# Patient Record
Sex: Female | Born: 1960
Health system: Southern US, Community
[De-identification: ages and names within clinical notes are randomized; demographics above are authoritative.]

## PROBLEM LIST (undated history)

## (undated) DIAGNOSIS — K2960 Other gastritis without bleeding: Secondary | ICD-10-CM

## (undated) DIAGNOSIS — T4145XA Adverse effect of unspecified anesthetic, initial encounter: Secondary | ICD-10-CM

## (undated) DIAGNOSIS — R600 Localized edema: Secondary | ICD-10-CM

## (undated) DIAGNOSIS — K219 Gastro-esophageal reflux disease without esophagitis: Secondary | ICD-10-CM

## (undated) DIAGNOSIS — T8859XA Other complications of anesthesia, initial encounter: Secondary | ICD-10-CM

## (undated) DIAGNOSIS — G43909 Migraine, unspecified, not intractable, without status migrainosus: Secondary | ICD-10-CM

## (undated) DIAGNOSIS — R7303 Prediabetes: Secondary | ICD-10-CM

## (undated) DIAGNOSIS — G4730 Sleep apnea, unspecified: Secondary | ICD-10-CM

## (undated) DIAGNOSIS — M1990 Unspecified osteoarthritis, unspecified site: Secondary | ICD-10-CM

## (undated) DIAGNOSIS — F419 Anxiety disorder, unspecified: Secondary | ICD-10-CM

## (undated) DIAGNOSIS — R519 Headache, unspecified: Secondary | ICD-10-CM

## (undated) DIAGNOSIS — F329 Major depressive disorder, single episode, unspecified: Secondary | ICD-10-CM

## (undated) DIAGNOSIS — F32A Depression, unspecified: Secondary | ICD-10-CM

## (undated) DIAGNOSIS — E119 Type 2 diabetes mellitus without complications: Secondary | ICD-10-CM

## (undated) HISTORY — DX: Headache, unspecified: R51.9

## (undated) HISTORY — PX: KNEE SURGERY: SHX244

## (undated) HISTORY — PX: JOINT REPLACEMENT: SHX530

## (undated) HISTORY — PX: FOOT SURGERY: SHX648

## (undated) HISTORY — DX: Other gastritis without bleeding: K29.60

## (undated) HISTORY — PX: MENISCUS REPAIR: SHX5179

## (undated) HISTORY — DX: Migraine, unspecified, not intractable, without status migrainosus: G43.909

## (undated) HISTORY — DX: Type 2 diabetes mellitus without complications: E11.9

---
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
## 1994-06-21 HISTORY — PX: ANTERIOR CRUCIATE LIGAMENT REPAIR: SHX115

## 2002-06-21 HISTORY — PX: DILATION AND CURETTAGE OF UTERUS: SHX78

## 2004-06-09 ENCOUNTER — Ambulatory Visit: Payer: Self-pay

## 2004-10-08 ENCOUNTER — Ambulatory Visit: Payer: Self-pay

## 2004-10-30 ENCOUNTER — Ambulatory Visit: Payer: Self-pay | Admitting: Internal Medicine

## 2005-07-20 ENCOUNTER — Ambulatory Visit: Payer: Self-pay | Admitting: Family Medicine

## 2006-07-21 DIAGNOSIS — I10 Essential (primary) hypertension: Secondary | ICD-10-CM | POA: Insufficient documentation

## 2006-07-21 DIAGNOSIS — F339 Major depressive disorder, recurrent, unspecified: Secondary | ICD-10-CM | POA: Insufficient documentation

## 2006-07-21 DIAGNOSIS — G43909 Migraine, unspecified, not intractable, without status migrainosus: Secondary | ICD-10-CM | POA: Insufficient documentation

## 2006-07-21 DIAGNOSIS — E7800 Pure hypercholesterolemia, unspecified: Secondary | ICD-10-CM | POA: Insufficient documentation

## 2006-07-21 DIAGNOSIS — F509 Eating disorder, unspecified: Secondary | ICD-10-CM | POA: Insufficient documentation

## 2006-07-28 DIAGNOSIS — M21619 Bunion of unspecified foot: Secondary | ICD-10-CM | POA: Insufficient documentation

## 2006-08-23 ENCOUNTER — Ambulatory Visit: Payer: Self-pay | Admitting: Family Medicine

## 2007-09-20 ENCOUNTER — Ambulatory Visit: Payer: Self-pay | Admitting: Family Medicine

## 2008-02-28 ENCOUNTER — Emergency Department: Payer: Self-pay | Admitting: Emergency Medicine

## 2008-12-18 DIAGNOSIS — M129 Arthropathy, unspecified: Secondary | ICD-10-CM | POA: Insufficient documentation

## 2010-01-14 LAB — HM PAP SMEAR: HM PAP: NEGATIVE

## 2010-01-19 LAB — LIPID PANEL
CHOLESTEROL: 141 mg/dL (ref 0–200)
HDL: 46 mg/dL (ref 35–70)
LDL Cholesterol: 76 mg/dL
TRIGLYCERIDES: 95 mg/dL (ref 40–160)

## 2010-01-20 ENCOUNTER — Ambulatory Visit: Payer: Self-pay | Admitting: Family Medicine

## 2011-09-13 ENCOUNTER — Ambulatory Visit: Payer: Self-pay | Admitting: Family Medicine

## 2011-10-11 LAB — HM COLONOSCOPY

## 2012-06-05 ENCOUNTER — Ambulatory Visit: Payer: Self-pay | Admitting: Family Medicine

## 2012-12-19 LAB — HEMOGLOBIN A1C: Hemoglobin A1C: 5.7

## 2013-05-24 ENCOUNTER — Ambulatory Visit: Payer: Self-pay | Admitting: Obstetrics and Gynecology

## 2013-05-24 LAB — CBC
HGB: 14.1 g/dL (ref 12.0–16.0)
MCH: 31.1 pg (ref 26.0–34.0)
MCHC: 34.2 g/dL (ref 32.0–36.0)
MCV: 91 fL (ref 80–100)
Platelet: 216 10*3/uL (ref 150–440)
WBC: 6.7 10*3/uL (ref 3.6–11.0)

## 2013-06-01 ENCOUNTER — Ambulatory Visit: Payer: Self-pay | Admitting: Obstetrics & Gynecology

## 2013-06-04 LAB — PATHOLOGY REPORT

## 2013-07-11 ENCOUNTER — Ambulatory Visit: Payer: Self-pay | Admitting: Family Medicine

## 2013-07-11 LAB — HM MAMMOGRAPHY

## 2014-04-15 ENCOUNTER — Ambulatory Visit: Payer: 59 | Admitting: Podiatry

## 2014-04-29 ENCOUNTER — Ambulatory Visit (INDEPENDENT_AMBULATORY_CARE_PROVIDER_SITE_OTHER): Payer: 59

## 2014-04-29 ENCOUNTER — Encounter: Payer: Self-pay | Admitting: Podiatry

## 2014-04-29 ENCOUNTER — Ambulatory Visit (INDEPENDENT_AMBULATORY_CARE_PROVIDER_SITE_OTHER): Payer: 59 | Admitting: Podiatry

## 2014-04-29 VITALS — BP 153/90 | HR 60 | Resp 16 | Ht 64.0 in | Wt 200.0 lb

## 2014-04-29 DIAGNOSIS — Q6652 Congenital pes planus, left foot: Secondary | ICD-10-CM

## 2014-04-29 DIAGNOSIS — M779 Enthesopathy, unspecified: Secondary | ICD-10-CM

## 2014-04-29 DIAGNOSIS — M722 Plantar fascial fibromatosis: Secondary | ICD-10-CM

## 2014-04-29 NOTE — Progress Notes (Signed)
   Subjective:    Patient ID: Rachael Brewer, female    DOB: 1961/04/28, 53 y.o.   MRN: [id number]  HPI Comments: Left foot pain   Foot Pain      Review of Systems     Objective:   Physical Exam: I have reviewed her past mental history medications allergy surgery social history and review of systems. Pulses are strongly palpable. Neurologic sensorium is intact persons once the monofilament. Deep tendon reflexes are intact bilateral muscle strength +5 over 5 dorsiflexion plantar flexors and inverters everters all into the musculature is intact. Orthopedic evaluation demonstrates all joints system to the angle full range of motion without crepitation. She has pain on palpation medial calcaneal tubercle of her left foot. She has pain on palpation to the peroneal tendons as well as the posterior tibial tendon with pes planus. Radiographic evaluation confirms plantar fasciitis.        Assessment & Plan:  Assessment:plantar fasciitis with pes planus and lateral compensatory syndrome left foot.  Plan: Kenalog injection today to the point of maximal tenderness will follow up with her in 1 month to 6 weeks.

## 2014-05-14 ENCOUNTER — Emergency Department: Payer: Self-pay | Admitting: Emergency Medicine

## 2014-06-12 ENCOUNTER — Ambulatory Visit: Payer: Self-pay | Admitting: Podiatry

## 2014-07-01 ENCOUNTER — Ambulatory Visit (INDEPENDENT_AMBULATORY_CARE_PROVIDER_SITE_OTHER): Payer: BLUE CROSS/BLUE SHIELD | Admitting: Podiatry

## 2014-07-01 ENCOUNTER — Encounter: Payer: Self-pay | Admitting: Podiatry

## 2014-07-01 VITALS — BP 112/80 | HR 66 | Resp 16

## 2014-07-01 DIAGNOSIS — M779 Enthesopathy, unspecified: Secondary | ICD-10-CM

## 2014-07-01 NOTE — Progress Notes (Signed)
She states that her left foot is considerably better however there is still some tenderness about the fifth metatarsal head of the left foot and right in here and she points to the insertion point of the tibialis anterior tendon left.  Objective: Pulses are strongly palpable left foot. She has mild tenderness on palpation medially. Tubercle of the left heel pain on palpation fifth metatarsal head left. She also has pain on palpation of the insertion site of the tibialis anterior as well as the tibialis posterior tendons.  Assessment: Insertional posterior tibial tendinitis secondary to plantar fasciitis.  Plan: Injected a small amount of dexamethasone to the point of maximal tenderness at the tibialis anterior insertion site. Discussed ice therapy shoe gear modifications and purchasing a new pair of shoes. She understands this is amenable to it.

## 2014-08-12 ENCOUNTER — Encounter: Payer: Self-pay | Admitting: Podiatry

## 2014-08-12 ENCOUNTER — Ambulatory Visit (INDEPENDENT_AMBULATORY_CARE_PROVIDER_SITE_OTHER): Payer: BLUE CROSS/BLUE SHIELD

## 2014-08-12 ENCOUNTER — Ambulatory Visit (INDEPENDENT_AMBULATORY_CARE_PROVIDER_SITE_OTHER): Payer: BLUE CROSS/BLUE SHIELD | Admitting: Podiatry

## 2014-08-12 VITALS — BP 159/88 | HR 62 | Resp 12

## 2014-08-12 DIAGNOSIS — G5751 Tarsal tunnel syndrome, right lower limb: Secondary | ICD-10-CM

## 2014-08-12 DIAGNOSIS — R52 Pain, unspecified: Secondary | ICD-10-CM

## 2014-08-12 NOTE — Progress Notes (Signed)
   Subjective:    Patient ID: Rachael Brewer, female    DOB: 07-Aug-1960, 54 y.o.   MRN: [id number]  HPI  PT STATED RT TOP OF THE FOOT BEEN SORE FOR 1 MONTH. THE FOOT IS GETTING WORSE ESPECIALLY WHEN FLEXING/ WALKING. TRIED ICE BUT NO HELP.  Review of Systems  Musculoskeletal: Positive for gait problem.  All other systems reviewed and are negative.      Objective:   Physical Exam: I have reviewed her past history medications allergies surgeries and social history. Pulses are palpable bilateral lower extremity. She has pain with a positive Tinel's sign on palpation of the tibial nerve all palpating the posterior tibial tendon. This is primarily in the right foot she does have some lateral compensatory syndrome of the left foot from proximal plantar fasciitis.      Assessment & Plan:  Assessment: Proximal plantar fasciitis bilateral right greater than left. Lateral compensatory syndrome bilaterally. This was resulting in a tarsal tunnellike syndrome in the right foot.  Plan: We discussed the necessity for new orthotics upon her next visit should should a new pair of shoes not alleviate her symptoms entirely. I also injected the tarsal tunnel at the porta pedis today. This was on the right foot only

## 2014-09-11 ENCOUNTER — Ambulatory Visit (INDEPENDENT_AMBULATORY_CARE_PROVIDER_SITE_OTHER): Payer: BLUE CROSS/BLUE SHIELD | Admitting: Podiatry

## 2014-09-11 ENCOUNTER — Encounter: Payer: Self-pay | Admitting: Podiatry

## 2014-09-11 DIAGNOSIS — G5751 Tarsal tunnel syndrome, right lower limb: Secondary | ICD-10-CM | POA: Diagnosis not present

## 2014-09-11 DIAGNOSIS — Q6650 Congenital pes planus, unspecified foot: Secondary | ICD-10-CM

## 2014-09-11 DIAGNOSIS — M722 Plantar fascial fibromatosis: Secondary | ICD-10-CM

## 2014-09-12 NOTE — Progress Notes (Signed)
She presents today complaining of generalized foot pain and discomfort though the areas of greatest concern last time the tarsal tunnel of the right foot in the arch of the left foot is doing much better.  Objective: Vital signs are stable she is alert and oriented 3 no reproducible pain today with exception of 2 small, very small, fibromas to plantar medial aspect of the forefoot bilateral.  Assessment: Fasciitis with a plantar fibroma bilateral with lateral compensatory syndrome.  Plan: She was scanned for another pair orthotics and I will follow up with her once the cement.

## 2014-10-11 NOTE — Op Note (Signed)
PATIENT NAME:  Rachael Brewer, DIFFEE MR#:  [id number] DATE OF BIRTH:  1961-06-10  DATE OF PROCEDURE:  06/01/2013  PREOPERATIVE DIAGNOSIS: Postmenopausal bleeding and intrauterine mass.   POSTOPERATIVE DAIGNOSIS: Postmenopausal bleeding and intrauterine mass.   PROCEDURE PERFORMED: Hysteroscopy, dilation and curettage procedure with myomectomy/excision of mass.   SURGEON: Barnett Applebaum, M.D.   ANESTHESIA: General.   ESTIMATED BLOOD LOSS: Minimal.   COMPLICATIONS: None.   FINDINGS: There is a fundal left-sided of the submucosal swelling or mass. Specimen is obtained by MyoSure extraction of this tissue in question.   DISPOSITION: To the recovery room in stable condition.   TECHNIQUE: The patient is prepped in the usual sterile fashion after adequate anesthesia is obtained in the dorsal lithotomy position. Bladder is drained with a Robinson catheter. Speculum is placed and the anterior lip of the cervix is grasped with a tenaculum. The cervix is gently dilated and the uterus is sounded to 6 cm. A 0 degree hysteroscope is inserted with the above-mentioned findings visualized. Using the MyoSure extraction device, specimen is excised and retrieved and sent to pathology for further review. Excellent hemostasis is noted in the lining of the uterus. The hysteroscope was removed with minimal discrepancy of fluid. The tenaculum was removed with no bleeding noted. The patient goes to the recovery room in stable condition. All sponge, instrument, and needle counts are correct.   ____________________________ R. Barnett Applebaum, MD rph:aw D: 06/01/2013 13:04:05 ET T: 06/01/2013 13:31:32 ET JOB#: [id number]  cc: Glean Salen, MD, <Dictator> Gae Dry MD ELECTRONICALLY SIGNED 06/05/2013 10:06

## 2014-10-14 ENCOUNTER — Ambulatory Visit (INDEPENDENT_AMBULATORY_CARE_PROVIDER_SITE_OTHER): Payer: BLUE CROSS/BLUE SHIELD | Admitting: *Deleted

## 2014-10-14 DIAGNOSIS — G5751 Tarsal tunnel syndrome, right lower limb: Secondary | ICD-10-CM

## 2014-10-14 DIAGNOSIS — M722 Plantar fascial fibromatosis: Secondary | ICD-10-CM

## 2014-10-14 NOTE — Progress Notes (Signed)
Orthotics dispensed. Given written and oral breakin instructions.

## 2014-10-14 NOTE — Patient Instructions (Signed)

## 2014-11-11 ENCOUNTER — Encounter: Payer: Self-pay | Admitting: Podiatry

## 2014-11-11 ENCOUNTER — Ambulatory Visit (INDEPENDENT_AMBULATORY_CARE_PROVIDER_SITE_OTHER): Payer: BLUE CROSS/BLUE SHIELD | Admitting: Podiatry

## 2014-11-11 DIAGNOSIS — M722 Plantar fascial fibromatosis: Secondary | ICD-10-CM

## 2014-11-11 DIAGNOSIS — Q6652 Congenital pes planus, left foot: Secondary | ICD-10-CM | POA: Diagnosis not present

## 2014-11-11 DIAGNOSIS — M7750 Other enthesopathy of unspecified foot: Secondary | ICD-10-CM | POA: Diagnosis not present

## 2014-11-11 DIAGNOSIS — M778 Other enthesopathies, not elsewhere classified: Secondary | ICD-10-CM

## 2014-11-11 DIAGNOSIS — M779 Enthesopathy, unspecified: Secondary | ICD-10-CM

## 2014-11-11 NOTE — Progress Notes (Signed)
She presents today 1 month after picking up her new orthotics. She states that her feet are still painful particularly through the medial longitudinal arch from plantar to dorsal around the lateral aspect of the foot and ankle. She denies any shooting pain that she had in the past and she denies any pain around the ankles that was once consistent with tarsal tunnel syndrome.  Objective: Vital signs are stable to alert and oriented 3. Pulses are strongly palpable bilateral. Moderate to severe edema to the legs and dorsal aspect of the foot today this is relatively new. She states that she will be following up with her primary doctor this week. She has reproducible pain on palpation and frontal plane range of motion of the medial longitudinal arch and midfoot. Minimal tenderness on palpation of the medial calcaneal tubercles bilateral.  Assessment: Capsulitis plantar fasciitis osteoarthritis possible neuropathy bilateral.  Plan: I'm encouraging her to follow-up with her primary doctor to evaluate the edema bilateral. Also request that she follow-up with Dr. Jacqualyn Posey just to see if he can add insight to this chronic pain. I encouraged her to wear her orthotics regularly as she follows up with him.

## 2014-11-15 LAB — TSH: TSH: 2.18 u[IU]/mL (ref 0.41–5.90)

## 2014-11-15 LAB — CBC AND DIFFERENTIAL
HCT: 43 % (ref 36–46)
HEMOGLOBIN: 15.2 g/dL (ref 12.0–16.0)
Platelets: 266 10*3/uL (ref 150–399)
WBC: 6.1 10^3/mL

## 2014-11-15 LAB — BASIC METABOLIC PANEL
Creatinine: 0.9 mg/dL (ref 0.5–1.1)
GLUCOSE: 95 mg/dL

## 2014-11-28 ENCOUNTER — Ambulatory Visit (INDEPENDENT_AMBULATORY_CARE_PROVIDER_SITE_OTHER): Payer: BLUE CROSS/BLUE SHIELD | Admitting: Podiatry

## 2014-11-28 ENCOUNTER — Encounter: Payer: Self-pay | Admitting: Podiatry

## 2014-11-28 ENCOUNTER — Telehealth: Payer: Self-pay | Admitting: Family Medicine

## 2014-11-28 DIAGNOSIS — M722 Plantar fascial fibromatosis: Secondary | ICD-10-CM

## 2014-11-28 DIAGNOSIS — M76829 Posterior tibial tendinitis, unspecified leg: Secondary | ICD-10-CM | POA: Diagnosis not present

## 2014-11-28 DIAGNOSIS — R609 Edema, unspecified: Secondary | ICD-10-CM | POA: Insufficient documentation

## 2014-11-28 NOTE — Telephone Encounter (Signed)
Left message advising pt as below.   Thanks,   -Mickel Baas

## 2014-11-28 NOTE — Telephone Encounter (Signed)
Pt stated she needs to have her labs done again and would like to pick up a lab slip. Pt would like a call when it is ready to be picked up. Thanks TNP

## 2014-11-28 NOTE — Telephone Encounter (Signed)
Ordered sent to lab. Please notify patient.

## 2014-12-02 DIAGNOSIS — M722 Plantar fascial fibromatosis: Secondary | ICD-10-CM | POA: Insufficient documentation

## 2014-12-02 DIAGNOSIS — M76829 Posterior tibial tendinitis, unspecified leg: Secondary | ICD-10-CM | POA: Insufficient documentation

## 2014-12-02 NOTE — Progress Notes (Signed)
Patient ID: Rachael Brewer, female   DOB: 09-20-1960, 54 y.o.   MRN: [id number]  Subjective: 54 year old female presents to the office today for a second opinion on bilateral heel pain. She states that she has had heel pain ongoing for several months without much relief. She does that since the orthotics she does continue to have pain although the orthotist to help some. She has a states that she has pain in the inside aspect of her ankle and his been ongoing approximately the same amount of time. She denies any numbness or tingling. Denies any swelling or redness. The pain does not wake her up at night. Denies any acute injury or trauma to the area change or increase in activity the time of onset of symptoms. She has previously had stretching, icing activities daily. She has also previously had steroid anti-inflammatories medications without resolution. No other complaints at this time.  Objective: AAO x3, NAD DP/PT pulses palpable bilaterally, CRT less than 3 seconds Protective sensation intact with Simms Weinstein monofilament, vibratory sensation intact, Achilles tendon reflex intact; negative tinel sign  There is tenderness to palpation overlying the plantar medial tubercle of the calcaneus to bilateral heels at the insertion of the plantar fascia. There is no pain along the course of plantar fascial within the arch of the foot and the plantar fascia appears intact. There is no pain with lateral compression of the calcaneus or pain the vibratory sensation. No pain on the posterior aspect of the calcaneus or along the course/insertion of the Achilles tendon. There is tenderness to palpation on the course of posterior tibial tendon posterior, inferior to the medial malleolus and the insertion of the navicular tuberosity. There is a decrease in medial arch height upon weightbearing with equinus present. She is able to perform a single and double heel rise although it is uncomfortable. There is no  overlying edema, erythema, increase in warmth. No other areas of tenderness palpation or pain with vibratory sensation to the foot/ankle. MMT 5/5, ROM WNL No open lesions or pre-ulcerative lesions are identified. No pain with calf compression, swelling, warmth, erythema.  Assessment: Bilateral chronic heel pain; likely plantar fasciitis although cannot exclude other etiologies. Unlikely tarsal tunnel   Plan: -X-rays were reviewed with the patient.  -Treatment options discussed including all alternatives, risks, and complications -At this appointment Betha (orthotist) also evaluated the patient to see if the orthotics for fitting appropriately. She also believes of the patient may benefit from an AFO to help stabilize her ankle help take some pressure off the posterior tibial tendon and help the heel pain as well. The patient was open to this and would possibly consider brace in the future after she talks her husband. -Discussed MRI however patient was told off on that at this time. -Recommended physical therapy. Prescription for this was given to the patient. -Continue stretching, icing activities on a consistent basis. Negative barefoot. Wear orthotics. -Follow-up after PT or sooner if any problems are to arise. In the meantime I encouraged her to call the office with any questions, concerns, change in symptoms.

## 2014-12-04 ENCOUNTER — Telehealth: Payer: Self-pay

## 2014-12-04 DIAGNOSIS — R609 Edema, unspecified: Secondary | ICD-10-CM

## 2014-12-04 NOTE — Telephone Encounter (Signed)
Printed new lab slip.

## 2014-12-06 ENCOUNTER — Telehealth: Payer: Self-pay

## 2014-12-06 LAB — COMPREHENSIVE METABOLIC PANEL
A/G RATIO: 1.7 (ref 1.1–2.5)
ALT: 14 IU/L (ref 0–32)
AST: 18 IU/L (ref 0–40)
Albumin: 4.3 g/dL (ref 3.5–5.5)
Alkaline Phosphatase: 54 IU/L (ref 39–117)
BUN/Creatinine Ratio: 24 — ABNORMAL HIGH (ref 9–23)
BUN: 21 mg/dL (ref 6–24)
Bilirubin Total: 0.3 mg/dL (ref 0.0–1.2)
CALCIUM: 9.2 mg/dL (ref 8.7–10.2)
CO2: 24 mmol/L (ref 18–29)
CREATININE: 0.88 mg/dL (ref 0.57–1.00)
Chloride: 107 mmol/L (ref 97–108)
GFR calc Af Amer: 86 mL/min/{1.73_m2} (ref >59)
GFR, EST NON AFRICAN AMERICAN: 74 mL/min/{1.73_m2} (ref >59)
GLOBULIN, TOTAL: 2.6 g/dL (ref 1.5–4.5)
GLUCOSE: 95 mg/dL (ref 65–99)
Potassium: 4.5 mmol/L (ref 3.5–5.2)
Sodium: 143 mmol/L (ref 134–144)
TOTAL PROTEIN: 6.9 g/dL (ref 6.0–8.5)

## 2014-12-06 NOTE — Telephone Encounter (Signed)
-----   Message from Margarita Rana, MD sent at 12/06/2014  9:54 AM EDT ----- Normal labs. Please notify patient. Thanks.

## 2014-12-06 NOTE — Telephone Encounter (Signed)
Advised pt of lab results. Pt verbally acknowledges understanding. Lemont Sitzmann Drozdowski, CMA   

## 2015-01-06 ENCOUNTER — Other Ambulatory Visit: Payer: Self-pay | Admitting: Family Medicine

## 2015-01-06 DIAGNOSIS — R609 Edema, unspecified: Secondary | ICD-10-CM

## 2015-05-28 ENCOUNTER — Other Ambulatory Visit: Payer: Self-pay | Admitting: Family Medicine

## 2015-05-28 DIAGNOSIS — R609 Edema, unspecified: Secondary | ICD-10-CM

## 2015-06-10 ENCOUNTER — Ambulatory Visit (INDEPENDENT_AMBULATORY_CARE_PROVIDER_SITE_OTHER): Payer: BLUE CROSS/BLUE SHIELD | Admitting: Family Medicine

## 2015-06-10 ENCOUNTER — Telehealth: Payer: Self-pay | Admitting: Family Medicine

## 2015-06-10 ENCOUNTER — Encounter: Payer: Self-pay | Admitting: Family Medicine

## 2015-06-10 VITALS — BP 112/60 | HR 76 | Temp 99.1°F | Resp 16 | Wt 223.0 lb

## 2015-06-10 DIAGNOSIS — E669 Obesity, unspecified: Secondary | ICD-10-CM | POA: Insufficient documentation

## 2015-06-10 DIAGNOSIS — R5383 Other fatigue: Secondary | ICD-10-CM | POA: Insufficient documentation

## 2015-06-10 DIAGNOSIS — H6693 Otitis media, unspecified, bilateral: Secondary | ICD-10-CM

## 2015-06-10 DIAGNOSIS — R739 Hyperglycemia, unspecified: Secondary | ICD-10-CM | POA: Insufficient documentation

## 2015-06-10 DIAGNOSIS — S39012A Strain of muscle, fascia and tendon of lower back, initial encounter: Secondary | ICD-10-CM | POA: Insufficient documentation

## 2015-06-10 DIAGNOSIS — J0100 Acute maxillary sinusitis, unspecified: Secondary | ICD-10-CM | POA: Diagnosis not present

## 2015-06-10 DIAGNOSIS — J45909 Unspecified asthma, uncomplicated: Secondary | ICD-10-CM | POA: Insufficient documentation

## 2015-06-10 DIAGNOSIS — K219 Gastro-esophageal reflux disease without esophagitis: Secondary | ICD-10-CM | POA: Insufficient documentation

## 2015-06-10 DIAGNOSIS — N950 Postmenopausal bleeding: Secondary | ICD-10-CM | POA: Insufficient documentation

## 2015-06-10 DIAGNOSIS — H8110 Benign paroxysmal vertigo, unspecified ear: Secondary | ICD-10-CM | POA: Insufficient documentation

## 2015-06-10 DIAGNOSIS — J309 Allergic rhinitis, unspecified: Secondary | ICD-10-CM | POA: Insufficient documentation

## 2015-06-10 DIAGNOSIS — J3489 Other specified disorders of nose and nasal sinuses: Secondary | ICD-10-CM | POA: Insufficient documentation

## 2015-06-10 MED ORDER — PREDNISONE 10 MG PO TABS: ORAL_TABLET | ORAL | Status: DC

## 2015-06-10 MED ORDER — PREDNISONE 10 MG PO TABS
ORAL_TABLET | ORAL | Status: DC
Start: 2015-06-10 — End: 2015-06-10

## 2015-06-10 MED ORDER — CLARITHROMYCIN 500 MG PO TABS: 500.0000 mg | ORAL_TABLET | Freq: Two times a day (BID) | ORAL | Status: DC

## 2015-06-10 NOTE — Progress Notes (Signed)
Patient ID: Rachael Brewer, female   DOB: [birth year], 54 y.o.   MRN: [id number]          Patient: Rachael Brewer Female    DOB: 12-19-1960   54 y.o.   MRN: [id number] Visit Date: 06/10/2015  Today's Provider: Margarita Rana, MD   Chief Complaint  Patient presents with  . Sinusitis   Subjective:    Sinusitis This is a new problem. The current episode started 1 to 4 weeks ago (over a week ago.  Everyone in house is sick. ). The problem has been gradually worsening since onset. There has been no fever. Associated symptoms include congestion, coughing, ear pain, headaches, sinus pressure and a sore throat. Pertinent negatives include no chills, diaphoresis, shortness of breath or sneezing. (Has had some wheezing. Has been using her inhaler.  Also, has been having some ear pain.  )       Allergies  Allergen Reactions  . Sulfa Antibiotics Anaphylaxis  . Penicillins Hives  . Sumatriptan    Previous Medications   ASPIRIN-ACETAMINOPHEN-CAFFEINE (EXCEDRIN MIGRAINE) 250-250-65 MG TABLET    Take by mouth.   AZELASTINE HCL 0.15 % SOLN    Place into the nose.   BACLOFEN (LIORESAL) 10 MG TABLET       CHOLECALCIFEROL 1000 UNITS TABLET    Take by mouth.   ESCITALOPRAM (LEXAPRO) 5 MG TABLET    Take by mouth.   FLUTICASONE (FLONASE) 50 MCG/ACT NASAL SPRAY    Place into the nose.   GABAPENTIN (NEURONTIN) 600 MG TABLET       OMEPRAZOLE 20 MG TBEC    Take by mouth.   POTASSIUM CHLORIDE (MICRO-K) 10 MEQ CR CAPSULE    take 1 capsule by mouth once daily   RIZATRIPTAN (MAXALT) 10 MG TABLET       TOPIRAMATE (TOPAMAX) 200 MG TABLET       TORSEMIDE (DEMADEX) 20 MG TABLET    take 1 tablet by mouth once daily   VENTOLIN HFA 108 (90 BASE) MCG/ACT INHALER        Review of Systems  Constitutional: Positive for fatigue. Negative for fever, chills, diaphoresis, activity change, appetite change and unexpected weight change.  HENT: Positive for congestion, ear pain, nosebleeds, postnasal drip, sinus  pressure, sore throat and tinnitus. Negative for ear discharge, rhinorrhea, sneezing, trouble swallowing and voice change.   Eyes: Negative for photophobia, pain, discharge, redness, itching and visual disturbance.  Respiratory: Positive for cough and wheezing. Negative for apnea, choking, chest tightness, shortness of breath and stridor.   Gastrointestinal: Negative.   Neurological: Positive for headaches. Negative for dizziness and light-headedness.    Social History  Substance Use Topics  . Smoking status: Former Smoker    Quit date: 06/21/1999  . Smokeless tobacco: Never Used  . Alcohol Use: 0.0 oz/week    0 Standard drinks or equivalent per week     Comment: occasional   Objective:   BP 112/60 mmHg  Pulse 76  Temp(Src) 99.1 F (37.3 C) (Oral)  Resp 16  Wt 223 lb (101.152 kg)  Physical Exam  HENT:  Head: Normocephalic and atraumatic.  Right Ear: Tympanic membrane is erythematous and bulging.  Left Ear: Tympanic membrane is erythematous and bulging.  Nose: Mucosal edema present.  Mouth/Throat: Uvula is midline.  Cardiovascular: Normal rate, regular rhythm and normal heart sounds.   Pulmonary/Chest: Effort normal and breath sounds normal.  Psychiatric: She has a normal mood and affect. Her behavior is normal. Judgment and thought content  normal.      Assessment & Plan:      1. Acute maxillary sinusitis, recurrence not specified New problem. Does have history of asthma.  Will continue inhaler, add prednisone due to wheezing and start antibiotic.  - predniSONE (DELTASONE) 10 MG tablet; 6 Tablets for one day 5 Tablets for one day 4 Tablets for one day 3 Tablets for one day 2 Tablets for one day 1 Tablet for one day  Dispense: 21 tablet; Refill: 0  2. Acute ear infection, bilateral New problem. Worsening.  Will treat.  Patient instructed to call back if condition worsens or does not improve.    - clarithromycin (BIAXIN) 500 MG tablet; Take 1 tablet (500 mg total) by mouth 2  (two) times daily.  Dispense: 20 tablet; Refill: 0  Patient was seen and examined by Jerrell Belfast, MD, and note scribed by Ashley Royalty, CMA.  I have reviewed the document for accuracy and completeness and I agree with above. - Jerrell Belfast, MD      Margarita Rana, MD  [facility]

## 2015-06-10 NOTE — Telephone Encounter (Signed)
Called pt and scheduled grandson for this afternoon with Dr. Venia Minks. Thanks TNP

## 2015-06-10 NOTE — Telephone Encounter (Signed)
Ok to schedule. Thanks

## 2015-06-10 NOTE — Telephone Encounter (Signed)
Pt now has custody of her 55 year old grandson. Insurance : Medicaid. Name: Rachael Brewer. DOB: 07/27/2008. Pt is coming in this afternoon for an acute visit and would like to make an appt for her grandson because he has cough & congestion as well. You have a 3:30 same day open and your 3:45 is on hold. Can I schedule grandson for this afternoon to establish and treat for acute visit? Please advise. Thanks TNP

## 2015-07-28 ENCOUNTER — Other Ambulatory Visit: Payer: Self-pay

## 2015-07-28 DIAGNOSIS — R609 Edema, unspecified: Secondary | ICD-10-CM

## 2015-07-28 MED ORDER — TORSEMIDE 20 MG PO TABS: 20.0000 mg | ORAL_TABLET | Freq: Every day | ORAL | Status: DC

## 2015-07-28 NOTE — Telephone Encounter (Signed)
Pharmacy requesting refill. Patient's last OV was 06/10/2015. Med last filled on 01/06/2015 #30 with 5 refills.

## 2015-10-06 DIAGNOSIS — F331 Major depressive disorder, recurrent, moderate: Secondary | ICD-10-CM | POA: Diagnosis not present

## 2015-10-28 DIAGNOSIS — M791 Myalgia: Secondary | ICD-10-CM | POA: Diagnosis not present

## 2015-10-28 DIAGNOSIS — G43019 Migraine without aura, intractable, without status migrainosus: Secondary | ICD-10-CM | POA: Diagnosis not present

## 2015-10-28 DIAGNOSIS — G518 Other disorders of facial nerve: Secondary | ICD-10-CM | POA: Diagnosis not present

## 2015-10-28 DIAGNOSIS — G43719 Chronic migraine without aura, intractable, without status migrainosus: Secondary | ICD-10-CM | POA: Diagnosis not present

## 2015-10-28 DIAGNOSIS — M542 Cervicalgia: Secondary | ICD-10-CM | POA: Diagnosis not present

## 2015-10-28 DIAGNOSIS — G43839 Menstrual migraine, intractable, without status migrainosus: Secondary | ICD-10-CM | POA: Diagnosis not present

## 2015-11-04 DIAGNOSIS — F331 Major depressive disorder, recurrent, moderate: Secondary | ICD-10-CM | POA: Diagnosis not present

## 2015-12-11 DIAGNOSIS — F331 Major depressive disorder, recurrent, moderate: Secondary | ICD-10-CM | POA: Diagnosis not present

## 2015-12-15 ENCOUNTER — Encounter: Payer: Self-pay | Admitting: Family Medicine

## 2015-12-15 ENCOUNTER — Ambulatory Visit (INDEPENDENT_AMBULATORY_CARE_PROVIDER_SITE_OTHER): Payer: BLUE CROSS/BLUE SHIELD | Admitting: Family Medicine

## 2015-12-15 VITALS — BP 122/72 | HR 72 | Temp 98.1°F | Resp 16 | Ht 64.0 in | Wt 233.0 lb

## 2015-12-15 DIAGNOSIS — R609 Edema, unspecified: Secondary | ICD-10-CM

## 2015-12-15 DIAGNOSIS — I10 Essential (primary) hypertension: Secondary | ICD-10-CM | POA: Diagnosis not present

## 2015-12-15 DIAGNOSIS — R739 Hyperglycemia, unspecified: Secondary | ICD-10-CM | POA: Diagnosis not present

## 2015-12-15 DIAGNOSIS — R0683 Snoring: Secondary | ICD-10-CM

## 2015-12-15 DIAGNOSIS — Z0000 Encounter for general adult medical examination without abnormal findings: Secondary | ICD-10-CM

## 2015-12-15 DIAGNOSIS — Z124 Encounter for screening for malignant neoplasm of cervix: Secondary | ICD-10-CM | POA: Diagnosis not present

## 2015-12-15 DIAGNOSIS — E7800 Pure hypercholesterolemia, unspecified: Secondary | ICD-10-CM

## 2015-12-15 DIAGNOSIS — Z1159 Encounter for screening for other viral diseases: Secondary | ICD-10-CM | POA: Diagnosis not present

## 2015-12-15 DIAGNOSIS — Z1239 Encounter for other screening for malignant neoplasm of breast: Secondary | ICD-10-CM

## 2015-12-15 LAB — POCT URINALYSIS DIPSTICK
BILIRUBIN UA: NEGATIVE
Glucose, UA: NEGATIVE
KETONES UA: NEGATIVE
LEUKOCYTES UA: NEGATIVE
Nitrite, UA: NEGATIVE
PROTEIN UA: NEGATIVE
RBC UA: NEGATIVE
SPEC GRAV UA: 1.01
Urobilinogen, UA: 0.2
pH, UA: 7.5

## 2015-12-15 MED ORDER — POTASSIUM CHLORIDE ER 10 MEQ PO CPCR: 20.0000 meq | ORAL_CAPSULE | Freq: Every day | ORAL | Status: DC

## 2015-12-15 MED ORDER — TORSEMIDE 20 MG PO TABS: 40.0000 mg | ORAL_TABLET | Freq: Once | ORAL | Status: DC

## 2015-12-15 NOTE — Patient Instructions (Signed)
Please call the [facility] at [facility] to schedule this at [phone]   

## 2015-12-15 NOTE — Progress Notes (Signed)
Patient: Rachael Brewer, Female    DOB: August 12, 1960, 55 y.o.   MRN: [id number] Visit Date: 12/15/2015  Today's Provider: Margarita Rana, MD   Chief Complaint  Patient presents with  . Annual Exam   Subjective:    Annual physical exam Rachael Brewer is a 55 y.o. female who presents today for health maintenance and complete physical. She feels well. She reports exercising none. She reports she is sleeping fairly well. 01/14/10 Pap-neg; HPV-neg 07/11/13 Mammogram-BI-RADS 1 ----------------------------------------------------------------- Edema: Patient complains of edema. The location of the edema is lower leg(s) bilateral.  The edema has been moderate.  Onset of symptoms was several years ago, gradually worsening since that time. The edema is present all day and and worse in the evening. The patient states the problem is long-standing.  The swelling has been aggravated by increased salt intake, relieved by low-salt diet, and been associated with nothing. Cardiac risk factors include none. Patient reports taking Torsemide 20 mg daily.    Review of Systems  Constitutional: Positive for fatigue.  HENT: Positive for nosebleeds.   Eyes: Negative.   Respiratory: Positive for apnea and wheezing.   Cardiovascular: Positive for leg swelling.  Gastrointestinal: Negative.   Endocrine: Negative.   Genitourinary: Negative.   Musculoskeletal: Positive for arthralgias.  Skin: Negative.   Allergic/Immunologic: Negative.   Neurological: Positive for headaches.  Hematological: Negative.   Psychiatric/Behavioral: Negative.     Social History      She  reports that she quit smoking about 16 years ago. She has never used smokeless tobacco. She reports that she drinks alcohol. She reports that she does not use illicit drugs.       Social History   Social History  . Marital Status: Married    Spouse Name: N/A  . Number of Children: N/A  . Years of Education: N/A   Social History  Main Topics  . Smoking status: Former Smoker    Quit date: 06/21/1999  . Smokeless tobacco: Never Used  . Alcohol Use: 0.0 oz/week    0 Standard drinks or equivalent per week     Comment: occasional  . Drug Use: No  . Sexual Activity: Not Asked   Other Topics Concern  . None   Social History Narrative    Past Medical History  Diagnosis Date  . Migraines      Patient Active Problem List   Diagnosis Date Noted  . AB (asthmatic bronchitis) 06/10/2015  . Adult-onset obesity 06/10/2015  . Allergic rhinitis 06/10/2015  . Benign paroxysmal positional nystagmus 06/10/2015  . Fatigue 06/10/2015  . Acid reflux 06/10/2015  . Blood glucose elevated 06/10/2015  . Lesion of nose 06/10/2015  . Low back strain 06/10/2015  . Hemorrhage, postmenopausal 06/10/2015  . Plantar fasciitis 12/02/2014  . Posterior tibial tendonitis 12/02/2014  . Edema 11/28/2014  . Arthropathia 12/18/2008  . Bunion 07/28/2006  . Clinical depression 07/21/2006  . Appetite disorder 07/21/2006  . Hypercholesteremia 07/21/2006  . BP (high blood pressure) 07/21/2006  . Headache, migraine 07/21/2006    Past Surgical History  Procedure Laterality Date  . Knee surgery Right   . Foot surgery    . Dilation and curettage of uterus  2004  . Anterior cruciate ligament repair Right 1996    Family History        Family Status  Relation Status Death Age  . Mother Alive   . Father Alive   . Brother Alive  Her family history includes Diabetes in her brother and other; Glaucoma in her other; Hypertension in her brother and other.    Allergies  Allergen Reactions  . Sulfa Antibiotics Anaphylaxis  . Penicillins Hives  . Sumatriptan     Current Meds  Medication Sig  . aspirin-acetaminophen-caffeine (EXCEDRIN MIGRAINE) 250-250-65 MG tablet Take by mouth.  . Azelastine HCl 0.15 % SOLN Place into the nose.  . baclofen (LIORESAL) 10 MG tablet Take 10 mg by mouth as needed.   . Cholecalciferol 1000  UNITS tablet Take by mouth.  . fluticasone (FLONASE) 50 MCG/ACT nasal spray Place into the nose.  . potassium chloride (MICRO-K) 10 MEQ CR capsule Take 2 capsules (20 mEq total) by mouth daily.  Marland Kitchen torsemide (DEMADEX) 20 MG tablet Take 2 tablets (40 mg total) by mouth once.  . VENTOLIN HFA 108 (90 BASE) MCG/ACT inhaler   . [DISCONTINUED] potassium chloride (MICRO-K) 10 MEQ CR capsule take 1 capsule by mouth once daily  . [DISCONTINUED] torsemide (DEMADEX) 20 MG tablet Take 1 tablet (20 mg total) by mouth daily.    Patient Care Team: Margarita Rana, MD as PCP - General (Family Medicine)     Objective:   Vitals: BP 122/72 mmHg  Pulse 72  Temp(Src) 98.1 F (36.7 C) (Oral)  Resp 16  Ht 5\' 4"  (1.626 m)  Wt 233 lb (105.688 kg)  BMI 39.97 kg/m2  LMP  (LMP Unknown)   Physical Exam  Constitutional: She is oriented to person, place, and time. She appears well-developed and well-nourished.  HENT:  Head: Normocephalic and atraumatic.  Right Ear: Tympanic membrane, external ear and ear canal normal.  Left Ear: Tympanic membrane, external ear and ear canal normal.  Nose: Nose normal.  Mouth/Throat: Uvula is midline, oropharynx is clear and moist and mucous membranes are normal.  Eyes: Conjunctivae, EOM and lids are normal. Pupils are equal, round, and reactive to light.  Neck: Trachea normal and normal range of motion. Neck supple. Carotid bruit is not present. No thyroid mass and no thyromegaly present.  Cardiovascular: Normal rate, regular rhythm and normal heart sounds.   Pulmonary/Chest: Effort normal and breath sounds normal.  Abdominal: Soft. Normal appearance and bowel sounds are normal. There is no hepatosplenomegaly. There is no tenderness.  Genitourinary: No breast swelling, tenderness or discharge.  Musculoskeletal: Normal range of motion.  Lymphadenopathy:    She has no cervical adenopathy.    She has no axillary adenopathy.  Neurological: She is alert and oriented to person,  place, and time. She has normal strength. No cranial nerve deficit.  Skin: Skin is warm, dry and intact.  Psychiatric: She has a normal mood and affect. Her speech is normal and behavior is normal. Judgment and thought content normal. Cognition and memory are normal.     Depression Screen PHQ 2/9 Scores 12/15/2015  PHQ - 2 Score 3  PHQ- 9 Score 12      Assessment & Plan:     Routine Health Maintenance and Physical Exam  Exercise Activities and Dietary recommendations Goals    . Exercise 150 minutes per week (moderate activity)       Immunization History  Administered Date(s) Administered  . Td 07/22/2000   --------------------------------------------------------------------  1. Annual physical exam Stable. Patient advised to continue eating healthy and exercise daily. - POCT urinalysis dipstick  2. Edema, unspecified type Recurrent. Worsening. Toresmide increased to 40 mg daily and potassium chloride to 20 meq daily as below. Patient to schedule follow-up in 2 weeks  with Nechama Guard. Follow-up on medication and labs. - torsemide (DEMADEX) 20 MG tablet; Take 2 tablets (40 mg total) by mouth once.  Dispense: 60 tablet; Refill: 5 - potassium chloride (MICRO-K) 10 MEQ CR capsule; Take 2 capsules (20 mEq total) by mouth daily.  Dispense: 60 capsule; Refill: 5  3. Cervical cancer screening F/U pending lab report. - Pap IG and HPV (high risk) DNA detection  4. Breast cancer screening - MM DIGITAL SCREENING BILATERAL; Future  5. Essential hypertension F/U pending lab report. - CBC with Differential/Platelet - Comprehensive metabolic panel  6. Need for hepatitis C screening test Patient seen and examined by Dr. Jerrell Belfast, and note scribed by Philbert Riser. Dimas, CMA. - Hepatitis C antibody  7. Hypercholesteremia F/U pending lab report. - Lipid Panel With LDL/HDL Ratio - TSH  8. Blood glucose elevated F/U pending lab report. - Hemoglobin A1c  9.  Snoring Epworth sleepiness scale total score 14.   Gave patient ESS to show provider at follow up to order sleep study.  Patient advised to follow-up in 2 weeks with Grace Bushy.    Patient seen and examined by Dr. Jerrell Belfast, and note scribed by Philbert Riser. Dimas, CMA.  I have reviewed the document for accuracy and completeness and I agree with above. - Jerrell Belfast, MD   Margarita Rana, MD  [facility]

## 2015-12-16 DIAGNOSIS — Z1159 Encounter for screening for other viral diseases: Secondary | ICD-10-CM | POA: Diagnosis not present

## 2015-12-16 DIAGNOSIS — E7800 Pure hypercholesterolemia, unspecified: Secondary | ICD-10-CM | POA: Diagnosis not present

## 2015-12-16 DIAGNOSIS — I10 Essential (primary) hypertension: Secondary | ICD-10-CM | POA: Diagnosis not present

## 2015-12-16 DIAGNOSIS — R739 Hyperglycemia, unspecified: Secondary | ICD-10-CM | POA: Diagnosis not present

## 2015-12-17 ENCOUNTER — Telehealth: Payer: Self-pay

## 2015-12-17 LAB — CBC WITH DIFFERENTIAL/PLATELET
BASOS: 1 %
Basophils Absolute: 0 10*3/uL (ref 0.0–0.2)
EOS (ABSOLUTE): 0.2 10*3/uL (ref 0.0–0.4)
Eos: 4 %
HEMOGLOBIN: 14.4 g/dL (ref 11.1–15.9)
Hematocrit: 42.3 % (ref 34.0–46.6)
IMMATURE GRANS (ABS): 0 10*3/uL (ref 0.0–0.1)
Immature Granulocytes: 0 %
LYMPHS ABS: 1.8 10*3/uL (ref 0.7–3.1)
LYMPHS: 34 %
MCH: 31.5 pg (ref 26.6–33.0)
MCHC: 34 g/dL (ref 31.5–35.7)
MCV: 93 fL (ref 79–97)
MONOCYTES: 9 %
Monocytes Absolute: 0.5 10*3/uL (ref 0.1–0.9)
NEUTROS ABS: 2.9 10*3/uL (ref 1.4–7.0)
Neutrophils: 52 %
Platelets: 260 10*3/uL (ref 150–379)
RBC: 4.57 x10E6/uL (ref 3.77–5.28)
RDW: 13.9 % (ref 12.3–15.4)
WBC: 5.4 10*3/uL (ref 3.4–10.8)

## 2015-12-17 LAB — COMPREHENSIVE METABOLIC PANEL
A/G RATIO: 1.5 (ref 1.2–2.2)
ALBUMIN: 4 g/dL (ref 3.5–5.5)
ALK PHOS: 48 IU/L (ref 39–117)
ALT: 15 IU/L (ref 0–32)
AST: 20 IU/L (ref 0–40)
BILIRUBIN TOTAL: 0.3 mg/dL (ref 0.0–1.2)
BUN / CREAT RATIO: 16 (ref 9–23)
BUN: 15 mg/dL (ref 6–24)
CHLORIDE: 105 mmol/L (ref 96–106)
CO2: 21 mmol/L (ref 18–29)
Calcium: 9.2 mg/dL (ref 8.7–10.2)
Creatinine, Ser: 0.94 mg/dL (ref 0.57–1.00)
GFR calc non Af Amer: 68 mL/min/{1.73_m2} (ref >59)
GFR, EST AFRICAN AMERICAN: 78 mL/min/{1.73_m2} (ref >59)
GLOBULIN, TOTAL: 2.6 g/dL (ref 1.5–4.5)
Glucose: 98 mg/dL (ref 65–99)
POTASSIUM: 4.1 mmol/L (ref 3.5–5.2)
SODIUM: 144 mmol/L (ref 134–144)
TOTAL PROTEIN: 6.6 g/dL (ref 6.0–8.5)

## 2015-12-17 LAB — PAP IG AND HPV HIGH-RISK
HPV, high-risk: NEGATIVE
PAP Smear Comment: 0

## 2015-12-17 LAB — HEPATITIS C ANTIBODY: Hep C Virus Ab: <0.1 s/co ratio (ref 0.0–0.9)

## 2015-12-17 LAB — LIPID PANEL WITH LDL/HDL RATIO
Cholesterol, Total: 138 mg/dL (ref 100–199)
HDL: 39 mg/dL — ABNORMAL LOW (ref >39)
LDL CALC: 69 mg/dL (ref 0–99)
LDL/HDL RATIO: 1.8 ratio (ref 0.0–3.2)
TRIGLYCERIDES: 151 mg/dL — AB (ref 0–149)
VLDL Cholesterol Cal: 30 mg/dL (ref 5–40)

## 2015-12-17 LAB — HEMOGLOBIN A1C
Est. average glucose Bld gHb Est-mCnc: 114 mg/dL
HEMOGLOBIN A1C: 5.6 % (ref 4.8–5.6)

## 2015-12-17 LAB — TSH: TSH: 2.58 u[IU]/mL (ref 0.450–4.500)

## 2015-12-17 NOTE — Telephone Encounter (Signed)
-----   Message from Margarita Rana, MD sent at 12/17/2015  1:52 PM EDT ----- Pap is normal. Please notify patient.

## 2015-12-17 NOTE — Telephone Encounter (Signed)
lmtcb Emily Drozdowski, CMA  

## 2015-12-19 NOTE — Telephone Encounter (Signed)
LMTCB 12/19/2015  Thanks,   -Mickel Baas

## 2015-12-24 NOTE — Telephone Encounter (Signed)
Patient advised as below.  

## 2015-12-25 DIAGNOSIS — F331 Major depressive disorder, recurrent, moderate: Secondary | ICD-10-CM | POA: Diagnosis not present

## 2016-01-01 ENCOUNTER — Encounter: Payer: Self-pay | Admitting: Physician Assistant

## 2016-01-01 ENCOUNTER — Ambulatory Visit (INDEPENDENT_AMBULATORY_CARE_PROVIDER_SITE_OTHER): Payer: BLUE CROSS/BLUE SHIELD | Admitting: Physician Assistant

## 2016-01-01 VITALS — BP 120/68 | HR 66 | Temp 98.0°F | Resp 16 | Wt 231.4 lb

## 2016-01-01 DIAGNOSIS — R0683 Snoring: Secondary | ICD-10-CM

## 2016-01-01 DIAGNOSIS — I872 Venous insufficiency (chronic) (peripheral): Secondary | ICD-10-CM | POA: Insufficient documentation

## 2016-01-01 DIAGNOSIS — M2142 Flat foot [pes planus] (acquired), left foot: Secondary | ICD-10-CM | POA: Diagnosis not present

## 2016-01-01 DIAGNOSIS — R0681 Apnea, not elsewhere classified: Secondary | ICD-10-CM | POA: Insufficient documentation

## 2016-01-01 DIAGNOSIS — Z6839 Body mass index (BMI) 39.0-39.9, adult: Secondary | ICD-10-CM

## 2016-01-01 DIAGNOSIS — R600 Localized edema: Secondary | ICD-10-CM | POA: Insufficient documentation

## 2016-01-01 DIAGNOSIS — M2141 Flat foot [pes planus] (acquired), right foot: Secondary | ICD-10-CM | POA: Insufficient documentation

## 2016-01-01 DIAGNOSIS — E669 Obesity, unspecified: Secondary | ICD-10-CM | POA: Insufficient documentation

## 2016-01-01 NOTE — Progress Notes (Signed)
Patient: Rachael Brewer Female    DOB: December 29, 1960   55 y.o.   MRN: [id number] Visit Date: 01/01/2016  Today's Provider: Mar Daring, PA-C   Chief Complaint  Patient presents with  . Edema  . Apnea   Subjective:    HPI Edema: Patient here following up on edema. The location of the edema is lower leg(s) bilateral.  The edema has been moderate.  Onset of The edema is present all day and worst in the evening.. The patient states the problem is worsened by long-standing and sitting for a long time. The swelling has been aggravated by hot weather and increased salt intake, relieved by elevation of involved area, and been associated with nothing. Cardiac risk factors include none. Torsemide was increased in the last office visit. She has not noticed much improvement in the symptoms with the increase. She does have visible and palpable varicosities. She does report her legs aching and having a dull throb by the end of the day. She reports her grandmother and mother also both having varicose veins and problems with their legs swelling as well. She states her varicose veins and spider veins have been present since she was 17 and have progressively worsened. She has never used surgical grade compression stockings, only OTC compression stockings without relief.  Apnea: Patient is here for her 2 week follow-up. Patient generally gets 6 or 7 hours of sleep per night.She reports she sleeps a lot, but also wakes up a lot. (Broken sleep). She reports that when she wakes up in the mornings she doesn't feel rested .Snoring of moderate severity. Apneic episodes have not been identified. Patient has had tonsillectomy. She is obese with BMI 39.  She does also have chronic bilateral ankle pain. She does not equate to the swelling. She has had this for longer time period and has had surgery on her feet. She did have an orthopedic that told her there was nothing else he could do. She has scheduled an  appt with a sports medicine orthopedic foot and ankle specialist to see if they could offer any other options.     Allergies  Allergen Reactions  . Sulfa Antibiotics Anaphylaxis  . Penicillins Hives  . Sumatriptan    Current Meds  Medication Sig  . aspirin-acetaminophen-caffeine (EXCEDRIN MIGRAINE) 250-250-65 MG tablet Take by mouth.  . Azelastine HCl 0.15 % SOLN Place into the nose.  . baclofen (LIORESAL) 10 MG tablet Take 10 mg by mouth as needed.   . Cholecalciferol 1000 UNITS tablet Take by mouth.  . escitalopram (LEXAPRO) 5 MG tablet Take 5 mg by mouth daily.   . fluticasone (FLONASE) 50 MCG/ACT nasal spray Place into the nose.  . gabapentin (NEURONTIN) 600 MG tablet Take 600 mg by mouth 2 (two) times daily.   Marland Kitchen omeprazole (PRILOSEC) 40 MG capsule take 1 capsule by mouth once daily  30 MINUTES PRIOR TO EVENING MEAL  . potassium chloride (MICRO-K) 10 MEQ CR capsule Take 2 capsules (20 mEq total) by mouth daily.  . rizatriptan (MAXALT) 10 MG tablet Take 1 tablet by mouth as needed.  . topiramate (TOPAMAX) 200 MG tablet Take 300 mg by mouth daily.   Marland Kitchen torsemide (DEMADEX) 20 MG tablet Take 2 tablets (40 mg total) by mouth once.  . VENTOLIN HFA 108 (90 BASE) MCG/ACT inhaler     Review of Systems  Constitutional: Positive for fatigue. Negative for fever, chills and unexpected weight change.  Respiratory: Negative for  cough, chest tightness and shortness of breath.   Cardiovascular: Positive for leg swelling. Negative for chest pain and palpitations.  Gastrointestinal: Negative for nausea, vomiting and abdominal pain.  Musculoskeletal: Positive for myalgias, joint swelling and arthralgias. Negative for back pain and gait problem.  Skin: Negative for color change.  Neurological: Positive for headaches.    Social History  Substance Use Topics  . Smoking status: Former Smoker    Quit date: 06/21/1999  . Smokeless tobacco: Never Used  . Alcohol Use: 0.0 oz/week    0 Standard  drinks or equivalent per week     Comment: occasional   Objective:   BP 120/68 mmHg  Pulse 66  Temp(Src) 98 F (36.7 C) (Oral)  Resp 16  Wt 231 lb 6.4 oz (104.962 kg)  LMP  (LMP Unknown)  Physical Exam  Constitutional: She appears well-developed and well-nourished. No distress.  Cardiovascular: Normal rate, regular rhythm and normal heart sounds.  Exam reveals no gallop and no friction rub.   No murmur heard. Pulses:      Dorsalis pedis pulses are 2+ on the right side, and 2+ on the left side.       Posterior tibial pulses are 2+ on the right side, and 2+ on the left side.  Pulmonary/Chest: Effort normal and breath sounds normal. No respiratory distress. She has no wheezes. She has no rales.  Musculoskeletal: She exhibits edema (non-pitting).       Right ankle: She exhibits swelling. She exhibits normal range of motion, no deformity and normal pulse. No tenderness. Achilles tendon normal.       Left ankle: She exhibits swelling. She exhibits normal range of motion, no deformity and normal pulse. No tenderness. Achilles tendon normal.  Patient reports it hurts to move ankle but ROM is normal. She has healed surgical incisions over 1st and 2nd MTP joints bilaterally. Pes planus noted, she does have orthotics from her surgery she still uses.  Skin: She is not diaphoretic.  Palpable and visible varicosities noted bilaterally. None were bleeding. No ulcers. No venous stasis dermatitis. Reticular and spider veins noted.  Vitals reviewed.     Assessment & Plan:     1. Bilateral edema of lower extremity Will check renal function with the increase of torsemide made at last visit. She may continue this for the time being but as I discussed with her I doubt it is a fluid overload issue and suspect venous insuff. She would like a referral to vein and vascular for venous duplex US and compression stockings, and to be considered for possible vein ablation if she is a candidate. Referral placed as  below. Advised in the meantime to use compression stockings, elevate when possible and may use IBU if needed for aching.  - Renal Function Panel - Ambulatory referral to Vascular Surgery  2. Venous insufficiency See above medical treatment plan. - Ambulatory referral to Vascular Surgery  3. Apnea Referral to sleep study center made for consideration of CPAP if indicated. - Ambulatory referral to Sleep Studies  4. Snoring See above medical treatment plan. - Ambulatory referral to Sleep Studies  5. BMI 39.0-39.9,adult See above medical treatment plan.  6. Pes planus of both feet Patient is following up with a sports medicine orthopedic. Feel she may need better arch support to prevent plantar fasciitis and tendonitis of the ankle tendons.        Mar Daring, PA-C  [facility]

## 2016-01-01 NOTE — Patient Instructions (Signed)

## 2016-01-02 LAB — RENAL FUNCTION PANEL
ALBUMIN: 3.9 g/dL (ref 3.5–5.5)
BUN/Creatinine Ratio: 28 — ABNORMAL HIGH (ref 9–23)
BUN: 23 mg/dL (ref 6–24)
CHLORIDE: 105 mmol/L (ref 96–106)
CO2: 23 mmol/L (ref 18–29)
Calcium: 8.9 mg/dL (ref 8.7–10.2)
Creatinine, Ser: 0.83 mg/dL (ref 0.57–1.00)
GFR calc non Af Amer: 79 mL/min/{1.73_m2} (ref >59)
GFR, EST AFRICAN AMERICAN: 91 mL/min/{1.73_m2} (ref >59)
GLUCOSE: 102 mg/dL — AB (ref 65–99)
PHOSPHORUS: 3.2 mg/dL (ref 2.5–4.5)
POTASSIUM: 4.1 mmol/L (ref 3.5–5.2)
SODIUM: 140 mmol/L (ref 134–144)

## 2016-01-19 DIAGNOSIS — I890 Lymphedema, not elsewhere classified: Secondary | ICD-10-CM | POA: Diagnosis not present

## 2016-01-19 DIAGNOSIS — M79672 Pain in left foot: Secondary | ICD-10-CM | POA: Diagnosis not present

## 2016-01-19 DIAGNOSIS — M19079 Primary osteoarthritis, unspecified ankle and foot: Secondary | ICD-10-CM | POA: Diagnosis not present

## 2016-01-19 DIAGNOSIS — M2141 Flat foot [pes planus] (acquired), right foot: Secondary | ICD-10-CM | POA: Diagnosis not present

## 2016-01-19 DIAGNOSIS — M2142 Flat foot [pes planus] (acquired), left foot: Secondary | ICD-10-CM | POA: Diagnosis not present

## 2016-01-19 DIAGNOSIS — M79671 Pain in right foot: Secondary | ICD-10-CM | POA: Diagnosis not present

## 2016-01-30 DIAGNOSIS — M7989 Other specified soft tissue disorders: Secondary | ICD-10-CM | POA: Diagnosis not present

## 2016-01-30 DIAGNOSIS — M79609 Pain in unspecified limb: Secondary | ICD-10-CM | POA: Diagnosis not present

## 2016-01-30 DIAGNOSIS — I890 Lymphedema, not elsewhere classified: Secondary | ICD-10-CM | POA: Diagnosis not present

## 2016-02-19 ENCOUNTER — Ambulatory Visit: Payer: BLUE CROSS/BLUE SHIELD | Attending: Otolaryngology

## 2016-02-19 DIAGNOSIS — E669 Obesity, unspecified: Secondary | ICD-10-CM | POA: Insufficient documentation

## 2016-02-19 DIAGNOSIS — R0683 Snoring: Secondary | ICD-10-CM | POA: Diagnosis not present

## 2016-02-19 DIAGNOSIS — I10 Essential (primary) hypertension: Secondary | ICD-10-CM | POA: Insufficient documentation

## 2016-02-19 DIAGNOSIS — G4733 Obstructive sleep apnea (adult) (pediatric): Secondary | ICD-10-CM | POA: Diagnosis not present

## 2016-03-03 ENCOUNTER — Encounter: Payer: Self-pay | Admitting: Physician Assistant

## 2016-03-05 DIAGNOSIS — M79609 Pain in unspecified limb: Secondary | ICD-10-CM | POA: Diagnosis not present

## 2016-03-05 DIAGNOSIS — M7989 Other specified soft tissue disorders: Secondary | ICD-10-CM | POA: Diagnosis not present

## 2016-03-19 ENCOUNTER — Ambulatory Visit: Payer: BLUE CROSS/BLUE SHIELD | Attending: Otolaryngology

## 2016-03-19 DIAGNOSIS — R0683 Snoring: Secondary | ICD-10-CM | POA: Diagnosis not present

## 2016-03-19 DIAGNOSIS — G4733 Obstructive sleep apnea (adult) (pediatric): Secondary | ICD-10-CM | POA: Insufficient documentation

## 2016-04-01 ENCOUNTER — Encounter: Payer: Self-pay | Admitting: Physician Assistant

## 2016-04-05 DIAGNOSIS — G4733 Obstructive sleep apnea (adult) (pediatric): Secondary | ICD-10-CM | POA: Diagnosis not present

## 2016-05-06 DIAGNOSIS — G4733 Obstructive sleep apnea (adult) (pediatric): Secondary | ICD-10-CM | POA: Diagnosis not present

## 2016-05-07 ENCOUNTER — Encounter (INDEPENDENT_AMBULATORY_CARE_PROVIDER_SITE_OTHER): Payer: Self-pay

## 2016-05-07 ENCOUNTER — Ambulatory Visit (INDEPENDENT_AMBULATORY_CARE_PROVIDER_SITE_OTHER): Payer: BLUE CROSS/BLUE SHIELD | Admitting: Vascular Surgery

## 2016-05-07 ENCOUNTER — Encounter (INDEPENDENT_AMBULATORY_CARE_PROVIDER_SITE_OTHER): Payer: Self-pay | Admitting: Vascular Surgery

## 2016-05-07 VITALS — BP 146/78 | HR 68 | Resp 16 | Ht 64.0 in | Wt 230.2 lb

## 2016-05-07 DIAGNOSIS — R600 Localized edema: Secondary | ICD-10-CM | POA: Diagnosis not present

## 2016-05-07 DIAGNOSIS — Z6839 Body mass index (BMI) 39.0-39.9, adult: Secondary | ICD-10-CM

## 2016-05-07 DIAGNOSIS — I10 Essential (primary) hypertension: Secondary | ICD-10-CM

## 2016-05-07 DIAGNOSIS — I890 Lymphedema, not elsewhere classified: Secondary | ICD-10-CM | POA: Diagnosis not present

## 2016-05-07 DIAGNOSIS — E7800 Pure hypercholesterolemia, unspecified: Secondary | ICD-10-CM

## 2016-05-07 NOTE — Progress Notes (Signed)
MRN : [id number]  Rachael Brewer is a 55 y.o. (06-03-1961) female who presents with chief complaint of  Chief Complaint  Patient presents with  . Follow-up  .  History of Present Illness: Patient returns today in follow up of Her leg swelling. She has been wearing 20-30 mmHg compression stockings diligently for about 3 months now. This provided some mild relief during the day but by evening her legs are just swollen and she cannot see much of a difference. Exercise and elevation of also not provide significant. She does not have ulceration or infection. She denies fever or chills.  Current Outpatient Prescriptions  Medication Sig Dispense Refill  . aspirin-acetaminophen-caffeine (EXCEDRIN MIGRAINE) 250-250-65 MG tablet Take by mouth.    . Azelastine HCl 0.15 % SOLN Place into the nose.    . baclofen (LIORESAL) 10 MG tablet Take 10 mg by mouth as needed.     . Cholecalciferol 1000 UNITS tablet Take by mouth.    . escitalopram (LEXAPRO) 5 MG tablet Take 5 mg by mouth daily.     . fluticasone (FLONASE) 50 MCG/ACT nasal spray Place into the nose.    . gabapentin (NEURONTIN) 600 MG tablet Take 600 mg by mouth 2 (two) times daily.     Marland Kitchen omeprazole (PRILOSEC) 40 MG capsule take 1 capsule by mouth once daily  30 MINUTES PRIOR TO EVENING MEAL  0  . potassium chloride (MICRO-K) 10 MEQ CR capsule Take 2 capsules (20 mEq total) by mouth daily. 60 capsule 5  . rizatriptan (MAXALT) 10 MG tablet Take 1 tablet by mouth as needed.  0  . topiramate (TOPAMAX) 200 MG tablet Take 300 mg by mouth daily.     Marland Kitchen torsemide (DEMADEX) 20 MG tablet Take 2 tablets (40 mg total) by mouth once. 60 tablet 5  . VENTOLIN HFA 108 (90 BASE) MCG/ACT inhaler   0   No current facility-administered medications for this visit.     Past Medical History:  Diagnosis Date  . Migraines     Past Surgical History:  Procedure Laterality Date  . ANTERIOR CRUCIATE LIGAMENT REPAIR Right 1996  . DILATION AND CURETTAGE OF  UTERUS  2004  . FOOT SURGERY    . KNEE SURGERY Right     Social History Social History  Substance Use Topics  . Smoking status: Former Smoker    Quit date: 06/21/1999  . Smokeless tobacco: Never Used  . Alcohol use 0.0 oz/week     Comment: occasional    Family History Family History  Problem Relation Age of Onset  . Diabetes Other   . Hypertension Other   . Glaucoma Other   . Hypertension Brother   . Diabetes Brother     Allergies  Allergen Reactions  . Sulfa Antibiotics Anaphylaxis  . Penicillins Hives  . Sumatriptan      REVIEW OF SYSTEMS (Negative unless checked)  Constitutional: [] Weight loss  [] Fever  [] Chills Cardiac: [] Chest pain   [] Chest pressure   [] Palpitations   [] Shortness of breath when laying flat   [] Shortness of breath at rest   [] Shortness of breath with exertion. Vascular:  [] Pain in legs with walking   [] Pain in legs at rest   [] Pain in legs when laying flat   [] Claudication   [] Pain in feet when walking  [] Pain in feet at rest  [] Pain in feet when laying flat   [] History of DVT   [] Phlebitis   [x] Swelling in legs   [x] Varicose veins   []   Non-healing ulcers Pulmonary:   [] Uses home oxygen   [] Productive cough   [] Hemoptysis   [] Wheeze  [] COPD   [] Asthma Neurologic:  [] Dizziness  [] Blackouts   [] Seizures   [] History of stroke   [] History of TIA  [] Aphasia   [] Temporary blindness   [] Dysphagia   [] Weakness or numbness in arms   [] Weakness or numbness in legs Musculoskeletal:  [] Arthritis   [] Joint swelling   [] Joint pain   [] Low back pain Hematologic:  [] Easy bruising  [] Easy bleeding   [] Hypercoagulable state   [] Anemic   Gastrointestinal:  [] Blood in stool   [] Vomiting blood  [] Gastroesophageal reflux/heartburn   [] Abdominal pain Genitourinary:  [] Chronic kidney disease   [] Difficult urination  [] Frequent urination  [] Burning with urination   [] Hematuria Skin:  [] Rashes   [] Ulcers   [] Wounds Psychological:  [] History of anxiety   []  History of major  depression.  Physical Examination  BP (!) 146/78   Pulse 68   Resp 16   Ht 5\' 4"  (1.626 m)   Wt 230 lb 3.2 oz (104.4 kg)   LMP  (LMP Unknown)   BMI 39.51 kg/m  Gen:  WD/WN, NAD Head: [place]/AT, No temporalis wasting. Ear/Nose/Throat: Hearing grossly intact, nares w/o erythema or drainage, trachea midline Eyes: Conjunctiva clear. Sclera non-icteric Neck: Supple.  No JVD.  Pulmonary:  Good air movement, no use of accessory muscles.  Cardiac: RRR, normal S1, S2 Vascular:  Vessel Right Left  Radial Palpable Palpable  Ulnar Palpable Palpable  Brachial Palpable Palpable  Carotid Palpable, without bruit Palpable, without bruit  Aorta Not palpable N/A  Femoral Palpable Palpable  Popliteal Palpable Palpable  PT Palpable Palpable  DP Palpable Palpable   Gastrointestinal: soft, non-tender/non-distended. No guarding/reflex.  Musculoskeletal: M/S 5/5 throughout.  No deformity or atrophy. 1-2+ BLE edema. Neurologic: Sensation grossly intact in extremities.  Symmetrical.  Speech is fluent.  Psychiatric: Judgment intact, Mood & affect appropriate for pt's clinical situation. Dermatologic: No rashes or ulcers noted.  No cellulitis or open wounds. Lymph : No Cervical, Axillary, or Inguinal lymphadenopathy.      Labs No results found for this or any previous visit (from the past 2160 hour(s)).  Radiology No results found.    Assessment/Plan  BMI 39.0-39.9,adult Weight loss and exercise would help lower extremity swelling.  BP (high blood pressure) blood pressure control important in reducing the progression of atherosclerotic disease. On appropriate oral medications. On diuretics which should help lower extremity swelling.  Bilateral edema of lower extremity Has only improved slightly with compression stockings, elevation, and exercise over the past 3 months. Venous workup was essentially unrevealing. Would likely benefit from a lymphedema pump as described  below.  Lymphedema Despite appropriate conservative therapies including 20-30 mmHg compression stockings, exercise, and increased leg elevation the patient continues to have symptoms of stage two lymphedema. The symptoms are significant and bothersome on a daily basis. At this point, I think a lymphedema pump would be an excellent adjuvant therapy to try to improve their symptoms. I have discussed the reason and rationale for the lymphedema pump. This would be in addition to the conservative therapies and not to replace them. I will plan to see the patient back in several months to assess their response to the addition of the lymphedema pump as well as continued conservative therapy.    Leotis Pain, MD  05/07/2016 3:50 PM    This note was created with Dragon medical transcription system.  Any errors from dictation are purely unintentional

## 2016-05-07 NOTE — Assessment & Plan Note (Signed)
Weight loss and exercise would help lower extremity swelling.

## 2016-05-07 NOTE — Assessment & Plan Note (Signed)
Despite appropriate conservative therapies including 20-30 mmHg compression stockings, exercise, and increased leg elevation the patient continues to have symptoms of stage two lymphedema. The symptoms are significant and bothersome on a daily basis. At this point, I think a lymphedema pump would be an excellent adjuvant therapy to try to improve their symptoms. I have discussed the reason and rationale for the lymphedema pump. This would be in addition to the conservative therapies and not to replace them. I will plan to see the patient back in several months to assess their response to the addition of the lymphedema pump as well as continued conservative therapy.

## 2016-05-07 NOTE — Patient Instructions (Signed)
Lymphedema Introduction Lymphedema is swelling that is caused by the abnormal collection of lymph under the skin. Lymph is fluid from the tissues in your body that travels in the lymphatic system. This system is part of the immune system and includes lymph nodes and lymph vessels. The lymph vessels collect and carry the excess fluid, fats, proteins, and wastes from the tissues of the body to the bloodstream. This system also works to clean and remove bacteria and waste products from the body. Lymphedema occurs when the lymphatic system is blocked. When the lymph vessels or lymph nodes are blocked or damaged, lymph does not drain properly, causing an abnormal buildup of lymph. This leads to swelling in the arms or legs. Lymphedema cannot be cured by medicines, but various methods can be used to help reduce the swelling. What are the causes? There are two types of lymphedema. Primary lymphedema is caused by the absence or abnormality of the lymph vessel at birth. Secondary lymphedema is more common. It occurs when the lymph vessel is damaged or blocked. Common causes of lymph vessel blockage include:  Skin infection, such as cellulitis.  Infection by parasites (filariasis).  Injury.  Cancer.  Radiation therapy.  Formation of scar tissue.  Surgery. What are the signs or symptoms? Symptoms of this condition include:  Swelling of the arm or leg.  A heavy or tight feeling in the arm or leg.  Swelling of the feet, toes, or fingers. Shoes or rings may fit more tightly than before.  Redness of the skin over the affected area.  Limited movement of the affected limb.  Sensitivity to touch or discomfort in the affected limb. How is this diagnosed? This condition may be diagnosed with:  A physical exam.  Medical history.  Bioimpedance spectroscopy. In this test, painless electrical currents are used to measure fluid levels in your body.  Imaging tests, such as:  Lymphoscintigraphy. In  this test, a low dose of a radioactive substance is injected to trace the flow of lymph through the lymph vessels.  MRI.  CT scan.  Duplex ultrasound. This test uses sound waves to produce images of the vessels and the blood flow on a screen.  Lymphangiography. In this test, a contrast dye is injected into the lymph vessel to help show blockages. How is this treated? Treatment for this condition may depend on the cause. Treatment may include:  Exercise. Certain exercises can help fluid move out of the affected limb.  Massage. Gentle massage of the affected limb can help move the fluid out of the area.  Compression. Various methods may be used to apply pressure to the affected limb in order to reduce the swelling.  Wearing compression stockings or sleeves on the affected limb.  Bandaging the affected limb.  Using an external pump that is attached to a sleeve that alternates between applying pressure and releasing pressure.  Surgery. This is usually only done for severe cases. For example, surgery may be done if you have trouble moving the limb or if the swelling does not get better with other treatments. If an underlying condition is causing the lymphedema, treatment for that condition is needed. For example, antibiotic medicines may be used to treat an infection. Follow these instructions at home: Activities  Exercise regularly as directed by your health care provider.  Do not sit with your legs crossed.  When possible, keep the affected limb raised (elevated) above the level of your heart.  Avoid carrying things with an arm that is  affected by lymphedema.  Remember that the affected area is more likely to become injured or infected.  Take these steps to help prevent infection:  Keep the affected area clean and dry.  Protect your skin from cuts. For example, you should use gloves while cooking or gardening. Do not walk barefoot. If you shave the affected area, use an  Copy. General instructions  Take medicines only as directed by your health care provider.  Eat a healthy diet that includes a lot of fruits and vegetables.  Do not wear tight clothes, shoes, or jewelry.  Do not use heating pads over the affected area.  Avoid having blood pressure checked on the affected limb.  Keep all follow-up visits as directed by your health care provider. This is important. Contact a health care provider if:  You continue to have swelling in your limb.  You have a fever.  You have a cut that does not heal.  You have redness or pain in the affected area.  You have new swelling in your limb that comes on suddenly.  You develop purplish spots or sores (lesions) on your limb. Get help right away if:  You have a skin rash.  You have chills or sweats.  You have shortness of breath. This information is not intended to replace advice given to you by your health care provider. Make sure you discuss any questions you have with your health care provider. Document Released: 04/04/2007 Document Revised: 02/12/2016 Document Reviewed: 05/15/2014  2017 Elsevier

## 2016-05-07 NOTE — Assessment & Plan Note (Signed)
Has only improved slightly with compression stockings, elevation, and exercise over the past 3 months. Venous workup was essentially unrevealing. Would likely benefit from a lymphedema pump as described below.

## 2016-05-07 NOTE — Assessment & Plan Note (Signed)
blood pressure control important in reducing the progression of atherosclerotic disease. On appropriate oral medications. On diuretics which should help lower extremity swelling.

## 2016-05-17 DIAGNOSIS — Z23 Encounter for immunization: Secondary | ICD-10-CM | POA: Diagnosis not present

## 2016-05-24 DIAGNOSIS — G43839 Menstrual migraine, intractable, without status migrainosus: Secondary | ICD-10-CM | POA: Diagnosis not present

## 2016-05-24 DIAGNOSIS — G43719 Chronic migraine without aura, intractable, without status migrainosus: Secondary | ICD-10-CM | POA: Diagnosis not present

## 2016-05-24 DIAGNOSIS — G43019 Migraine without aura, intractable, without status migrainosus: Secondary | ICD-10-CM | POA: Diagnosis not present

## 2016-05-27 ENCOUNTER — Telehealth: Payer: Self-pay | Admitting: Physician Assistant

## 2016-05-27 DIAGNOSIS — F3342 Major depressive disorder, recurrent, in full remission: Secondary | ICD-10-CM

## 2016-05-27 MED ORDER — ESCITALOPRAM OXALATE 5 MG PO TABS: 5.0000 mg | ORAL_TABLET | Freq: Every day | ORAL | 5 refills | Status: DC

## 2016-05-27 NOTE — Telephone Encounter (Signed)
Pt stated that she no longer sees Dr. Sherlynn Stalls b/c they are no longer in that office and she has been taking the escitalopram (LEXAPRO) 5 MG tablet until the Rx ran out 3 days ago. Pt stated she has been without the medication for 3 days and she thinks that it is causing withdrawal and is why she has starting having diarrhea. Pt stated she contacted the office and b/c she hasn't seen anyone in that office in a year they can wrote the Rx and suggested she contact PCP. Pt is hoping Tawanna Sat will take over the medication. [facility]. Please advise. Thanks TNP

## 2016-05-27 NOTE — Telephone Encounter (Signed)
Please review. Thanks!  

## 2016-05-27 NOTE — Telephone Encounter (Signed)
Sent in lexapro 5mg  to [facility]

## 2016-05-27 NOTE — Telephone Encounter (Signed)
Advised patient as below.  

## 2016-06-05 DIAGNOSIS — G4733 Obstructive sleep apnea (adult) (pediatric): Secondary | ICD-10-CM | POA: Diagnosis not present

## 2016-06-09 DIAGNOSIS — I890 Lymphedema, not elsewhere classified: Secondary | ICD-10-CM | POA: Diagnosis not present

## 2016-06-18 ENCOUNTER — Encounter: Payer: Self-pay | Admitting: Physician Assistant

## 2016-06-18 ENCOUNTER — Ambulatory Visit (INDEPENDENT_AMBULATORY_CARE_PROVIDER_SITE_OTHER): Payer: BLUE CROSS/BLUE SHIELD | Admitting: Physician Assistant

## 2016-06-18 ENCOUNTER — Telehealth: Payer: Self-pay | Admitting: Family Medicine

## 2016-06-18 VITALS — BP 124/68 | HR 78 | Temp 98.5°F | Resp 16 | Wt 232.0 lb

## 2016-06-18 DIAGNOSIS — R12 Heartburn: Secondary | ICD-10-CM | POA: Diagnosis not present

## 2016-06-18 DIAGNOSIS — J45901 Unspecified asthma with (acute) exacerbation: Secondary | ICD-10-CM

## 2016-06-18 MED ORDER — RANITIDINE HCL 150 MG PO CAPS: 150.0000 mg | ORAL_CAPSULE | Freq: Every day | ORAL | 0 refills | Status: DC | PRN

## 2016-06-18 MED ORDER — HYDROCODONE-HOMATROPINE 5-1.5 MG/5ML PO SYRP: 5.0000 mL | ORAL_SOLUTION | Freq: Three times a day (TID) | ORAL | 0 refills | Status: DC | PRN

## 2016-06-18 MED ORDER — AZITHROMYCIN 250 MG PO TABS: ORAL_TABLET | ORAL | 0 refills | Status: DC

## 2016-06-18 MED ORDER — PREDNISONE 20 MG PO TABS: 60.0000 mg | ORAL_TABLET | Freq: Every day | ORAL | 0 refills | Status: AC

## 2016-06-18 MED ORDER — LEVALBUTEROL HCL 1.25 MG/0.5ML IN NEBU: 1.2500 mg | INHALATION_SOLUTION | Freq: Once | RESPIRATORY_TRACT | Status: DC

## 2016-06-18 MED ORDER — LEVALBUTEROL HCL 1.25 MG/3ML IN NEBU: 1.2500 mg | INHALATION_SOLUTION | Freq: Three times a day (TID) | RESPIRATORY_TRACT | 1 refills | Status: DC | PRN

## 2016-06-18 MED ORDER — RANITIDINE HCL 150 MG PO CAPS: 150.0000 mg | ORAL_CAPSULE | Freq: Once | ORAL | 0 refills | Status: DC

## 2016-06-18 NOTE — Progress Notes (Signed)
Patient: Rachael Brewer Female    DOB: 01-01-1961   55 y.o.   MRN: [id number] Visit Date: 06/18/2016  Today's Provider: Trinna Post, PA-C   Chief Complaint  Patient presents with  . Cough   Subjective:    HPI   Pt is a 54 y/o female with a history of asthmatic bronchitis on Ventolin who is here for a a cough and congestion. She reports that it has been about a week. Cough is worse at night, she is SOB when coughing and feels like her chest is tight,wheezing. She has a headache and some nasal congestion. Denies fever, chills, sweats or body aches. She has back pain and sore throat from coughing and sometimes she has green/white and pink colored sputum.   Patient also complains of heartburn. Retrosternal burning after triggering foods. No chest pain, radiation to arm.     Allergies  Allergen Reactions  . Sulfa Antibiotics Anaphylaxis  . Penicillins Hives  . Sumatriptan      Current Outpatient Prescriptions:  .  aspirin-acetaminophen-caffeine (EXCEDRIN MIGRAINE) 250-250-65 MG tablet, Take by mouth., Disp: , Rfl:  .  Azelastine HCl 0.15 % SOLN, Place into the nose., Disp: , Rfl:  .  baclofen (LIORESAL) 10 MG tablet, Take 10 mg by mouth as needed. , Disp: , Rfl:  .  Cholecalciferol 1000 UNITS tablet, Take by mouth., Disp: , Rfl:  .  escitalopram (LEXAPRO) 5 MG tablet, Take 1 tablet (5 mg total) by mouth daily., Disp: 30 tablet, Rfl: 5 .  fluticasone (FLONASE) 50 MCG/ACT nasal spray, Place into the nose., Disp: , Rfl:  .  gabapentin (NEURONTIN) 600 MG tablet, Take 600 mg by mouth 2 (two) times daily. , Disp: , Rfl:  .  omeprazole (PRILOSEC) 40 MG capsule, take 1 capsule by mouth once daily  30 MINUTES PRIOR TO EVENING MEAL, Disp: , Rfl: 0 .  potassium chloride (MICRO-K) 10 MEQ CR capsule, Take 2 capsules (20 mEq total) by mouth daily., Disp: 60 capsule, Rfl: 5 .  rizatriptan (MAXALT) 10 MG tablet, Take 1 tablet by mouth as needed., Disp: , Rfl: 0 .  topiramate  (TOPAMAX) 200 MG tablet, Take 300 mg by mouth daily. , Disp: , Rfl:  .  torsemide (DEMADEX) 20 MG tablet, Take 2 tablets (40 mg total) by mouth once., Disp: 60 tablet, Rfl: 5 .  VENTOLIN HFA 108 (90 BASE) MCG/ACT inhaler, , Disp: , Rfl: 0 .  azithromycin (ZITHROMAX) 250 MG tablet, Take two tablets the first day, one tablet the following four days., Disp: 6 tablet, Rfl: 0 .  HYDROcodone-homatropine (HYCODAN) 5-1.5 MG/5ML syrup, Take 5 mLs by mouth every 8 (eight) hours as needed for cough., Disp: 120 mL, Rfl: 0 .  levalbuterol (XOPENEX) 1.25 MG/3ML nebulizer solution, Take 1.25 mg by nebulization every 8 (eight) hours as needed for wheezing or shortness of breath., Disp: 72 mL, Rfl: 1 .  predniSONE (DELTASONE) 20 MG tablet, Take 3 tablets (60 mg total) by mouth daily with breakfast., Disp: 15 tablet, Rfl: 0 .  ranitidine (ZANTAC) 150 MG capsule, Take 1 capsule (150 mg total) by mouth once., Disp: 1 capsule, Rfl: 0  Current Facility-Administered Medications:  .  levalbuterol (XOPENEX) nebulizer solution 1.25 mg, 1.25 mg, Nebulization, Once, Adriana M Pollak, PA-C  Review of Systems  Constitutional: Positive for fatigue.  HENT: Positive for congestion, sinus pain, sinus pressure and sore throat.   Eyes: Negative.   Respiratory: Positive for cough, choking, chest tightness,  shortness of breath and wheezing.   Cardiovascular: Negative.   Gastrointestinal: Negative.   Endocrine: Negative.   Genitourinary: Negative.   Musculoskeletal: Positive for back pain.  Allergic/Immunologic: Negative.   Neurological: Positive for headaches.  Hematological: Negative.   Psychiatric/Behavioral: Negative.     Social History  Substance Use Topics  . Smoking status: Former Smoker    Quit date: 06/21/1999  . Smokeless tobacco: Never Used  . Alcohol use 0.0 oz/week     Comment: occasional   Objective:   BP 124/68 (BP Location: Right Arm, Patient Position: Sitting, Cuff Size: Large)   Pulse 78   Temp  98.5 F (36.9 C) (Oral)   Resp 16   Wt 232 lb (105.2 kg)   LMP  (LMP Unknown)   SpO2 98%   BMI 39.82 kg/m   Physical Exam  Constitutional: She appears well-developed and well-nourished.  HENT:  Right Ear: External ear normal.  Left Ear: External ear normal.  Nose: Right sinus exhibits frontal sinus tenderness. Left sinus exhibits frontal sinus tenderness.  Mouth/Throat: Oropharynx is clear and moist. No oropharyngeal exudate.  Eyes: Right eye exhibits no discharge. Left eye exhibits no discharge.  Neck: Neck supple.  Cardiovascular: Normal rate and regular rhythm.   Pulmonary/Chest: Effort normal. No respiratory distress. She has no decreased breath sounds. She has wheezes. She has no rhonchi. She has no rales.  Diffuse wheezes in bilateral lung fields.  Lymphadenopathy:    She has no cervical adenopathy.  Skin: Skin is warm and dry.  Psychiatric: She has a normal mood and affect. Her behavior is normal.        Assessment & Plan:     1. Asthmatic bronchitis with acute exacerbation, unspecified asthma severity, unspecified whether persistent  In office neubulizer treatment administered with improvement of symptoms.  Will send home with below medications. Patient has nebulizer at home, she can have another treatment in 8 hours.  - predniSONE (DELTASONE) 20 MG tablet; Take 3 tablets (60 mg total) by mouth daily with breakfast.  Dispense: 15 tablet; Refill: 0 - azithromycin (ZITHROMAX) 250 MG tablet; Take two tablets the first day, one tablet the following four days.  Dispense: 6 tablet; Refill: 0 - HYDROcodone-homatropine (HYCODAN) 5-1.5 MG/5ML syrup; Take 5 mLs by mouth every 8 (eight) hours as needed for cough.  Dispense: 120 mL; Refill: 0 - levalbuterol (XOPENEX) nebulizer solution 1.25 mg; Take 1.25 mg by nebulization once. - levalbuterol (XOPENEX) 1.25 MG/3ML nebulizer solution; Take 1.25 mg by nebulization every 8 (eight) hours as needed for wheezing or shortness of breath.   Dispense: 72 mL; Refill: 1  2. Heartburn  Counseled on diet and avoiding trigger foods.  - ranitidine (ZANTAC) 150 MG capsule; One capsule daily PRN  I have spent 25 minutes on this patient, >50% of which was spent on counseling and coordination of care.   The entirety of the information documented in the History of Present Illness, Review of Systems and Physical Exam were personally obtained by me. Portions of this information were initially documented by [place] and reviewed by me for thoroughness and accuracy.   Return if symptoms worsen or fail to improve.  Patient Instructions  Asthma, Acute Bronchospasm Acute bronchospasm caused by asthma is also referred to as an asthma attack. Bronchospasm means your air passages become narrowed. The narrowing is caused by inflammation and tightening of the muscles in the air tubes (bronchi) in your lungs. This can make it hard to breathe or cause you to wheeze  and cough. What are the causes? Possible triggers are:  Animal dander from the skin, hair, or feathers of animals.  Dust mites contained in house dust.  Cockroaches.  Pollen from trees or grass.  Mold.  Cigarette or tobacco smoke.  Air pollutants such as dust, household cleaners, hair sprays, aerosol sprays, paint fumes, strong chemicals, or strong odors.  Cold air or weather changes. Cold air may trigger inflammation. Winds increase molds and pollens in the air.  Strong emotions such as crying or laughing hard.  Stress.  Certain medicines such as aspirin or beta-blockers.  Sulfites in foods and drinks, such as dried fruits and wine.  Infections or inflammatory conditions, such as a flu, cold, or inflammation of the nasal membranes (rhinitis).  Gastroesophageal reflux disease (GERD). GERD is a condition where stomach acid backs up into your esophagus.  Exercise or strenuous activity. What are the signs or symptoms?  Wheezing.  Excessive coughing, particularly  at night.  Chest tightness.  Shortness of breath. How is this diagnosed? Your health care provider will ask you about your medical history and perform a physical exam. A chest X-ray or blood testing may be performed to look for other causes of your symptoms or other conditions that may have triggered your asthma attack. How is this treated? Treatment is aimed at reducing inflammation and opening up the airways in your lungs. Most asthma attacks are treated with inhaled medicines. These include quick relief or rescue medicines (such as bronchodilators) and controller medicines (such as inhaled corticosteroids). These medicines are sometimes given through an inhaler or a nebulizer. Systemic steroid medicine taken by mouth or given through an IV tube also can be used to reduce the inflammation when an attack is moderate or severe. Antibiotic medicines are only used if a bacterial infection is present. Follow these instructions at home:  Rest.  Drink plenty of liquids. This helps the mucus to remain thin and be easily coughed up. Only use caffeine in moderation and do not use alcohol until you have recovered from your illness.  Do not smoke. Avoid being exposed to secondhand smoke.  You play a critical role in keeping yourself in good health. Avoid exposure to things that cause you to wheeze or to have breathing problems.  Keep your medicines up-to-date and available. Carefully follow your health care provider's treatment plan.  Take your medicine exactly as prescribed.  When pollen or pollution is bad, keep windows closed and use an air conditioner or go to places with air conditioning.  Asthma requires careful medical care. See your health care provider for a follow-up as advised. If you are more than [redacted] weeks pregnant and you were prescribed any new medicines, let your obstetrician know about the visit and how you are doing. Follow up with your health care provider as directed.  After you  have recovered from your asthma attack, make an appointment with your outpatient doctor to talk about ways to reduce the likelihood of future attacks. If you do not have a doctor who manages your asthma, make an appointment with a primary care doctor to discuss your asthma. Get help right away if:  You are getting worse.  You have trouble breathing. If severe, call your local emergency services (911 in the [place]  You develop chest pain or discomfort.  You are vomiting.  You are not able to keep fluids down.  You are coughing up yellow, green, brown, or bloody sputum.  You have a fever and your symptoms  suddenly get worse.  You have trouble swallowing. This information is not intended to replace advice given to you by your health care provider. Make sure you discuss any questions you have with your health care provider. Document Released: 09/22/2006 Document Revised: 11/19/2015 Document Reviewed: 12/13/2012 Elsevier Interactive Patient Education  2017 [place]

## 2016-06-18 NOTE — Patient Instructions (Signed)
Asthma, Acute Bronchospasm °Acute bronchospasm caused by asthma is also referred to as an asthma attack. Bronchospasm means your air passages become narrowed. The narrowing is caused by inflammation and tightening of the muscles in the air tubes (bronchi) in your lungs. This can make it hard to breathe or cause you to wheeze and cough. °What are the causes? °Possible triggers are: °· Animal dander from the skin, hair, or feathers of animals. °· Dust mites contained in house dust. °· Cockroaches. °· Pollen from trees or grass. °· Mold. °· Cigarette or tobacco smoke. °· Air pollutants such as dust, household cleaners, hair sprays, aerosol sprays, paint fumes, strong chemicals, or strong odors. °· Cold air or weather changes. Cold air may trigger inflammation. Winds increase molds and pollens in the air. °· Strong emotions such as crying or laughing hard. °· Stress. °· Certain medicines such as aspirin or beta-blockers. °· Sulfites in foods and drinks, such as dried fruits and wine. °· Infections or inflammatory conditions, such as a flu, cold, or inflammation of the nasal membranes (rhinitis). °· Gastroesophageal reflux disease (GERD). GERD is a condition where stomach acid backs up into your esophagus. °· Exercise or strenuous activity. ° °What are the signs or symptoms? °· Wheezing. °· Excessive coughing, particularly at night. °· Chest tightness. °· Shortness of breath. °How is this diagnosed? °Your health care provider will ask you about your medical history and perform a physical exam. A chest X-ray or blood testing may be performed to look for other causes of your symptoms or other conditions that may have triggered your asthma attack. °How is this treated? °Treatment is aimed at reducing inflammation and opening up the airways in your lungs. Most asthma attacks are treated with inhaled medicines. These include quick relief or rescue medicines (such as bronchodilators) and controller medicines (such as inhaled  corticosteroids). These medicines are sometimes given through an inhaler or a nebulizer. Systemic steroid medicine taken by mouth or given through an IV tube also can be used to reduce the inflammation when an attack is moderate or severe. Antibiotic medicines are only used if a bacterial infection is present. °Follow these instructions at home: °· Rest. °· Drink plenty of liquids. This helps the mucus to remain thin and be easily coughed up. Only use caffeine in moderation and do not use alcohol until you have recovered from your illness. °· Do not smoke. Avoid being exposed to secondhand smoke. °· You play a critical role in keeping yourself in good health. Avoid exposure to things that cause you to wheeze or to have breathing problems. °· Keep your medicines up-to-date and available. Carefully follow your health care provider’s treatment plan. °· Take your medicine exactly as prescribed. °· When pollen or pollution is bad, keep windows closed and use an air conditioner or go to places with air conditioning. °· Asthma requires careful medical care. See your health care provider for a follow-up as advised. If you are more than [redacted] weeks pregnant and you were prescribed any new medicines, let your obstetrician know about the visit and how you are doing. Follow up with your health care provider as directed. °· After you have recovered from your asthma attack, make an appointment with your outpatient doctor to talk about ways to reduce the likelihood of future attacks. If you do not have a doctor who manages your asthma, make an appointment with a primary care doctor to discuss your asthma. °Get help right away if: °· You are getting worse. °·   You have trouble breathing. If severe, call your local emergency services (911 in the [place] °· You develop chest pain or discomfort. °· You are vomiting. °· You are not able to keep fluids down. °· You are coughing up yellow, green, brown, or bloody sputum. °· You have a fever  and your symptoms suddenly get worse. °· You have trouble swallowing. °This information is not intended to replace advice given to you by your health care provider. Make sure you discuss any questions you have with your health care provider. °Document Released: 09/22/2006 Document Revised: 11/19/2015 Document Reviewed: 12/13/2012 °Elsevier Interactive Patient Education © 2017 [employer] ° °

## 2016-07-06 DIAGNOSIS — G4733 Obstructive sleep apnea (adult) (pediatric): Secondary | ICD-10-CM | POA: Diagnosis not present

## 2016-07-10 DIAGNOSIS — I890 Lymphedema, not elsewhere classified: Secondary | ICD-10-CM | POA: Diagnosis not present

## 2016-07-17 ENCOUNTER — Other Ambulatory Visit: Payer: Self-pay | Admitting: Family Medicine

## 2016-07-17 DIAGNOSIS — R609 Edema, unspecified: Secondary | ICD-10-CM

## 2016-07-19 NOTE — Telephone Encounter (Signed)
Jenni's pt.   Thanks,   -Laura  

## 2016-08-10 DIAGNOSIS — I890 Lymphedema, not elsewhere classified: Secondary | ICD-10-CM | POA: Diagnosis not present

## 2016-08-16 DIAGNOSIS — M25522 Pain in left elbow: Secondary | ICD-10-CM | POA: Diagnosis not present

## 2016-08-16 DIAGNOSIS — M7582 Other shoulder lesions, left shoulder: Secondary | ICD-10-CM | POA: Diagnosis not present

## 2016-08-16 DIAGNOSIS — S63042A Subluxation of carpometacarpal joint of left thumb, initial encounter: Secondary | ICD-10-CM | POA: Diagnosis not present

## 2016-09-10 ENCOUNTER — Ambulatory Visit (INDEPENDENT_AMBULATORY_CARE_PROVIDER_SITE_OTHER): Payer: BLUE CROSS/BLUE SHIELD | Admitting: Vascular Surgery

## 2016-09-29 ENCOUNTER — Other Ambulatory Visit: Payer: Self-pay | Admitting: Physician Assistant

## 2016-09-29 DIAGNOSIS — R12 Heartburn: Secondary | ICD-10-CM

## 2016-10-01 ENCOUNTER — Other Ambulatory Visit: Payer: Self-pay | Admitting: Physician Assistant

## 2016-10-01 DIAGNOSIS — R12 Heartburn: Secondary | ICD-10-CM

## 2016-10-01 MED ORDER — RANITIDINE HCL 150 MG PO CAPS: 150.0000 mg | ORAL_CAPSULE | Freq: Every day | ORAL | 5 refills | Status: DC | PRN

## 2016-10-01 NOTE — Telephone Encounter (Signed)
[facility] faxed a request on the following medication. Thanks CC   ranitidine (ZANTAC) 150 MG capsule  Take 1 tablet by mouth once daily if needed for heartburn

## 2016-10-04 ENCOUNTER — Encounter: Payer: Self-pay | Admitting: Physician Assistant

## 2016-10-04 ENCOUNTER — Ambulatory Visit (INDEPENDENT_AMBULATORY_CARE_PROVIDER_SITE_OTHER): Payer: BLUE CROSS/BLUE SHIELD | Admitting: Physician Assistant

## 2016-10-04 VITALS — BP 138/70 | HR 82 | Temp 98.3°F | Resp 16 | Wt 235.6 lb

## 2016-10-04 DIAGNOSIS — K219 Gastro-esophageal reflux disease without esophagitis: Secondary | ICD-10-CM | POA: Diagnosis not present

## 2016-10-04 DIAGNOSIS — H9192 Unspecified hearing loss, left ear: Secondary | ICD-10-CM | POA: Diagnosis not present

## 2016-10-04 DIAGNOSIS — M79632 Pain in left forearm: Secondary | ICD-10-CM | POA: Diagnosis not present

## 2016-10-04 DIAGNOSIS — M25512 Pain in left shoulder: Secondary | ICD-10-CM | POA: Diagnosis not present

## 2016-10-04 DIAGNOSIS — M7542 Impingement syndrome of left shoulder: Secondary | ICD-10-CM | POA: Diagnosis not present

## 2016-10-04 DIAGNOSIS — M25522 Pain in left elbow: Secondary | ICD-10-CM | POA: Diagnosis not present

## 2016-10-04 MED ORDER — OMEPRAZOLE 40 MG PO CPDR: 40.0000 mg | DELAYED_RELEASE_CAPSULE | Freq: Every day | ORAL | 3 refills | Status: DC

## 2016-10-04 NOTE — Progress Notes (Signed)
Patient: Rachael Brewer Female    DOB: July 10, 1960   56 y.o.   MRN: [id number] Visit Date: 10/04/2016  Today's Provider: Mar Daring, PA-C   No chief complaint on file.  Subjective:    HPI She reports three days ago she noticed she was not able to hear from her left ear. She denies URI. SOB with exertion. Feels like she just ran a mile but it was only walking causing the SOB. Sudden onset of hearing loss. Does report tinnitus. No dizziness or visual changes. She is established patient with Dr. Richardson Landry at [facility]. No medication changes.   Hearing Screening   125Hz  250Hz  500Hz  1000Hz  2000Hz  3000Hz  4000Hz  6000Hz  8000Hz   Right ear:           Left ear:   Fail Fail Fail  Fail      Patient needs refill on the Ranitidine. She reports that her GERD symptoms are about the same even with the Ranitidine. She only takes the medication prn.    Allergies  Allergen Reactions  . Sulfa Antibiotics Anaphylaxis  . Penicillins Hives  . Sumatriptan      Current Outpatient Prescriptions:  .  aspirin-acetaminophen-caffeine (EXCEDRIN MIGRAINE) 250-250-65 MG tablet, Take by mouth., Disp: , Rfl:  .  Azelastine HCl 0.15 % SOLN, Place into the nose., Disp: , Rfl:  .  baclofen (LIORESAL) 10 MG tablet, Take 10 mg by mouth as needed. , Disp: , Rfl:  .  escitalopram (LEXAPRO) 5 MG tablet, Take 1 tablet (5 mg total) by mouth daily., Disp: 30 tablet, Rfl: 5 .  gabapentin (NEURONTIN) 600 MG tablet, Take 600 mg by mouth 2 (two) times daily. , Disp: , Rfl:  .  levalbuterol (XOPENEX) 1.25 MG/3ML nebulizer solution, Take 1.25 mg by nebulization every 8 (eight) hours as needed for wheezing or shortness of breath., Disp: 72 mL, Rfl: 1 .  potassium chloride (MICRO-K) 10 MEQ CR capsule, Take 2 capsules (20 mEq total) by mouth daily., Disp: 60 capsule, Rfl: 5 .  ranitidine (ZANTAC) 150 MG capsule, Take 1 capsule (150 mg total) by mouth daily as needed for heartburn., Disp: 30 capsule, Rfl: 5 .   rizatriptan (MAXALT) 10 MG tablet, Take 1 tablet by mouth as needed., Disp: , Rfl: 0 .  topiramate (TOPAMAX) 200 MG tablet, Take 300 mg by mouth daily. , Disp: , Rfl:  .  torsemide (DEMADEX) 20 MG tablet, take 2 tablets by mouth once daily, Disp: 60 tablet, Rfl: 5 .  VENTOLIN HFA 108 (90 BASE) MCG/ACT inhaler, , Disp: , Rfl: 0 .  azithromycin (ZITHROMAX) 250 MG tablet, Take two tablets the first day, one tablet the following four days. (Patient not taking: Reported on 10/04/2016), Disp: 6 tablet, Rfl: 0 .  Cholecalciferol 1000 UNITS tablet, Take by mouth., Disp: , Rfl:  .  fluticasone (FLONASE) 50 MCG/ACT nasal spray, Place into the nose., Disp: , Rfl:  .  HYDROcodone-homatropine (HYCODAN) 5-1.5 MG/5ML syrup, Take 5 mLs by mouth every 8 (eight) hours as needed for cough. (Patient not taking: Reported on 10/04/2016), Disp: 120 mL, Rfl: 0 .  omeprazole (PRILOSEC) 40 MG capsule, take 1 capsule by mouth once daily  30 MINUTES PRIOR TO EVENING MEAL, Disp: , Rfl: 0  Current Facility-Administered Medications:  .  levalbuterol (XOPENEX) nebulizer solution 1.25 mg, 1.25 mg, Nebulization, Once, Adriana M Pollak, PA-C  Review of Systems  Constitutional: Positive for fatigue. Negative for chills and fever.  HENT: Positive for hearing  loss (left) and tinnitus (left). Negative for congestion, ear discharge, ear pain, postnasal drip, rhinorrhea, sinus pain, sinus pressure, sneezing, sore throat and trouble swallowing.   Respiratory: Positive for shortness of breath. Negative for cough, chest tightness and wheezing.   Cardiovascular: Negative for chest pain, palpitations and leg swelling.  Gastrointestinal: Negative for abdominal pain and nausea.  Neurological: Positive for headaches. Negative for dizziness and light-headedness.    Social History  Substance Use Topics  . Smoking status: Former Smoker    Quit date: 06/21/1999  . Smokeless tobacco: Never Used  . Alcohol use 0.0 oz/week     Comment:  occasional   Objective:   BP 138/70 (BP Location: Left Arm, Patient Position: Sitting, Cuff Size: Normal)   Pulse 82   Temp 98.3 F (36.8 C) (Oral)   Resp 16   Wt 235 lb 9.6 oz (106.9 kg)   LMP  (LMP Unknown)   BMI 40.44 kg/m  Vitals:   10/04/16 1403  BP: 138/70  Pulse: 82  Resp: 16  Temp: 98.3 F (36.8 C)  TempSrc: Oral  Weight: 235 lb 9.6 oz (106.9 kg)     Physical Exam  Constitutional: She appears well-developed and well-nourished. No distress.  HENT:  Head: Normocephalic and atraumatic.  Right Ear: Hearing, tympanic membrane, external ear and ear canal normal.  Left Ear: Hearing, tympanic membrane, external ear and ear canal normal.  Nose: Nose normal. Right sinus exhibits no maxillary sinus tenderness and no frontal sinus tenderness. Left sinus exhibits no maxillary sinus tenderness and no frontal sinus tenderness.  Mouth/Throat: Uvula is midline, oropharynx is clear and moist and mucous membranes are normal. No oropharyngeal exudate, posterior oropharyngeal edema or posterior oropharyngeal erythema.  Eyes: Conjunctivae are normal. Pupils are equal, round, and reactive to light. Right eye exhibits no discharge. Left eye exhibits no discharge. No scleral icterus.  Neck: Normal range of motion. Neck supple. No tracheal deviation present. No thyromegaly present.  Cardiovascular: Normal rate, regular rhythm and normal heart sounds.  Exam reveals no gallop and no friction rub.   No murmur heard. Pulmonary/Chest: Effort normal and breath sounds normal. No stridor. No respiratory distress. She has no wheezes. She has no rales.  Lymphadenopathy:    She has no cervical adenopathy.  Skin: Skin is warm and dry. She is not diaphoretic.  Vitals reviewed.     Assessment & Plan:     1. Hearing loss of left ear, unspecified hearing loss type Unsure of cause, suspect possible neuritis from unspecified cause. Call placed to [facility] and they are going to see patient on Weds,  10/06/16.  2. Gastroesophageal reflux disease, esophagitis presence not specified Unchanged with ranitidine. Will add prilosec as below. I will see her back in 3 months to recheck her progress with treatment and hopefully be able to discontinue. She is to call if symptoms worsen in the meantime. - omeprazole (PRILOSEC) 40 MG capsule; Take 1 capsule (40 mg total) by mouth daily.  Dispense: 30 capsule; Refill: [place], PA-C  [facility]

## 2016-10-06 DIAGNOSIS — H9122 Sudden idiopathic hearing loss, left ear: Secondary | ICD-10-CM | POA: Diagnosis not present

## 2016-10-06 DIAGNOSIS — H90A22 Sensorineural hearing loss, unilateral, left ear, with restricted hearing on the contralateral side: Secondary | ICD-10-CM | POA: Diagnosis not present

## 2016-10-08 ENCOUNTER — Ambulatory Visit (INDEPENDENT_AMBULATORY_CARE_PROVIDER_SITE_OTHER): Payer: BLUE CROSS/BLUE SHIELD | Admitting: Vascular Surgery

## 2016-10-11 DIAGNOSIS — M25522 Pain in left elbow: Secondary | ICD-10-CM | POA: Diagnosis not present

## 2016-10-11 DIAGNOSIS — M79632 Pain in left forearm: Secondary | ICD-10-CM | POA: Diagnosis not present

## 2016-10-11 DIAGNOSIS — M25512 Pain in left shoulder: Secondary | ICD-10-CM | POA: Diagnosis not present

## 2016-10-11 DIAGNOSIS — M7542 Impingement syndrome of left shoulder: Secondary | ICD-10-CM | POA: Diagnosis not present

## 2016-10-13 DIAGNOSIS — M25512 Pain in left shoulder: Secondary | ICD-10-CM | POA: Diagnosis not present

## 2016-10-13 DIAGNOSIS — M79632 Pain in left forearm: Secondary | ICD-10-CM | POA: Diagnosis not present

## 2016-10-13 DIAGNOSIS — M25522 Pain in left elbow: Secondary | ICD-10-CM | POA: Diagnosis not present

## 2016-10-13 DIAGNOSIS — M7542 Impingement syndrome of left shoulder: Secondary | ICD-10-CM | POA: Diagnosis not present

## 2016-10-18 DIAGNOSIS — M25522 Pain in left elbow: Secondary | ICD-10-CM | POA: Diagnosis not present

## 2016-10-18 DIAGNOSIS — M79632 Pain in left forearm: Secondary | ICD-10-CM | POA: Diagnosis not present

## 2016-10-18 DIAGNOSIS — M25512 Pain in left shoulder: Secondary | ICD-10-CM | POA: Diagnosis not present

## 2016-10-18 DIAGNOSIS — M7542 Impingement syndrome of left shoulder: Secondary | ICD-10-CM | POA: Diagnosis not present

## 2016-10-21 DIAGNOSIS — M25522 Pain in left elbow: Secondary | ICD-10-CM | POA: Diagnosis not present

## 2016-10-21 DIAGNOSIS — M25512 Pain in left shoulder: Secondary | ICD-10-CM | POA: Diagnosis not present

## 2016-10-21 DIAGNOSIS — H9120 Sudden idiopathic hearing loss, unspecified ear: Secondary | ICD-10-CM | POA: Diagnosis not present

## 2016-10-21 DIAGNOSIS — M7542 Impingement syndrome of left shoulder: Secondary | ICD-10-CM | POA: Diagnosis not present

## 2016-10-21 DIAGNOSIS — M79632 Pain in left forearm: Secondary | ICD-10-CM | POA: Diagnosis not present

## 2016-10-21 DIAGNOSIS — H903 Sensorineural hearing loss, bilateral: Secondary | ICD-10-CM | POA: Diagnosis not present

## 2016-10-25 DIAGNOSIS — M7542 Impingement syndrome of left shoulder: Secondary | ICD-10-CM | POA: Diagnosis not present

## 2016-10-25 DIAGNOSIS — M25522 Pain in left elbow: Secondary | ICD-10-CM | POA: Diagnosis not present

## 2016-10-25 DIAGNOSIS — M25512 Pain in left shoulder: Secondary | ICD-10-CM | POA: Diagnosis not present

## 2016-10-25 DIAGNOSIS — M79632 Pain in left forearm: Secondary | ICD-10-CM | POA: Diagnosis not present

## 2016-10-27 DIAGNOSIS — M79632 Pain in left forearm: Secondary | ICD-10-CM | POA: Diagnosis not present

## 2016-10-27 DIAGNOSIS — M7542 Impingement syndrome of left shoulder: Secondary | ICD-10-CM | POA: Diagnosis not present

## 2016-10-27 DIAGNOSIS — M25522 Pain in left elbow: Secondary | ICD-10-CM | POA: Diagnosis not present

## 2016-10-27 DIAGNOSIS — M25512 Pain in left shoulder: Secondary | ICD-10-CM | POA: Diagnosis not present

## 2016-11-01 DIAGNOSIS — M25522 Pain in left elbow: Secondary | ICD-10-CM | POA: Diagnosis not present

## 2016-11-01 DIAGNOSIS — M25512 Pain in left shoulder: Secondary | ICD-10-CM | POA: Diagnosis not present

## 2016-11-01 DIAGNOSIS — M7542 Impingement syndrome of left shoulder: Secondary | ICD-10-CM | POA: Diagnosis not present

## 2016-11-01 DIAGNOSIS — M79632 Pain in left forearm: Secondary | ICD-10-CM | POA: Diagnosis not present

## 2016-11-04 DIAGNOSIS — M25522 Pain in left elbow: Secondary | ICD-10-CM | POA: Diagnosis not present

## 2016-11-04 DIAGNOSIS — M25512 Pain in left shoulder: Secondary | ICD-10-CM | POA: Diagnosis not present

## 2016-11-04 DIAGNOSIS — M79632 Pain in left forearm: Secondary | ICD-10-CM | POA: Diagnosis not present

## 2016-11-04 DIAGNOSIS — M7542 Impingement syndrome of left shoulder: Secondary | ICD-10-CM | POA: Diagnosis not present

## 2016-12-01 ENCOUNTER — Other Ambulatory Visit: Payer: Self-pay | Admitting: Otolaryngology

## 2016-12-01 DIAGNOSIS — H8109 Meniere's disease, unspecified ear: Secondary | ICD-10-CM | POA: Diagnosis not present

## 2016-12-01 DIAGNOSIS — H903 Sensorineural hearing loss, bilateral: Secondary | ICD-10-CM | POA: Diagnosis not present

## 2016-12-08 ENCOUNTER — Ambulatory Visit: Payer: BLUE CROSS/BLUE SHIELD

## 2016-12-13 DIAGNOSIS — G43839 Menstrual migraine, intractable, without status migrainosus: Secondary | ICD-10-CM | POA: Diagnosis not present

## 2016-12-13 DIAGNOSIS — G43719 Chronic migraine without aura, intractable, without status migrainosus: Secondary | ICD-10-CM | POA: Diagnosis not present

## 2016-12-13 DIAGNOSIS — G43019 Migraine without aura, intractable, without status migrainosus: Secondary | ICD-10-CM | POA: Diagnosis not present

## 2016-12-25 ENCOUNTER — Other Ambulatory Visit: Payer: Self-pay | Admitting: Physician Assistant

## 2016-12-25 DIAGNOSIS — R609 Edema, unspecified: Secondary | ICD-10-CM

## 2016-12-25 NOTE — Telephone Encounter (Signed)
[facility] faxed refill request for a 30 day supply of the following medications:  potassium chloride (MICRO-K) 10 MEQ CR capsule  Last Rx: 12/15/15 written by Dr. Venia Minks LOV: 10/04/16 Please advise. Thanks TNP

## 2016-12-27 MED ORDER — POTASSIUM CHLORIDE ER 10 MEQ PO CPCR: 20.0000 meq | ORAL_CAPSULE | Freq: Every day | ORAL | 5 refills | Status: DC

## 2016-12-27 NOTE — Telephone Encounter (Signed)
Please Review.  Thanks. 

## 2017-01-14 ENCOUNTER — Other Ambulatory Visit: Payer: Self-pay | Admitting: Otolaryngology

## 2017-01-14 DIAGNOSIS — H8109 Meniere's disease, unspecified ear: Secondary | ICD-10-CM | POA: Diagnosis not present

## 2017-01-14 DIAGNOSIS — J301 Allergic rhinitis due to pollen: Secondary | ICD-10-CM | POA: Diagnosis not present

## 2017-01-14 DIAGNOSIS — H903 Sensorineural hearing loss, bilateral: Secondary | ICD-10-CM

## 2017-01-19 ENCOUNTER — Ambulatory Visit
Admission: RE | Admit: 2017-01-19 | Discharge: 2017-01-19 | Disposition: A | Discharge status: Home / Self Care | Payer: BLUE CROSS/BLUE SHIELD | Source: Ambulatory Visit | Attending: Otolaryngology | Admitting: Otolaryngology

## 2017-01-19 DIAGNOSIS — I739 Peripheral vascular disease, unspecified: Secondary | ICD-10-CM | POA: Diagnosis not present

## 2017-01-19 DIAGNOSIS — H9042 Sensorineural hearing loss, unilateral, left ear, with unrestricted hearing on the contralateral side: Secondary | ICD-10-CM | POA: Diagnosis present

## 2017-01-19 DIAGNOSIS — H903 Sensorineural hearing loss, bilateral: Secondary | ICD-10-CM

## 2017-01-19 DIAGNOSIS — H9192 Unspecified hearing loss, left ear: Secondary | ICD-10-CM | POA: Diagnosis not present

## 2017-01-19 LAB — POCT I-STAT CREATININE: Creatinine, Ser: 0.9 mg/dL (ref 0.44–1.00)

## 2017-01-19 MED ORDER — GADOBENATE DIMEGLUMINE 529 MG/ML IV SOLN
20.0000 mL | Freq: Once | INTRAVENOUS | Status: AC | PRN
  Administered 2017-01-19: 20 mL via INTRAVENOUS

## 2017-01-27 ENCOUNTER — Other Ambulatory Visit: Payer: Self-pay | Admitting: Family Medicine

## 2017-01-27 DIAGNOSIS — R609 Edema, unspecified: Secondary | ICD-10-CM

## 2017-01-31 ENCOUNTER — Other Ambulatory Visit: Payer: Self-pay | Admitting: Physician Assistant

## 2017-01-31 DIAGNOSIS — F3342 Major depressive disorder, recurrent, in full remission: Secondary | ICD-10-CM

## 2017-02-26 ENCOUNTER — Other Ambulatory Visit: Payer: Self-pay | Admitting: Physician Assistant

## 2017-02-26 DIAGNOSIS — K219 Gastro-esophageal reflux disease without esophagitis: Secondary | ICD-10-CM

## 2017-03-02 DIAGNOSIS — H5203 Hypermetropia, bilateral: Secondary | ICD-10-CM | POA: Diagnosis not present

## 2017-03-02 DIAGNOSIS — Z83511 Family history of glaucoma: Secondary | ICD-10-CM | POA: Diagnosis not present

## 2017-03-02 DIAGNOSIS — H40013 Open angle with borderline findings, low risk, bilateral: Secondary | ICD-10-CM | POA: Diagnosis not present

## 2017-04-13 ENCOUNTER — Ambulatory Visit: Payer: BLUE CROSS/BLUE SHIELD

## 2017-04-13 ENCOUNTER — Ambulatory Visit (INDEPENDENT_AMBULATORY_CARE_PROVIDER_SITE_OTHER): Payer: BLUE CROSS/BLUE SHIELD

## 2017-04-13 ENCOUNTER — Ambulatory Visit (INDEPENDENT_AMBULATORY_CARE_PROVIDER_SITE_OTHER): Payer: BLUE CROSS/BLUE SHIELD | Admitting: Podiatry

## 2017-04-13 DIAGNOSIS — M722 Plantar fascial fibromatosis: Secondary | ICD-10-CM | POA: Diagnosis not present

## 2017-04-13 NOTE — Progress Notes (Signed)
She presents today states that she is here for plantar flareup of her plantar fasciitis. She states this been going on now for the past 2-3 months. States the right foot is worse than the left foot is very sore even when standing up or even sitting down. Walking for long period of time is painful she has 3 boys that she has to keep up with. She's done nothing to treat it. We discussed the use of her orthotics and her new shoes.  Objective: Vital signs are stable alert and oriented 3. Pulses are palpable. Neurologic sensorium is intact. Deep tendon reflexes are intact. Muscle strength is normal symmetrical bilateral. She has pain on palpation medial calcaneal tubercles bilateral.  Assessment: Plantar fasciitis.  Plan: Injected the bilateral heels today with Kenalog and local anesthetic placed her plantar fascial braces. Follow up with her as needed. She will continue to wear her orthotics.

## 2017-05-02 ENCOUNTER — Encounter: Payer: Self-pay | Admitting: Physician Assistant

## 2017-05-02 DIAGNOSIS — H8109 Meniere's disease, unspecified ear: Secondary | ICD-10-CM | POA: Diagnosis not present

## 2017-05-02 DIAGNOSIS — H903 Sensorineural hearing loss, bilateral: Secondary | ICD-10-CM | POA: Diagnosis not present

## 2017-05-11 ENCOUNTER — Encounter: Payer: Self-pay | Admitting: Family Medicine

## 2017-05-11 ENCOUNTER — Ambulatory Visit (INDEPENDENT_AMBULATORY_CARE_PROVIDER_SITE_OTHER): Payer: BLUE CROSS/BLUE SHIELD | Admitting: Family Medicine

## 2017-05-11 VITALS — BP 100/62 | HR 60 | Temp 98.5°F | Resp 12 | Wt 218.4 lb

## 2017-05-11 DIAGNOSIS — Z1239 Encounter for other screening for malignant neoplasm of breast: Secondary | ICD-10-CM

## 2017-05-11 DIAGNOSIS — Z23 Encounter for immunization: Secondary | ICD-10-CM | POA: Diagnosis not present

## 2017-05-11 DIAGNOSIS — Z1231 Encounter for screening mammogram for malignant neoplasm of breast: Secondary | ICD-10-CM

## 2017-05-11 DIAGNOSIS — F3342 Major depressive disorder, recurrent, in full remission: Secondary | ICD-10-CM

## 2017-05-11 DIAGNOSIS — E669 Obesity, unspecified: Secondary | ICD-10-CM | POA: Diagnosis not present

## 2017-05-11 DIAGNOSIS — E7800 Pure hypercholesterolemia, unspecified: Secondary | ICD-10-CM

## 2017-05-11 DIAGNOSIS — Z0000 Encounter for general adult medical examination without abnormal findings: Secondary | ICD-10-CM | POA: Diagnosis not present

## 2017-05-11 DIAGNOSIS — E66812 Obesity, class 2: Secondary | ICD-10-CM

## 2017-05-11 DIAGNOSIS — F339 Major depressive disorder, recurrent, unspecified: Secondary | ICD-10-CM | POA: Diagnosis not present

## 2017-05-11 DIAGNOSIS — Z6837 Body mass index (BMI) 37.0-37.9, adult: Secondary | ICD-10-CM

## 2017-05-11 DIAGNOSIS — R5382 Chronic fatigue, unspecified: Secondary | ICD-10-CM | POA: Diagnosis not present

## 2017-05-11 DIAGNOSIS — R739 Hyperglycemia, unspecified: Secondary | ICD-10-CM

## 2017-05-11 MED ORDER — ESCITALOPRAM OXALATE 10 MG PO TABS: 10.0000 mg | ORAL_TABLET | Freq: Every day | ORAL | 1 refills | Status: DC
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
## 2017-05-11 NOTE — Assessment & Plan Note (Signed)
Discussed diet and exercise, as well as healthy weight

## 2017-05-11 NOTE — Assessment & Plan Note (Signed)
Plenty of stress that is contributing Uncontrolled - see PHQ9 score Increase lexapro to 10mg  daily Discussed possible side effects Advised on counseling No SI/HI F/u in 1 month and consider further dose titration

## 2017-05-11 NOTE — Assessment & Plan Note (Signed)
Check A1c. 

## 2017-05-11 NOTE — Patient Instructions (Signed)
Preventive Care 40-64 Years, Female Preventive care refers to lifestyle choices and visits with your health care provider that can promote health and wellness. What does preventive care include?  A yearly physical exam. This is also called an annual well check.  Dental exams once or twice a year.  Routine eye exams. Ask your health care provider how often you should have your eyes checked.  Personal lifestyle choices, including: ? Daily care of your teeth and gums. ? Regular physical activity. ? Eating a healthy diet. ? Avoiding tobacco and drug use. ? Limiting alcohol use. ? Practicing safe sex. ? Taking low-dose aspirin daily starting at age 57. ? Taking vitamin and mineral supplements as recommended by your health care provider. What happens during an annual well check? The services and screenings done by your health care provider during your annual well check will depend on your age, overall health, lifestyle risk factors, and family history of disease. Counseling Your health care provider may ask you questions about your:  Alcohol use.  Tobacco use.  Drug use.  Emotional well-being.  Home and relationship well-being.  Sexual activity.  Eating habits.  Work and work Statistician.  Method of birth control.  Menstrual cycle.  Pregnancy history.  Screening You may have the following tests or measurements:  Height, weight, and BMI.  Blood pressure.  Lipid and cholesterol levels. These may be checked every 5 years, or more frequently if you are over 57 years old.  Skin check.  Lung cancer screening. You may have this screening every year starting at age 57 if you have a 30-pack-year history of smoking and currently smoke or have quit within the past 15 years.  Fecal occult blood test (FOBT) of the stool. You may have this test every year starting at age 57.  Flexible sigmoidoscopy or colonoscopy. You may have a sigmoidoscopy every 5 years or a colonoscopy  every 10 years starting at age 57.  Hepatitis C blood test.  Hepatitis B blood test.  Sexually transmitted disease (STD) testing.  Diabetes screening. This is done by checking your blood sugar (glucose) after you have not eaten for a while (fasting). You may have this done every 1-3 years.  Mammogram. This may be done every 1-2 years. Talk to your health care provider about when you should start having regular mammograms. This may depend on whether you have a family history of breast cancer.  BRCA-related cancer screening. This may be done if you have a family history of breast, ovarian, tubal, or peritoneal cancers.  Pelvic exam and Pap test. This may be done every 3 years starting at age 57. Starting at age 57, this may be done every 5 years if you have a Pap test in combination with an HPV test.  Bone density scan. This is done to screen for osteoporosis. You may have this scan if you are at high risk for osteoporosis.  Discuss your test results, treatment options, and if necessary, the need for more tests with your health care provider. Vaccines Your health care provider may recommend certain vaccines, such as:  Influenza vaccine. This is recommended every year.  Tetanus, diphtheria, and acellular pertussis (Tdap, Td) vaccine. You may need a Td booster every 10 years.  Varicella vaccine. You may need this if you have not been vaccinated.  Zoster vaccine. You may need this after age 57.  Measles, mumps, and rubella (MMR) vaccine. You may need at least one dose of MMR if you were born in  1958 or later. You may also need a second dose.  Pneumococcal 13-valent conjugate (PCV13) vaccine. You may need this if you have certain conditions and were not previously vaccinated.  Pneumococcal polysaccharide (PPSV23) vaccine. You may need one or two doses if you smoke cigarettes or if you have certain conditions.  Meningococcal vaccine. You may need this if you have certain  conditions.  Hepatitis A vaccine. You may need this if you have certain conditions or if you travel or work in places where you may be exposed to hepatitis A.  Hepatitis B vaccine. You may need this if you have certain conditions or if you travel or work in places where you may be exposed to hepatitis B.  Haemophilus influenzae type b (Hib) vaccine. You may need this if you have certain conditions.  Talk to your health care provider about which screenings and vaccines you need and how often you need them. This information is not intended to replace advice given to you by your health care provider. Make sure you discuss any questions you have with your health care provider. Document Released: 07/04/2015 Document Revised: 02/25/2016 Document Reviewed: 04/08/2015 Elsevier Interactive Patient Education  2017 [employer].

## 2017-05-11 NOTE — Assessment & Plan Note (Signed)
Most likely related to depression Treat as above Also check Vit D, TSH, CBC, CMP

## 2017-05-11 NOTE — Assessment & Plan Note (Signed)
Recheck lipid panel 

## 2017-05-13 DIAGNOSIS — R5382 Chronic fatigue, unspecified: Secondary | ICD-10-CM | POA: Diagnosis not present

## 2017-05-13 DIAGNOSIS — E7800 Pure hypercholesterolemia, unspecified: Secondary | ICD-10-CM | POA: Diagnosis not present

## 2017-05-13 DIAGNOSIS — Z0000 Encounter for general adult medical examination without abnormal findings: Secondary | ICD-10-CM | POA: Diagnosis not present

## 2017-05-13 DIAGNOSIS — R739 Hyperglycemia, unspecified: Secondary | ICD-10-CM | POA: Diagnosis not present

## 2017-05-14 LAB — COMPLETE METABOLIC PANEL WITH GFR
AG RATIO: 1.6 (calc) (ref 1.0–2.5)
ALT: 18 U/L (ref 6–29)
AST: 22 U/L (ref 10–35)
Albumin: 4.1 g/dL (ref 3.6–5.1)
Alkaline phosphatase (APISO): 47 U/L (ref 33–130)
BILIRUBIN TOTAL: 0.5 mg/dL (ref 0.2–1.2)
BUN/Creatinine Ratio: 30 (calc) — ABNORMAL HIGH (ref 6–22)
BUN: 28 mg/dL — ABNORMAL HIGH (ref 7–25)
CALCIUM: 9.2 mg/dL (ref 8.6–10.4)
CHLORIDE: 100 mmol/L (ref 98–110)
CO2: 33 mmol/L — ABNORMAL HIGH (ref 20–32)
Creat: 0.92 mg/dL (ref 0.50–1.05)
GFR, EST NON AFRICAN AMERICAN: 69 mL/min/{1.73_m2} (ref >=60)
GFR, Est African American: 80 mL/min/{1.73_m2} (ref >=60)
Globulin: 2.5 g/dL (calc) (ref 1.9–3.7)
Glucose, Bld: 106 mg/dL — ABNORMAL HIGH (ref 65–99)
Potassium: 2.8 mmol/L — ABNORMAL LOW (ref 3.5–5.3)
Sodium: 139 mmol/L (ref 135–146)
Total Protein: 6.6 g/dL (ref 6.1–8.1)

## 2017-05-14 LAB — CBC WITH DIFFERENTIAL/PLATELET
Basophils Absolute: 53 {cells}/uL (ref 0–200)
Basophils Relative: 0.8 %
Eosinophils Absolute: 172 {cells}/uL (ref 15–500)
Eosinophils Relative: 2.6 %
HCT: 43.3 % (ref 35.0–45.0)
Hemoglobin: 14.7 g/dL (ref 11.7–15.5)
Lymphs Abs: 1967 {cells}/uL (ref 850–3900)
MCH: 31.1 pg (ref 27.0–33.0)
MCHC: 33.9 g/dL (ref 32.0–36.0)
MCV: 91.7 fL (ref 80.0–100.0)
MPV: 11.2 fL (ref 7.5–12.5)
Monocytes Relative: 9.8 %
Neutro Abs: 3762 {cells}/uL (ref 1500–7800)
Neutrophils Relative %: 57 %
Platelets: 291 10*3/uL (ref 140–400)
RBC: 4.72 Million/uL (ref 3.80–5.10)
RDW: 12.5 % (ref 11.0–15.0)
Total Lymphocyte: 29.8 %
WBC mixed population: 647 {cells}/uL (ref 200–950)
WBC: 6.6 10*3/uL (ref 3.8–10.8)

## 2017-05-14 LAB — HEMOGLOBIN A1C
EAG (MMOL/L): 6.5 (calc)
HEMOGLOBIN A1C: 5.7 %{Hb} — AB (ref <5.7)
MEAN PLASMA GLUCOSE: 117 (calc)

## 2017-05-14 LAB — LIPID PANEL
CHOL/HDL RATIO: 2.8 (calc) (ref <5.0)
Cholesterol: 139 mg/dL (ref <200)
HDL: 50 mg/dL — AB (ref >50)
LDL Cholesterol (Calc): 71 mg/dL (calc)
NON-HDL CHOLESTEROL (CALC): 89 mg/dL (ref <130)
TRIGLYCERIDES: 97 mg/dL (ref <150)

## 2017-05-14 LAB — TSH: TSH: 1.35 m[IU]/L (ref 0.40–4.50)

## 2017-05-14 LAB — VITAMIN D 25 HYDROXY (VIT D DEFICIENCY, FRACTURES): VIT D 25 HYDROXY: 35 ng/mL (ref 30–100)

## 2017-05-16 ENCOUNTER — Ambulatory Visit: Payer: BLUE CROSS/BLUE SHIELD | Admitting: Podiatry

## 2017-05-16 ENCOUNTER — Encounter: Payer: Self-pay | Admitting: Podiatry

## 2017-05-16 ENCOUNTER — Telehealth: Payer: Self-pay

## 2017-05-16 DIAGNOSIS — M722 Plantar fascial fibromatosis: Secondary | ICD-10-CM

## 2017-05-16 MED ORDER — POTASSIUM CHLORIDE CRYS ER 20 MEQ PO TBCR: 40.0000 meq | EXTENDED_RELEASE_TABLET | Freq: Once | ORAL | 3 refills | Status: DC

## 2017-05-16 NOTE — Progress Notes (Signed)
She presents for follow-up of her bilateral plantar fasciitis. She states that she is doing very well with the left foot no pain whatsoever she states the right foot is approximately 50% improved but this of course is the foot that was most painful.  Objective: Vital signs are stable alert and oriented 3. Pulses are strong and palpable. She has no pain on palpation medial calcaneal tubercle of the left heel. The right heel does still demonstrate warmth and pain on palpation of that medial calcaneal tubercle of the right heel. Central calcaneal tubercle is nontender.  Assessment: Plantar fasciitis right heel proximal 50% improved left heel approximately 100% improved.  Plan: I only injected the right heel today at the point of maximal tenderness plantar fascial calcaneal insertion site medial calcaneal tubercle. This is performed after oral consent was achieved. The area was prepped in the normal sterile fashion with alcohol and Betadine. The area was then injected with 20 mg of Kenalog and 5 mg of local anesthetic. I will follow-up with her in 1 month if necessary. She will continue all conservative therapies. She tolerated this procedure well today.

## 2017-05-16 NOTE — Telephone Encounter (Signed)
Pt advised. New rx sent to pharmacy. Pt states she was only taking 10 MEQ of potassium daily. Per Dr. Jacinto Reap, pt is still to start 40 meq.

## 2017-05-16 NOTE — Telephone Encounter (Signed)
lmtcb

## 2017-05-16 NOTE — Telephone Encounter (Signed)
-----   Message from Virginia Crews, MD sent at 05/16/2017  8:27 AM EST ----- Normal blood counts.  Cholesterol is good. A1c is in prediabetes range.  Watch carbohydrate intake.  Normal Vit D level. Normal thyroid function.  Kidney function is normal.  Potassium level is low.  If patient is not taking potassium supplement, needs to resume.  If she is taking, we should increase to 59mEq daily.  We will recheck at 1 month f/u visit.  Virginia Crews, MD, MPH [facility] 05/16/2017 8:27 AM

## 2017-05-29 ENCOUNTER — Other Ambulatory Visit: Payer: Self-pay | Admitting: Physician Assistant

## 2017-05-29 DIAGNOSIS — F3342 Major depressive disorder, recurrent, in full remission: Secondary | ICD-10-CM

## 2017-06-08 ENCOUNTER — Ambulatory Visit: Payer: BLUE CROSS/BLUE SHIELD | Admitting: Family Medicine

## 2017-06-08 DIAGNOSIS — G43019 Migraine without aura, intractable, without status migrainosus: Secondary | ICD-10-CM | POA: Diagnosis not present

## 2017-06-08 DIAGNOSIS — G43719 Chronic migraine without aura, intractable, without status migrainosus: Secondary | ICD-10-CM | POA: Diagnosis not present

## 2017-06-08 DIAGNOSIS — G43839 Menstrual migraine, intractable, without status migrainosus: Secondary | ICD-10-CM | POA: Diagnosis not present

## 2017-06-09 ENCOUNTER — Ambulatory Visit: Payer: BLUE CROSS/BLUE SHIELD | Admitting: Family Medicine

## 2017-06-22 ENCOUNTER — Ambulatory Visit: Payer: BLUE CROSS/BLUE SHIELD | Admitting: Family Medicine

## 2017-06-22 NOTE — Progress Notes (Deleted)
Patient: Rachael Brewer Female    DOB: [birth year]   57 y.o.   MRN: [id number] Visit Date: 06/22/2017  Today's Provider: Lavon Paganini, MD   I, Martha Clan, CMA, am acting as scribe for Lavon Paganini, MD.  No chief complaint on file.  Subjective:    HPI     Depression, Follow-up  She  was last seen for this 4 weeks ago. Changes made at last visit include increasing Lexapro to 10 mg.   She reports {excellent/good/fair/poor:19665} compliance with treatment. She {ACTION; IS/IS VZD:[id number]} having side effects. ***  She reports {DESC; GOOD/FAIR/POOR:18685} tolerance of treatment. Current symptoms include: {Symptoms; depression:1002} She feels she is {improved/worse/unchanged:3041574} since last visit.  ------------------------------------------------------------------------   Follow up for Hypokalemia  The patient was last seen for this 4 weeks ago. Changes made at last visit include starting Klor Con 40 mEq qd.  She reports {excellent/good/fair/poor:19665} compliance with treatment. She feels that condition is {improved/worse/unchanged:3041574}. She {ACTION; IS/IS PIR:[id number]} having side effects. ***  ------------------------------------------------------------------------------------    Allergies  Allergen Reactions  . Sulfa Antibiotics Anaphylaxis  . Penicillins Hives  . Sumatriptan      Current Outpatient Medications:  .  aspirin-acetaminophen-caffeine (EXCEDRIN MIGRAINE) 250-250-65 MG tablet, Take by mouth., Disp: , Rfl:  .  Azelastine HCl 0.15 % SOLN, Place into the nose., Disp: , Rfl:  .  baclofen (LIORESAL) 10 MG tablet, Take 10 mg by mouth as needed. , Disp: , Rfl:  .  Cholecalciferol 1000 UNITS tablet, Take by mouth., Disp: , Rfl:  .  escitalopram (LEXAPRO) 10 MG tablet, Take 1 tablet (10 mg total) by mouth daily., Disp: 30 tablet, Rfl: 1 .  escitalopram (LEXAPRO) 5 MG tablet, TAKE 1 TABLET BY MOUTH EVERY DAY, Disp: 30 tablet,  Rfl: 5 .  FLUARIX QUADRIVALENT 0.5 ML injection, ADM 0.5ML IM UTD, Disp: , Rfl: 0 .  gabapentin (NEURONTIN) 600 MG tablet, Take 600 mg by mouth 2 (two) times daily. , Disp: , Rfl:  .  hydrochlorothiazide (MICROZIDE) 12.5 MG capsule, TK 1 C PO D, Disp: , Rfl: 3 .  levalbuterol (XOPENEX) 1.25 MG/3ML nebulizer solution, Take 1.25 mg by nebulization every 8 (eight) hours as needed for wheezing or shortness of breath., Disp: 72 mL, Rfl: 1 .  omeprazole (PRILOSEC) 40 MG capsule, TAKE 1 CAPSULE BY MOUTH ONCE DAILY, Disp: 90 capsule, Rfl: 1 .  potassium chloride SA (K-DUR,KLOR-CON) 20 MEQ tablet, Take 2 tablets (40 mEq total) by mouth once for 1 dose., Disp: 60 tablet, Rfl: 3 .  rizatriptan (MAXALT) 10 MG tablet, Take 1 tablet by mouth as needed., Disp: , Rfl: 0 .  topiramate (TOPAMAX) 200 MG tablet, Take 300 mg by mouth daily. , Disp: , Rfl:  .  torsemide (DEMADEX) 20 MG tablet, TAKE 2 TABLETS BY MOUTH ONCE DAILY, Disp: 60 tablet, Rfl: 5 .  VENTOLIN HFA 108 (90 BASE) MCG/ACT inhaler, , Disp: , Rfl: 0  Review of Systems  Social History   Tobacco Use  . Smoking status: Former Smoker    Packs/day: 1.00    Years: 15.00    Pack years: 15.00    Last attempt to quit: 06/21/1999    Years since quitting: 18.0  . Smokeless tobacco: Never Used  Substance Use Topics  . Alcohol use: Yes    Alcohol/week: 0.0 oz    Comment: occasional   Objective:   LMP  (LMP Unknown)  There were no vitals filed for this visit.   Physical Exam  Assessment & Plan:

## 2017-06-24 ENCOUNTER — Encounter: Payer: Self-pay | Admitting: Family Medicine

## 2017-06-24 ENCOUNTER — Ambulatory Visit: Payer: BLUE CROSS/BLUE SHIELD | Admitting: Family Medicine

## 2017-06-24 VITALS — BP 100/70 | HR 59 | Temp 98.5°F | Resp 16 | Wt 225.0 lb

## 2017-06-24 DIAGNOSIS — F339 Major depressive disorder, recurrent, unspecified: Secondary | ICD-10-CM | POA: Diagnosis not present

## 2017-06-24 DIAGNOSIS — E876 Hypokalemia: Secondary | ICD-10-CM

## 2017-06-24 DIAGNOSIS — R0681 Apnea, not elsewhere classified: Secondary | ICD-10-CM

## 2017-06-24 DIAGNOSIS — R5382 Chronic fatigue, unspecified: Secondary | ICD-10-CM | POA: Diagnosis not present

## 2017-06-24 MED ORDER — ESCITALOPRAM OXALATE 20 MG PO TABS: 20.0000 mg | ORAL_TABLET | Freq: Every day | ORAL | 2 refills | Status: DC

## 2017-06-24 NOTE — Assessment & Plan Note (Signed)
Continue CPAP Patient reports compliance with CPAP Referral to sleep medicine as above as patient continues to have nonrestorative sleep

## 2017-06-24 NOTE — Assessment & Plan Note (Signed)
Chronic and multifactorial problem Recent labs wnl Using CPAP as presribed Most likely related to depression and stress Will refer to sleep medicine for further eval

## 2017-06-24 NOTE — Progress Notes (Signed)
Patient: Rachael Brewer Female    DOB: 10/10/1960   57 y.o.   MRN: [id number] Visit Date: 06/24/2017  Today's Provider: Lavon Paganini, MD   I, Martha Clan, CMA, am acting as scribe for Lavon Paganini, MD.  Chief Complaint  Patient presents with  . Depression   Subjective:    Depression         Chronicity: FU from 05/11/2017; Lexapro was increased to 10 mg at that time.  The problem has been gradually improving since onset.  Associated symptoms include decreased concentration, fatigue, hopelessness, irritable, decreased interest, body aches, headaches (migraines) and sad.  Associated symptoms include no helplessness, does not have insomnia, no restlessness, no appetite change and no suicidal ideas.     The symptoms are aggravated by family issues (has custody of grandchildren).  Past treatments include SSRIs - Selective serotonin reuptake inhibitors.  Compliance with treatment is good.  Previous treatment provided mild relief.  Pt's potassium was low at last lab check. Klor Con was increased to 40 mEq; pt is taking 20 mEq BID.  OSA: Using CPAP.  Still has nonrestorative sleep.  Feels tired every afternoon and has to take a nap.  She states that her children wear her out.  Depression screen [facility] 2/9 06/24/2017 05/11/2017 12/15/2015  Decreased Interest 1 3 3   Down, Depressed, Hopeless 1 3 0  PHQ - 2 Score 2 6 3   Altered sleeping 2 0 0  Tired, decreased energy 3 3 3   Change in appetite 3 0 3  Feeling bad or failure about yourself  3 1 3   Trouble concentrating 1 0 0  Moving slowly or fidgety/restless 0 1 0  Suicidal thoughts 0 0 0  PHQ-9 Score 14 11 12   Difficult doing work/chores Very difficult Somewhat difficult Not difficult at all    GAD 7 : Generalized Anxiety Score 06/24/2017 05/11/2017  Nervous, Anxious, on Edge 2 1  Control/stop worrying 2 0  Worry too much - different things 0 0  Trouble relaxing 3 1  Restless 0 0  Easily annoyed or irritable 3 3  Afraid  - awful might happen 0 0  Total GAD 7 Score 10 5  Anxiety Difficulty Somewhat difficult Very difficult        Allergies  Allergen Reactions  . Sulfa Antibiotics Anaphylaxis  . Penicillins Hives  . Sumatriptan      Current Outpatient Medications:  .  aspirin-acetaminophen-caffeine (EXCEDRIN MIGRAINE) 250-250-65 MG tablet, Take by mouth., Disp: , Rfl:  .  Azelastine HCl 0.15 % SOLN, Place into the nose., Disp: , Rfl:  .  baclofen (LIORESAL) 10 MG tablet, Take 10 mg by mouth as needed. , Disp: , Rfl:  .  Cholecalciferol 1000 UNITS tablet, Take by mouth., Disp: , Rfl:  .  escitalopram (LEXAPRO) 20 MG tablet, Take 1 tablet (20 mg total) by mouth daily., Disp: 30 tablet, Rfl: 2 .  FLUARIX QUADRIVALENT 0.5 ML injection, ADM 0.5ML IM UTD, Disp: , Rfl: 0 .  gabapentin (NEURONTIN) 600 MG tablet, Take 600 mg by mouth 2 (two) times daily. , Disp: , Rfl:  .  hydrochlorothiazide (MICROZIDE) 12.5 MG capsule, TK 1 C PO D, Disp: , Rfl: 3 .  levalbuterol (XOPENEX) 1.25 MG/3ML nebulizer solution, Take 1.25 mg by nebulization every 8 (eight) hours as needed for wheezing or shortness of breath., Disp: 72 mL, Rfl: 1 .  omeprazole (PRILOSEC) 40 MG capsule, TAKE 1 CAPSULE BY MOUTH ONCE DAILY, Disp: 90 capsule,  Rfl: 1 .  potassium chloride SA (K-DUR,KLOR-CON) 20 MEQ tablet, Take 2 tablets (40 mEq total) by mouth once for 1 dose., Disp: 60 tablet, Rfl: 3 .  rizatriptan (MAXALT) 10 MG tablet, Take 1 tablet by mouth as needed., Disp: , Rfl: 0 .  topiramate (TOPAMAX) 200 MG tablet, Take 300 mg by mouth daily. , Disp: , Rfl:  .  torsemide (DEMADEX) 20 MG tablet, TAKE 2 TABLETS BY MOUTH ONCE DAILY, Disp: 60 tablet, Rfl: 5 .  VENTOLIN HFA 108 (90 BASE) MCG/ACT inhaler, , Disp: , Rfl: 0  Review of Systems  Constitutional: Positive for fatigue. Negative for appetite change.  HENT: Negative.   Respiratory: Negative.   Cardiovascular: Negative.   Gastrointestinal: Negative.   Neurological: Positive for headaches  (migraines).  Psychiatric/Behavioral: Positive for decreased concentration and depression. Negative for suicidal ideas. The patient does not have insomnia.     Social History   Tobacco Use  . Smoking status: Former Smoker    Packs/day: 1.00    Years: 15.00    Pack years: 15.00    Last attempt to quit: 06/21/1999    Years since quitting: 18.0  . Smokeless tobacco: Never Used  Substance Use Topics  . Alcohol use: Yes    Alcohol/week: 0.0 oz    Comment: occasional   Objective:   BP 100/70 (BP Location: Left Arm, Patient Position: Sitting, Cuff Size: Large)   Pulse (!) 59   Temp 98.5 F (36.9 C) (Oral)   Resp 16   Wt 225 lb (102.1 kg)   LMP  (LMP Unknown)   SpO2 96%   BMI 38.62 kg/m  Vitals:   06/24/17 0809  BP: 100/70  Pulse: (!) 59  Resp: 16  Temp: 98.5 F (36.9 C)  TempSrc: Oral  SpO2: 96%  Weight: 225 lb (102.1 kg)     Physical Exam  Constitutional: She is oriented to person, place, and time. She appears well-developed and well-nourished. She is irritable. No distress.  HENT:  Head: Normocephalic and atraumatic.  Eyes: Conjunctivae are normal. No scleral icterus.  Cardiovascular: Normal rate, regular rhythm, normal heart sounds and intact distal pulses.  No murmur heard. Pulmonary/Chest: Effort normal and breath sounds normal. No respiratory distress. She has no wheezes. She has no rales.  Musculoskeletal: She exhibits no edema.  Neurological: She is alert and oriented to person, place, and time.  Skin: Skin is warm and dry. No rash noted.  Psychiatric:  Intermittently upset when discussing circumstances at home  Vitals reviewed.       Assessment & Plan:      Problem List Items Addressed This Visit      Other   Depression, recurrent ([place]) - Primary    Plenty of stress with home life that contributes Uncontrolled and slightly worsened per PHQ9 score Increase lexapro to 20mg  daily Discussed possible side effects Advised on counseling/therapy No  SI/HI      Relevant Medications   escitalopram (LEXAPRO) 20 MG tablet   Fatigue    Chronic and multifactorial problem Recent labs wnl Using CPAP as presribed Most likely related to depression and stress Will refer to sleep medicine for further eval      Relevant Orders   Ambulatory referral to Pulmonology   Apnea    Continue CPAP Patient reports compliance with CPAP Referral to sleep medicine as above as patient continues to have nonrestorative sleep      Relevant Orders   Ambulatory referral to Pulmonology   Hypokalemia  2/2 loop diuretic Continue KDur Recheck K      Relevant Orders   Basic metabolic panel      Return in about 3 months (around 09/22/2017).      The entirety of the information documented in the History of Present Illness, Review of Systems and Physical Exam were personally obtained by me. Portions of this information were initially documented by Raquel Sarna Ratchford, CMA and reviewed by me for thoroughness and accuracy.    Virginia Crews, MD, MPH [facility] 06/24/2017 3:46 PM

## 2017-06-24 NOTE — Assessment & Plan Note (Signed)
2/2 loop diuretic Continue KDur Recheck K

## 2017-06-24 NOTE — Patient Instructions (Signed)

## 2017-06-24 NOTE — Assessment & Plan Note (Signed)
Plenty of stress with home life that contributes Uncontrolled and slightly worsened per PHQ9 score Increase lexapro to 20mg  daily Discussed possible side effects Advised on counseling/therapy No SI/HI

## 2017-06-25 LAB — BASIC METABOLIC PANEL
BUN / CREAT RATIO: 23 (ref 9–23)
BUN: 23 mg/dL (ref 6–24)
CALCIUM: 9.4 mg/dL (ref 8.7–10.2)
CO2: 21 mmol/L (ref 20–29)
Chloride: 101 mmol/L (ref 96–106)
Creatinine, Ser: 0.99 mg/dL (ref 0.57–1.00)
GFR calc Af Amer: 73 mL/min/{1.73_m2} (ref >59)
GFR, EST NON AFRICAN AMERICAN: 63 mL/min/{1.73_m2} (ref >59)
Glucose: 105 mg/dL — ABNORMAL HIGH (ref 65–99)
POTASSIUM: 3.6 mmol/L (ref 3.5–5.2)
Sodium: 143 mmol/L (ref 134–144)

## 2017-06-27 ENCOUNTER — Telehealth: Payer: Self-pay

## 2017-06-27 NOTE — Telephone Encounter (Signed)
-----   Message from Virginia Crews, MD sent at 06/25/2017  9:40 AM EST ----- Potassium is normal.  Continue current potassium dose.  Kidney function stable  Virginia Crews, MD, MPH [facility] 06/25/2017 9:40 AM

## 2017-06-27 NOTE — Telephone Encounter (Signed)
Pt advised.   Thanks,   -Tea Collums  

## 2017-06-29 ENCOUNTER — Telehealth: Payer: Self-pay | Admitting: *Deleted

## 2017-06-29 ENCOUNTER — Ambulatory Visit: Payer: BLUE CROSS/BLUE SHIELD | Admitting: Podiatry

## 2017-06-29 ENCOUNTER — Encounter: Payer: Self-pay | Admitting: Podiatry

## 2017-06-29 DIAGNOSIS — M7752 Other enthesopathy of left foot: Secondary | ICD-10-CM | POA: Diagnosis not present

## 2017-06-29 NOTE — Progress Notes (Signed)
She presents today chief complaint of pain to the dorsal lateral aspect of her left foot states that her heels are doing pretty well at this point.  Objective: Vital signs are stable alert and oriented x3.  Pulses are palpable.  No pain on palpation of bilateral plantar fascia though she does have pain on palpation and range of motion of the subtalar joint and sinus tarsi area of the left foot.  Assessment: Plantar fasciitis resolving subtalar joint capsulitis left.  Plan: After verbal consent and alcohol wipe I injected sinus tarsi today with 20 mg of Kenalog and 5 mg of Marcaine.  She tolerated this well will follow up with her in 6 weeks if necessary.

## 2017-06-29 NOTE — Telephone Encounter (Signed)
Patient called in a panic stating she got an injection today and now can't walk on foot.  Returned call-patient states she is in a lot of pain since she's had the injection. I advised she take Ibuprofen, ice, rest and wrap with ace if she had one. Also, pt has a cam boot from a previous foot surgery and advised her to start wearing that until symptoms improved

## 2017-07-20 ENCOUNTER — Other Ambulatory Visit: Payer: Self-pay | Admitting: Physician Assistant

## 2017-07-20 DIAGNOSIS — R609 Edema, unspecified: Secondary | ICD-10-CM

## 2017-07-21 NOTE — Telephone Encounter (Signed)
LOV 06/24/2017. No medication changes were made at that time.

## 2017-08-03 ENCOUNTER — Telehealth: Payer: Self-pay | Admitting: Family Medicine

## 2017-08-03 NOTE — Telephone Encounter (Signed)
In med list; was added as historical. This has not been refilled since 2015. Please review.

## 2017-08-03 NOTE — Telephone Encounter (Signed)
Patient is requesting a refill on the following medication  VENTOLIN HFA 108 (90 BASE) MCG/ACT inhaler  She states that she has a cold that has settled in her chest and she is starting to have a little bit of wheezing and usually uses this inhaler for that.  I advised her that she would need a appointment for this and she said you could look back at her record.  She uses [facility] in [place].

## 2017-08-03 NOTE — Telephone Encounter (Signed)
OK to refill with 3 refills. 1-2 puffs q6h prn wheezing/SOB  Virginia Crews, MD, MPH [facility] 08/03/2017 5:01 PM

## 2017-08-04 MED ORDER — ALBUTEROL SULFATE HFA 108 (90 BASE) MCG/ACT IN AERS: 1.0000 | INHALATION_SPRAY | Freq: Four times a day (QID) | RESPIRATORY_TRACT | 3 refills | Status: DC | PRN

## 2017-08-04 NOTE — Telephone Encounter (Signed)
Rx sent and pt advised.

## 2017-08-08 ENCOUNTER — Encounter: Payer: Self-pay | Admitting: Family Medicine

## 2017-08-08 ENCOUNTER — Ambulatory Visit: Payer: BLUE CROSS/BLUE SHIELD | Admitting: Family Medicine

## 2017-08-08 VITALS — BP 130/70 | HR 68 | Temp 98.5°F | Wt 227.0 lb

## 2017-08-08 DIAGNOSIS — J45901 Unspecified asthma with (acute) exacerbation: Secondary | ICD-10-CM

## 2017-08-08 MED ORDER — PREDNISONE 50 MG PO TABS: 50.0000 mg | ORAL_TABLET | Freq: Every day | ORAL | 0 refills | Status: AC

## 2017-08-08 NOTE — Patient Instructions (Signed)

## 2017-08-08 NOTE — Progress Notes (Signed)
Patient: Rachael Brewer Female    DOB: 1960-10-16   57 y.o.   MRN: [id number] Visit Date: 08/08/2017  Today's Provider: Lavon Paganini, MD   Chief Complaint  Patient presents with  . URI   Subjective:    URI   This is a new problem. The current episode started 1 to 4 weeks ago (2 weeks). The problem has been gradually worsening. There has been no fever. Associated symptoms include congestion, coughing, headaches, a plugged ear sensation, sinus pain and wheezing. Pertinent negatives include no chest pain, diarrhea, ear pain, rhinorrhea, sneezing, sore throat or vomiting. Associated symptoms comments: Pt reports her symptoms started with sore throat. Her throat is much better but the chest congestion is worsening.. She has tried decongestant, increased fluids and inhaler use (Mucinex and ventolin) for the symptoms. The treatment provided no relief.       Allergies  Allergen Reactions  . Sulfa Antibiotics Anaphylaxis  . Penicillins Hives  . Sumatriptan      Current Outpatient Medications:  .  albuterol (VENTOLIN HFA) 108 (90 Base) MCG/ACT inhaler, Inhale 1-2 puffs into the lungs every 6 (six) hours as needed for wheezing or shortness of breath., Disp: 1 Inhaler, Rfl: 3 .  aspirin-acetaminophen-caffeine (EXCEDRIN MIGRAINE) 250-250-65 MG tablet, Take by mouth., Disp: , Rfl:  .  Azelastine HCl 0.15 % SOLN, Place into the nose., Disp: , Rfl:  .  baclofen (LIORESAL) 10 MG tablet, Take 10 mg by mouth as needed. , Disp: , Rfl:  .  Cholecalciferol 1000 UNITS tablet, Take by mouth., Disp: , Rfl:  .  escitalopram (LEXAPRO) 20 MG tablet, Take 1 tablet (20 mg total) by mouth daily., Disp: 30 tablet, Rfl: 2 .  gabapentin (NEURONTIN) 600 MG tablet, Take 600 mg by mouth 2 (two) times daily. , Disp: , Rfl:  .  hydrochlorothiazide (MICROZIDE) 12.5 MG capsule, TK 1 C PO D, Disp: , Rfl: 3 .  omeprazole (PRILOSEC) 40 MG capsule, TAKE 1 CAPSULE BY MOUTH ONCE DAILY, Disp: 90 capsule, Rfl:  1 .  potassium chloride SA (K-DUR,KLOR-CON) 20 MEQ tablet, Take 2 tablets (40 mEq total) by mouth once for 1 dose., Disp: 60 tablet, Rfl: 3 .  rizatriptan (MAXALT) 10 MG tablet, Take 1 tablet by mouth as needed., Disp: , Rfl: 0 .  topiramate (TOPAMAX) 200 MG tablet, Take 300 mg by mouth daily. , Disp: , Rfl:  .  torsemide (DEMADEX) 20 MG tablet, TAKE 2 TABLETS BY MOUTH EVERY DAY, Disp: 60 tablet, Rfl: 5 .  FLUARIX QUADRIVALENT 0.5 ML injection, ADM 0.5ML IM UTD, Disp: , Rfl: 0 .  levalbuterol (XOPENEX) 1.25 MG/3ML nebulizer solution, Take 1.25 mg by nebulization every 8 (eight) hours as needed for wheezing or shortness of breath. (Patient not taking: Reported on 08/08/2017), Disp: 72 mL, Rfl: 1  Review of Systems  Constitutional: Positive for appetite change and fatigue. Negative for fever (She did have a fever 5-6 days ago).  HENT: Positive for congestion, postnasal drip, sinus pressure and sinus pain. Negative for ear pain, rhinorrhea, sneezing and sore throat.   Respiratory: Positive for cough, chest tightness, shortness of breath and wheezing.   Cardiovascular: Negative for chest pain, palpitations and leg swelling.  Gastrointestinal: Negative for diarrhea and vomiting.  Neurological: Positive for headaches.    Social History   Tobacco Use  . Smoking status: Former Smoker    Packs/day: 1.00    Years: 15.00    Pack years: 15.00  Last attempt to quit: 06/21/1999    Years since quitting: 18.1  . Smokeless tobacco: Never Used  Substance Use Topics  . Alcohol use: Yes    Alcohol/week: 0.0 oz    Comment: occasional   Objective:   BP 130/70 (BP Location: Left Arm, Patient Position: Sitting, Cuff Size: Normal)   Pulse 68   Temp 98.5 F (36.9 C) (Oral)   Wt 227 lb (103 kg)   LMP  (LMP Unknown)   SpO2 97%   BMI 38.96 kg/m  Vitals:   08/08/17 1504  BP: 130/70  Pulse: 68  Temp: 98.5 F (36.9 C)  TempSrc: Oral  SpO2: 97%  Weight: 227 lb (103 kg)     Physical Exam   Constitutional: She is oriented to person, place, and time. She appears well-developed and well-nourished. No distress.  HENT:  Head: Normocephalic and atraumatic.  Right Ear: External ear normal.  Left Ear: External ear normal.  Nose: Nose normal.  Mouth/Throat: Oropharynx is clear and moist. No oropharyngeal exudate.  Eyes: Conjunctivae are normal. Pupils are equal, round, and reactive to light. No scleral icterus.  Neck: Neck supple. No thyromegaly present.  Cardiovascular: Normal rate, regular rhythm, normal heart sounds and intact distal pulses.  No murmur heard. Pulmonary/Chest: Effort normal. No respiratory distress. She has wheezes.  Musculoskeletal: She exhibits no edema or deformity.  Lymphadenopathy:    She has no cervical adenopathy.  Neurological: She is alert and oriented to person, place, and time.  Skin: Skin is warm and dry. No rash noted.  Psychiatric: She has a normal mood and affect. Her behavior is normal.  Vitals reviewed.       Assessment & Plan:     1. Asthmatic bronchitis with acute exacerbation, unspecified asthma severity, unspecified whether persistent - no evidence of bacterial infection - does have wheezing c/w bronchitis - has h/o asthmatic bronchitis after URI - will treat with 5d prednisone burst -discussed symptomatic management and return precautions   Meds ordered this encounter  Medications  . predniSONE (DELTASONE) 50 MG tablet    Sig: Take 1 tablet (50 mg total) by mouth daily with breakfast for 5 days.    Dispense:  5 tablet    Refill:  0     Return if symptoms worsen or fail to improve.   The entirety of the information documented in the History of Present Illness, Review of Systems and Physical Exam were personally obtained by me. Portions of this information were initially documented by Lyndel Pleasure, CMA and reviewed by me for thoroughness and accuracy.    Virginia Crews, MD, MPH [facility] 08/08/2017 3:33 PM

## 2017-08-10 ENCOUNTER — Encounter: Payer: Self-pay | Admitting: Podiatry

## 2017-08-10 ENCOUNTER — Ambulatory Visit: Payer: BLUE CROSS/BLUE SHIELD | Admitting: Podiatry

## 2017-08-10 DIAGNOSIS — M7752 Other enthesopathy of left foot: Secondary | ICD-10-CM

## 2017-08-10 NOTE — Progress Notes (Signed)
She presents today for follow-up of her subtalar joint capsulitis states that is still sore but is doing better than it was.  Objective: Vital signs are stable alert and oriented x3 pulses are palpable.  She still has a palpable palpable click with range of motion of the ankle joint.  Majority of her pain however is located in the sinus tarsi area and on end range of motion of the subtalar joint left.  Assessment: Subtalar joint capsulitis left ankle joint capsulitis left.  Plan: After sterile Betadine skin prep I injected 20 mg Kenalog 5 mg Marcaine point maximal tenderness of the l left subtalar joint.  She tolerated procedure well follow-up with me on an as-needed basis.

## 2017-08-22 ENCOUNTER — Other Ambulatory Visit: Payer: Self-pay | Admitting: Physician Assistant

## 2017-08-22 DIAGNOSIS — G4733 Obstructive sleep apnea (adult) (pediatric): Secondary | ICD-10-CM | POA: Diagnosis not present

## 2017-08-22 DIAGNOSIS — K219 Gastro-esophageal reflux disease without esophagitis: Secondary | ICD-10-CM

## 2017-09-13 ENCOUNTER — Encounter: Payer: Self-pay | Admitting: Family Medicine

## 2017-09-15 MED ORDER — POTASSIUM CHLORIDE 40 MEQ/15ML (20%) PO SOLN: 20.0000 meq | Freq: Every day | ORAL | 3 refills | Status: DC

## 2017-09-15 MED ORDER — POTASSIUM CHLORIDE 40 MEQ/15ML (20%) PO SOLN: 40.0000 meq | Freq: Every day | ORAL | 3 refills | Status: DC

## 2017-09-21 ENCOUNTER — Ambulatory Visit: Payer: BLUE CROSS/BLUE SHIELD | Admitting: Podiatry

## 2017-09-22 ENCOUNTER — Encounter: Payer: Self-pay | Admitting: Family Medicine

## 2017-09-22 ENCOUNTER — Ambulatory Visit: Payer: BLUE CROSS/BLUE SHIELD | Admitting: Family Medicine

## 2017-09-22 VITALS — BP 108/62 | HR 56 | Temp 97.9°F | Resp 16 | Wt 226.0 lb

## 2017-09-22 DIAGNOSIS — Z6838 Body mass index (BMI) 38.0-38.9, adult: Secondary | ICD-10-CM

## 2017-09-22 DIAGNOSIS — E669 Obesity, unspecified: Secondary | ICD-10-CM

## 2017-09-22 DIAGNOSIS — F339 Major depressive disorder, recurrent, unspecified: Secondary | ICD-10-CM | POA: Diagnosis not present

## 2017-09-22 DIAGNOSIS — K644 Residual hemorrhoidal skin tags: Secondary | ICD-10-CM

## 2017-09-22 NOTE — Assessment & Plan Note (Signed)
Discussed that this can contribute to hemorrhoids

## 2017-09-22 NOTE — Progress Notes (Signed)
Patient: Rachael Brewer Female    DOB: June 24, 1960   57 y.o.   MRN: [id number] Visit Date: 09/22/2017  Today's Provider: Lavon Paganini, MD   I, Martha Clan, CMA, am acting as scribe for Lavon Paganini, MD.  Chief Complaint  Patient presents with  . Depression  . Rectal Pain   Subjective:    Depression         Chronicity: FU from 06/24/2017. Lexapro was increased to 20 mg at that time.  The problem has been gradually improving since onset.  Associated symptoms include irritable and headaches (migraines).  Associated symptoms include no decreased concentration, no fatigue (improved), no helplessness, no hopelessness, does not have insomnia, no restlessness, no decreased interest, no appetite change, no indigestion, not sad and no suicidal ideas.  Past treatments include SSRIs - Selective serotonin reuptake inhibitors.  Compliance with treatment is good.  Previous treatment provided moderate relief.  Pt is also c/o rectal pain x 1 week, and is slightly improving. She denies bleeding, fever, redness, drainage, constipation, diarrhea. Pain is exacerbated by certain positions.   Depression screen [facility] 2/9 09/22/2017 06/24/2017 05/11/2017 12/15/2015  Decreased Interest 1 1 3 3   Down, Depressed, Hopeless 1 1 3  0  PHQ - 2 Score 2 2 6 3   Altered sleeping 0 2 0 0  Tired, decreased energy 1 3 3 3   Change in appetite 0 3 0 3  Feeling bad or failure about yourself  3 3 1 3   Trouble concentrating 0 1 0 0  Moving slowly or fidgety/restless 0 0 1 0  Suicidal thoughts 0 0 0 0  PHQ-9 Score 6 14 11 12   Difficult doing work/chores Somewhat difficult Very difficult Somewhat difficult Not difficult at all    GAD 7 : Generalized Anxiety Score 09/22/2017 06/24/2017 05/11/2017  Nervous, Anxious, on Edge 0 2 1  Control/stop worrying 0 2 0  Worry too much - different things 1 0 0  Trouble relaxing 0 3 1  Restless 0 0 0  Easily annoyed or irritable 1 3 3   Afraid - awful might happen 0 0 0    Total GAD 7 Score 2 10 5   Anxiety Difficulty Somewhat difficult Somewhat difficult Very difficult       Allergies  Allergen Reactions  . Sulfa Antibiotics Anaphylaxis  . Penicillins Hives  . Sumatriptan      Current Outpatient Medications:  .  albuterol (VENTOLIN HFA) 108 (90 Base) MCG/ACT inhaler, Inhale 1-2 puffs into the lungs every 6 (six) hours as needed for wheezing or shortness of breath., Disp: 1 Inhaler, Rfl: 3 .  aspirin-acetaminophen-caffeine (EXCEDRIN MIGRAINE) 250-250-65 MG tablet, Take by mouth., Disp: , Rfl:  .  Azelastine HCl 0.15 % SOLN, Place into the nose., Disp: , Rfl:  .  baclofen (LIORESAL) 10 MG tablet, Take 10 mg by mouth as needed. , Disp: , Rfl:  .  Cholecalciferol 1000 UNITS tablet, Take by mouth., Disp: , Rfl:  .  escitalopram (LEXAPRO) 20 MG tablet, Take 1 tablet (20 mg total) by mouth daily., Disp: 30 tablet, Rfl: 2 .  gabapentin (NEURONTIN) 600 MG tablet, Take 600 mg by mouth 2 (two) times daily. , Disp: , Rfl:  .  hydrochlorothiazide (MICROZIDE) 12.5 MG capsule, TK 1 C PO D, Disp: , Rfl: 3 .  omeprazole (PRILOSEC) 40 MG capsule, TAKE 1 CAPSULE BY MOUTH EVERY DAY, Disp: 90 capsule, Rfl: 2 .  Potassium Chloride 40 MEQ/15ML (20%) SOLN, Take 40 mEq by  mouth daily., Disp: 473 mL, Rfl: 3 .  rizatriptan (MAXALT) 10 MG tablet, Take 1 tablet by mouth as needed., Disp: , Rfl: 0 .  topiramate (TOPAMAX) 200 MG tablet, Take 300 mg by mouth daily. , Disp: , Rfl:  .  torsemide (DEMADEX) 20 MG tablet, TAKE 2 TABLETS BY MOUTH EVERY DAY, Disp: 60 tablet, Rfl: 5  Review of Systems  Constitutional: Negative for appetite change and fatigue (improved).  Gastrointestinal: Positive for rectal pain. Negative for abdominal distention, abdominal pain, anal bleeding, blood in stool, constipation and diarrhea.  Neurological: Positive for headaches (migraines).  Psychiatric/Behavioral: Positive for depression. Negative for decreased concentration and suicidal ideas. The patient  does not have insomnia.     Social History   Tobacco Use  . Smoking status: Former Smoker    Packs/day: 1.00    Years: 15.00    Pack years: 15.00    Last attempt to quit: 06/21/1999    Years since quitting: 18.2  . Smokeless tobacco: Never Used  Substance Use Topics  . Alcohol use: Yes    Alcohol/week: 0.0 oz    Comment: occasional   Objective:   BP 108/62 (BP Location: Left Arm, Patient Position: Sitting, Cuff Size: Large)   Pulse (!) 56   Temp 97.9 F (36.6 C) (Oral)   Resp 16   Wt 226 lb (102.5 kg)   LMP  (LMP Unknown)   SpO2 97%   BMI 38.79 kg/m  Vitals:   09/22/17 0805  BP: 108/62  Pulse: (!) 56  Resp: 16  Temp: 97.9 F (36.6 C)  TempSrc: Oral  SpO2: 97%  Weight: 226 lb (102.5 kg)     Physical Exam  Constitutional: She is oriented to person, place, and time. She appears well-developed and well-nourished. She is irritable. No distress.  HENT:  Head: Normocephalic and atraumatic.  Eyes: Conjunctivae are normal. No scleral icterus.  Neck: Neck supple. No thyromegaly present.  Cardiovascular: Normal rate, regular rhythm, normal heart sounds and intact distal pulses.  No murmur heard. Pulmonary/Chest: Effort normal and breath sounds normal. No respiratory distress. She has no wheezes. She has no rales.  Abdominal: Soft. She exhibits no distension. There is no tenderness.  Genitourinary: Rectal exam shows external hemorrhoid and tenderness. Rectal exam shows no internal hemorrhoid, no fissure, no mass and anal tone normal.  Musculoskeletal: She exhibits no edema.  Lymphadenopathy:    She has no cervical adenopathy.  Neurological: She is alert and oriented to person, place, and time.  Skin: Skin is warm and dry. No rash noted.  Psychiatric: She has a normal mood and affect. Her behavior is normal.  Vitals reviewed.       Assessment & Plan:   Problem List Items Addressed This Visit      Cardiovascular and Mediastinum   External hemorrhoid     Discussed symptomatic management including sitz baths, tucks pads, avoidance of constipation No evidence of thrombosis or abscess        Other   Depression, recurrent ([place]) - Primary    Much improved on higher dose of Lexapro Doing well now Continue current dose      Obesity    Discussed that this can contribute to hemorrhoids          Return in about 7 months (around 04/24/2018) for physical (after 11/21).   The entirety of the information documented in the History of Present Illness, Review of Systems and Physical Exam were personally obtained by me. Portions of this information  were initially documented by Martha Clan, CMA and reviewed by me for thoroughness and accuracy.    Virginia Crews, MD, MPH [facility] 09/22/2017 8:51 AM

## 2017-09-22 NOTE — Patient Instructions (Signed)
Hemorrhoids Hemorrhoids are swollen veins in and around the rectum or anus. There are two types of hemorrhoids:  Internal hemorrhoids. These occur in the veins that are just inside the rectum. They may poke through to the outside and become irritated and painful.  External hemorrhoids. These occur in the veins that are outside of the anus and can be felt as a painful swelling or hard lump near the anus.  Most hemorrhoids do not cause serious problems, and they can be managed with home treatments such as diet and lifestyle changes. If home treatments do not help your symptoms, procedures can be done to shrink or remove the hemorrhoids. What are the causes? This condition is caused by increased pressure in the anal area. This pressure may result from various things, including:  Constipation.  Straining to have a bowel movement.  Diarrhea.  Pregnancy.  Obesity.  Sitting for long periods of time.  Heavy lifting or other activity that causes you to strain.  Anal sex.  What are the signs or symptoms? Symptoms of this condition include:  Pain.  Anal itching or irritation.  Rectal bleeding.  Leakage of stool (feces).  Anal swelling.  One or more lumps around the anus.  How is this diagnosed? This condition can often be diagnosed through a visual exam. Other exams or tests may also be done, such as:  Examination of the rectal area with a gloved hand (digital rectal exam).  Examination of the anal canal using a small tube (anoscope).  A blood test, if you have lost a significant amount of blood.  A test to look inside the colon (sigmoidoscopy or colonoscopy).  How is this treated? This condition can usually be treated at home. However, various procedures may be done if dietary changes, lifestyle changes, and other home treatments do not help your symptoms. These procedures can help make the hemorrhoids smaller or remove them completely. Some of these procedures involve  surgery, and others do not. Common procedures include:  Rubber band ligation. Rubber bands are placed at the base of the hemorrhoids to cut off the blood supply to them.  Sclerotherapy. Medicine is injected into the hemorrhoids to shrink them.  Infrared coagulation. A type of light energy is used to get rid of the hemorrhoids.  Hemorrhoidectomy surgery. The hemorrhoids are surgically removed, and the veins that supply them are tied off.  Stapled hemorrhoidopexy surgery. A circular stapling device is used to remove the hemorrhoids and use staples to cut off the blood supply to them.  Follow these instructions at home: Eating and drinking  Eat foods that have a lot of fiber in them, such as whole grains, beans, nuts, fruits, and vegetables. Ask your health care provider about taking products that have added fiber (fiber supplements).  Drink enough fluid to keep your urine clear or pale yellow. Managing pain and swelling  Take warm sitz baths for 20 minutes, 3-4 times a day to ease pain and discomfort.  If directed, apply ice to the affected area. Using ice packs between sitz baths may be helpful. ? Put ice in a plastic bag. ? Place a towel between your skin and the bag. ? Leave the ice on for 20 minutes, 2-3 times a day. General instructions  Take over-the-counter and prescription medicines only as told by your health care provider.  Use medicated creams or suppositories as told.  Exercise regularly.  Go to the bathroom when you have the urge to have a bowel movement. Do not wait.    Avoid straining to have bowel movements.  Keep the anal area dry and clean. Use wet toilet paper or moist towelettes after a bowel movement.  Do not sit on the toilet for long periods of time. This increases blood pooling and pain. Contact a health care provider if:  You have increasing pain and swelling that are not controlled by treatment or medicine.  You have uncontrolled bleeding.  You  have difficulty having a bowel movement, or you are unable to have a bowel movement.  You have pain or inflammation outside the area of the hemorrhoids. This information is not intended to replace advice given to you by your health care provider. Make sure you discuss any questions you have with your health care provider. Document Released: 06/04/2000 Document Revised: 11/05/2015 Document Reviewed: 02/19/2015 Elsevier Interactive Patient Education  2018 Elsevier Inc.  

## 2017-09-22 NOTE — Assessment & Plan Note (Signed)
Discussed symptomatic management including sitz baths, tucks pads, avoidance of constipation No evidence of thrombosis or abscess

## 2017-09-22 NOTE — Assessment & Plan Note (Signed)
Much improved on higher dose of Lexapro Doing well now Continue current dose

## 2017-09-23 ENCOUNTER — Encounter: Payer: Self-pay | Admitting: Family Medicine

## 2017-09-27 ENCOUNTER — Telehealth: Payer: Self-pay | Admitting: Family Medicine

## 2017-09-27 NOTE — Telephone Encounter (Signed)
Received 2 pages 04/28/17 sent to [facility]

## 2017-09-27 NOTE — Telephone Encounter (Signed)
Faxed [facility] to [employer]  on 11.8.18

## 2017-09-28 ENCOUNTER — Ambulatory Visit: Payer: BLUE CROSS/BLUE SHIELD | Admitting: Podiatry

## 2017-09-28 DIAGNOSIS — G4733 Obstructive sleep apnea (adult) (pediatric): Secondary | ICD-10-CM | POA: Diagnosis not present

## 2017-10-08 ENCOUNTER — Encounter: Payer: Self-pay | Admitting: Podiatry

## 2017-10-31 DIAGNOSIS — H8109 Meniere's disease, unspecified ear: Secondary | ICD-10-CM | POA: Diagnosis not present

## 2017-10-31 DIAGNOSIS — H903 Sensorineural hearing loss, bilateral: Secondary | ICD-10-CM | POA: Diagnosis not present

## 2017-11-04 ENCOUNTER — Telehealth: Payer: Self-pay

## 2017-11-04 ENCOUNTER — Other Ambulatory Visit: Payer: Self-pay | Admitting: Family Medicine

## 2017-11-04 ENCOUNTER — Ambulatory Visit
Admission: RE | Admit: 2017-11-04 | Discharge: 2017-11-04 | Disposition: A | Discharge status: Home / Self Care | Payer: BLUE CROSS/BLUE SHIELD | Source: Ambulatory Visit | Attending: Family Medicine | Admitting: Family Medicine

## 2017-11-04 DIAGNOSIS — Z1239 Encounter for other screening for malignant neoplasm of breast: Secondary | ICD-10-CM

## 2017-11-04 DIAGNOSIS — Z1231 Encounter for screening mammogram for malignant neoplasm of breast: Secondary | ICD-10-CM | POA: Insufficient documentation

## 2017-11-04 NOTE — Telephone Encounter (Signed)
Pt advised.

## 2017-11-04 NOTE — Telephone Encounter (Signed)
-----   Message from Virginia Crews, MD sent at 11/04/2017  2:20 PM EDT ----- Normal mammogram.  Repeat in 1 year.  Virginia Crews, MD, MPH [facility] 11/04/2017 2:20 PM

## 2017-11-07 ENCOUNTER — Other Ambulatory Visit: Payer: Self-pay | Admitting: Family Medicine

## 2017-11-09 ENCOUNTER — Encounter: Payer: Self-pay | Admitting: Podiatry

## 2017-11-09 ENCOUNTER — Ambulatory Visit: Payer: BLUE CROSS/BLUE SHIELD | Admitting: Podiatry

## 2017-11-09 DIAGNOSIS — M722 Plantar fascial fibromatosis: Secondary | ICD-10-CM | POA: Diagnosis not present

## 2017-11-09 NOTE — Progress Notes (Signed)
She presents today for follow-up of her subtalar joint capsulitis states that it still hurts some but is doing much better than it was.  Objective: Vital signs are stable she is alert and oriented x3 she is has pain on palpation medial continue tubercle of the left heel.  Mild tenderness on palpation of the posterior tibial tendon and much decrease in pain overlying the sinus tarsi.  Assessment: Plantar fasciitis right.  Posterior tibial tendinitis.  Plan: After sterile Betadine skin prep I injected 20 mg Kenalog 5 mg Marcaine to the point of maximal tenderness of the left heel.  She tolerated procedure well will follow-up with me in 1 month

## 2017-11-16 DIAGNOSIS — G4733 Obstructive sleep apnea (adult) (pediatric): Secondary | ICD-10-CM | POA: Diagnosis not present

## 2017-11-23 DIAGNOSIS — G43839 Menstrual migraine, intractable, without status migrainosus: Secondary | ICD-10-CM | POA: Diagnosis not present

## 2017-11-23 DIAGNOSIS — G43719 Chronic migraine without aura, intractable, without status migrainosus: Secondary | ICD-10-CM | POA: Diagnosis not present

## 2017-11-23 DIAGNOSIS — G43019 Migraine without aura, intractable, without status migrainosus: Secondary | ICD-10-CM | POA: Diagnosis not present

## 2018-01-09 ENCOUNTER — Ambulatory Visit: Payer: BLUE CROSS/BLUE SHIELD | Admitting: Podiatry

## 2018-01-09 ENCOUNTER — Encounter: Payer: Self-pay | Admitting: Podiatry

## 2018-01-09 DIAGNOSIS — M76822 Posterior tibial tendinitis, left leg: Secondary | ICD-10-CM

## 2018-01-09 DIAGNOSIS — M722 Plantar fascial fibromatosis: Secondary | ICD-10-CM

## 2018-01-09 NOTE — Progress Notes (Signed)
She presents today for follow-up of plantar fasciitis and posterior tibial tendinitis left foot.  States that the left still hurts but the right is doing much better.  Objective: Vital signs are stable she is alert and oriented x3.  She has pinpoint tenderness on the inferomedial malleolar area of the posterior tibial tendon left no pain on palpation of the plantar fascia either foot.  Assessment: Some residual ulcer tibial tendinitis left.  Plan: I will follow-up with her in a couple of months however I did inject her today with 4 mg of dexamethasone and local anesthetic after sterile Betadine skin prep.  Tolerated procedure well without complications follow-up with her in a couple of months.

## 2018-01-12 DIAGNOSIS — G4733 Obstructive sleep apnea (adult) (pediatric): Secondary | ICD-10-CM | POA: Diagnosis not present

## 2018-01-24 ENCOUNTER — Other Ambulatory Visit: Payer: Self-pay | Admitting: Family Medicine

## 2018-01-24 DIAGNOSIS — F3342 Major depressive disorder, recurrent, in full remission: Secondary | ICD-10-CM

## 2018-02-06 ENCOUNTER — Ambulatory Visit: Payer: Self-pay | Admitting: Family Medicine

## 2018-02-09 ENCOUNTER — Ambulatory Visit: Payer: BLUE CROSS/BLUE SHIELD | Admitting: Family Medicine

## 2018-02-09 VITALS — BP 122/70 | HR 64 | Temp 98.3°F | Resp 16 | Wt 231.0 lb

## 2018-02-09 DIAGNOSIS — G43709 Chronic migraine without aura, not intractable, without status migrainosus: Secondary | ICD-10-CM | POA: Diagnosis not present

## 2018-02-09 DIAGNOSIS — R5383 Other fatigue: Secondary | ICD-10-CM | POA: Diagnosis not present

## 2018-02-09 DIAGNOSIS — E876 Hypokalemia: Secondary | ICD-10-CM | POA: Diagnosis not present

## 2018-02-09 DIAGNOSIS — R413 Other amnesia: Secondary | ICD-10-CM

## 2018-02-09 DIAGNOSIS — Z23 Encounter for immunization: Secondary | ICD-10-CM | POA: Diagnosis not present

## 2018-02-09 DIAGNOSIS — E7800 Pure hypercholesterolemia, unspecified: Secondary | ICD-10-CM | POA: Diagnosis not present

## 2018-02-09 DIAGNOSIS — R5382 Chronic fatigue, unspecified: Secondary | ICD-10-CM | POA: Diagnosis not present

## 2018-02-09 NOTE — Patient Instructions (Addendum)
Try moving your Quexy dose to bedtime instead of morning Set a follow-up appointment with Dr Domingo Cocking It is possible that all of your symptoms could be caused by the Topiramate  Fatigue Fatigue is feeling tired all of the time, a lack of energy, or a lack of motivation. Occasional or mild fatigue is often a normal response to activity or life in general. However, long-lasting (chronic) or extreme fatigue may indicate an underlying medical condition. Follow these instructions at home: Watch your fatigue for any changes. The following actions may help to lessen any discomfort you are feeling:  Talk to your health care provider about how much sleep you need each night. Try to get the required amount every night.  Take medicines only as directed by your health care provider.  Eat a healthy and nutritious diet. Ask your health care provider if you need help changing your diet.  Drink enough fluid to keep your urine clear or pale yellow.  Practice ways of relaxing, such as yoga, meditation, massage therapy, or acupuncture.  Exercise regularly.  Change situations that cause you stress. Try to keep your work and personal routine reasonable.  Do not abuse illegal drugs.  Limit alcohol intake to no more than 1 drink per day for nonpregnant women and 2 drinks per day for men. One drink equals 12 ounces of beer, 5 ounces of wine, or 1 ounces of hard liquor.  Take a multivitamin, if directed by your health care provider.  Contact a health care provider if:  Your fatigue does not get better.  You have a fever.  You have unintentional weight loss or gain.  You have headaches.  You have difficulty: ? Falling asleep. ? Sleeping throughout the night.  You feel angry, guilty, anxious, or sad.  You are unable to have a bowel movement (constipation).  You skin is dry.  Your legs or another part of your body is swollen. Get help right away if:  You feel confused.  Your vision is  blurry.  You feel faint or pass out.  You have a severe headache.  You have severe abdominal, pelvic, or back pain.  You have chest pain, shortness of breath, or an irregular or fast heartbeat.  You are unable to urinate or you urinate less than normal.  You develop abnormal bleeding, such as bleeding from the rectum, vagina, nose, lungs, or nipples.  You vomit blood.  You have thoughts about harming yourself or committing suicide.  You are worried that you might harm someone else. This information is not intended to replace advice given to you by your health care provider. Make sure you discuss any questions you have with your health care provider. Document Released: 04/04/2007 Document Revised: 11/13/2015 Document Reviewed: 10/09/2013 Elsevier Interactive Patient Education  [employer].

## 2018-02-09 NOTE — Progress Notes (Signed)
Patient: Rachael Brewer Female    DOB: 02/16/1961   57 y.o.   MRN: [id number] Visit Date: 02/09/2018  Today's Provider: Lavon Paganini, MD   I, Martha Clan, CMA, am acting as scribe for Lavon Paganini, MD.  Chief Complaint  Patient presents with  . Fatigue   Subjective:    HPI    Fatigue: Patient complains of fatigue. Symptoms began about 2 weeks ago. Sentinal symptom the patient feels fatigue began with: symptoms of arthritis and unusual rashes. Symptoms of her fatigue have been feelings of depression and headaches. Patient describes the following psychologic symptoms: none.  Patient denies fever, significant change in weight, exercise intolerance and cold intolerance, constipation and change in hair texture.. Symptoms have gradually worsened. Pt has a H/O sleep apnea, and states she still has some difficulty with her CPAP due to "narrow nasal passages".  Pt is also concerned about memory issues. She states she has noticed memory impairment a few times over the last 2 months. This especially occurs while driving through intersections. She forgets where she is, where she is going, etc. This only seems to occur while driving.  She states she has been playing brain games, learning Spanish, training her dog to try to help her memory.  Of note, her Topamax ws changed to long acting Topiramate about 2 months ago.  She is managed by Dr Domingo Cocking at [facility] in [place] for chronic migraines.  MMSE - Mini Mental State Exam 02/09/2018  Orientation to time 5  Orientation to Place 5  Registration 3  Attention/ Calculation 5  Recall 3  Language- name 2 objects 2  Language- repeat 1  Language- follow 3 step command 3  Language- read & follow direction 1  Write a sentence 1  Copy design 1  Total score 30    Allergies  Allergen Reactions  . Sulfa Antibiotics Anaphylaxis  . Penicillins Hives  . Sumatriptan      Current Outpatient Medications:  .   albuterol (VENTOLIN HFA) 108 (90 Base) MCG/ACT inhaler, Inhale 1-2 puffs into the lungs every 6 (six) hours as needed for wheezing or shortness of breath., Disp: 1 Inhaler, Rfl: 3 .  aspirin-acetaminophen-caffeine (EXCEDRIN MIGRAINE) 250-250-65 MG tablet, Take by mouth., Disp: , Rfl:  .  Azelastine HCl 0.15 % SOLN, Place into the nose., Disp: , Rfl:  .  baclofen (LIORESAL) 10 MG tablet, Take 10 mg by mouth as needed. , Disp: , Rfl:  .  Cholecalciferol 1000 UNITS tablet, Take by mouth., Disp: , Rfl:  .  escitalopram (LEXAPRO) 20 MG tablet, TAKE 1 TABLET(20 MG) BY MOUTH DAILY, Disp: 30 tablet, Rfl: 3 .  escitalopram (LEXAPRO) 20 MG tablet, Take 1 tablet (20 mg total) by mouth daily., Disp: 30 tablet, Rfl: 5 .  gabapentin (NEURONTIN) 600 MG tablet, Take 600 mg by mouth 2 (two) times daily. , Disp: , Rfl:  .  hydrochlorothiazide (MICROZIDE) 12.5 MG capsule, TK 1 C PO D, Disp: , Rfl: 3 .  omeprazole (PRILOSEC) 40 MG capsule, TAKE 1 CAPSULE BY MOUTH EVERY DAY, Disp: 90 capsule, Rfl: 2 .  Potassium Chloride 40 MEQ/15ML (20%) SOLN, Take 40 mEq by mouth daily., Disp: 473 mL, Rfl: 3 .  QUDEXY XR 150 MG CS24 sprinkle capsule, TK 2 CS PO QHS, Disp: , Rfl: 5 .  rizatriptan (MAXALT) 10 MG tablet, Take 1 tablet by mouth as needed., Disp: , Rfl: 0 .  topiramate (TOPAMAX) 200 MG tablet, Take 300  mg by mouth daily. , Disp: , Rfl:  .  torsemide (DEMADEX) 20 MG tablet, TAKE 2 TABLETS BY MOUTH EVERY DAY, Disp: 60 tablet, Rfl: 5  Review of Systems  Constitutional: Positive for fatigue. Negative for activity change, appetite change, chills, diaphoresis, fever and unexpected weight change.  Respiratory: Negative for shortness of breath.   Musculoskeletal: Positive for arthralgias.  Neurological: Positive for headaches.  Psychiatric/Behavioral: Positive for confusion.    Social History   Tobacco Use  . Smoking status: Former Smoker    Packs/day: 1.00    Years: 15.00    Pack years: 15.00    Last attempt to  quit: 06/21/1999    Years since quitting: 18.6  . Smokeless tobacco: Never Used  Substance Use Topics  . Alcohol use: Yes    Alcohol/week: 0.0 standard drinks    Comment: occasional   Objective:   LMP  (LMP Unknown)  There were no vitals filed for this visit.   Physical Exam  Constitutional: She is oriented to person, place, and time. She appears well-developed and well-nourished. No distress.  HENT:  Head: Normocephalic and atraumatic.  Right Ear: External ear normal.  Left Ear: External ear normal.  Nose: Nose normal.  Mouth/Throat: Oropharynx is clear and moist.  Eyes: Conjunctivae are normal. No scleral icterus.  Neck: Neck supple. No thyromegaly present.  Cardiovascular: Normal rate, regular rhythm, normal heart sounds and intact distal pulses.  No murmur heard. Pulmonary/Chest: Effort normal and breath sounds normal. No respiratory distress. She has no wheezes. She has no rales.  Musculoskeletal: She exhibits no edema.  Lymphadenopathy:    She has no cervical adenopathy.  Neurological: She is alert and oriented to person, place, and time. She has normal strength. No cranial nerve deficit or sensory deficit. She exhibits normal muscle tone. She displays a negative Romberg sign. Coordination normal.  Skin: Skin is warm and dry. Capillary refill takes less than 2 seconds. No rash noted.  Psychiatric: She has a normal mood and affect. Her behavior is normal.  Vitals reviewed.      Assessment & Plan:   1. Fatigue, unspecified type 2. Chronic migraine without aura without status migrainosus, not intractable 3. Memory impairment -Patient's new symptoms of memory impairment, fatigue, dizziness, and worsening of migraines seem to coincide with the change in her topiramate -Discussed with patient that these are all known significant side effects of topiramate -Advised her to switch her dosing to bedtime to see if this will at least help with some of the fatigue -Discussed  importance of following up with her neurologist that prescribes this to consider alternative medication or dose titration to alleviate the symptoms -We will check for other possible etiologies including CBC, CMP, TSH, vitamin D, vitamin B12 -I will follow-up in 3 months and discussed return precautions in the meantime - CBC - Comprehensive metabolic panel - TSH - VITAMIN D 25 Hydroxy (Vit-D Deficiency, Fractures) - B12  4. Need for immunization against influenza - Flu Vaccine QUAD 36+ mos IM   Return in about 3 months (around 05/12/2018) for memory and fatigue f/u.   The entirety of the information documented in the History of Present Illness, Review of Systems and Physical Exam were personally obtained by me. Portions of this information were initially documented by Raquel Sarna Ratchford, CMA and reviewed by me for thoroughness and accuracy.    Virginia Crews, MD, MPH [facility] 02/09/2018 4:46 PM

## 2018-02-10 ENCOUNTER — Telehealth: Payer: Self-pay

## 2018-02-10 LAB — COMPREHENSIVE METABOLIC PANEL
ALK PHOS: 45 IU/L (ref 39–117)
ALT: 17 IU/L (ref 0–32)
AST: 23 IU/L (ref 0–40)
Albumin/Globulin Ratio: 1.6 (ref 1.2–2.2)
Albumin: 4.2 g/dL (ref 3.5–5.5)
BUN/Creatinine Ratio: 23 (ref 9–23)
BUN: 22 mg/dL (ref 6–24)
Bilirubin Total: 0.3 mg/dL (ref 0.0–1.2)
CALCIUM: 9.5 mg/dL (ref 8.7–10.2)
CO2: 29 mmol/L (ref 20–29)
CREATININE: 0.94 mg/dL (ref 0.57–1.00)
Chloride: 96 mmol/L (ref 96–106)
GFR calc Af Amer: 77 mL/min/{1.73_m2} (ref >59)
GFR, EST NON AFRICAN AMERICAN: 67 mL/min/{1.73_m2} (ref >59)
GLUCOSE: 98 mg/dL (ref 65–99)
Globulin, Total: 2.7 g/dL (ref 1.5–4.5)
Potassium: 2.7 mmol/L — ABNORMAL LOW (ref 3.5–5.2)
Sodium: 142 mmol/L (ref 134–144)
Total Protein: 6.9 g/dL (ref 6.0–8.5)

## 2018-02-10 LAB — VITAMIN B12: VITAMIN B 12: 1600 pg/mL — AB (ref 232–1245)

## 2018-02-10 LAB — CBC
Hematocrit: 43 % (ref 34.0–46.6)
Hemoglobin: 14.4 g/dL (ref 11.1–15.9)
MCH: 31.4 pg (ref 26.6–33.0)
MCHC: 33.5 g/dL (ref 31.5–35.7)
MCV: 94 fL (ref 79–97)
PLATELETS: 304 10*3/uL (ref 150–450)
RBC: 4.58 x10E6/uL (ref 3.77–5.28)
RDW: 13.5 % (ref 12.3–15.4)
WBC: 8.5 10*3/uL (ref 3.4–10.8)

## 2018-02-10 LAB — VITAMIN D 25 HYDROXY (VIT D DEFICIENCY, FRACTURES): VIT D 25 HYDROXY: 34.2 ng/mL (ref 30.0–100.0)

## 2018-02-10 LAB — TSH: TSH: 1.27 u[IU]/mL (ref 0.450–4.500)

## 2018-02-10 MED ORDER — POTASSIUM CHLORIDE 40 MEQ/15ML (20%) PO SOLN: 40.0000 meq | Freq: Every day | ORAL | 3 refills | Status: DC

## 2018-02-10 NOTE — Telephone Encounter (Signed)
Pt advised. States she is taking on 5 mL of potassium. She also states her prescription states to take 7.5 mL daily. Our records state she is supposed to take 15 mL daily. She is requesting new rx with correct sig sent to [facility] in [place].

## 2018-02-10 NOTE — Telephone Encounter (Signed)
-----   Message from Virginia Crews, MD sent at 02/10/2018  8:23 AM EDT ----- Normal Blood counts, kidney function, liver function, electrolytes, Thyroid function, Vit D.  B12 levels are actually high.  No need for additional supplementation.  Can cut back to half the dose of supplement that you are currently doing.  Potassium is low.  Is patient taking K supplement regularly?  Virginia Crews, MD, MPH [facility] 02/10/2018 8:23 AM

## 2018-02-10 NOTE — Telephone Encounter (Signed)
New Rx sent to pharmacy with correct odse.  We will recheck at next appt.  Virginia Crews, MD, MPH [facility] 02/10/2018 9:41 AM

## 2018-02-22 ENCOUNTER — Ambulatory Visit: Payer: BLUE CROSS/BLUE SHIELD | Admitting: Podiatry

## 2018-02-22 ENCOUNTER — Encounter: Payer: Self-pay | Admitting: Podiatry

## 2018-02-22 DIAGNOSIS — M722 Plantar fascial fibromatosis: Secondary | ICD-10-CM

## 2018-02-22 NOTE — Progress Notes (Signed)
She presents today for follow-up of her posterior tibial tendinitis bilaterally.  States the right one is great the left foot is hurting me like crazy.  My entire foot hurts as she points to the lateral aspect of the foot the dorsal aspect of the foot distally as well as the left heel stating that my heel is absolutely killing me.  Objective: Vital signs are stable she is alert and oriented x3.  Pulses are palpable.  She has pain on palpation medial calcaneal tubercle of the left heel.  Assessment: Plantar fasciitis with lateral compensatory syndrome left posterior tibial tendinitis of the left resolution of posterior tibial tendinitis and fasciitis right.  Plan: Discussed etiology pathology conservative versus surgical therapies.  At this point I injected her left heel today with 20 mg Kenalog 5 mg Marcaine tolerated procedure well.  We also readjusted her orthotics to prevent higher degree of supination.

## 2018-03-22 ENCOUNTER — Ambulatory Visit: Payer: BLUE CROSS/BLUE SHIELD | Admitting: Podiatry

## 2018-03-23 ENCOUNTER — Other Ambulatory Visit: Payer: Self-pay | Admitting: Family Medicine

## 2018-03-23 DIAGNOSIS — R609 Edema, unspecified: Secondary | ICD-10-CM

## 2018-03-29 ENCOUNTER — Ambulatory Visit: Payer: BLUE CROSS/BLUE SHIELD | Admitting: Podiatry

## 2018-03-29 ENCOUNTER — Encounter: Payer: Self-pay | Admitting: Podiatry

## 2018-03-29 DIAGNOSIS — M7751 Other enthesopathy of right foot: Secondary | ICD-10-CM

## 2018-03-29 DIAGNOSIS — M7750 Other enthesopathy of unspecified foot: Secondary | ICD-10-CM

## 2018-03-29 NOTE — Progress Notes (Signed)
She presents today for follow-up letter fasciitis of the left foot still sore behind the heel and right here she points to the anterior medial ankle she said her right foot is doing great now no no problems at all.  She would like to see Liliane Channel to get her orthotics adjusted.  Objective: Vital signs are stable alert and oriented x3.  Pulses are palpable.  Neurologic sensorium is intact.  Degenerative flexors are intact.  Muscle strength is normal symmetrical.  She has no pain on palpation range of motion or on palpation medial calcaneal tubercle of the right foot.  Left foot does demonstrate tenderness to the anterior medial ankle.  This is right adjacent to the extensor hallucis longus tendon.  Assessment: Extensor hallucis longus tendinitis.  Possible posterior tibial tendinitis behind the medial malleolus.  Plan: After sterile Betadine skin prep I injected 10 mg of Kenalog 5 mg Marcaine point maximal tenderness left anterior ankle.  I will follow-up with her in a month to see how she is doing with this.

## 2018-03-30 DIAGNOSIS — B360 Pityriasis versicolor: Secondary | ICD-10-CM | POA: Diagnosis not present

## 2018-03-30 DIAGNOSIS — L218 Other seborrheic dermatitis: Secondary | ICD-10-CM | POA: Diagnosis not present

## 2018-04-26 NOTE — Progress Notes (Signed)
Patient: Rachael Brewer Female    DOB: May 08, 1961   57 y.o.   MRN: [id number] Visit Date: 04/28/2018  Today's Provider: Lavon Paganini, MD   Chief Complaint  Patient presents with  . Memory Impairment  . Fatigue  . Dizziness   Subjective:    I, Tiburcio Pea, CMA, am acting as a scribe for Lavon Paganini, MD.   HPI Memory Impairment, Fatigue, & Dizziness:  Patient presents for a 3 month follow up. Last OV was on 02/09/2018. Patient advised to switch Topiramate dosing to bedtime and follow up with Neurologist to discuss alternative medication of dose titration. Patient reports fair compliance with treatment plan. She reports she is taking Topiramate at bedtime, but has not followed up with Neurology yet. She did cut her Topamax dose at night.  She believes her appointment is scheduled in December.She states memory impairment and dizziness has improved, but feeling fatigue and off balance is unchanged.  Migraines have been more frequent since decreasing Topamax dose.  She has been trying not to take a lot of OTC analgesics to avoid rebound headaches.  States that since taking in her grandchildren 4 years ago (they were abused), her stress levels have been very high.  She dreads them coming home from school each day.  She is not seeing a therapist for herself, but her grandchildren are.    Depression screen [facility] 2/9 04/27/2018 09/22/2017 06/24/2017 05/11/2017 12/15/2015  Decreased Interest 3 1 1 3 3   Down, Depressed, Hopeless 2 1 1 3  0  PHQ - 2 Score 5 2 2 6 3   Altered sleeping 3 0 2 0 0  Tired, decreased energy 3 1 3 3 3   Change in appetite 3 0 3 0 3  Feeling bad or failure about yourself  1 3 3 1 3   Trouble concentrating 0 0 1 0 0  Moving slowly or fidgety/restless 0 0 0 1 0  Suicidal thoughts 0 0 0 0 0  PHQ-9 Score 15 6 14 11 12   Difficult doing work/chores Very difficult Somewhat difficult Very difficult Somewhat difficult Not difficult at all       Allergies  Allergen  Reactions  . Sulfa Antibiotics Anaphylaxis  . Penicillins Hives  . Sumatriptan      Current Outpatient Medications:  .  albuterol (VENTOLIN HFA) 108 (90 Base) MCG/ACT inhaler, Inhale 1-2 puffs into the lungs every 6 (six) hours as needed for wheezing or shortness of breath., Disp: 1 Inhaler, Rfl: 3 .  aspirin-acetaminophen-caffeine (EXCEDRIN MIGRAINE) 250-250-65 MG tablet, Take by mouth., Disp: , Rfl:  .  Azelastine HCl 0.15 % SOLN, Place into the nose., Disp: , Rfl:  .  baclofen (LIORESAL) 10 MG tablet, Take 10 mg by mouth as needed. , Disp: , Rfl:  .  Cholecalciferol 1000 UNITS tablet, Take by mouth., Disp: , Rfl:  .  escitalopram (LEXAPRO) 20 MG tablet, TAKE 1 TABLET(20 MG) BY MOUTH DAILY, Disp: 30 tablet, Rfl: 3 .  gabapentin (NEURONTIN) 600 MG tablet, Take 600 mg by mouth 2 (two) times daily. , Disp: , Rfl:  .  hydrochlorothiazide (MICROZIDE) 12.5 MG capsule, TK 1 C PO D, Disp: , Rfl: 3 .  omeprazole (PRILOSEC) 40 MG capsule, TAKE 1 CAPSULE BY MOUTH EVERY DAY, Disp: 90 capsule, Rfl: 2 .  Potassium Chloride 40 MEQ/15ML (20%) SOLN, Take 40 mEq by mouth daily., Disp: 473 mL, Rfl: 3 .  QUDEXY XR 150 MG CS24 sprinkle capsule, TK 2 CS PO QHS, Disp: ,  Rfl: 5 .  rizatriptan (MAXALT) 10 MG tablet, Take 1 tablet by mouth as needed., Disp: , Rfl: 0 .  buPROPion (WELLBUTRIN) 100 MG tablet, Take 1 tablet (100 mg total) by mouth 2 (two) times daily., Disp: 60 tablet, Rfl: 3 .  torsemide (DEMADEX) 20 MG tablet, TAKE 2 TABLETS BY MOUTH EVERY DAY, Disp: 60 tablet, Rfl: 5  Review of Systems  Constitutional: Positive for appetite change and fatigue. Negative for activity change, chills, diaphoresis, fever and unexpected weight change.  Respiratory: Negative.   Cardiovascular: Negative.   Musculoskeletal: Negative.   Neurological: Negative for dizziness, tremors, seizures, syncope, facial asymmetry, speech difficulty, weakness, light-headedness, numbness and headaches.  Psychiatric/Behavioral:  Negative.     Social History   Tobacco Use  . Smoking status: Former Smoker    Packs/day: 1.00    Years: 15.00    Pack years: 15.00    Last attempt to quit: 06/21/1999    Years since quitting: 18.8  . Smokeless tobacco: Never Used  Substance Use Topics  . Alcohol use: Yes    Alcohol/week: 0.0 standard drinks    Comment: occasional   Objective:   BP 118/79 (BP Location: Right Arm, Patient Position: Sitting, Cuff Size: Large)   Pulse 64   Temp 98.8 F (37.1 C) (Oral)   Wt 235 lb (106.6 kg)   LMP  (LMP Unknown)   SpO2 95%   BMI 40.34 kg/m  Vitals:   04/27/18 0830  BP: 118/79  Pulse: 64  Temp: 98.8 F (37.1 C)  TempSrc: Oral  SpO2: 95%  Weight: 235 lb (106.6 kg)     Physical Exam  Constitutional: She is oriented to person, place, and time. She appears well-developed and well-nourished. No distress.  HENT:  Head: Normocephalic and atraumatic.  Right Ear: External ear normal.  Left Ear: External ear normal.  Nose: Nose normal.  Mouth/Throat: Oropharynx is clear and moist.  Eyes: Pupils are equal, round, and reactive to light. Conjunctivae are normal. Right eye exhibits no discharge. Left eye exhibits no discharge. No scleral icterus.  Neck: Neck supple. No thyromegaly present.  Cardiovascular: Normal rate, regular rhythm, normal heart sounds and intact distal pulses.  No murmur heard. Pulmonary/Chest: Effort normal and breath sounds normal. No respiratory distress. She has no wheezes. She has no rales.  Abdominal: Soft. She exhibits no distension. There is no tenderness.  Musculoskeletal: She exhibits no edema.  Lymphadenopathy:    She has no cervical adenopathy.  Neurological: She is alert and oriented to person, place, and time.  Skin: Skin is warm and dry. Capillary refill takes less than 2 seconds. No rash noted.  Psychiatric: Her behavior is normal. Her affect is blunt. Her speech is delayed. She exhibits a depressed mood. She expresses no homicidal and no  suicidal ideation. She expresses no suicidal plans and no homicidal plans. She is attentive.  Vitals reviewed.      Assessment & Plan:   Problem List Items Addressed This Visit      Cardiovascular and Mediastinum   Headache, migraine    Control has worsened since decreasing Topamax dose She has f/u with Neurology next month She may need to consider a different medicine as she is having migraines despite the highest dose of Topamax that she can tolerate      Relevant Medications   buPROPion (WELLBUTRIN) 100 MG tablet     Other   Depression, recurrent ([place]) - Primary    Uncontrolled Continue Lexapro Will add Wellbutrin to Lexapro F/u  in 1 mont hand consider dose titration Repeat PHQ9 at next visit      Relevant Medications   buPROPion (WELLBUTRIN) 100 MG tablet   Fatigue    Chronic and multifactorial problem Recent labs within normal limits Using CPAP as prescribed Most likely related to her depression and stress levels She we will try Wellbutrin as above which may be slightly activating          Return in about 4 weeks (around 05/25/2018) for Depression f/u.   The entirety of the information documented in the History of Present Illness, Review of Systems and Physical Exam were personally obtained by me. Portions of this information were initially documented by Tiburcio Pea, CMA and reviewed by me for thoroughness and accuracy.    Virginia Crews, MD, MPH [facility] 04/28/2018 1:16 PM

## 2018-04-27 ENCOUNTER — Ambulatory Visit: Payer: BLUE CROSS/BLUE SHIELD | Admitting: Family Medicine

## 2018-04-27 ENCOUNTER — Other Ambulatory Visit: Payer: Self-pay | Admitting: Family Medicine

## 2018-04-27 ENCOUNTER — Encounter: Payer: Self-pay | Admitting: Family Medicine

## 2018-04-27 VITALS — BP 118/79 | HR 64 | Temp 98.8°F | Wt 235.0 lb

## 2018-04-27 DIAGNOSIS — R609 Edema, unspecified: Secondary | ICD-10-CM

## 2018-04-27 DIAGNOSIS — R5382 Chronic fatigue, unspecified: Secondary | ICD-10-CM | POA: Diagnosis not present

## 2018-04-27 DIAGNOSIS — F339 Major depressive disorder, recurrent, unspecified: Secondary | ICD-10-CM

## 2018-04-27 DIAGNOSIS — G43709 Chronic migraine without aura, not intractable, without status migrainosus: Secondary | ICD-10-CM | POA: Diagnosis not present

## 2018-04-27 MED ORDER — BUPROPION HCL 100 MG PO TABS: 100.0000 mg | ORAL_TABLET | Freq: Two times a day (BID) | ORAL | 3 refills | Status: DC

## 2018-04-27 NOTE — Assessment & Plan Note (Addendum)
Control has worsened since decreasing Topamax dose She has f/u with Neurology next month She may need to consider a different medicine as she is having migraines despite the highest dose of Topamax that she can tolerate

## 2018-04-27 NOTE — Patient Instructions (Signed)
[facility]  Add Wellbutrin to your other meds

## 2018-04-28 NOTE — Assessment & Plan Note (Signed)
Chronic and multifactorial problem Recent labs within normal limits Using CPAP as prescribed Most likely related to her depression and stress levels She we will try Wellbutrin as above which may be slightly activating

## 2018-04-28 NOTE — Telephone Encounter (Signed)
See refill request.

## 2018-04-28 NOTE — Assessment & Plan Note (Signed)
Uncontrolled Continue Lexapro Will add Wellbutrin to Lexapro F/u in 1 mont hand consider dose titration Repeat PHQ9 at next visit

## 2018-05-03 ENCOUNTER — Ambulatory Visit: Payer: BLUE CROSS/BLUE SHIELD | Admitting: Podiatry

## 2018-05-03 ENCOUNTER — Encounter: Payer: Self-pay | Admitting: Podiatry

## 2018-05-03 DIAGNOSIS — M7752 Other enthesopathy of left foot: Secondary | ICD-10-CM | POA: Diagnosis not present

## 2018-05-03 DIAGNOSIS — M722 Plantar fascial fibromatosis: Secondary | ICD-10-CM | POA: Diagnosis not present

## 2018-05-03 NOTE — Progress Notes (Signed)
She presents today for follow-up of her left foot and ankle.  States it is hurting really badly especially when I turn it to the outside.  Objective: Vital signs are stable she is alert and oriented x3.  Pulses are palpable.  Neurologic sensorium is intact.  She has pain on palpation of the sinus tarsitis end of the subtalar joint.  She also has pain on palpation medial calcaneal tubercle of the left heel.  Her graph assessment: Plantar fasciitis left resulting in lateral compensatory syndrome at the subtalar joint capsulitis.  Assessment: Pain in limb secondary to capsulitis of the subtalar joint and plantar fasciitis.  Plan: After sterile Betadine skin prep I injected both sides today with 20 mg Kenalog 5 mg Marcaine point of maximal tenderness.  Into the sinus tarsi and the medial aspect of the heel.  Tolerated procedure well without complications discussed appropriate shoe gear stretching exercise ice therapy and shoe gear modifications.

## 2018-05-08 DIAGNOSIS — F331 Major depressive disorder, recurrent, moderate: Secondary | ICD-10-CM | POA: Diagnosis not present

## 2018-05-10 ENCOUNTER — Ambulatory Visit: Payer: BLUE CROSS/BLUE SHIELD | Admitting: Podiatry

## 2018-05-12 ENCOUNTER — Ambulatory Visit: Payer: Self-pay | Admitting: Family Medicine

## 2018-05-15 ENCOUNTER — Encounter: Payer: Self-pay | Admitting: Family Medicine

## 2018-05-16 DIAGNOSIS — H524 Presbyopia: Secondary | ICD-10-CM | POA: Diagnosis not present

## 2018-05-16 DIAGNOSIS — H52223 Regular astigmatism, bilateral: Secondary | ICD-10-CM | POA: Diagnosis not present

## 2018-05-16 DIAGNOSIS — H5203 Hypermetropia, bilateral: Secondary | ICD-10-CM | POA: Diagnosis not present

## 2018-05-22 ENCOUNTER — Other Ambulatory Visit: Payer: Self-pay | Admitting: Family Medicine

## 2018-05-22 DIAGNOSIS — K219 Gastro-esophageal reflux disease without esophagitis: Secondary | ICD-10-CM

## 2018-05-22 DIAGNOSIS — F331 Major depressive disorder, recurrent, moderate: Secondary | ICD-10-CM | POA: Diagnosis not present

## 2018-05-25 NOTE — Progress Notes (Deleted)
Patient: Rachael Brewer Female    DOB: 1960/09/09   57 y.o.   MRN: [id number] Visit Date: 05/25/2018  Today's Provider: Lavon Paganini, MD   No chief complaint on file.  Subjective:    HPI Depression:  Patient presents for a 1 month follow up. Last OV was on 04/27/2018. Patient advised to continue Lexapro and add Wellbutrin 100 mg. She reports good compliance with treatment plan. She states symptoms are   GAD 7 : Generalized Anxiety Score 09/22/2017 06/24/2017 05/11/2017  Nervous, Anxious, on Edge 0 2 1  Control/stop worrying 0 2 0  Worry too much - different things 1 0 0  Trouble relaxing 0 3 1  Restless 0 0 0  Easily annoyed or irritable 1 3 3   Afraid - awful might happen 0 0 0  Total GAD 7 Score 2 10 5   Anxiety Difficulty Somewhat difficult Somewhat difficult Very difficult   Depression screen [facility] 2/9 04/27/2018 09/22/2017 06/24/2017  Decreased Interest 3 1 1   Down, Depressed, Hopeless 2 1 1   PHQ - 2 Score 5 2 2   Altered sleeping 3 0 2  Tired, decreased energy 3 1 3   Change in appetite 3 0 3  Feeling bad or failure about yourself  1 3 3   Trouble concentrating 0 0 1  Moving slowly or fidgety/restless 0 0 0  Suicidal thoughts 0 0 0  PHQ-9 Score 15 6 14   Difficult doing work/chores Very difficult Somewhat difficult Very difficult      Allergies  Allergen Reactions  . Sulfa Antibiotics Anaphylaxis  . Penicillins Hives  . Sumatriptan      Current Outpatient Medications:  .  albuterol (VENTOLIN HFA) 108 (90 Base) MCG/ACT inhaler, Inhale 1-2 puffs into the lungs every 6 (six) hours as needed for wheezing or shortness of breath., Disp: 1 Inhaler, Rfl: 3 .  aspirin-acetaminophen-caffeine (EXCEDRIN MIGRAINE) 250-250-65 MG tablet, Take by mouth., Disp: , Rfl:  .  Azelastine HCl 0.15 % SOLN, Place into the nose., Disp: , Rfl:  .  baclofen (LIORESAL) 10 MG tablet, Take 10 mg by mouth as needed. , Disp: , Rfl:  .  buPROPion (WELLBUTRIN) 100 MG tablet, Take 1 tablet (100  mg total) by mouth 2 (two) times daily., Disp: 60 tablet, Rfl: 3 .  Cholecalciferol 1000 UNITS tablet, Take by mouth., Disp: , Rfl:  .  escitalopram (LEXAPRO) 20 MG tablet, TAKE 1 TABLET(20 MG) BY MOUTH DAILY, Disp: 30 tablet, Rfl: 3 .  gabapentin (NEURONTIN) 600 MG tablet, Take 600 mg by mouth 2 (two) times daily. , Disp: , Rfl:  .  hydrochlorothiazide (MICROZIDE) 12.5 MG capsule, TK 1 C PO D, Disp: , Rfl: 3 .  omeprazole (PRILOSEC) 40 MG capsule, TAKE 1 CAPSULE BY MOUTH EVERY DAY, Disp: 90 capsule, Rfl: 3 .  Potassium Chloride 40 MEQ/15ML (20%) SOLN, Take 40 mEq by mouth daily., Disp: 473 mL, Rfl: 3 .  QUDEXY XR 150 MG CS24 sprinkle capsule, TK 2 CS PO QHS, Disp: , Rfl: 5 .  rizatriptan (MAXALT) 10 MG tablet, Take 1 tablet by mouth as needed., Disp: , Rfl: 0 .  torsemide (DEMADEX) 20 MG tablet, TAKE 2 TABLETS BY MOUTH EVERY DAY, Disp: 60 tablet, Rfl: 5  Review of Systems  Constitutional: Negative.   Respiratory: Negative.   Cardiovascular: Negative.   Musculoskeletal: Negative.   Psychiatric/Behavioral:       Depression     Social History   Tobacco Use  . Smoking status:  Former Smoker    Packs/day: 1.00    Years: 15.00    Pack years: 15.00    Last attempt to quit: 06/21/1999    Years since quitting: 18.9  . Smokeless tobacco: Never Used  Substance Use Topics  . Alcohol use: Yes    Alcohol/week: 0.0 standard drinks    Comment: occasional   Objective:   LMP  (LMP Unknown)  There were no vitals filed for this visit.   Physical Exam      Assessment & Plan:           Lavon Paganini, MD  [facility]

## 2018-05-26 ENCOUNTER — Ambulatory Visit: Payer: BLUE CROSS/BLUE SHIELD | Admitting: Family Medicine

## 2018-05-29 ENCOUNTER — Ambulatory Visit: Payer: BLUE CROSS/BLUE SHIELD | Admitting: Family Medicine

## 2018-05-29 ENCOUNTER — Encounter: Payer: Self-pay | Admitting: Family Medicine

## 2018-05-29 VITALS — BP 127/79 | HR 70 | Temp 98.7°F | Wt 234.6 lb

## 2018-05-29 DIAGNOSIS — F339 Major depressive disorder, recurrent, unspecified: Secondary | ICD-10-CM

## 2018-05-29 MED ORDER — BUPROPION HCL 100 MG PO TABS: 100.0000 mg | ORAL_TABLET | Freq: Two times a day (BID) | ORAL | 3 refills | Status: DC

## 2018-05-29 NOTE — Progress Notes (Signed)
Patient: Rachael Brewer Female    DOB: 02/12/1961   57 y.o.   MRN: [id number] Visit Date: 05/29/2018  Today's Provider: Lavon Paganini, MD   Chief Complaint  Patient presents with  . Depression   Subjective:    HPI Depression: Patient presents for a 1 month follow up. Last OV was on 04/27/2018. Patient advised to continue Lexapro and added Wellbutrin. Patient reports good compliance with treatment plan, but would like to up the dose. She thought she was only supposed to take 1/2 pill twice daily.  She states no symptoms at this time.  Started seeing Janett Billow at [facility] as well.  Has only been a few times, but already feeling like it is helpful.  Depression screen [facility] 2/9 05/29/2018 04/27/2018 09/22/2017  Decreased Interest 2 3 1   Down, Depressed, Hopeless 2 2 1   PHQ - 2 Score 4 5 2   Altered sleeping 3 3 0  Tired, decreased energy 3 3 1   Change in appetite 1 3 0  Feeling bad or failure about yourself  2 1 3   Trouble concentrating 0 0 0  Moving slowly or fidgety/restless 1 0 0  Suicidal thoughts 0 0 0  PHQ-9 Score 14 15 6   Difficult doing work/chores Very difficult Very difficult Somewhat difficult      Allergies  Allergen Reactions  . Sulfa Antibiotics Anaphylaxis  . Penicillins Hives  . Sumatriptan      Current Outpatient Medications:  .  albuterol (VENTOLIN HFA) 108 (90 Base) MCG/ACT inhaler, Inhale 1-2 puffs into the lungs every 6 (six) hours as needed for wheezing or shortness of breath., Disp: 1 Inhaler, Rfl: 3 .  aspirin-acetaminophen-caffeine (EXCEDRIN MIGRAINE) 250-250-65 MG tablet, Take by mouth., Disp: , Rfl:  .  Azelastine HCl 0.15 % SOLN, Place into the nose., Disp: , Rfl:  .  baclofen (LIORESAL) 10 MG tablet, Take 10 mg by mouth as needed. , Disp: , Rfl:  .  buPROPion (WELLBUTRIN) 100 MG tablet, Take 1 tablet (100 mg total) by mouth 2 (two) times daily., Disp: 60 tablet, Rfl: 3 .  Cholecalciferol 1000 UNITS tablet, Take by mouth., Disp: ,  Rfl:  .  escitalopram (LEXAPRO) 20 MG tablet, TAKE 1 TABLET(20 MG) BY MOUTH DAILY, Disp: 30 tablet, Rfl: 3 .  gabapentin (NEURONTIN) 600 MG tablet, Take 600 mg by mouth 2 (two) times daily. , Disp: , Rfl:  .  hydrochlorothiazide (MICROZIDE) 12.5 MG capsule, TK 1 C PO D, Disp: , Rfl: 3 .  omeprazole (PRILOSEC) 40 MG capsule, TAKE 1 CAPSULE BY MOUTH EVERY DAY, Disp: 90 capsule, Rfl: 3 .  Potassium Chloride 40 MEQ/15ML (20%) SOLN, Take 40 mEq by mouth daily., Disp: 473 mL, Rfl: 3 .  QUDEXY XR 150 MG CS24 sprinkle capsule, TK 2 CS PO QHS, Disp: , Rfl: 5 .  rizatriptan (MAXALT) 10 MG tablet, Take 1 tablet by mouth as needed., Disp: , Rfl: 0 .  torsemide (DEMADEX) 20 MG tablet, TAKE 2 TABLETS BY MOUTH EVERY DAY, Disp: 60 tablet, Rfl: 5  Review of Systems  Constitutional: Negative.   Respiratory: Negative.   Cardiovascular: Negative.   Musculoskeletal: Negative.   Psychiatric/Behavioral: Negative.        Depression     Social History   Tobacco Use  . Smoking status: Former Smoker    Packs/day: 1.00    Years: 15.00    Pack years: 15.00    Last attempt to quit: 06/21/1999    Years since quitting:  18.9  . Smokeless tobacco: Never Used  Substance Use Topics  . Alcohol use: Yes    Alcohol/week: 0.0 standard drinks    Comment: occasional   Objective:   BP 127/79 (BP Location: Left Arm, Patient Position: Sitting, Cuff Size: Normal)   Pulse 70   Temp 98.7 F (37.1 C) (Oral)   Wt 234 lb 9.6 oz (106.4 kg)   LMP  (LMP Unknown)   SpO2 97%   BMI 40.27 kg/m  Vitals:   05/29/18 1048  BP: 127/79  Pulse: 70  Temp: 98.7 F (37.1 C)  TempSrc: Oral  SpO2: 97%  Weight: 234 lb 9.6 oz (106.4 kg)     Physical Exam  Constitutional: She is oriented to person, place, and time. She appears well-developed and well-nourished. No distress.  HENT:  Head: Normocephalic.  Mouth/Throat: Oropharynx is clear and moist.  Eyes: Conjunctivae are normal. No scleral icterus.  Neck: Neck supple. No  thyromegaly present.  Cardiovascular: Normal rate, regular rhythm and normal heart sounds.  No murmur heard. Pulmonary/Chest: Effort normal and breath sounds normal. No respiratory distress. She has no wheezes. She has no rales.  Abdominal: Soft. She exhibits no distension. There is no tenderness.  Musculoskeletal: She exhibits no edema.  Lymphadenopathy:    She has no cervical adenopathy.  Neurological: She is alert and oriented to person, place, and time.  Skin: Skin is warm and dry. Capillary refill takes less than 2 seconds. No rash noted.  Psychiatric: Her speech is normal and behavior is normal. Her affect is blunt. She exhibits a depressed mood. She expresses no homicidal and no suicidal ideation.  Vitals reviewed.      Assessment & Plan:   Problem List Items Addressed This Visit      Other   Depression, recurrent ([place]) - Primary    Uncontrolled Improved somewhat since starting Bupropion Will increase wellbutrin to 100mg  BID, which she was supposed to be taking last time F/u in ~1-2 months and repeat PHQ9      Relevant Medications   buPROPion (WELLBUTRIN) 100 MG tablet       Return in about 6 weeks (around 07/10/2018) for CPE.   The entirety of the information documented in the History of Present Illness, Review of Systems and Physical Exam were personally obtained by me. Portions of this information were initially documented by Tiburcio Pea and Hurman Horn, CMA and reviewed by me for thoroughness and accuracy.    Virginia Crews, MD, MPH [facility] 05/29/2018 1:03 PM

## 2018-05-29 NOTE — Assessment & Plan Note (Signed)
Uncontrolled Improved somewhat since starting Bupropion Will increase wellbutrin to 100mg  BID, which she was supposed to be taking last time F/u in ~1-2 months and repeat PHQ9

## 2018-05-29 NOTE — Patient Instructions (Signed)
Increase Wellbutrin to a whole pill twice daily

## 2018-06-01 DIAGNOSIS — G43019 Migraine without aura, intractable, without status migrainosus: Secondary | ICD-10-CM | POA: Diagnosis not present

## 2018-06-01 DIAGNOSIS — G43719 Chronic migraine without aura, intractable, without status migrainosus: Secondary | ICD-10-CM | POA: Diagnosis not present

## 2018-06-01 DIAGNOSIS — G43839 Menstrual migraine, intractable, without status migrainosus: Secondary | ICD-10-CM | POA: Diagnosis not present

## 2018-06-05 DIAGNOSIS — F331 Major depressive disorder, recurrent, moderate: Secondary | ICD-10-CM | POA: Diagnosis not present

## 2018-06-28 ENCOUNTER — Encounter (INDEPENDENT_AMBULATORY_CARE_PROVIDER_SITE_OTHER): Payer: BLUE CROSS/BLUE SHIELD | Admitting: Podiatry

## 2018-06-28 NOTE — Progress Notes (Signed)
This encounter was created in error - please disregard.

## 2018-06-29 DIAGNOSIS — F331 Major depressive disorder, recurrent, moderate: Secondary | ICD-10-CM | POA: Diagnosis not present

## 2018-06-30 DIAGNOSIS — L710 Perioral dermatitis: Secondary | ICD-10-CM | POA: Diagnosis not present

## 2018-07-10 ENCOUNTER — Encounter: Payer: Self-pay | Admitting: Podiatry

## 2018-07-10 ENCOUNTER — Ambulatory Visit: Payer: BLUE CROSS/BLUE SHIELD | Admitting: Podiatry

## 2018-07-10 DIAGNOSIS — M7752 Other enthesopathy of left foot: Secondary | ICD-10-CM | POA: Diagnosis not present

## 2018-07-10 DIAGNOSIS — M722 Plantar fascial fibromatosis: Secondary | ICD-10-CM | POA: Diagnosis not present

## 2018-07-10 NOTE — Progress Notes (Signed)
She presents today chief complaint of painful plantar fasciitis left and subtalar joint pain left.  She states that all of it seems to be better than it was previously particularly this ankle as she points to the sinus tarsi of the left foot.  Objective: Vital signs are stable alert and oriented x3 pain on palpation subtalar joint left.  Pain on palpation medial calcaneal tubercle left.  Assessment: Subtalar joint capsulitis plantar fasciitis left.  Plan: After sterile Betadine skin prep I injected 10 mg Kenalog subtalar joint left with local anesthetic.  And 10 mg of Kenalog with local anesthetic to the plantar fascia at its insertion site plantar medial aspect of the right heel.  Tolerated procedure well without complications we will follow-up with her in 2 to 3 months if necessary.

## 2018-07-20 ENCOUNTER — Encounter: Payer: Self-pay | Admitting: Family Medicine

## 2018-07-31 ENCOUNTER — Other Ambulatory Visit: Payer: Self-pay | Admitting: Family Medicine

## 2018-07-31 MED ORDER — POTASSIUM CHLORIDE 40 MEQ/15ML (20%) PO SOLN: 40.0000 meq | Freq: Every day | ORAL | 3 refills | Status: DC

## 2018-07-31 NOTE — Telephone Encounter (Signed)
Pt needing refill on: Potassium Chloride 40 MEQ/15ML (20%) SOLN - pt is out of RX    Please fill at:  [facility] Address: [address], [place], [place] [address] Phone: [phone]

## 2018-08-10 DIAGNOSIS — F331 Major depressive disorder, recurrent, moderate: Secondary | ICD-10-CM | POA: Diagnosis not present

## 2018-09-07 DIAGNOSIS — F331 Major depressive disorder, recurrent, moderate: Secondary | ICD-10-CM | POA: Diagnosis not present

## 2018-09-08 ENCOUNTER — Encounter: Payer: Self-pay | Admitting: Family Medicine

## 2018-09-08 NOTE — Progress Notes (Deleted)
Patient: Rachael Brewer, Female    DOB: 17-Feb-1961, 58 y.o.   MRN: [id number] Visit Date: 09/08/2018  Today's Provider: Lavon Paganini, MD   No chief complaint on file.  Subjective:  I, Tiburcio Pea, CMA, am acting as a scribe for Lavon Paganini, MD.    Annual physical exam Rachael Brewer is a 58 y.o. female who presents today for health maintenance and complete physical. She feels {DESC; WELL/FAIRLY WELL/POORLY:18703}. She reports exercising ***. She reports she is sleeping {DESC; WELL/FAIRLY WELL/POORLY:18703}.  -----------------------------------------------------------------   Review of Systems  Constitutional: Negative.   HENT: Negative.   Eyes: Negative.   Respiratory: Negative.   Cardiovascular: Negative.   Gastrointestinal: Negative.   Endocrine: Negative.   Genitourinary: Negative.   Musculoskeletal: Negative.   Skin: Negative.   Allergic/Immunologic: Negative.   Neurological: Negative.   Hematological: Negative.   Psychiatric/Behavioral: Negative.     Social History She  reports that she quit smoking about 19 years ago. She has a 15.00 pack-year smoking history. She has never used smokeless tobacco. She reports current alcohol use. She reports that she does not use drugs. Social History   Socioeconomic History  . Marital status: Married    Spouse name: Not on file  . Number of children: Not on file  . Years of education: Not on file  . Highest education level: Not on file  Occupational History  . Not on file  Social Needs  . Financial resource strain: Not on file  . Food insecurity:    Worry: Not on file    Inability: Not on file  . Transportation needs:    Medical: Not on file    Non-medical: Not on file  Tobacco Use  . Smoking status: Former Smoker    Packs/day: 1.00    Years: 15.00    Pack years: 15.00    Last attempt to quit: 06/21/1999    Years since quitting: 19.2  . Smokeless tobacco: Never Used  Substance and Sexual  Activity  . Alcohol use: Yes    Alcohol/week: 0.0 standard drinks    Comment: occasional  . Drug use: No  . Sexual activity: Yes    Birth control/protection: None  Lifestyle  . Physical activity:    Days per week: Not on file    Minutes per session: Not on file  . Stress: Not on file  Relationships  . Social connections:    Talks on phone: Not on file    Gets together: Not on file    Attends religious service: Not on file    Active member of club or organization: Not on file    Attends meetings of clubs or organizations: Not on file    Relationship status: Not on file  Other Topics Concern  . Not on file  Social History Narrative  . Not on file    Patient Active Problem List   Diagnosis Date Noted  . External hemorrhoid 09/22/2017  . Hypokalemia 06/24/2017  . Lymphedema 05/07/2016  . Obesity 01/01/2016  . Bilateral edema of lower extremity 01/01/2016  . Venous insufficiency 01/01/2016  . Pes planus of both feet 01/01/2016  . Apnea 01/01/2016  . AB (asthmatic bronchitis) 06/10/2015  . Allergic rhinitis 06/10/2015  . Benign paroxysmal positional nystagmus 06/10/2015  . Fatigue 06/10/2015  . Acid reflux 06/10/2015  . Blood glucose elevated 06/10/2015  . Lesion of nose 06/10/2015  . Low back strain 06/10/2015  . Hemorrhage, postmenopausal 06/10/2015  . Plantar fasciitis  12/02/2014  . Posterior tibial tendonitis 12/02/2014  . Arthropathia 12/18/2008  . Depression, recurrent ([place]) 07/21/2006  . Hypercholesteremia 07/21/2006  . BP (high blood pressure) 07/21/2006  . Headache, migraine 07/21/2006    Past Surgical History:  Procedure Laterality Date  . ANTERIOR CRUCIATE LIGAMENT REPAIR Right 1996  . DILATION AND CURETTAGE OF UTERUS  2004  . FOOT SURGERY    . KNEE SURGERY Right     Family History  Family Status  Relation Name Status  . Mother  Alive  . Father  Deceased  . Brother  Alive  . Other  (Not Specified)  . Mat Aunt  (Not Specified)   Her family  history includes Breast cancer in her maternal aunt; Diabetes in her brother and another family member; Glaucoma in an other family member; Hypertension in her brother and another family member.     Allergies  Allergen Reactions  . Sulfa Antibiotics Anaphylaxis  . Penicillins Hives  . Sumatriptan     Previous Medications   ALBUTEROL (VENTOLIN HFA) 108 (90 BASE) MCG/ACT INHALER    Inhale 1-2 puffs into the lungs every 6 (six) hours as needed for wheezing or shortness of breath.   ASPIRIN-ACETAMINOPHEN-CAFFEINE (EXCEDRIN MIGRAINE) 250-250-65 MG TABLET    Take by mouth.   AZELASTINE HCL 0.15 % SOLN    Place into the nose.   BACLOFEN (LIORESAL) 10 MG TABLET    Take 10 mg by mouth as needed.    BUPROPION (WELLBUTRIN) 100 MG TABLET    Take 1 tablet (100 mg total) by mouth 2 (two) times daily.   CHOLECALCIFEROL 1000 UNITS TABLET    Take by mouth.   ESCITALOPRAM (LEXAPRO) 20 MG TABLET    TAKE 1 TABLET(20 MG) BY MOUTH DAILY   GABAPENTIN (NEURONTIN) 600 MG TABLET    Take 600 mg by mouth 2 (two) times daily.    HYDROCHLOROTHIAZIDE (MICROZIDE) 12.5 MG CAPSULE    TK 1 C PO D   OMEPRAZOLE (PRILOSEC) 40 MG CAPSULE    TAKE 1 CAPSULE BY MOUTH EVERY DAY   POTASSIUM CHLORIDE 40 MEQ/15ML (20%) SOLN    Take 40 mEq by mouth daily.   QUDEXY XR 150 MG CS24 SPRINKLE CAPSULE    TK 2 CS PO QHS   RIZATRIPTAN (MAXALT) 10 MG TABLET    Take 1 tablet by mouth as needed.   TORSEMIDE (DEMADEX) 20 MG TABLET    TAKE 2 TABLETS BY MOUTH EVERY DAY    Patient Care Team: Virginia Crews, MD as PCP - General (Family Medicine)      Objective:   Vitals: LMP  (LMP Unknown)    Physical Exam   Depression Screen PHQ 2/9 Scores 05/29/2018 04/27/2018 09/22/2017 06/24/2017  PHQ - 2 Score 4 5 2 2   PHQ- 9 Score 14 15 6 14       Assessment & Plan:     Routine Health Maintenance and Physical Exam  Exercise Activities and Dietary recommendations Goals    . Exercise 150 minutes per week (moderate activity)        Immunization History  Administered Date(s) Administered  . Influenza Inj Mdck Quad Pf 03/13/2018  . Influenza,inj,Quad PF,6+ Mos 04/02/2017, 02/09/2018  . Influenza,inj,quad, With Preservative 04/02/2017  . Influenza-Unspecified 05/17/2016  . Td 07/22/2000  . Tdap 05/11/2017    Health Maintenance  Topic Date Due  . HIV Screening  08/17/1974  . MAMMOGRAM  11/05/2019  . PAP SMEAR-Modifier  12/14/2020  . COLONOSCOPY  10/10/2021  . TETANUS/TDAP  05/12/2027  . INFLUENZA VACCINE  Completed  . Hepatitis C Screening  Completed     Discussed health benefits of physical activity, and encouraged her to engage in regular exercise appropriate for her age and condition.    --------------------------------------------------------------------

## 2018-09-13 ENCOUNTER — Ambulatory Visit: Payer: BLUE CROSS/BLUE SHIELD | Admitting: Podiatry

## 2018-09-23 ENCOUNTER — Other Ambulatory Visit: Payer: Self-pay | Admitting: Family Medicine

## 2018-10-12 DIAGNOSIS — F331 Major depressive disorder, recurrent, moderate: Secondary | ICD-10-CM | POA: Diagnosis not present

## 2018-10-24 ENCOUNTER — Other Ambulatory Visit: Payer: Self-pay | Admitting: Family Medicine

## 2018-11-08 ENCOUNTER — Encounter: Payer: Self-pay | Admitting: Podiatry

## 2018-11-08 ENCOUNTER — Ambulatory Visit: Payer: BLUE CROSS/BLUE SHIELD | Admitting: Podiatry

## 2018-11-08 ENCOUNTER — Other Ambulatory Visit: Payer: Self-pay

## 2018-11-08 VITALS — Temp 97.8°F

## 2018-11-08 DIAGNOSIS — M722 Plantar fascial fibromatosis: Secondary | ICD-10-CM

## 2018-11-08 DIAGNOSIS — M7752 Other enthesopathy of left foot: Secondary | ICD-10-CM | POA: Diagnosis not present

## 2018-11-08 NOTE — Progress Notes (Signed)
She presents today for continued plantar fasciitis of the left foot and subtalar joint pain.  States that I think it seems to be getting worse instead of better we have tried multiple treatments nothing seems to be working.  Objective: Vital signs are stable alert and oriented x3.  She has severe pain on palpation medial calcaneal tubercle and of the subtalar joint as well.  Also has pain on palpation of the sinus tarsi.  Assessment: Plantar fasciitis chronic in nature probable rupture of the plantar fascia.  Plan: Discussed etiology pathology and surgical therapies at this point number request a MRI for surgical evaluation.

## 2018-11-21 ENCOUNTER — Other Ambulatory Visit: Payer: Self-pay | Admitting: Family Medicine

## 2018-11-21 NOTE — Telephone Encounter (Signed)
Left patient a message to call back to schedule a follow up appointment.

## 2018-11-21 NOTE — Telephone Encounter (Signed)
Please advise refill? 

## 2018-11-21 NOTE — Telephone Encounter (Signed)
One refill given.  Needs depression f/u evisit

## 2018-12-18 DIAGNOSIS — H40013 Open angle with borderline findings, low risk, bilateral: Secondary | ICD-10-CM | POA: Diagnosis not present

## 2018-12-18 DIAGNOSIS — H5203 Hypermetropia, bilateral: Secondary | ICD-10-CM | POA: Diagnosis not present

## 2018-12-18 DIAGNOSIS — H524 Presbyopia: Secondary | ICD-10-CM | POA: Diagnosis not present

## 2018-12-18 DIAGNOSIS — H52223 Regular astigmatism, bilateral: Secondary | ICD-10-CM | POA: Diagnosis not present

## 2019-01-18 ENCOUNTER — Other Ambulatory Visit: Payer: Self-pay

## 2019-01-18 DIAGNOSIS — M722 Plantar fascial fibromatosis: Secondary | ICD-10-CM

## 2019-01-18 NOTE — Progress Notes (Signed)
Per Lucienne Capers, no precert required for this patient Patient has been notified and she will call scheduling to set up own appt to her convenience

## 2019-01-21 ENCOUNTER — Other Ambulatory Visit: Payer: Self-pay | Admitting: Family Medicine

## 2019-01-24 ENCOUNTER — Other Ambulatory Visit: Payer: Self-pay | Admitting: Family Medicine

## 2019-01-25 ENCOUNTER — Telehealth: Payer: Self-pay | Admitting: *Deleted

## 2019-01-25 NOTE — Telephone Encounter (Signed)
[facility] states pt has MRI that needs to be pre-certed Monday.

## 2019-01-25 NOTE — Telephone Encounter (Signed)
I called [employer] again today and verified with Gae Bon A. With [facility] that no precert is required from [facility] for MRI.    I called Gabriel Cirri back and informed her of information via voice mail

## 2019-01-28 ENCOUNTER — Ambulatory Visit
Admission: RE | Admit: 2019-01-28 | Discharge: 2019-01-28 | Disposition: A | Discharge status: Home / Self Care | Payer: BC Managed Care – PPO | Source: Ambulatory Visit | Attending: Podiatry | Admitting: Podiatry

## 2019-01-28 DIAGNOSIS — R600 Localized edema: Secondary | ICD-10-CM | POA: Diagnosis not present

## 2019-01-28 DIAGNOSIS — M722 Plantar fascial fibromatosis: Secondary | ICD-10-CM | POA: Diagnosis not present

## 2019-01-30 ENCOUNTER — Telehealth: Payer: Self-pay | Admitting: *Deleted

## 2019-01-30 NOTE — Telephone Encounter (Signed)
-----   Message from Garrel Ridgel, [place] sent at 01/30/2019  7:08 AM EDT ----- Send for over read and inform patient of the delay please.

## 2019-01-30 NOTE — Telephone Encounter (Signed)
I called pt and informed pt of Dr. Stephenie Acres review of results and request to send a copy of the MRI disc to a radiology specialist, there will be a 10-14 day delay in final results, and we would call once final results are received. Pt asked what she should do her foot is still hurting.

## 2019-01-31 NOTE — Telephone Encounter (Signed)
Faxed request for copy of MRI disc to [facility].

## 2019-02-12 DIAGNOSIS — M25561 Pain in right knee: Secondary | ICD-10-CM | POA: Diagnosis not present

## 2019-02-12 DIAGNOSIS — M25562 Pain in left knee: Secondary | ICD-10-CM | POA: Diagnosis not present

## 2019-02-12 DIAGNOSIS — M172 Bilateral post-traumatic osteoarthritis of knee: Secondary | ICD-10-CM | POA: Diagnosis not present

## 2019-02-13 NOTE — Telephone Encounter (Signed)
Faxed copy of MRI disc to [facility].

## 2019-02-20 ENCOUNTER — Telehealth: Payer: Self-pay

## 2019-02-20 NOTE — Telephone Encounter (Signed)
I spoke with Charlie at [facility], they never received a disc.  I will pick up disc at [facility] today and mail to [facility].  Patient is aware of delay and will cancel appt for tomorrow and reschedule for couple weeks out.

## 2019-02-21 ENCOUNTER — Ambulatory Visit: Payer: BLUE CROSS/BLUE SHIELD | Admitting: Podiatry

## 2019-02-22 DIAGNOSIS — L710 Perioral dermatitis: Secondary | ICD-10-CM | POA: Diagnosis not present

## 2019-03-14 ENCOUNTER — Ambulatory Visit: Payer: Self-pay | Admitting: Podiatry

## 2019-03-14 ENCOUNTER — Encounter: Payer: Self-pay | Admitting: Podiatry

## 2019-03-19 ENCOUNTER — Ambulatory Visit: Payer: BC Managed Care – PPO | Admitting: Podiatry

## 2019-03-19 ENCOUNTER — Encounter: Payer: Self-pay | Admitting: Podiatry

## 2019-03-19 ENCOUNTER — Other Ambulatory Visit: Payer: Self-pay

## 2019-03-19 DIAGNOSIS — M722 Plantar fascial fibromatosis: Secondary | ICD-10-CM

## 2019-03-19 NOTE — Patient Instructions (Signed)
Pre-Operative Instructions  Congratulations, you have decided to take an important step towards improving your quality of life.  You can be assured that the doctors and staff at [facility] will be with you every step of the way.  Here are some important things you should know:  1. Plan to be at the surgery center/hospital at least 1 (one) hour prior to your scheduled time, unless otherwise directed by the surgical center/hospital staff.  You must have a responsible adult accompany you, remain during the surgery and drive you home.  Make sure you have directions to the surgical center/hospital to ensure you arrive on time. 2. If you are having surgery at [facility] or [facility], you will need a copy of your medical history and physical form from your family physician within one month prior to the date of surgery. We will give you a form for your primary physician to complete.  3. We make every effort to accommodate the date you request for surgery.  However, there are times where surgery dates or times have to be moved.  We will contact you as soon as possible if a change in schedule is required.   4. No aspirin/ibuprofen for one week before surgery.  If you are on aspirin, any non-steroidal anti-inflammatory medications (Mobic, Aleve, Ibuprofen) should not be taken seven (7) days prior to your surgery.  You make take Tylenol for pain prior to surgery.  5. Medications - If you are taking daily heart and blood pressure medications, seizure, reflux, allergy, asthma, anxiety, pain or diabetes medications, make sure you notify the surgery center/hospital before the day of surgery so they can tell you which medications you should take or avoid the day of surgery. 6. No food or drink after midnight the night before surgery unless directed otherwise by surgical center/hospital staff. 7. No alcoholic beverages 24-hours prior to surgery.  No smoking 24-hours prior or 24-hours after  surgery. 8. Wear loose pants or shorts. They should be loose enough to fit over bandages, boots, and casts. 9. Don't wear slip-on shoes. Sneakers are preferred. 10. Bring your boot with you to the surgery center/hospital.  Also bring crutches or a walker if your physician has prescribed it for you.  If you do not have this equipment, it will be provided for you after surgery. 11. If you have not been contacted by the surgery center/hospital by the day before your surgery, call to confirm the date and time of your surgery. 12. Leave-time from work may vary depending on the type of surgery you have.  Appropriate arrangements should be made prior to surgery with your employer. 13. Prescriptions will be provided immediately following surgery by your doctor.  Fill these as soon as possible after surgery and take the medication as directed. Pain medications will not be refilled on weekends and must be approved by the doctor. 14. Remove nail polish on the operative foot and avoid getting pedicures prior to surgery. 15. Wash the night before surgery.  The night before surgery wash the foot and leg well with water and the antibacterial soap provided. Be sure to pay special attention to beneath the toenails and in between the toes.  Wash for at least three (3) minutes. Rinse thoroughly with water and dry well with a towel.  Perform this wash unless told not to do so by your physician.  Enclosed: 1 Ice pack (please put in freezer the night before surgery)   1 Hibiclens skin cleaner     Pre-op instructions  If you have any questions regarding the instructions, please do not hesitate to call our office.  [place]: [address], [place], [place] [address] -- [phone]  [place]: [address] [place], [place] [address] -- [phone]  [place]: [address]  [place], [place] [address] -- [phone]   Website: [email or web address] 

## 2019-03-19 NOTE — Progress Notes (Signed)
She presents today for follow-up of her MRI states that her left foot is still bothering her.  Objective: Vital signs are stable she is alert and oriented x3 pulses are palpable.  Neurologic sensorium is intact degenerative flexors are intact she still has pain on palpation and end range of motion of the subtalar joint and sinus tarsi of the left foot.   posterior tibial tendinitis of the right foot as well as plantar fasciitis chronic in nature.  MRI states that she has a chronic posterior tibial tendinitis with a nonosseous coalition of the calcaneonavicular joint and posterior tibial tendinitis without tears.  Assessment chronic plantar fasciitis posterior tibial tendinitis and sinus tarsitis.  Plan: Discussed etiology pathology conservative surgical therapies this point time consented her for surgery today for an endoscopic plantar fasciotomy a tenosynovectomy of the posterior tibial tendon and exploration of the sinus tarsi with possible interposition of the EDB and possible cast.  She understands this is amenable to it we will follow-up with me in the near future for surgical intervention.  We did discuss the possible postop complications which may include but not limited to postop pain bleeding swelling infection recurrence need for further surgery.

## 2019-04-03 DIAGNOSIS — G43719 Chronic migraine without aura, intractable, without status migrainosus: Secondary | ICD-10-CM | POA: Diagnosis not present

## 2019-04-03 DIAGNOSIS — G43019 Migraine without aura, intractable, without status migrainosus: Secondary | ICD-10-CM | POA: Diagnosis not present

## 2019-04-03 DIAGNOSIS — G43839 Menstrual migraine, intractable, without status migrainosus: Secondary | ICD-10-CM | POA: Diagnosis not present

## 2019-04-05 ENCOUNTER — Encounter: Payer: Self-pay | Admitting: *Deleted

## 2019-04-06 ENCOUNTER — Telehealth: Payer: Self-pay

## 2019-04-06 NOTE — Telephone Encounter (Signed)
LMTCB to schedule a surgical pre-op appointment.

## 2019-04-09 NOTE — Telephone Encounter (Signed)
Left message.  Please schedule a 40 minute appointment when she calls back.   Thanks,   -Mickel Baas

## 2019-04-11 ENCOUNTER — Telehealth: Payer: Self-pay | Admitting: Family Medicine

## 2019-04-11 NOTE — Telephone Encounter (Signed)
Pt calling to let Dr. B know she still has to see her new surgeon for her foot surgery. She will call back to make a pre op appt when she gets a date.  She has also vomited Mon and Wed morning.  The sick feeling comes over her and she vomits.  Pt is wanting a call back regarding this at [phone].  Thanks, [employer]

## 2019-04-11 NOTE — Progress Notes (Signed)
Once we get the clearance let me know, and I will contact the pt to get her scheduled for surgery.

## 2019-04-12 NOTE — Telephone Encounter (Signed)
Pt states she vomited only two times Monday and Wednesday she states she feels better today.  I advised her that if it happens again she will need to schedule a virtual visit with you.  She agreed.    Thanks,   -Mickel Baas

## 2019-04-12 NOTE — Telephone Encounter (Signed)
OK. As long as she knows that she needs pre-op before getting cleared for surgery.    As for the vomiting, still having this?  More details?

## 2019-04-13 DIAGNOSIS — M170 Bilateral primary osteoarthritis of knee: Secondary | ICD-10-CM | POA: Diagnosis not present
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
## 2019-04-22 NOTE — Discharge Instructions (Signed)
Instructions after Total Knee Replacement   Quincee Gittens P. Festus Pursel, Jr., M.D.     Dept. of Orthopaedics & Sports Medicine  [facility]  [address]  [place], [place]  [address]  Phone: [phone]   Fax: [phone]    DIET: Drink plenty of non-alcoholic fluids. Resume your normal diet. Include foods high in fiber.  ACTIVITY:  You may use crutches or a walker with weight-bearing as tolerated, unless instructed otherwise. You may be weaned off of the walker or crutches by your Physical Therapist.  Do NOT place pillows under the knee. Anything placed under the knee could limit your ability to straighten the knee.   Continue doing gentle exercises. Exercising will reduce the pain and swelling, increase motion, and prevent muscle weakness.   Please continue to use the TED compression stockings for 6 weeks. You may remove the stockings at night, but should reapply them in the morning. Do not drive or operate any equipment until instructed.  WOUND CARE:  Continue to use the PolarCare or ice packs periodically to reduce pain and swelling. You may bathe or shower after the staples are removed at the first office visit following surgery.  MEDICATIONS: You may resume your regular medications. Please take the pain medication as prescribed on the medication. Do not take pain medication on an empty stomach. You have been given a prescription for a blood thinner (Lovenox or Coumadin). Please take the medication as instructed. (NOTE: After completing a 2 week course of Lovenox, take one Enteric-coated aspirin once a day. This along with elevation will help reduce the possibility of phlebitis in your operated leg.) Do not drive or drink alcoholic beverages when taking pain medications.  CALL THE OFFICE FOR: Temperature above 101 degrees Excessive bleeding or drainage on the dressing. Excessive swelling, coldness, or paleness of the toes. Persistent nausea and vomiting.  FOLLOW-UP:  You  should have an appointment to return to the office in 10-14 days after surgery. Arrangements have been made for continuation of Physical Therapy (either home therapy or outpatient therapy).   [facility] Department Directory         [email or web address]       [email or web address]          Cardiology  Appointments: [place] - [phone] [place] - [phone]  Endocrinology  Appointments: [place] - [phone] [place] - [phone]  Gastroenterology  Appointments: [place] - [phone] [place] - [phone]        General Surgery   Appointments: [place] - [phone]  Internal Medicine/Family Medicine  Appointments: [place] - [phone] [place] - [phone] [place] - [phone]  Metabolic and Weigh Loss Surgery  Appointments: [place] - [phone]        Neurology  Appointments: [place] - [phone] [place] - [phone]  Neurosurgery  Appointments: [place] - [phone]  Obstetrics & Gynecology  Appointments: [place] - [phone] [place] - [phone]        Pediatrics  Appointments: [place] - [phone] [place] - [phone]  Physiatry  Appointments: [place] -[phone]  Physical Therapy  Appointments: [place] - [phone] [place] - [phone]        Podiatry  Appointments: [place] - [phone] [place] - [phone]  Pulmonology  Appointments: [place] - [phone]  Rheumatology  Appointments: [place] - [phone]        [place] Location: [facility]  [address] [place], [place]  [address]  [place] Location: [facility] [address] [place], [place]  [address]  [place] Location: [facility] [address] [place],   [address]    

## 2019-04-23 ENCOUNTER — Encounter
Admission: RE | Admit: 2019-04-23 | Discharge: 2019-04-23 | Disposition: A | Discharge status: Home / Self Care | Payer: BC Managed Care – PPO | Source: Ambulatory Visit | Attending: Orthopedic Surgery | Admitting: Orthopedic Surgery

## 2019-04-23 ENCOUNTER — Other Ambulatory Visit: Payer: Self-pay

## 2019-04-23 DIAGNOSIS — Z01818 Encounter for other preprocedural examination: Secondary | ICD-10-CM | POA: Insufficient documentation

## 2019-04-23 DIAGNOSIS — Z01810 Encounter for preprocedural cardiovascular examination: Secondary | ICD-10-CM | POA: Diagnosis not present

## 2019-04-23 HISTORY — DX: Sleep apnea, unspecified: G47.30

## 2019-04-23 HISTORY — DX: Prediabetes: R73.03

## 2019-04-23 HISTORY — DX: Localized edema: R60.0

## 2019-04-23 HISTORY — DX: Other complications of anesthesia, initial encounter: T88.59XA

## 2019-04-23 HISTORY — DX: Anxiety disorder, unspecified: F41.9

## 2019-04-23 HISTORY — DX: Gastro-esophageal reflux disease without esophagitis: K21.9

## 2019-04-23 HISTORY — DX: Depression, unspecified: F32.A

## 2019-04-23 LAB — URINALYSIS, ROUTINE W REFLEX MICROSCOPIC
Bilirubin Urine: NEGATIVE
Glucose, UA: NEGATIVE mg/dL
Hgb urine dipstick: NEGATIVE
Ketones, ur: NEGATIVE mg/dL
Leukocytes,Ua: NEGATIVE
Nitrite: NEGATIVE
Protein, ur: NEGATIVE mg/dL
Specific Gravity, Urine: 1.019 (ref 1.005–1.030)
pH: 6 (ref 5.0–8.0)

## 2019-04-23 LAB — COMPREHENSIVE METABOLIC PANEL
ALT: 14 U/L (ref 0–44)
AST: 24 U/L (ref 15–41)
Albumin: 3.8 g/dL (ref 3.5–5.0)
Alkaline Phosphatase: 49 U/L (ref 38–126)
Anion gap: 10 (ref 5–15)
BUN: 18 mg/dL (ref 6–20)
CO2: 30 mmol/L (ref 22–32)
Calcium: 9 mg/dL (ref 8.9–10.3)
Chloride: 100 mmol/L (ref 98–111)
Creatinine, Ser: 0.93 mg/dL (ref 0.44–1.00)
GFR calc Af Amer: >60 mL/min (ref >60)
GFR calc non Af Amer: >60 mL/min (ref >60)
Glucose, Bld: 102 mg/dL — ABNORMAL HIGH (ref 70–99)
Potassium: 3.5 mmol/L (ref 3.5–5.1)
Sodium: 140 mmol/L (ref 135–145)
Total Bilirubin: 0.9 mg/dL (ref 0.3–1.2)
Total Protein: 6.6 g/dL (ref 6.5–8.1)

## 2019-04-23 LAB — PROTIME-INR
INR: 1 (ref 0.8–1.2)
Prothrombin Time: 12.8 seconds (ref 11.4–15.2)

## 2019-04-23 LAB — SEDIMENTATION RATE: Sed Rate: 9 mm/hr (ref 0–30)

## 2019-04-23 LAB — TYPE AND SCREEN
ABO/RH(D): A POS
Antibody Screen: NEGATIVE

## 2019-04-23 LAB — CBC
HCT: 42.1 % (ref 36.0–46.0)
Hemoglobin: 14.1 g/dL (ref 12.0–15.0)
MCH: 31.5 pg (ref 26.0–34.0)
MCHC: 33.5 g/dL (ref 30.0–36.0)
MCV: 94 fL (ref 80.0–100.0)
Platelets: 246 10*3/uL (ref 150–400)
RBC: 4.48 MIL/uL (ref 3.87–5.11)
RDW: 12.1 % (ref 11.5–15.5)
WBC: 7.1 10*3/uL (ref 4.0–10.5)
nRBC: 0 % (ref 0.0–0.2)

## 2019-04-23 LAB — SURGICAL PCR SCREEN
MRSA, PCR: NEGATIVE
Staphylococcus aureus: NEGATIVE

## 2019-04-23 LAB — C-REACTIVE PROTEIN: CRP: 1.1 mg/dL — ABNORMAL HIGH (ref <1.0)

## 2019-04-23 LAB — APTT: aPTT: 33 seconds (ref 24–36)

## 2019-04-23 NOTE — Patient Instructions (Signed)
Your procedure is scheduled on: Wednesday 05/02/19 [facility] to [facility]. To find out your arrival time please call [phone] between 1PM - 3PM on Tuesday 05/01/19.  Remember: Instructions that are not followed completely may result in serious medical risk, up to and including death, or upon the discretion of your surgeon and anesthesiologist your surgery may need to be rescheduled.     _X__ 1. Do not eat food after midnight the night before your procedure.                 No gum chewing or hard candies. You may drink clear liquids up to 2 hours                 before you are scheduled to arrive for your surgery- DO not drink clear                 liquids within 2 hours of the start of your surgery.                 Clear Liquids include:  water, apple juice without pulp, clear carbohydrate                 drink such as Clearfast or Gatorade, Black Coffee or Tea (Do not add                 anything to coffee or tea). Diabetics water only DRINK ENSURE PRE SURGERY DRINK BY 4:30 AM IF YOU ARE SCHEDULED TO ARRIVE AT 6:00 AM OR WITHIN 2 HOURS OF ARRIVAL TIME.  __X__2.  On the morning of surgery brush your teeth with toothpaste and water, you                 may rinse your mouth with mouthwash if you wish.  Do not swallow any              toothpaste of mouthwash.     _X__ 3.  No Alcohol for 24 hours before or after surgery.   _X__ 4.  Do Not Smoke or use e-cigarettes For 24 Hours Prior to Your Surgery.                 Do not use any chewable tobacco products for at least 6 hours prior to                 surgery.  ____  5.  Bring all medications with you on the day of surgery if instructed.   __X__  6.  Notify your doctor if there is any change in your medical condition      (cold, fever, infections).     Do not wear jewelry, make-up, hairpins, clips or nail polish. Do not wear lotions, powders, or perfumes.  Do not shave 48 hours prior  to surgery. Men may shave face and neck. Do not bring valuables to the hospital.    [facility] is not responsible for any belongings or valuables.  Contacts, dentures/partials or body piercings may not be worn into surgery. Bring a case for your contacts, glasses or hearing aids, a denture cup will be supplied. Leave your suitcase in the car. After surgery it may be brought to your room. For patients admitted to the hospital, discharge time is determined by your treatment team.   Patients discharged the day of surgery will not be allowed to drive home.   Please read over the following  fact sheets that you were given:   MRSA Information  __X__ Take these medicines the morning of surgery with A SIP OF WATER:    1. buPROPion (WELLBUTRIN)  2. escitalopram (LEXAPRO  3. gabapentin (NEURONTIN  4. omeprazole (PRILOSEC  5.  6.  ____ Fleet Enema (as directed)   __X__ Use CHG Soap/SAGE wipes as directed  __X__ Use inhalers on the day of surgery  ____ Stop metformin/Janumet/Farxiga 2 days prior to surgery    ____ Take 1/2 of usual insulin dose the night before surgery. No insulin the morning          of surgery.   ____ Stop Blood Thinners Coumadin/Plavix/Xarelto/Pleta/Pradaxa/Eliquis/Effient/Aspirin  on   Or contact your Surgeon, Cardiologist or Medical Doctor regarding  ability to stop your blood thinners  __X__ Stop Anti-inflammatories 7 days before surgery such as Advil, Ibuprofen, Motrin,  BC or Goodies Powder, Naprosyn, Naproxen, Aleve, Aspirin    __X__ Stop all herbal supplements, fish oil or vitamin E until after surgery.    ____ Bring C-Pap to the hospital.

## 2019-04-23 NOTE — Care Management (Signed)
EKG reviewed. Poor R wave progression, otherwise ok. Proceed.

## 2019-04-24 LAB — URINE CULTURE
Culture: NO GROWTH
Special Requests: NORMAL

## 2019-04-25 LAB — IGE: IgE (Immunoglobulin E), Serum: 4 IU/mL — ABNORMAL LOW (ref 6–495)

## 2019-04-27 ENCOUNTER — Other Ambulatory Visit: Payer: Self-pay

## 2019-04-27 ENCOUNTER — Telehealth: Payer: Self-pay | Admitting: *Deleted

## 2019-04-27 ENCOUNTER — Other Ambulatory Visit
Admission: RE | Admit: 2019-04-27 | Discharge: 2019-04-27 | Disposition: A | Discharge status: Home / Self Care | Payer: BC Managed Care – PPO | Source: Ambulatory Visit | Attending: Orthopedic Surgery | Admitting: Orthopedic Surgery

## 2019-04-27 DIAGNOSIS — Z01812 Encounter for preprocedural laboratory examination: Secondary | ICD-10-CM | POA: Insufficient documentation

## 2019-04-27 DIAGNOSIS — Z20828 Contact with and (suspected) exposure to other viral communicable diseases: Secondary | ICD-10-CM | POA: Diagnosis not present

## 2019-04-27 LAB — SARS CORONAVIRUS 2 (TAT 6-24 HRS): SARS Coronavirus 2: NEGATIVE

## 2019-04-27 NOTE — Telephone Encounter (Signed)
Patient called office requesting someone call her

## 2019-04-30 ENCOUNTER — Telehealth: Payer: Self-pay | Admitting: Podiatry

## 2019-04-30 NOTE — Telephone Encounter (Signed)
Left voicemail letting pt know I could get her scheduled for the afternoon of Thursday  17 December of Friday 18 December. If either of those dates don't work, we would be looking at scheduling for next year. Asked pt to call me back directly.

## 2019-04-30 NOTE — Telephone Encounter (Signed)
Dr. Milinda Pointer was waiting for me to have my knee surgery which is scheduled for  11 November. He said I should be all set for my foot and ankle surgery by the end of the year. I'm hoping to get that surgery scheduled for the last week in December if at all possible.

## 2019-05-01 MED ORDER — CLINDAMYCIN PHOSPHATE 900 MG/50ML IV SOLN: 900.0000 mg | INTRAVENOUS | Status: DC

## 2019-05-01 MED ORDER — TRANEXAMIC ACID-NACL 1000-0.7 MG/100ML-% IV SOLN
1000.0000 mg | INTRAVENOUS | Status: DC
  Filled 2019-05-01: qty 100

## 2019-05-02 ENCOUNTER — Inpatient Hospital Stay
Admission: RE | Admit: 2019-05-02 | Discharge: 2019-05-04 | DRG: 470 | Disposition: A | Discharge status: Home Health | Payer: BC Managed Care – PPO | Attending: Orthopedic Surgery | Admitting: Orthopedic Surgery

## 2019-05-02 ENCOUNTER — Encounter
Admission: RE | Disposition: A | Discharge status: Home Health | Payer: Self-pay | Source: Ambulatory Visit | Attending: Orthopedic Surgery

## 2019-05-02 ENCOUNTER — Inpatient Hospital Stay: Payer: BC Managed Care – PPO

## 2019-05-02 ENCOUNTER — Inpatient Hospital Stay: Payer: BC Managed Care – PPO | Admitting: Anesthesiology

## 2019-05-02 ENCOUNTER — Other Ambulatory Visit: Payer: Self-pay

## 2019-05-02 ENCOUNTER — Encounter: Payer: Self-pay | Admitting: Orthopedic Surgery

## 2019-05-02 DIAGNOSIS — I10 Essential (primary) hypertension: Secondary | ICD-10-CM | POA: Diagnosis present

## 2019-05-02 DIAGNOSIS — Z87891 Personal history of nicotine dependence: Secondary | ICD-10-CM | POA: Diagnosis not present

## 2019-05-02 DIAGNOSIS — Z6841 Body Mass Index (BMI) 40.0 and over, adult: Secondary | ICD-10-CM | POA: Diagnosis not present

## 2019-05-02 DIAGNOSIS — Z471 Aftercare following joint replacement surgery: Secondary | ICD-10-CM | POA: Diagnosis not present

## 2019-05-02 DIAGNOSIS — K219 Gastro-esophageal reflux disease without esophagitis: Secondary | ICD-10-CM | POA: Diagnosis not present

## 2019-05-02 DIAGNOSIS — F419 Anxiety disorder, unspecified: Secondary | ICD-10-CM | POA: Diagnosis not present

## 2019-05-02 DIAGNOSIS — R7303 Prediabetes: Secondary | ICD-10-CM | POA: Diagnosis not present

## 2019-05-02 DIAGNOSIS — F329 Major depressive disorder, single episode, unspecified: Secondary | ICD-10-CM | POA: Diagnosis not present

## 2019-05-02 DIAGNOSIS — G4730 Sleep apnea, unspecified: Secondary | ICD-10-CM | POA: Diagnosis present

## 2019-05-02 DIAGNOSIS — G43909 Migraine, unspecified, not intractable, without status migrainosus: Secondary | ICD-10-CM | POA: Diagnosis present

## 2019-05-02 DIAGNOSIS — E7800 Pure hypercholesterolemia, unspecified: Secondary | ICD-10-CM | POA: Diagnosis not present

## 2019-05-02 DIAGNOSIS — Z23 Encounter for immunization: Secondary | ICD-10-CM

## 2019-05-02 DIAGNOSIS — M170 Bilateral primary osteoarthritis of knee: Principal | ICD-10-CM | POA: Diagnosis present

## 2019-05-02 DIAGNOSIS — Z96651 Presence of right artificial knee joint: Secondary | ICD-10-CM | POA: Diagnosis not present

## 2019-05-02 DIAGNOSIS — M1711 Unilateral primary osteoarthritis, right knee: Secondary | ICD-10-CM | POA: Diagnosis not present

## 2019-05-02 DIAGNOSIS — Z96659 Presence of unspecified artificial knee joint: Secondary | ICD-10-CM

## 2019-05-02 DIAGNOSIS — E876 Hypokalemia: Secondary | ICD-10-CM | POA: Diagnosis not present

## 2019-05-02 HISTORY — PX: KNEE ARTHROPLASTY: SHX992

## 2019-05-02 LAB — ABO/RH: ABO/RH(D): A POS

## 2019-05-02 SURGERY — ARTHROPLASTY, KNEE, TOTAL, USING IMAGELESS COMPUTER-ASSISTED NAVIGATION
Anesthesia: Spinal | Site: Knee | Laterality: Right

## 2019-05-02 MED ORDER — TRANEXAMIC ACID-NACL 1000-0.7 MG/100ML-% IV SOLN
INTRAVENOUS | Status: DC | PRN
  Administered 2019-05-02: 1000 mg via INTRAVENOUS

## 2019-05-02 MED ORDER — TOPIRAMATE ER 150 MG PO SPRINKLE CAP24
150.0000 mg | EXTENDED_RELEASE_CAPSULE | Freq: Every day | ORAL | Status: DC
  Administered 2019-05-02 – 2019-05-03 (×2): 150 mg via ORAL
  Filled 2019-05-02 (×2): qty 1

## 2019-05-02 MED ORDER — ALUM & MAG HYDROXIDE-SIMETH 200-200-20 MG/5ML PO SUSP: 30.0000 mL | ORAL | Status: DC | PRN

## 2019-05-02 MED ORDER — ONDANSETRON HCL 4 MG/2ML IJ SOLN: 4.0000 mg | Freq: Four times a day (QID) | INTRAMUSCULAR | Status: DC | PRN

## 2019-05-02 MED ORDER — CELECOXIB 200 MG PO CAPS
ORAL_CAPSULE | ORAL | Status: AC
  Administered 2019-05-02: 400 mg
  Filled 2019-05-02: qty 2

## 2019-05-02 MED ORDER — METOCLOPRAMIDE HCL 5 MG/ML IJ SOLN: 5.0000 mg | Freq: Three times a day (TID) | INTRAMUSCULAR | Status: DC | PRN

## 2019-05-02 MED ORDER — ACETAMINOPHEN 10 MG/ML IV SOLN
1000.0000 mg | Freq: Four times a day (QID) | INTRAVENOUS | Status: AC
  Administered 2019-05-02 – 2019-05-03 (×4): 1000 mg via INTRAVENOUS
  Filled 2019-05-02 (×4): qty 100

## 2019-05-02 MED ORDER — CLINDAMYCIN PHOSPHATE 900 MG/50ML IV SOLN
INTRAVENOUS | Status: DC | PRN
  Administered 2019-05-02: 900 mg via INTRAVENOUS

## 2019-05-02 MED ORDER — FLEET ENEMA 7-19 GM/118ML RE ENEM: 1.0000 | ENEMA | Freq: Once | RECTAL | Status: DC | PRN

## 2019-05-02 MED ORDER — BUPIVACAINE HCL (PF) 0.5 % IJ SOLN
INTRAMUSCULAR | Status: DC | PRN
  Administered 2019-05-02: 2.75 mL

## 2019-05-02 MED ORDER — METOCLOPRAMIDE HCL 10 MG PO TABS: 5.0000 mg | ORAL_TABLET | Freq: Three times a day (TID) | ORAL | Status: DC | PRN

## 2019-05-02 MED ORDER — DEXAMETHASONE SODIUM PHOSPHATE 10 MG/ML IJ SOLN
INTRAMUSCULAR | Status: AC
  Administered 2019-05-02: 8 mg via INTRAVENOUS
  Filled 2019-05-02: qty 1

## 2019-05-02 MED ORDER — RIZATRIPTAN BENZOATE 10 MG PO TABS: 10.0000 mg | ORAL_TABLET | Freq: Once | ORAL | Status: DC | PRN

## 2019-05-02 MED ORDER — ACETAMINOPHEN 10 MG/ML IV SOLN
INTRAVENOUS | Status: DC | PRN
  Administered 2019-05-02: 1000 mg via INTRAVENOUS

## 2019-05-02 MED ORDER — TRAMADOL HCL 50 MG PO TABS
50.0000 mg | ORAL_TABLET | ORAL | Status: DC | PRN
  Administered 2019-05-02 – 2019-05-03 (×2): 100 mg via ORAL
  Filled 2019-05-02 (×2): qty 2

## 2019-05-02 MED ORDER — CELECOXIB 200 MG PO CAPS
200.0000 mg | ORAL_CAPSULE | Freq: Two times a day (BID) | ORAL | Status: DC
  Administered 2019-05-02 – 2019-05-03 (×3): 200 mg via ORAL
  Filled 2019-05-02 (×3): qty 1

## 2019-05-02 MED ORDER — OXYCODONE HCL 5 MG PO TABS
5.0000 mg | ORAL_TABLET | ORAL | Status: DC | PRN
  Administered 2019-05-03 – 2019-05-04 (×3): 5 mg via ORAL
  Filled 2019-05-02 (×3): qty 1

## 2019-05-02 MED ORDER — PROPOFOL 10 MG/ML IV BOLUS
INTRAVENOUS | Status: DC | PRN
  Administered 2019-05-02 (×2): 20 mg via INTRAVENOUS

## 2019-05-02 MED ORDER — ASPIRIN-ACETAMINOPHEN-CAFFEINE 250-250-65 MG PO TABS
2.0000 | ORAL_TABLET | Freq: Three times a day (TID) | ORAL | Status: DC | PRN
  Filled 2019-05-02: qty 2

## 2019-05-02 MED ORDER — ACETAMINOPHEN 10 MG/ML IV SOLN
INTRAVENOUS | Status: AC
  Filled 2019-05-02: qty 100

## 2019-05-02 MED ORDER — ENOXAPARIN SODIUM 30 MG/0.3ML ~~LOC~~ SOLN
30.0000 mg | Freq: Two times a day (BID) | SUBCUTANEOUS | Status: DC
  Administered 2019-05-03 – 2019-05-04 (×3): 30 mg via SUBCUTANEOUS
  Filled 2019-05-02 (×4): qty 0.3

## 2019-05-02 MED ORDER — MAGNESIUM HYDROXIDE 400 MG/5ML PO SUSP
30.0000 mL | Freq: Every day | ORAL | Status: DC
  Administered 2019-05-03 – 2019-05-04 (×2): 30 mL via ORAL
  Filled 2019-05-02 (×2): qty 30

## 2019-05-02 MED ORDER — PANTOPRAZOLE SODIUM 40 MG PO TBEC
40.0000 mg | DELAYED_RELEASE_TABLET | Freq: Two times a day (BID) | ORAL | Status: DC
  Administered 2019-05-02 – 2019-05-04 (×4): 40 mg via ORAL
  Filled 2019-05-02 (×4): qty 1

## 2019-05-02 MED ORDER — BISACODYL 10 MG RE SUPP
10.0000 mg | Freq: Every day | RECTAL | Status: DC | PRN
  Administered 2019-05-04: 10 mg via RECTAL
  Filled 2019-05-02: qty 1

## 2019-05-02 MED ORDER — BACLOFEN 10 MG PO TABS
10.0000 mg | ORAL_TABLET | Freq: Two times a day (BID) | ORAL | Status: DC | PRN
  Filled 2019-05-02: qty 1

## 2019-05-02 MED ORDER — ONDANSETRON HCL 4 MG/2ML IJ SOLN
INTRAMUSCULAR | Status: AC
  Filled 2019-05-02: qty 2

## 2019-05-02 MED ORDER — PROPOFOL 500 MG/50ML IV EMUL
INTRAVENOUS | Status: AC
  Filled 2019-05-02: qty 100

## 2019-05-02 MED ORDER — PHENOL 1.4 % MT LIQD
1.0000 | OROMUCOSAL | Status: DC | PRN
  Filled 2019-05-02: qty 177

## 2019-05-02 MED ORDER — GABAPENTIN 300 MG PO CAPS: 300.0000 mg | ORAL_CAPSULE | Freq: Once | ORAL | Status: DC

## 2019-05-02 MED ORDER — SENNOSIDES-DOCUSATE SODIUM 8.6-50 MG PO TABS
1.0000 | ORAL_TABLET | Freq: Two times a day (BID) | ORAL | Status: DC
  Administered 2019-05-02 – 2019-05-04 (×4): 1 via ORAL
  Filled 2019-05-02 (×4): qty 1

## 2019-05-02 MED ORDER — NEOMYCIN-POLYMYXIN B GU 40-200000 IR SOLN
Status: DC | PRN
  Administered 2019-05-02: 14 mL

## 2019-05-02 MED ORDER — AZELASTINE HCL 0.1 % NA SOLN
1.0000 | Freq: Two times a day (BID) | NASAL | Status: DC | PRN
  Filled 2019-05-02: qty 30

## 2019-05-02 MED ORDER — POTASSIUM CHLORIDE 20 MEQ/15ML (10%) PO SOLN
40.0000 meq | Freq: Every day | ORAL | Status: DC
  Administered 2019-05-02 – 2019-05-04 (×3): 40 meq via ORAL
  Filled 2019-05-02 (×3): qty 30

## 2019-05-02 MED ORDER — PHENYLEPHRINE HCL (PRESSORS) 10 MG/ML IV SOLN
INTRAVENOUS | Status: DC | PRN
  Administered 2019-05-02 (×6): 100 ug via INTRAVENOUS

## 2019-05-02 MED ORDER — ESCITALOPRAM OXALATE 10 MG PO TABS
20.0000 mg | ORAL_TABLET | Freq: Every day | ORAL | Status: DC
  Administered 2019-05-03 – 2019-05-04 (×2): 20 mg via ORAL
  Filled 2019-05-02 (×2): qty 2

## 2019-05-02 MED ORDER — SODIUM CHLORIDE 0.9 % IV SOLN
INTRAVENOUS | Status: DC | PRN
  Administered 2019-05-02: 60 mL

## 2019-05-02 MED ORDER — ALBUTEROL SULFATE (2.5 MG/3ML) 0.083% IN NEBU: 2.5000 mg | INHALATION_SOLUTION | Freq: Four times a day (QID) | RESPIRATORY_TRACT | Status: DC | PRN

## 2019-05-02 MED ORDER — PROPOFOL 10 MG/ML IV BOLUS
INTRAVENOUS | Status: AC
  Filled 2019-05-02: qty 20

## 2019-05-02 MED ORDER — PROPOFOL 500 MG/50ML IV EMUL
INTRAVENOUS | Status: AC
  Filled 2019-05-02: qty 50

## 2019-05-02 MED ORDER — METOCLOPRAMIDE HCL 10 MG PO TABS
10.0000 mg | ORAL_TABLET | Freq: Three times a day (TID) | ORAL | Status: DC
  Administered 2019-05-02 – 2019-05-04 (×6): 10 mg via ORAL
  Filled 2019-05-02 (×6): qty 1

## 2019-05-02 MED ORDER — TRANEXAMIC ACID-NACL 1000-0.7 MG/100ML-% IV SOLN
1000.0000 mg | Freq: Once | INTRAVENOUS | Status: AC
  Administered 2019-05-02: 1000 mg via INTRAVENOUS
  Filled 2019-05-02: qty 100

## 2019-05-02 MED ORDER — OXYCODONE HCL 5 MG PO TABS: 10.0000 mg | ORAL_TABLET | ORAL | Status: DC | PRN

## 2019-05-02 MED ORDER — ADULT MULTIVITAMIN W/MINERALS CH
1.0000 | ORAL_TABLET | Freq: Every day | ORAL | Status: DC
  Administered 2019-05-02 – 2019-05-04 (×3): 1 via ORAL
  Filled 2019-05-02 (×3): qty 1

## 2019-05-02 MED ORDER — INFLUENZA VAC SPLIT QUAD 0.5 ML IM SUSY
0.5000 mL | PREFILLED_SYRINGE | INTRAMUSCULAR | Status: AC
  Administered 2019-05-03: 0.5 mL via INTRAMUSCULAR
  Filled 2019-05-02: qty 0.5

## 2019-05-02 MED ORDER — DOXYCYCLINE HYCLATE 100 MG PO TABS
100.0000 mg | ORAL_TABLET | Freq: Every day | ORAL | Status: DC
  Filled 2019-05-02 (×2): qty 1

## 2019-05-02 MED ORDER — HYDROCHLOROTHIAZIDE 12.5 MG PO CAPS
12.5000 mg | ORAL_CAPSULE | Freq: Every day | ORAL | Status: DC
  Administered 2019-05-03: 12.5 mg via ORAL
  Filled 2019-05-02: qty 1

## 2019-05-02 MED ORDER — TETRACAINE HCL 1 % IJ SOLN
INTRAMUSCULAR | Status: DC | PRN
  Administered 2019-05-02: 2 mg via INTRASPINAL

## 2019-05-02 MED ORDER — TORSEMIDE 20 MG PO TABS
40.0000 mg | ORAL_TABLET | Freq: Every day | ORAL | Status: DC
  Administered 2019-05-02 – 2019-05-03 (×2): 40 mg via ORAL
  Filled 2019-05-02 (×2): qty 2

## 2019-05-02 MED ORDER — ONDANSETRON HCL 4 MG/2ML IJ SOLN: 4.0000 mg | Freq: Once | INTRAMUSCULAR | Status: DC | PRN

## 2019-05-02 MED ORDER — ENSURE PRE-SURGERY PO LIQD
296.0000 mL | Freq: Once | ORAL | Status: DC
  Filled 2019-05-02: qty 296

## 2019-05-02 MED ORDER — DIPHENHYDRAMINE HCL 12.5 MG/5ML PO ELIX: 12.5000 mg | ORAL_SOLUTION | ORAL | Status: DC | PRN

## 2019-05-02 MED ORDER — LACTATED RINGERS IV SOLN
INTRAVENOUS | Status: DC
  Administered 2019-05-02 (×2): via INTRAVENOUS

## 2019-05-02 MED ORDER — MIDAZOLAM HCL 2 MG/2ML IJ SOLN
INTRAMUSCULAR | Status: AC
  Filled 2019-05-02: qty 2

## 2019-05-02 MED ORDER — BUPIVACAINE HCL (PF) 0.25 % IJ SOLN
INTRAMUSCULAR | Status: DC | PRN
  Administered 2019-05-02: 60 mL

## 2019-05-02 MED ORDER — DEXAMETHASONE SODIUM PHOSPHATE 10 MG/ML IJ SOLN
8.0000 mg | Freq: Once | INTRAMUSCULAR | Status: AC
  Administered 2019-05-02: 11:00:00 8 mg via INTRAVENOUS

## 2019-05-02 MED ORDER — SODIUM CHLORIDE 0.9 % IV SOLN
INTRAVENOUS | Status: DC
  Administered 2019-05-02 – 2019-05-03 (×2): via INTRAVENOUS

## 2019-05-02 MED ORDER — BUPROPION HCL 100 MG PO TABS
100.0000 mg | ORAL_TABLET | Freq: Two times a day (BID) | ORAL | Status: DC
  Administered 2019-05-03 – 2019-05-04 (×3): 100 mg via ORAL
  Filled 2019-05-02 (×5): qty 1

## 2019-05-02 MED ORDER — FERROUS SULFATE 325 (65 FE) MG PO TABS
325.0000 mg | ORAL_TABLET | Freq: Two times a day (BID) | ORAL | Status: DC
  Administered 2019-05-03 – 2019-05-04 (×3): 325 mg via ORAL
  Filled 2019-05-02 (×3): qty 1

## 2019-05-02 MED ORDER — MIDAZOLAM HCL 5 MG/5ML IJ SOLN
INTRAMUSCULAR | Status: DC | PRN
  Administered 2019-05-02: 2 mg via INTRAVENOUS

## 2019-05-02 MED ORDER — CHLORHEXIDINE GLUCONATE 4 % EX LIQD: 60.0000 mL | Freq: Once | CUTANEOUS | Status: DC

## 2019-05-02 MED ORDER — HYDROMORPHONE HCL 1 MG/ML IJ SOLN: 0.5000 mg | INTRAMUSCULAR | Status: DC | PRN

## 2019-05-02 MED ORDER — PROPOFOL 500 MG/50ML IV EMUL
INTRAVENOUS | Status: DC | PRN
  Administered 2019-05-02: 100 ug/kg/min via INTRAVENOUS

## 2019-05-02 MED ORDER — ONDANSETRON HCL 4 MG PO TABS: 4.0000 mg | ORAL_TABLET | Freq: Four times a day (QID) | ORAL | Status: DC | PRN

## 2019-05-02 MED ORDER — MENTHOL 3 MG MT LOZG
1.0000 | LOZENGE | OROMUCOSAL | Status: DC | PRN
  Filled 2019-05-02: qty 9

## 2019-05-02 MED ORDER — CLINDAMYCIN PHOSPHATE 900 MG/50ML IV SOLN
INTRAVENOUS | Status: AC
  Filled 2019-05-02: qty 50

## 2019-05-02 MED ORDER — CLINDAMYCIN PHOSPHATE 600 MG/50ML IV SOLN
600.0000 mg | Freq: Four times a day (QID) | INTRAVENOUS | Status: AC
  Administered 2019-05-02 – 2019-05-03 (×4): 600 mg via INTRAVENOUS
  Filled 2019-05-02 (×5): qty 50

## 2019-05-02 MED ORDER — GABAPENTIN 300 MG PO CAPS
600.0000 mg | ORAL_CAPSULE | Freq: Two times a day (BID) | ORAL | Status: DC
  Administered 2019-05-02 – 2019-05-04 (×4): 600 mg via ORAL
  Filled 2019-05-02 (×4): qty 2

## 2019-05-02 MED ORDER — FENTANYL CITRATE (PF) 100 MCG/2ML IJ SOLN
25.0000 ug | INTRAMUSCULAR | Status: DC | PRN
  Administered 2019-05-02 (×2): 25 ug via INTRAVENOUS

## 2019-05-02 MED ORDER — FENTANYL CITRATE (PF) 100 MCG/2ML IJ SOLN
INTRAMUSCULAR | Status: AC
  Administered 2019-05-02: 25 ug via INTRAVENOUS
  Filled 2019-05-02: qty 2

## 2019-05-02 MED ORDER — SUMATRIPTAN SUCCINATE 50 MG PO TABS: 50.0000 mg | ORAL_TABLET | ORAL | Status: DC | PRN

## 2019-05-02 MED ORDER — ACETAMINOPHEN 325 MG PO TABS: 325.0000 mg | ORAL_TABLET | Freq: Four times a day (QID) | ORAL | Status: DC | PRN

## 2019-05-02 SURGICAL SUPPLY — 77 items
ATTUNE MED DOME PAT 38 KNEE (Knees) ×1 IMPLANT
ATTUNE PS FEM RT SZ 5 CEM KNEE (Femur) ×1 IMPLANT
ATTUNE PSRP INSE SZ5 7 KNEE (Insert) ×1 IMPLANT
BASE TIBIAL ROT PLAT SZ 5 KNEE (Knees) IMPLANT
BATTERY INSTRU NAVIGATION (MISCELLANEOUS) ×8 IMPLANT
BLADE SAW 70X12.5 (BLADE) ×2 IMPLANT
BLADE SAW 90X13X1.19 OSCILLAT (BLADE) ×2 IMPLANT
BLADE SAW 90X25X1.19 OSCILLAT (BLADE) ×2 IMPLANT
BONE CEMENT GENTAMICIN (Cement) ×4 IMPLANT
CANISTER SUCT 3000ML PPV (MISCELLANEOUS) ×2 IMPLANT
CEMENT BONE GENTAMICIN 40 (Cement) IMPLANT
COOLER ICEMAN CLASSIC (MISCELLANEOUS) ×2 IMPLANT
COVER WAND RF STERILE (DRAPES) ×2 IMPLANT
CUFF TOURN SGL QUICK 24 (TOURNIQUET CUFF)
CUFF TOURN SGL QUICK 30 (TOURNIQUET CUFF)
CUFF TOURN SGL QUICK 34 (TOURNIQUET CUFF) ×1
CUFF TRNQT CYL 24X4X16.5-23 (TOURNIQUET CUFF) IMPLANT
CUFF TRNQT CYL 30X4X21-28X (TOURNIQUET CUFF) IMPLANT
CUFF TRNQT CYL 34X4.125X (TOURNIQUET CUFF) IMPLANT
DRAPE 3/4 80X56 (DRAPES) ×2 IMPLANT
DRSG DERMACEA 8X12 NADH (GAUZE/BANDAGES/DRESSINGS) ×2 IMPLANT
DRSG OPSITE POSTOP 4X14 (GAUZE/BANDAGES/DRESSINGS) ×2 IMPLANT
DRSG TEGADERM 4X4.75 (GAUZE/BANDAGES/DRESSINGS) ×2 IMPLANT
DURAPREP 26ML APPLICATOR (WOUND CARE) ×4 IMPLANT
ELECT REM PT RETURN 9FT ADLT (ELECTROSURGICAL) ×2
ELECTRODE REM PT RTRN 9FT ADLT (ELECTROSURGICAL) ×1 IMPLANT
EX-PIN ORTHOLOCK NAV 4X150 (PIN) ×4 IMPLANT
GLOVE BIO SURGEON STRL SZ7.5 (GLOVE) ×4 IMPLANT
GLOVE BIOGEL M STRL SZ7.5 (GLOVE) ×4 IMPLANT
GLOVE BIOGEL PI IND STRL 7.5 (GLOVE) ×1 IMPLANT
GLOVE BIOGEL PI INDICATOR 7.5 (GLOVE) ×1
GLOVE INDICATOR 8.0 STRL GRN (GLOVE) ×2 IMPLANT
GOWN STRL REUS W/ TWL LRG LVL3 (GOWN DISPOSABLE) ×2 IMPLANT
GOWN STRL REUS W/ TWL XL LVL3 (GOWN DISPOSABLE) ×1 IMPLANT
GOWN STRL REUS W/TWL LRG LVL3 (GOWN DISPOSABLE) ×2
GOWN STRL REUS W/TWL XL LVL3 (GOWN DISPOSABLE) ×1
HEMOVAC 400CC 10FR (MISCELLANEOUS) ×2 IMPLANT
HOLDER FOLEY CATH W/STRAP (MISCELLANEOUS) ×2 IMPLANT
HOOD PEEL AWAY FLYTE STAYCOOL (MISCELLANEOUS) ×4 IMPLANT
KIT TURNOVER KIT A (KITS) ×2 IMPLANT
KNIFE SCULPS 14X20 (INSTRUMENTS) ×2 IMPLANT
LABEL OR SOLS (LABEL) ×2 IMPLANT
MANIFOLD NEPTUNE II (INSTRUMENTS) ×2 IMPLANT
NDL SAFETY ECLIPSE 18X1.5 (NEEDLE) ×1 IMPLANT
NDL SPNL 20GX3.5 QUINCKE YW (NEEDLE) ×2 IMPLANT
NEEDLE HYPO 18GX1.5 SHARP (NEEDLE) ×1
NEEDLE SPNL 20GX3.5 QUINCKE YW (NEEDLE) ×4 IMPLANT
NS IRRIG 500ML POUR BTL (IV SOLUTION) ×2 IMPLANT
PACK TOTAL KNEE (MISCELLANEOUS) ×2 IMPLANT
PAD COLD SHLDR UNI WRAP-ON (PAD) ×2
PAD COLD UNI WRAP-ON (PAD) IMPLANT
PAD WRAPON POLAR KNEE (MISCELLANEOUS) ×1 IMPLANT
PENCIL SMOKE ULTRAEVAC 22 CON (MISCELLANEOUS) ×2 IMPLANT
PIN DRILL QUICK PACK ×2 IMPLANT
PIN FIXATION 1/8DIA X 3INL (PIN) ×6 IMPLANT
PULSAVAC PLUS IRRIG FAN TIP (DISPOSABLE) ×2
SOL .9 NS 3000ML IRR  AL (IV SOLUTION) ×1
SOL .9 NS 3000ML IRR UROMATIC (IV SOLUTION) ×1 IMPLANT
SOL PREP PVP 2OZ (MISCELLANEOUS) ×2
SOLUTION PREP PVP 2OZ (MISCELLANEOUS) ×1 IMPLANT
SPONGE DRAIN TRACH 4X4 STRL 2S (GAUZE/BANDAGES/DRESSINGS) ×2 IMPLANT
STAPLER SKIN PROX 35W (STAPLE) ×2 IMPLANT
STOCKINETTE IMPERV 14X48 (MISCELLANEOUS) ×1 IMPLANT
STRAP TIBIA SHORT (MISCELLANEOUS) ×2 IMPLANT
SUCTION FRAZIER HANDLE 10FR (MISCELLANEOUS) ×1
SUCTION TUBE FRAZIER 10FR DISP (MISCELLANEOUS) ×1 IMPLANT
SUT VIC AB 0 CT1 36 (SUTURE) ×3 IMPLANT
SUT VIC AB 1 CT1 36 (SUTURE) ×4 IMPLANT
SUT VIC AB 2-0 CT2 27 (SUTURE) ×2 IMPLANT
SYR 20ML LL LF (SYRINGE) ×2 IMPLANT
SYR 30ML LL (SYRINGE) ×4 IMPLANT
TIBIAL BASE ROT PLAT SZ 5 KNEE (Knees) ×2 IMPLANT
TIP FAN IRRIG PULSAVAC PLUS (DISPOSABLE) ×1 IMPLANT
TOWEL OR 17X26 4PK STRL BLUE (TOWEL DISPOSABLE) ×2 IMPLANT
TOWER CARTRIDGE SMART MIX (DISPOSABLE) ×2 IMPLANT
TRAY FOLEY MTR SLVR 16FR STAT (SET/KITS/TRAYS/PACK) ×2 IMPLANT
WRAPON POLAR PAD KNEE (MISCELLANEOUS)

## 2019-05-02 NOTE — Anesthesia Post-op Follow-up Note (Signed)
Anesthesia QCDR form completed.        

## 2019-05-02 NOTE — H&P (Signed)
The patient has been re-examined, and the chart reviewed, and there have been no interval changes to the documented history and physical.    The risks, benefits, and alternatives have been discussed at length. The patient expressed understanding of the risks benefits and agreed with plans for surgical intervention.  James P. Hooten, Jr. M.D.    

## 2019-05-02 NOTE — Anesthesia Preprocedure Evaluation (Addendum)
Anesthesia Evaluation  Patient identified by MRN, date of birth, ID band Patient awake    Reviewed: Allergy & Precautions, NPO status , Patient's Chart, lab work & pertinent test results  History of Anesthesia Complications (+) history of anesthetic complications  Airway Mallampati: III  TM Distance: <3 FB     Dental  (+) Missing   Pulmonary asthma , sleep apnea , Patient abstained from smoking., former smoker,    Pulmonary exam normal        Cardiovascular hypertension, Normal cardiovascular exam     Neuro/Psych  Headaches, PSYCHIATRIC DISORDERS Anxiety Depression    GI/Hepatic Neg liver ROS, GERD  ,  Endo/Other  Morbid obesity  Renal/GU negative Renal ROS  negative genitourinary   Musculoskeletal   Abdominal Normal abdominal exam  (+)   Peds negative pediatric ROS (+)  Hematology negative hematology ROS (+)   Anesthesia Other Findings Past Medical History: No date: Anxiety No date: Complication of anesthesia     Comment:  CAUSES MIGRAINE No date: Depression No date: Edema of both lower extremities No date: GERD (gastroesophageal reflux disease) No date: Migraines No date: Pre-diabetes No date: Sleep apnea  Reproductive/Obstetrics                           Anesthesia Physical Anesthesia Plan  ASA: III  Anesthesia Plan: Spinal   Post-op Pain Management:    Induction:   PONV Risk Score and Plan:   Airway Management Planned: Nasal Cannula  Additional Equipment:   Intra-op Plan:   Post-operative Plan:   Informed Consent: I have reviewed the patients History and Physical, chart, labs and discussed the procedure including the risks, benefits and alternatives for the proposed anesthesia with the patient or authorized representative who has indicated his/her understanding and acceptance.     Dental advisory given  Plan Discussed with: CRNA and Surgeon  Anesthesia Plan  Comments:        Anesthesia Quick Evaluation

## 2019-05-02 NOTE — Op Note (Signed)
OPERATIVE NOTE  DATE OF SURGERY:  05/02/2019  PATIENT NAME:  ROVENIA MINERVINI   DOB: 1961-03-28  MRN: [id number]  PRE-OPERATIVE DIAGNOSIS: Degenerative arthrosis of the right knee, primary  POST-OPERATIVE DIAGNOSIS:  Same  PROCEDURE:  Right total knee arthroplasty using computer-assisted navigation  SURGEON:  Marciano Sequin. M.D.  ASSISTANT: Cassell Smiles, PA-C (present and scrubbed throughout the case, critical for assistance with exposure, retraction, instrumentation, and closure)  ANESTHESIA: spinal  ESTIMATED BLOOD LOSS: 50 mL  FLUIDS REPLACED: 1200 mL of crystalloid  TOURNIQUET TIME: 113 minutes  DRAINS: 2 medium Hemovac drains  SOFT TISSUE RELEASES: Anterior cruciate ligament, posterior cruciate ligament, deep and superficial medial collateral ligament, patellofemoral ligament  IMPLANTS UTILIZED: DePuy Attune size 5 posterior stabilized femoral component (cemented), size 5 rotating platform tibial component (cemented), 38 mm medialized dome patella (cemented), and a 7 mm stabilized rotating platform polyethylene insert.  INDICATIONS FOR SURGERY: RAND GOWAN is a 58 y.o. year old female with a long history of progressive knee pain. X-rays demonstrated severe degenerative changes in tricompartmental fashion. The patient had not seen any significant improvement despite conservative nonsurgical intervention. After discussion of the risks and benefits of surgical intervention, the patient expressed understanding of the risks benefits and agree with plans for total knee arthroplasty.   The risks, benefits, and alternatives were discussed at length including but not limited to the risks of infection, bleeding, nerve injury, stiffness, blood clots, the need for revision surgery, cardiopulmonary complications, among others, and they were willing to proceed.  PROCEDURE IN DETAIL: The patient was brought into the operating room and, after adequate spinal anesthesia was achieved, a  tourniquet was placed on the patient's upper thigh. The patient's knee and leg were cleaned and prepped with alcohol and DuraPrep and draped in the usual sterile fashion. A "timeout" was performed as per usual protocol. The lower extremity was exsanguinated using an Esmarch, and the tourniquet was inflated to 300 mmHg. An anterior longitudinal incision was made followed by a standard mid vastus approach. The deep fibers of the medial collateral ligament were elevated in a subperiosteal fashion off of the medial flare of the tibia so as to maintain a continuous soft tissue sleeve. The patella was subluxed laterally and the patellofemoral ligament was incised. Inspection of the knee demonstrated severe degenerative changes with full-thickness loss of articular cartilage. Osteophytes were debrided using a rongeur. Anterior and posterior cruciate ligaments were excised.  Remnants of suture material from the patient's previous ACL reconstruction were removed.  Two 4.0 mm Schanz pins were inserted in the femur and into the tibia for attachment of the array of trackers used for computer-assisted navigation. Hip center was identified using a circumduction technique. Distal landmarks were mapped using the computer. The distal femur and proximal tibia were mapped using the computer. The distal femoral cutting guide was positioned using computer-assisted navigation so as to achieve a 5 distal valgus cut. The femur was sized and it was felt that a size 5 femoral component was appropriate. A size 5 femoral cutting guide was positioned and the anterior cut was performed and verified using the computer. This was followed by completion of the posterior and chamfer cuts. Femoral cutting guide for the central box was then positioned in the center box cut was performed.  Attention was then directed to the proximal tibia. Medial and lateral menisci were excised. The extramedullary tibial cutting guide was positioned using  computer-assisted navigation so as to achieve a 0 varus-valgus alignment and  3 posterior slope. The cut was performed and verified using the computer. The proximal tibia was sized and it was felt that a size 5 tibial tray was appropriate. Tibial and femoral trials were inserted followed by insertion of a 5 mm polyethylene insert. The knee was felt to be tight medially. A Cobb elevator was used to elevate the superficial fibers of the medial collateral ligament.  A 7 mm polyethylene trial was used to replace the 5 mm trial.  This allowed for excellent mediolateral soft tissue balancing both in flexion and in full extension. Finally, the patella was cut and prepared so as to accommodate a 38 mm medialized dome patella. A patella trial was placed and the knee was placed through a range of motion with excellent patellar tracking appreciated. The femoral trial was removed after debridement of posterior osteophytes. The central post-hole for the tibial component was reamed followed by insertion of a keel punch. Tibial trials were then removed. Cut surfaces of bone were irrigated with copious amounts of normal saline with antibiotic solution using pulsatile lavage and then suctioned dry. Polymethylmethacrylate cement with gentamicin was prepared in the usual fashion using a vacuum mixer. Cement was applied to the cut surface of the proximal tibia as well as along the undersurface of a size 5 rotating platform tibial component. Tibial component was positioned and impacted into place. Excess cement was removed using [employer] fast streamer. Cement was then applied to the cut surfaces of the femur as well as along the posterior flanges of the size 5 femoral component. The femoral component was positioned and impacted into place. Excess cement was removed using [employer] fast streamer. A 7 mm polyethylene trial was inserted and the knee was brought into full extension with steady axial compression applied. Finally, cement was applied to the  backside of a 38 mm medialized dome patella and the patellar component was positioned and patellar clamp applied. Excess cement was removed using [employer] fast streamer. After adequate curing of the cement, the tourniquet was deflated after a total tourniquet time of 113 minutes. Hemostasis was achieved using electrocautery. The knee was irrigated with copious amounts of normal saline with antibiotic solution using pulsatile lavage and then suctioned dry. 20 mL of 1.3% Exparel and 60 mL of 0.25% Marcaine in 40 mL of normal saline was injected along the posterior capsule, medial and lateral gutters, and along the arthrotomy site. A 7 mm stabilized rotating platform polyethylene insert was inserted and the knee was placed through a range of motion with excellent mediolateral soft tissue balancing appreciated and excellent patellar tracking noted. 2 medium drains were placed in the wound bed and brought out through separate stab incisions. The medial parapatellar portion of the incision was reapproximated using interrupted sutures of #1 Vicryl. Subcutaneous tissue was approximated in layers using first #0 Vicryl followed #2-0 Vicryl. The skin was approximated with skin staples. A sterile dressing was applied.  The patient tolerated the procedure well and was transported to the recovery room in stable condition.     P. Holley Bouche., M.D.

## 2019-05-02 NOTE — Transfer of Care (Signed)
Immediate Anesthesia Transfer of Care Note  Patient: Rachael Brewer  Procedure(s) Performed: COMPUTER ASSISTED TOTAL KNEE ARTHROPLASTY (Right Knee)  Patient Location: PACU  Anesthesia Type:General and Spinal  Level of Consciousness: awake, alert , oriented and patient cooperative  Airway & Oxygen Therapy: Patient Spontanous Breathing and Patient connected to face mask oxygen  Post-op Assessment: Report given to RN, Post -op Vital signs reviewed and stable and Patient moving all extremities X 4  Post vital signs: Reviewed and stable  Last Vitals:  Vitals Value Taken Time  BP 103/65 05/02/19 1616  Temp 36.8 C 05/02/19 1616  Pulse 78 05/02/19 1616  Resp 11 05/02/19 1616  SpO2 97 % 05/02/19 1616    Last Pain:  Vitals:   05/02/19 1047  TempSrc: Tympanic         Complications: No apparent anesthesia complications

## 2019-05-02 NOTE — Progress Notes (Signed)
Patient admitted to unit via PACU stretcher. Polar Pak in place to right knee. Along with compression dressing.  Foot pumps operational bilaterally. Patient is alert and oriented.Hemo-Vac in place to right knee. NS infusing at 11ml/hr.  Patient requires C-Pap at [place].  Foley cath intact and draining clear yellow urine.  Staff will monitor for comfort.

## 2019-05-02 NOTE — Anesthesia Procedure Notes (Addendum)
Spinal  Patient location during procedure: OR Start time: 05/02/2019 12:03 PM End time: 05/02/2019 12:09 PM Staffing Resident/CRNA: Leeroy Cha, CRNA Performed: resident/CRNA  Preanesthetic Checklist Completed: patient identified, site marked, surgical consent, pre-op evaluation, timeout performed, IV checked, risks and benefits discussed and monitors and equipment checked Spinal Block Patient position: sitting Prep: DuraPrep Patient monitoring: heart rate, cardiac monitor, continuous pulse ox and blood pressure Approach: midline Location: L3-4 Injection technique: single-shot Needle Needle type: Sprotte  Needle gauge: 24 G Needle length: 9 cm Assessment Sensory level: T4

## 2019-05-03 ENCOUNTER — Encounter: Payer: Self-pay | Admitting: Orthopedic Surgery

## 2019-05-03 NOTE — Evaluation (Signed)
Occupational Therapy Evaluation Patient Details Name: Rachael Brewer MRN: [id number] DOB: 1960/12/07 Today's Date: 05/03/2019    History of Present Illness Rachael Brewer is a 51yoF who comes to [facility] on 05/02/19 for Elective Rt TKA. PMH: OSA, preDM, migraine HA, GERD, GAD.   Clinical Impression   Pt is 58 year old female s/p R TKA.  Pt was independent in all ADLs prior to surgery and is eager to return to PLOF.  She lives at home with her husband and 3 sons who are available to assist as needed. Pt currently requires min assist for LB dressing while in seated position due to pain and limited AROM of R knee.  Pt would benefit from instruction in dressing techniques with or without assistive devices for dressing and bathing skills.  Pt would also benefit from recommendations for home modifications to increase safety in the bathroom and prevent falls. Rec a reacher, sock aid and a transfer tub bench with grab bars in tub/shower to prevent falls and was educated in options for these. Will assess for OT [facility] needs as pt progresses in therapy.      Follow Up Recommendations  Home health OT    Equipment Recommendations  Tub/shower bench    Recommendations for Other Services       Precautions / Restrictions Precautions Precautions: Fall;Knee Restrictions Weight Bearing Restrictions: Yes RLE Weight Bearing: Weight bearing as tolerated      Mobility Bed Mobility Overal bed mobility: Modified Independent Bed Mobility: Supine to Sit     Supine to sit: Modified independent (Device/Increase time)     General bed mobility comments: moving well  Transfers Overall transfer level: Needs assistance Equipment used: Rolling walker (2 wheeled) Transfers: Sit to/from Stand Sit to Stand: Supervision              Balance Overall balance assessment: Modified Independent;No apparent balance deficits (not formally assessed)                                         ADL  either performed or assessed with clinical judgement   ADL Overall ADL's : Needs assistance/impaired Eating/Feeding: Independent;Set up   Grooming: Wash/dry hands;Wash/dry face;Oral care;Applying deodorant;Brushing hair;Independent;Set up   Upper Body Bathing: Independent;Set up   Lower Body Bathing: Set up;Minimal assistance   Upper Body Dressing : Independent;Set up   Lower Body Dressing: Set up;Minimal assistance                 General ADL Comments: Pt is progressing well with use of RW for ambulation and walker around NSG station today but had increase in pain from 2/6 to 5/6 and seen shortly afterward.  Her husband can install grab bars and she plans to purchase a transfer tub bench for the tub for bathing.     Vision Patient Visual Report: No change from baseline       Perception     Praxis      Pertinent Vitals/Pain Pain Assessment: 0-10 Pain Score: 4  Pain Location: operative knee Pain Descriptors / Indicators: Aching Pain Intervention(s): Limited activity within patient's tolerance;Monitored during session;Premedicated before session;Ice applied;Repositioned     Hand Dominance Right   Extremity/Trunk Assessment Upper Extremity Assessment Upper Extremity Assessment: Overall WFL for tasks assessed   Lower Extremity Assessment Lower Extremity Assessment: Defer to PT evaluation   Cervical / Trunk Assessment Cervical / Trunk Assessment: Normal  Communication Communication Communication: No difficulties   Cognition Arousal/Alertness: Awake/alert Behavior During Therapy: WFL for tasks assessed/performed Overall Cognitive Status: Within Functional Limits for tasks assessed                                     General Comments       Exercises Total Joint Exercises Ankle Circles/Pumps: AROM;20 reps;Supine;Both Quad Sets: AROM;Both;10 reps;Supine Heel Slides: AAROM;Right;10 reps;Supine Hip ABduction/ADduction: AROM;Right;15  reps;Supine Straight Leg Raises: AROM;10 reps;Supine Goniometric ROM: Right knee flexion P/ROM: 12-98 degrees   Shoulder Instructions      Home Living Family/patient expects to be discharged to:: Private residence Living Arrangements: Spouse/significant other;Children Available Help at Discharge: Family Type of Home: House Home Access: Stairs to enter Technical brewer of Steps: 1   Home Layout: One level     Bathroom Shower/Tub: Teacher, early years/pre: Standard Bathroom Accessibility: Yes How Accessible: Accessible via walker Home Equipment: [place] - single point          Prior Functioning/Environment Level of Independence: Independent                 OT Problem List: Decreased strength;Decreased range of motion;Decreased activity tolerance;Pain;Decreased knowledge of use of DME or AE      OT Treatment/Interventions: Self-care/ADL training;Patient/family education;DME and/or AE instruction    OT Goals(Current goals can be found in the care plan section) Acute Rehab OT Goals Patient Stated Goal: regain independence in ADls OT Goal Formulation: With patient Time For Goal Achievement: 05/17/19 Potential to Achieve Goals: Good ADL Goals Pt Will Perform Lower Body Dressing: with set-up;Independently;with adaptive equipment;sit to/from stand Pt Will Transfer to Toilet: with supervision;with set-up;regular height toilet;stand pivot transfer Pt Will Perform Toileting - Clothing Manipulation and hygiene: Independently  OT Frequency: Min 2X/week   Barriers to D/C:            Co-evaluation              AM-PAC OT "6 Clicks" Daily Activity     Outcome Measure Help from another person eating meals?: None Help from another person taking care of personal grooming?: None Help from another person toileting, which includes using toliet, bedpan, or urinal?: A Little Help from another person bathing (including washing, rinsing, drying)?: A Little Help  from another person to put on and taking off regular upper body clothing?: None Help from another person to put on and taking off regular lower body clothing?: A Little 6 Click Score: 21   End of Session Equipment Utilized During Treatment: Gait belt  Activity Tolerance: Patient tolerated treatment well Patient left: in chair;with call bell/phone within reach;with chair alarm set  OT Visit Diagnosis: Other abnormalities of gait and mobility (R26.89);Pain Pain - Right/Left: Right Pain - part of body: Knee                Time: 1015-1040 OT Time Calculation (min): 25 min Charges:  OT General Charges $OT Visit: 1 Visit OT Treatments $Self Care/Home Management : 8-22 mins  Chrys Racer, OTR/L, [place] ascom [phone] 05/03/19, 11:00 AM

## 2019-05-03 NOTE — Evaluation (Signed)
Physical Therapy Evaluation Patient Details Name: Rachael Brewer MRN: [id number] DOB: [birth year] Today's Date: 05/03/2019   History of Present Illness  Rachael Brewer is a 58yoF who comes to [facility] on 05/02/19 for Elective Rt TKA. PMH: OSA, preDM, migraine HA, GERD, GAD.  Clinical Impression  Pt admitted with above diagnosis. Pt currently with functional limitations due to the deficits listed below (see "PT Problem List"). Upon entry, pt in bed, awake and agreeable to participate. Orthostatic vitals established in session are negative. No presyncopal prodrome. The pt is alert and oriented x4, pleasant, conversational, and generally a good historian. No physical assistance needed in session, but minGuard assist intermittently for transfer and gait initially. ROM advanced for postoperative timeline. Excellent capacity for following cues in HEP and gait training. Pt will benefit from skilled PT intervention to increase independence and safety with basic mobility in preparation for discharge to the venue listed below.       Follow Up Recommendations Follow surgeon's recommendation for DC plan and follow-up therapies;Supervision - Intermittent;Home health PT    Equipment Recommendations  Rolling walker with 5" wheels;3in1 (PT)    Recommendations for Other Services       Precautions / Restrictions Precautions Precautions: Fall;Knee Restrictions RLE Weight Bearing: Weight bearing as tolerated      Mobility  Bed Mobility Overal bed mobility: Modified Independent Bed Mobility: Supine to Sit     Supine to sit: Modified independent (Device/Increase time)     General bed mobility comments: moving well  Transfers Overall transfer level: Needs assistance Equipment used: Rolling walker (2 wheeled) Transfers: Sit to/from Stand Sit to Stand: Supervision            Ambulation/Gait Ambulation/Gait assistance: Supervision Gait Distance (Feet): 120 Feet Assistive device: Rolling  walker (2 wheeled) Gait Pattern/deviations: Step-to pattern Gait velocity: 0.33m/s   General Gait Details: good following of cues for heel strike, flexed knee swing phase, and TKE in midstance; nearly achieveing a step-throughout gait bilat.  Stairs            Wheelchair Mobility    Modified Rankin (Stroke Patients Only)       Balance Overall balance assessment: Modified Independent;No apparent balance deficits (not formally assessed)                                           Pertinent Vitals/Pain Pain Assessment: 0-10 Pain Score: 2  Pain Location: operative knee Pain Descriptors / Indicators: Aching Pain Intervention(s): Limited activity within patient's tolerance;Monitored during session;Premedicated before session    Home Living Family/patient expects to be discharged to:: Private residence Living Arrangements: Spouse/significant other;Children(3 sons 68, 88, 84yo) Available Help at Discharge: Family Type of Home: House Home Access: Stairs to enter   Technical brewer of Steps: 1 Home Layout: One level Home Equipment: Cane - single point      Prior Function Level of Independence: Independent               Hand Dominance   Dominant Hand: Right    Extremity/Trunk Assessment   Upper Extremity Assessment Upper Extremity Assessment: Overall WFL for tasks assessed    Lower Extremity Assessment Lower Extremity Assessment: Overall WFL for tasks assessed    Cervical / Trunk Assessment Cervical / Trunk Assessment: Normal  Communication   Communication: No difficulties  Cognition Arousal/Alertness: Awake/alert Behavior During Therapy: WFL for tasks assessed/performed Overall Cognitive Status:  Within Functional Limits for tasks assessed                                        General Comments      Exercises Total Joint Exercises Ankle Circles/Pumps: AROM;20 reps;Supine;Both Quad Sets: AROM;Both;10  reps;Supine Heel Slides: AAROM;Right;10 reps;Supine Hip ABduction/ADduction: AROM;Right;15 reps;Supine Straight Leg Raises: AROM;10 reps;Supine Goniometric ROM: Right knee flexion P/ROM: 12-98 degrees   Assessment/Plan    PT Assessment Patient needs continued PT services  PT Problem List Decreased strength;Decreased activity tolerance;Decreased mobility;Decreased knowledge of precautions;Decreased knowledge of use of DME;Decreased range of motion       PT Treatment Interventions DME instruction;Gait training;Stair training;Functional mobility training;Therapeutic activities;Therapeutic exercise;Manual techniques;Wheelchair mobility training;Patient/family education;Cognitive remediation;Neuromuscular re-education;Balance training;Modalities    PT Goals (Current goals can be found in the Care Plan section)  Acute Rehab PT Goals Patient Stated Goal: maintain low pain level and progress AMB PT Goal Formulation: With patient Time For Goal Achievement: 05/17/19 Potential to Achieve Goals: Good    Frequency BID   Barriers to discharge        Co-evaluation               AM-PAC PT "6 Clicks" Mobility  Outcome Measure Help needed turning from your back to your side while in a flat bed without using bedrails?: None Help needed moving from lying on your back to sitting on the side of a flat bed without using bedrails?: None Help needed moving to and from a bed to a chair (including a wheelchair)?: None Help needed standing up from a chair using your arms (e.g., wheelchair or bedside chair)?: None Help needed to walk in hospital room?: A Little Help needed climbing 3-5 steps with a railing? : A Little 6 Click Score: 22    End of Session Equipment Utilized During Treatment: Gait belt Activity Tolerance: Patient tolerated treatment well;No increased pain Patient left: in chair;with chair alarm set;with call bell/phone within reach;with SCD's reapplied Nurse Communication: Mobility  status PT Visit Diagnosis: Unsteadiness on feet (R26.81);Other abnormalities of gait and mobility (R26.89)    Time: [id number] PT Time Calculation (min) (ACUTE ONLY): 40 min   Charges:   PT Evaluation $PT Eval Low Complexity: 1 Low PT Treatments $Gait Training: 8-22 mins $Therapeutic Exercise: 8-22 mins        9:46 AM, 05/03/19 Etta Grandchild, PT, DPT Physical Therapist - [facility]  [phone] ([place])   Elayjah Chaney C 05/03/2019, 9:44 AM

## 2019-05-03 NOTE — Progress Notes (Signed)
Physical Therapy Treatment Patient Details Name: Rachael Brewer MRN: [id number] DOB: Nov 05, 1960 Today's Date: 05/03/2019    History of Present Illness Rachael Brewer is a 65yoF who comes to [facility] on 05/02/19 for Elective Rt TKA. PMH: OSA, preDM, migraine HA, GERD, GAD.    PT Comments    Author returning for pt's 2nd session with PT this date. Pt received in chair, still up from morning. Exercises reviewed in recliner. Pt allowed time to practice gait cues from earlier this date in hallway with good success, now able to achieve a step-through gait. Gait speed also more than double from AM and correlates with limited community distance amb. Pt has mild decomposition in gait symmetry, but denies pain being a factor. Pt progressing very well.     Follow Up Recommendations  Follow surgeon's recommendation for DC plan and follow-up therapies;Supervision - Intermittent;Home health PT     Equipment Recommendations  Rolling walker with 5" wheels;3in1 (PT)    Recommendations for Other Services       Precautions / Restrictions Precautions Precautions: Fall;Knee Restrictions Weight Bearing Restrictions: Yes RLE Weight Bearing: Weight bearing as tolerated    Mobility  Bed Mobility Overal bed mobility: Modified Independent Bed Mobility: Sit to Supine       Sit to supine: Supervision   General bed mobility comments: shown an ankle hook technique, but patient does not need this.  Transfers Overall transfer level: Needs assistance Equipment used: Rolling walker (2 wheeled) Transfers: Sit to/from Stand Sit to Stand: Modified independent (Device/Increase time)         General transfer comment: Pt has bene up to BR twice since EVAL with nursing.  Ambulation/Gait Ambulation/Gait assistance: Supervision Gait Distance (Feet): 350 Feet Assistive device: Rolling walker (2 wheeled) Gait Pattern/deviations: Step-to pattern Gait velocity: 0.79m/s this session (<0.97m/s this AM) Gait  velocity interpretation: (deferred until next day.) General Gait Details: Good demonstration of gait cues given this morning without reminder. Able to achieve a step-through gait this afternoon.   Stairs             Wheelchair Mobility    Modified Rankin (Stroke Patients Only)       Balance                                            Cognition Arousal/Alertness: Awake/alert Behavior During Therapy: WFL for tasks assessed/performed Overall Cognitive Status: Within Functional Limits for tasks assessed                                        Exercises Total Joint Exercises Ankle Circles/Pumps: AROM;20 reps;Supine;Both Long Arc Quad: AROM;Both;10 reps;Seated Knee Flexion: AROM;Both;10 reps;Seated    General Comments        Pertinent Vitals/Pain Pain Assessment: 0-10 Pain Score: 4  Pain Location: operative knee Pain Descriptors / Indicators: Aching Pain Intervention(s): Limited activity within patient's tolerance;Monitored during session    [place] expects to be discharged to:: Private residence Living Arrangements: Spouse/significant other;Children Available Help at Discharge: Family Type of Home: House Home Access: Stairs to enter   Home Layout: One level Home Equipment: Kasandra Knudsen - single point      Prior Function Level of Independence: Independent          PT Goals (current goals can now  be found in the care plan section) Acute Rehab PT Goals Patient Stated Goal: regain independence in ADls PT Goal Formulation: With patient Time For Goal Achievement: 05/17/19 Potential to Achieve Goals: Good Progress towards PT goals: Progressing toward goals    Frequency    BID      PT Plan Current plan remains appropriate    Co-evaluation              AM-PAC PT "6 Clicks" Mobility   Outcome Measure  Help needed turning from your back to your side while in a flat bed without using bedrails?:  None Help needed moving from lying on your back to sitting on the side of a flat bed without using bedrails?: None Help needed moving to and from a bed to a chair (including a wheelchair)?: None Help needed standing up from a chair using your arms (e.g., wheelchair or bedside chair)?: None Help needed to walk in hospital room?: A Little Help needed climbing 3-5 steps with a railing? : A Little 6 Click Score: 22    End of Session Equipment Utilized During Treatment: Gait belt Activity Tolerance: Patient tolerated treatment well;No increased pain Patient left: in chair;with chair alarm set;with call bell/phone within reach;with SCD's reapplied Nurse Communication: Mobility status PT Visit Diagnosis: Unsteadiness on feet (R26.81);Other abnormalities of gait and mobility (R26.89)     Time: [id number] PT Time Calculation (min) (ACUTE ONLY): 24 min  Charges:  $Gait Training: 8-22 mins $Therapeutic Exercise: 8-22 mins                     2:05 PM, 05/03/19 Etta Grandchild, PT, DPT Physical Therapist - [facility]  [phone] ([place])    Buccola,Allan C 05/03/2019, 2:03 PM

## 2019-05-03 NOTE — TOC Progression Note (Signed)
Transition of Care [facility]) - Progression Note    Patient Details  Name: Rachael Brewer MRN: [id number] Date of Birth: [birth year]  Transition of Care [facility]) CM/SW Contact  Shelbie Hutching, RN Phone Number: 05/03/2019, 2:09 PM  Clinical Narrative:    Copay for Lovenox with patient insurance will be $293.86.  Patient notified of copay amount.  Patient [facility] use [facility] [place] for $75.35.  Patient will use Good Rx.  Good RX card and coupon given to patient.     Expected Discharge Plan: [place] Barriers to Discharge: Continued Medical Work up  Expected Discharge Plan and Services Expected Discharge Plan: [place]   Discharge Planning Services: CM Consult Post Acute Care Choice: [place] arrangements for the past 2 months: Single Family Home                 DME Arranged: Walker rolling, 3-N-1 DME Agency: [employer] Date DME Agency Contacted: 05/03/19 Time DME Agency Contacted: [phone] Representative spoke with at DME Agency: [place]: PT, OT [place] Agency: [facility] ([facility]) Date [place]: 05/03/19 Time [place]: 1253 Representative spoke with at [place]: [place] (SDOH) Interventions    Readmission Risk Interventions No flowsheet data found.

## 2019-05-03 NOTE — TOC Initial Note (Signed)
Transition of Care [facility]) - Initial/Assessment Note    Patient Details  Name: Rachael Brewer MRN: [id number] Date of Birth: May 29, 1961  Transition of Care [facility]) CM/SW Contact:    Shelbie Hutching, RN Phone Number: 05/03/2019, 12:54 PM  Clinical Narrative:                 Patient is from home with husband and 3 sons.  Patient admitted after total knee replacement, plan is for discharge home with home health tomorrow.  Patient chooses [facility] for home health OT and PT.  Referral given to Drue Novel with [facility].  Patient needs a walker and 3 in 1, to be delivered to the room by [employer] before discharge.    Expected Discharge Plan: [place] Barriers to Discharge: Continued Medical Work up   Patient Goals and CMS Choice   CMS Medicare.gov Compare Post Acute Care list provided to:: Patient Choice offered to / list presented to : Patient  Expected Discharge Plan and Services Expected Discharge Plan: [place]   Discharge Planning Services: CM Consult Post Acute Care Choice: [place] arrangements for the past 2 months: Single Family Home                 DME Arranged: Walker rolling, 3-N-1 DME Agency: [employer] Date DME Agency Contacted: 05/03/19 Time DME Agency Contacted: [phone] Representative spoke with at DME Agency: [place] Arranged: PT, OT [place] Agency: [facility] (now [facility]) Date [place]: 05/03/19 Time [place]: 1253 Representative spoke with at [place]: Drue Novel  Prior Living Arrangements/Services Living arrangements for the past 2 months: [place] with:: Spouse Patient language and need for interpreter reviewed:: No Do you feel safe going back to the place where you live?: Yes      Need for Family Participation in Patient Care: Yes (Comment)(knee replacement) Care giver support system in place?: Yes (comment)(husband and 3 sons)   Criminal  Activity/Legal Involvement Pertinent to Current Situation/Hospitalization: No - Comment as needed  Activities of Daily Living Home Assistive Devices/Equipment: CPAP, Eyeglasses, Hearing aid, Cane (specify quad or straight) ADL Screening (condition at time of admission) Patient's cognitive ability adequate to safely complete daily activities?: Yes Is the patient deaf or have difficulty hearing?: Yes Does the patient have difficulty seeing, even when wearing glasses/contacts?: No Does the patient have difficulty concentrating, remembering, or making decisions?: No Patient able to express need for assistance with ADLs?: Yes Does the patient have difficulty dressing or bathing?: No Independently performs ADLs?: Yes (appropriate for developmental age) Does the patient have difficulty walking or climbing stairs?: Yes Weakness of Legs: Right Weakness of Arms/Hands: None  Permission Sought/Granted Permission sought to share information with : Case Manager, Other (comment) Permission granted to share information with : Yes, Verbal Permission Granted     Permission granted to share info w AGENCY: [facility]        Emotional Assessment Appearance:: Appears stated age Attitude/Demeanor/Rapport: Engaged Affect (typically observed): Accepting Orientation: : Oriented to Self, Oriented to Place, Oriented to  Time, Oriented to Situation Alcohol / Substance Use: Not Applicable Psych Involvement: No (comment)  Admission diagnosis:  PRIMARY OSTEOARTHRITIS OD RIGHT KNEE. Patient Active Problem List   Diagnosis Date Noted  . Total knee replacement status 05/02/2019  . External hemorrhoid 09/22/2017  . Hypokalemia 06/24/2017  . Lymphedema 05/07/2016  . Obesity 01/01/2016  . Bilateral edema of lower extremity 01/01/2016  .  Venous insufficiency 01/01/2016  . Pes planus of both feet 01/01/2016  . Apnea 01/01/2016  . AB (asthmatic bronchitis) 06/10/2015  . Allergic rhinitis 06/10/2015  . Benign  paroxysmal positional nystagmus 06/10/2015  . Fatigue 06/10/2015  . Acid reflux 06/10/2015  . Blood glucose elevated 06/10/2015  . Lesion of nose 06/10/2015  . Low back strain 06/10/2015  . Hemorrhage, postmenopausal 06/10/2015  . Plantar fasciitis 12/02/2014  . Posterior tibial tendonitis 12/02/2014  . Arthropathia 12/18/2008  . Depression, recurrent ([place]) 07/21/2006  . Hypercholesteremia 07/21/2006  . BP (high blood pressure) 07/21/2006  . Headache, migraine 07/21/2006   PCP:  Virginia Crews, MD Pharm[facility]   [id number] [place], [place] [address] [address] Phone: [phone] Fax: [phone]     Social Determinants of Health (SDOH) Interventions    Readmission Risk Interventions No flowsheet data found.

## 2019-05-03 NOTE — Progress Notes (Signed)
ORTHOPAEDICS PROGRESS NOTE  PATIENT NAME: Rachael Brewer DOB: 12-Mar-1961  MRN: [id number]  POD # 1: Right total knee arthroplasty  Subjective: The patient states that the pain is under good control this morning.  She did have some difficulty tolerating the bone foam last night. She denies any nausea or vomiting.  Objective: Vital signs in last 24 hours: Temp:  [97 F (36.1 C)-98.6 F (37 C)] 98.3 F (36.8 C) (11/12 0518) Pulse Rate:  [72-83] 81 (11/12 0518) Resp:  [11-19] 18 (11/12 0518) BP: (93-144)/(46-92) 96/55 (11/12 0518) SpO2:  [92 %-98 %] 92 % (11/12 0518) Weight:  [id number] kg] 113 kg (11/11 1047)  Intake/Output from previous day: 11/11 0701 - 11/12 0700 In: 2701.2 [P.O.:360; I.V.:2141.2; IV Piggyback:200] Out: 3525 [Urine:3325; Drains:150; Blood:50]  No results for input(s): WBC, HGB, HCT, PLT, K, CL, CO2, BUN, CREATININE, GLUCOSE, CALCIUM, LABPT, INR in the last 72 hours.  EXAM General: Well-developed well-nourished female seen in no apparent discomfort. Lungs: clear to auscultation Cardiac: normal rate and regular rhythm Abdomen: Soft, nontender, nondistended.  Active bowel sounds are present. Right lower extremity: Dressing is dry and intact.  Hemovac drain and Polar Care are in place and functioning.  The patient is able to perform an independent straight leg raise.  Homans test is negative. Neurologic: Awake, alert, and oriented.  Sensory and motor function are intact.  Assessment: Right total knee arthroplasty  Secondary diagnoses: Sleep apnea Prediabetes Migraines Gastroesophageal reflux disease Depression Anxiety  Plan: Today's goals were reviewed with the patient. Begin physical therapy and Occupational Therapy as per total knee arthroplasty rehab protocol. Plan is to go Home after hospital stay. DVT Prophylaxis - Lovenox, Foot Pumps and TED hose  Ladashia Demarinis P. Holley Bouche M.D.

## 2019-05-03 NOTE — TOC Progression Note (Signed)
Transition of Care [facility]) - Progression Note    Patient Details  Name: Rachael Brewer MRN: [id number] Date of Birth: 1961/06/06  [facility]) CM/SW Contact  Shelbie Hutching, RN Phone Number: 05/03/2019, 10:38 AM  Clinical Narrative:     Request benefit check on lovenox 40 mg x 14 days.        Expected Discharge Plan and Services                                                 Social Determinants of Health (SDOH) Interventions    Readmission Risk Interventions No flowsheet data found.

## 2019-05-03 NOTE — Progress Notes (Signed)
   05/03/19 0937  Therapy Vitals  Patient Position (if appropriate) Orthostatic Vitals  Orthostatic Lying   BP- Lying 118/68  Pulse- Lying 81  Orthostatic Sitting  BP- Sitting 123/64  Pulse- Sitting 80  Orthostatic Standing at 0 minutes  BP- Standing at 0 minutes 122/74  Pulse- Standing at 0 minutes 74    Established during PT evaluation.  9:47 AM, 05/03/19 Etta Grandchild, PT, DPT Physical Therapist - [facility]  [phone] [facility])

## 2019-05-03 NOTE — Progress Notes (Signed)
Pt unable to tolerate bone foam. Benefits explained to patient, pt still refused. Towel roll placed under heel.  Will continue to monitor patient

## 2019-05-03 NOTE — TOC Benefit Eligibility Note (Signed)
Transition of Care [facility]) Benefit Eligibility Note    Patient Details  Name: Rachael Brewer MRN: [id number] Date of Birth: May 30, 1961   Medication/Dose: LOVENOX  44 MG DAILY  X 14 DAYS SYRINGES  Covered?: Yes  Tier: 3 Drug  Prescription Coverage [facility]  Spoke with Person/Company/Phone Number:: AMBER  @ [employer]  # [phone]  Co-Pay: $293.86  Prior Approval: No  Deductible: Met(OUT-OF-POCKET:NOT MET)  Additional Notes: ENOXAPARIN 40 MG DAILY  X 14 DAYS  SYRINGES, COVER- YES, CO-PAY- $ 10.00, TIER- 1 DRUG, P/A -NO    Memory Argue Phone Number: 05/03/2019, 11:55 AM

## 2019-05-03 NOTE — Anesthesia Postprocedure Evaluation (Signed)
Anesthesia Post Note  Patient: DINNA MCCALVIN  Procedure(s) Performed: COMPUTER ASSISTED TOTAL KNEE ARTHROPLASTY (Right Knee)  Patient location during evaluation: Nursing Unit Anesthesia Type: Spinal Level of consciousness: awake, awake and alert and oriented Pain management: pain level controlled Vital Signs Assessment: post-procedure vital signs reviewed and stable Respiratory status: spontaneous breathing and respiratory function stable Cardiovascular status: stable Postop Assessment: no headache, no backache, no apparent nausea or vomiting, able to ambulate, adequate PO intake and patient able to bend at knees Anesthetic complications: no     Last Vitals:  Vitals:   05/03/19 0518 05/03/19 0840  BP: (!) 96/55 104/66  Pulse: 81 73  Resp: 18   Temp: 36.8 C 36.5 C  SpO2: 92% 99%    Last Pain:  Vitals:   05/03/19 0840  TempSrc: Oral  PainSc:                  Lanora Manis

## 2019-05-04 MED ORDER — ENOXAPARIN SODIUM 40 MG/0.4ML ~~LOC~~ SOLN: 40.0000 mg | SUBCUTANEOUS | 0 refills | Status: DC

## 2019-05-04 MED ORDER — OXYCODONE HCL 5 MG PO TABS: 5.0000 mg | ORAL_TABLET | ORAL | 0 refills | Status: DC | PRN

## 2019-05-04 MED ORDER — CELECOXIB 200 MG PO CAPS: 200.0000 mg | ORAL_CAPSULE | Freq: Two times a day (BID) | ORAL | 0 refills | Status: DC

## 2019-05-04 NOTE — Progress Notes (Signed)
PT Cancellation Note  Patient Details Name: Rachael Brewer MRN: [id number] DOB: 1961-02-06   Cancelled Treatment:    Reason Eval/Treat Not Completed: Pain limiting ability to participate(Met with patient at bedside. Pt reports continued high pain levels, is reluctant to do PT at this time for fear of further exacerbation. RN reports pt is unable to have pain meds at this time as she is preparing for DC in the next 2 hours.) Pt has already met all PT goals. Pt is given time to ask any questions she may have regarding her therapy or HEP. She asks about using leg compression devices at home which she has from PTA, and RN gives response. PT deferred at this time as pt prepares for DC to home.   1:22 PM, 05/04/19 Etta Grandchild, PT, DPT Physical Therapist - [facility]  [phone] ([place])     Yailen Zemaitis C 05/04/2019, 1:20 PM

## 2019-05-04 NOTE — Progress Notes (Signed)
Discharge summary reviewed with verbal understanding. Dressing changed, Thigh Ted applied, provided two dressings per order. Lovenox teaching performed satisfactory, Lovenox kit provided. Awaiting transportation.

## 2019-05-04 NOTE — Progress Notes (Signed)
  Subjective: 2 Days Post-Op Procedure(s) (LRB): COMPUTER ASSISTED TOTAL KNEE ARTHROPLASTY (Right) Patient reports pain as well-controlled.   Patient is well, and has had no acute complaints or problems Plan is to go Home after hospital stay. Negative for chest pain and shortness of breath Fever: no Gastrointestinal: negative for nausea and vomiting.  Patient has not had a bowel movement.  Objective: Vital signs in last 24 hours: Temp:  [97.5 F (36.4 C)-97.9 F (36.6 C)] 97.5 F (36.4 C) (11/12 1935) Pulse Rate:  [66-82] 76 (11/13 0753) Resp:  [17-20] 17 (11/13 0753) BP: (103-124)/(57-66) 113/57 (11/13 0753) SpO2:  [93 %-99 %] 93 % (11/13 0753)  Intake/Output from previous day:  Intake/Output Summary (Last 24 hours) at 05/04/2019 0755 Last data filed at 05/04/2019 0400 Gross per 24 hour  Intake 1099.74 ml  Output -  Net 1099.74 ml    Intake/Output this shift: No intake/output data recorded.  Labs: No results for input(s): HGB in the last 72 hours. No results for input(s): WBC, RBC, HCT, PLT in the last 72 hours. No results for input(s): NA, K, CL, CO2, BUN, CREATININE, GLUCOSE, CALCIUM in the last 72 hours. No results for input(s): LABPT, INR in the last 72 hours.   EXAM General - Patient is Alert, Appropriate and Oriented Extremity - Neurovascular intact Dorsiflexion/Plantar flexion intact Compartment soft Dressing/Incision -Postoperative dressing remains in place., Polar Care in place and working. , Hemovac in place.  Motor Function - intact, moving foot and toes well on exam.  Able to perform straight leg raise. Cardiovascular- Regular rate and rhythm, no murmurs/rubs/gallops Respiratory- Lungs clear to auscultation bilaterally Gastrointestinal- soft, nontender and hypoactive bowel sounds   Assessment/Plan: 2 Days Post-Op Procedure(s) (LRB): COMPUTER ASSISTED TOTAL KNEE ARTHROPLASTY (Right) Active Problems:   Total knee replacement status  Estimated body  mass index is 42.76 kg/m as calculated from the following:   Height as of this encounter: 5\' 4"  (1.626 m).   Weight as of this encounter: 113 kg. Advance diet Up with therapy Discharge home with home health  Postoperative dressing removed.  Hemovac removed.  Instructed for additional ice to be placed in Polar Care.  DVT Prophylaxis - Lovenox, Ted hose and foot pumps Weight-Bearing as tolerated to right leg  Cassell Smiles, PA-C [facility] 05/04/2019, 7:55 AM

## 2019-05-04 NOTE — TOC Transition Note (Signed)
Transition of Care [facility]) - CM/SW Discharge Note   Patient Details  Name: Rachael Brewer MRN: [id number] Date of Birth: Oct 30, 1960  [facility]) CM/SW Contact:  Tashina Credit, Lenice Llamas Phone Number: [phone]  05/04/2019, 10:04 AM   Clinical Narrative: Clinical Social Worker (CSW) notified Helene Kelp [facility] home health representative that patient will D/C home today. Helene Kelp is aware that patient will need home health PT and OT. Brad [employer] DME agency representative delivered rolling walker and bedside commode to room today. Patient was given a good Rx card for her Lovenox to use at her pharmacy in [place] for a co-pay of $75.35. Please reconsult if future social work needs arise. CSW signing off.      Final next level of care: Home w Home Health Services Barriers to Discharge: Barriers Resolved   Patient Goals and CMS Choice   CMS Medicare.gov Compare Post Acute Care list provided to:: Patient Choice offered to / list presented to : Patient  Discharge Placement                       Discharge Plan and Services   Discharge Planning Services: CM Consult Post Acute Care Choice: Home Health          DME Arranged: Bedside commode, Walker rolling DME Agency: [employer] Date DME Agency Contacted: 05/03/19 Time DME Agency Contacted: [phone] Representative spoke with at DME Agency: [place]: PT, OT [place] Agency: [facility] ([facility]) Date [place]: 05/04/19 Time [place]: 0945 Representative spoke with at [place]: [place] ([place]) Interventions     Readmission Risk Interventions No flowsheet data found.

## 2019-05-04 NOTE — Discharge Summary (Signed)
Physician Discharge Summary  Patient ID: Rachael Brewer MRN: [id number] DOB/AGE: 24-Mar-1961 58 y.o.  Admit date: 05/02/2019 Discharge date: 05/04/2019  Admission Diagnoses:  PRIMARY OSTEOARTHRITIS OD RIGHT KNEE.  Surgeries:Procedure(s): COMPUTER ASSISTED TOTAL KNEE ARTHROPLASTY on 05/02/2019  Discharge Diagnoses: Patient Active Problem List   Diagnosis Date Noted  . Total knee replacement status 05/02/2019  . External hemorrhoid 09/22/2017  . Hypokalemia 06/24/2017  . Lymphedema 05/07/2016  . Obesity 01/01/2016  . Bilateral edema of lower extremity 01/01/2016  . Venous insufficiency 01/01/2016  . Pes planus of both feet 01/01/2016  . Apnea 01/01/2016  . AB (asthmatic bronchitis) 06/10/2015  . Allergic rhinitis 06/10/2015  . Benign paroxysmal positional nystagmus 06/10/2015  . Fatigue 06/10/2015  . Acid reflux 06/10/2015  . Blood glucose elevated 06/10/2015  . Lesion of nose 06/10/2015  . Low back strain 06/10/2015  . Hemorrhage, postmenopausal 06/10/2015  . Plantar fasciitis 12/02/2014  . Posterior tibial tendonitis 12/02/2014  . Arthropathia 12/18/2008  . Depression, recurrent ([place]) 07/21/2006  . Hypercholesteremia 07/21/2006  . BP (high blood pressure) 07/21/2006  . Headache, migraine 07/21/2006    Past Medical History:  Diagnosis Date  . Anxiety   . Complication of anesthesia    CAUSES MIGRAINE  . Depression   . Edema of both lower extremities   . GERD (gastroesophageal reflux disease)   . Migraines   . Pre-diabetes   . Sleep apnea      Transfusion: n/a   Consultants (if any):   Discharged Condition: Improved  Hospital Course: Rachael Brewer is an 58 y.o. female who was admitted 05/02/2019 with a diagnosis of right knee degenerative osteoarthritis and went to the operating room on 05/02/2019 and underwent right total knee arthroplasty. The patient received perioperative antibiotics for prophylaxis (see below). The patient tolerated the procedure  well and was transported to PACU in stable condition. After meeting PACU criteria, the patient was subsequently transferred to the Orthopaedics/Rehabilitation unit.   The patient received DVT prophylaxis in the form of early mobilization, Lovenox, Foot Pumps and TED hose. A sacral pad had been placed and heels were elevated off of the bed with rolled towels in order to protect skin integrity. Foley catheter was discontinued on postoperative day #1. Wound drains were discontinued on postoperative day #2. The surgical incision was healing well without signs of infection.  Physical therapy was initiated postoperatively for transfers, gait training, and strengthening. Occupational therapy was initiated for activities of daily living and evaluation for assisted devices. Rehabilitation goals were reviewed in detail with the patient. The patient made steady progress with physical therapy and physical therapy recommended discharge to Home.   The patient achieved his preliminary goals of this hospitalization and was felt to be medically and orthopaedically appropriate for discharge.  She was given perioperative antibiotics:  Anti-infectives (From admission, onward)   Start     Dose/Rate Route Frequency Ordered Stop   05/02/19 1900  doxycycline (VIBRA-TABS) tablet 100 mg     100 mg Oral Daily 05/02/19 1844     05/02/19 1845  clindamycin (CLEOCIN) IVPB 600 mg     600 mg 100 mL/hr over 30 Minutes Intravenous Every 6 hours 05/02/19 1844 05/03/19 1707   05/02/19 1027  clindamycin (CLEOCIN) 900 MG/50ML IVPB    Note to Pharmacy: Ronnell Freshwater   : cabinet override      05/02/19 1027 05/02/19 1207   05/02/19 0600  clindamycin (CLEOCIN) IVPB 900 mg  Status:  Discontinued     900 mg 100  mL/hr over 30 Minutes Intravenous On call to O.R. 05/01/19 2208 05/02/19 1028    .  Recent vital signs:  Vitals:   05/03/19 1935 05/04/19 0753  BP: 103/63 (!) 113/57  Pulse: 82 76  Resp: 18 17  Temp: (!) 97.5 F (36.4 C)    SpO2: 95% 93%    Recent laboratory studies:  No results for input(s): WBC, HGB, HCT, PLT, K, CL, CO2, BUN, CREATININE, GLUCOSE, CALCIUM, LABPT, INR in the last 72 hours.  Diagnostic Studies: Dg Knee Right Port  Result Date: 05/02/2019 CLINICAL DATA:  Right knee arthroplasty EXAM: PORTABLE RIGHT KNEE - 1-2 VIEW COMPARISON:  None. FINDINGS: Postsurgical changes following right total knee arthroplasty. Arthroplasty components are in their expected alignment without periprosthetic fracture. Expected postoperative changes within the anterior soft tissues including suprapatellar drains and overlying skin staples. IMPRESSION: Satisfactory postop appearance status post right total knee arthroplasty. Electronically Signed   By: Davina Poke M.D.   On: 05/02/2019 16:35    Discharge Medications:   Allergies as of 05/04/2019      Reactions   Bactrim [sulfamethoxazole-trimethoprim] Shortness Of Breath, Swelling   Sulfa Antibiotics Anaphylaxis   Penicillins Hives   Did it involve swelling of the face/tongue/throat, SOB, or low BP? No Did it involve sudden or severe rash/hives, skin peeling, or any reaction on the inside of your mouth or nose? No Did you need to seek medical attention at a hospital or doctor's office? No When did it last happen?10 years ago If all above answers are "NO", may proceed with cephalosporin use.   Sumatriptan    Made migraine more intense and scalp felt like it was on fire      Medication List    TAKE these medications   albuterol 108 (90 Base) MCG/ACT inhaler Commonly known as: Ventolin HFA Inhale 1-2 puffs into the lungs every 6 (six) hours as needed for wheezing or shortness of breath.   Azelastine HCl 137 MCG/SPRAY Soln Place 1 spray into both nostrils 2 (two) times daily as needed (allergies).   baclofen 10 MG tablet Commonly known as: LIORESAL Take 10 mg by mouth 2 (two) times daily as needed for muscle spasms. LIMIT 2 DAYS PER WEEK   buPROPion  100 MG tablet Commonly known as: WELLBUTRIN TAKE 1 TABLET BY MOUTH TWICE A DAY   celecoxib 200 MG capsule Commonly known as: CELEBREX Take 1 capsule (200 mg total) by mouth 2 (two) times daily.   doxycycline 100 MG tablet Commonly known as: VIBRA-TABS Take 100 mg by mouth daily.   enoxaparin 40 MG/0.4ML injection Commonly known as: LOVENOX Inject 0.4 mLs (40 mg total) into the skin daily for 14 days.   escitalopram 20 MG tablet Commonly known as: LEXAPRO TAKE 1 TABLET BY MOUTH EVERY DAY   Excedrin Migraine 250-250-65 MG tablet Generic drug: aspirin-acetaminophen-caffeine Take 2 tablets by mouth every 8 (eight) hours as needed for headache.   gabapentin 600 MG tablet Commonly known as: NEURONTIN Take 600 mg by mouth 2 (two) times daily.   hydrochlorothiazide 12.5 MG capsule Commonly known as: MICROZIDE Take by mouth.   multivitamin with minerals Tabs tablet Take 1 tablet by mouth daily.   omeprazole 40 MG capsule Commonly known as: PRILOSEC TAKE 1 CAPSULE BY MOUTH EVERY DAY What changed: how much to take   oxyCODONE 5 MG immediate release tablet Commonly known as: Oxy IR/ROXICODONE Take 1 tablet (5 mg total) by mouth every 4 (four) hours as needed for moderate pain (pain score  4-6).   Potassium Chloride 40 MEQ/15ML (20%) Soln Take 40 mEq by mouth daily.   Qudexy XR 150 MG Cs24 sprinkle capsule Generic drug: topiramate ER Take 150 mg by mouth daily.   rizatriptan 10 MG tablet Commonly known as: MAXALT Take 10 mg by mouth as needed for migraine.   torsemide 20 MG tablet Commonly known as: [place] 2 TABLETS BY MOUTH EVERY DAY            Durable Medical Equipment  (From admission, onward)         Start     Ordered   05/02/19 1844  DME Walker rolling  Once    Question:  Patient needs a walker to treat with the following condition  Answer:  Total knee replacement status   05/02/19 1844   05/02/19 1844  DME Bedside commode  Once    Question:   Patient needs a bedside commode to treat with the following condition  Answer:  Total knee replacement status   05/02/19 1844          Disposition: Home with home health therapy    Follow-up Information    [facility] On 05/15/2019.   Specialty: Orthopedic Surgery Why: at 2:15pm Contact information: [place] [address] [phone]        Dereck Leep, MD On 06/14/2019.   Specialty: Orthopedic Surgery Why: at 9:15am Contact information: [place] [address] [place], PA-C 05/04/2019, 8:02 AM

## 2019-05-04 NOTE — Progress Notes (Signed)
Physical Therapy Treatment Patient Details Name: Rachael Brewer MRN: [id number] DOB: 19-Jun-1961 Today's Date: 05/04/2019    History of Present Illness Rachael Brewer is a 12yoF who comes to [facility] on 05/02/19 for Elective Rt TKA. PMH: OSA, preDM, migraine HA, GERD, GAD.    PT Comments    Pt in bed upon entry, breakfast completed in full. Pt agreeable to participate albeit she is awaiting pain meds. Myriad successes this session, pt continues to easily circumnavigate the unit and with gait speed empirically associated with limited community distances. Pt also able to perform entire HEP with modified independence after education on self assist techniques with bed sheet. Pt given more cues this date for gait quality, as her higher pain levels create more antalgia in movement, however pt still able to achieve a step-through gait bilat. Pt able to complete stairs training with success this date.    Follow Up Recommendations  Follow surgeon's recommendation for DC plan and follow-up therapies;Supervision - Intermittent;Home health PT     Equipment Recommendations  Rolling walker with 5" wheels;3in1 (PT)    Recommendations for Other Services       Precautions / Restrictions Precautions Precautions: Fall;Knee Precaution Booklet Issued: Yes (comment) Restrictions Weight Bearing Restrictions: Yes RLE Weight Bearing: Weight bearing as tolerated    Mobility  Bed Mobility Overal bed mobility: Modified Independent Bed Mobility: Supine to Sit     Supine to sit: Modified independent (Device/Increase time)        Transfers Overall transfer level: Needs assistance Equipment used: Rolling walker (2 wheeled) Transfers: Sit to/from Stand Sit to Stand: Modified independent (Device/Increase time)            Ambulation/Gait Ambulation/Gait assistance: Supervision Gait Distance (Feet): 240 Feet Assistive device: Rolling walker (2 wheeled) Gait Pattern/deviations: Step-to  pattern Gait velocity: 0.56ms  (0.580m yesterday PM ;<0.2057myesterday AM) Gait velocity interpretation: 1.31 - 2.62 ft/sec, indicative of limited community ambulator General Gait Details: Good demonstration of gait cues given this morning without reminder. Able to achieve a step-through gait this afternoon.   Stairs Stairs: Yes Stairs assistance: Supervision;Min guard Stair Management: No rails;One rail Left Number of Stairs: 4 General stair comments: last stair descending performed with BUE support (hand held assist) as pt has no railings at home for 1 step at entry   Wheelchair Mobility    Modified Rankin (Stroke Patients Only)       Balance Overall balance assessment: Modified Independent;No apparent balance deficits (not formally assessed)                                          Cognition Arousal/Alertness: Awake/alert Behavior During Therapy: WFL for tasks assessed/performed Overall Cognitive Status: Within Functional Limits for tasks assessed                                        Exercises Total Joint Exercises Ankle Circles/Pumps: AROM;20 reps;Supine;Both Quad Sets: AROM;Both;10 reps;Supine Short Arc Quad: AROM;Right;10 reps;Supine Heel Slides: AAROM;Right;Supine;15 reps Hip ABduction/ADduction: Right;15 reps;Supine;AAROM Rt knee flexion P/ROM: 7-96 degrees   General Comments        Pertinent Vitals/Pain Pain Assessment: 0-10 Pain Score: 7 (awaiting meds, pain worse after walking) Pain Location: operative knee Pain Descriptors / Indicators: Aching Pain Intervention(s): Limited activity within patient's tolerance;Monitored during session;Premedicated before  session;Repositioned;Patient requesting pain meds-RN notified    Home Living                      Prior Function            PT Goals (current goals can now be found in the care plan section) Acute Rehab PT Goals Patient Stated Goal: regain  independence in ADls PT Goal Formulation: With patient Time For Goal Achievement: 05/17/19 Potential to Achieve Goals: Good Progress towards PT goals: Progressing toward goals    Frequency    BID      PT Plan Current plan remains appropriate    Co-evaluation              AM-PAC PT "6 Clicks" Mobility   Outcome Measure  Help needed turning from your back to your side while in a flat bed without using bedrails?: None Help needed moving from lying on your back to sitting on the side of a flat bed without using bedrails?: None Help needed moving to and from a bed to a chair (including a wheelchair)?: None Help needed standing up from a chair using your arms (e.g., wheelchair or bedside chair)?: None Help needed to walk in hospital room?: A Little Help needed climbing 3-5 steps with a railing? : A Little 6 Click Score: 22    End of Session Equipment Utilized During Treatment: Gait belt Activity Tolerance: Patient tolerated treatment well;No increased pain Patient left: with chair alarm set;with call bell/phone within reach;with SCD's reapplied;in bed Nurse Communication: Mobility status PT Visit Diagnosis: Unsteadiness on feet (R26.81);Other abnormalities of gait and mobility (R26.89)     Time: 1017-5102 PT Time Calculation (min) (ACUTE ONLY): 27 min  Charges:  $Gait Training: 8-22 mins $Therapeutic Exercise: 8-22 mins               9:17 AM, 05/04/19 Etta Grandchild, PT, DPT Physical Therapist - [facility]  [phone] ([place])    Tajuan Dufault C 05/04/2019, 9:13 AM

## 2019-05-05 DIAGNOSIS — E669 Obesity, unspecified: Secondary | ICD-10-CM | POA: Diagnosis not present

## 2019-05-05 DIAGNOSIS — K219 Gastro-esophageal reflux disease without esophagitis: Secondary | ICD-10-CM | POA: Diagnosis not present

## 2019-05-05 DIAGNOSIS — I10 Essential (primary) hypertension: Secondary | ICD-10-CM | POA: Diagnosis not present

## 2019-05-05 DIAGNOSIS — I872 Venous insufficiency (chronic) (peripheral): Secondary | ICD-10-CM | POA: Diagnosis not present

## 2019-05-05 DIAGNOSIS — I890 Lymphedema, not elsewhere classified: Secondary | ICD-10-CM | POA: Diagnosis not present

## 2019-05-05 DIAGNOSIS — E7800 Pure hypercholesterolemia, unspecified: Secondary | ICD-10-CM | POA: Diagnosis not present

## 2019-05-05 DIAGNOSIS — F339 Major depressive disorder, recurrent, unspecified: Secondary | ICD-10-CM | POA: Diagnosis not present

## 2019-05-05 DIAGNOSIS — G4730 Sleep apnea, unspecified: Secondary | ICD-10-CM | POA: Diagnosis not present

## 2019-05-05 DIAGNOSIS — G43909 Migraine, unspecified, not intractable, without status migrainosus: Secondary | ICD-10-CM | POA: Diagnosis not present

## 2019-05-05 DIAGNOSIS — Z471 Aftercare following joint replacement surgery: Secondary | ICD-10-CM | POA: Diagnosis not present

## 2019-05-05 DIAGNOSIS — R7303 Prediabetes: Secondary | ICD-10-CM | POA: Diagnosis not present

## 2019-05-05 DIAGNOSIS — H814 Vertigo of central origin: Secondary | ICD-10-CM | POA: Diagnosis not present

## 2019-05-05 DIAGNOSIS — Z7901 Long term (current) use of anticoagulants: Secondary | ICD-10-CM | POA: Diagnosis not present

## 2019-05-05 DIAGNOSIS — Z6839 Body mass index (BMI) 39.0-39.9, adult: Secondary | ICD-10-CM | POA: Diagnosis not present

## 2019-05-05 DIAGNOSIS — F419 Anxiety disorder, unspecified: Secondary | ICD-10-CM | POA: Diagnosis not present

## 2019-05-05 DIAGNOSIS — Z96651 Presence of right artificial knee joint: Secondary | ICD-10-CM | POA: Diagnosis not present

## 2019-05-07 DIAGNOSIS — H814 Vertigo of central origin: Secondary | ICD-10-CM | POA: Diagnosis not present

## 2019-05-07 DIAGNOSIS — Z96651 Presence of right artificial knee joint: Secondary | ICD-10-CM | POA: Diagnosis not present

## 2019-05-07 DIAGNOSIS — E669 Obesity, unspecified: Secondary | ICD-10-CM | POA: Diagnosis not present

## 2019-05-07 DIAGNOSIS — F419 Anxiety disorder, unspecified: Secondary | ICD-10-CM | POA: Diagnosis not present

## 2019-05-07 DIAGNOSIS — I890 Lymphedema, not elsewhere classified: Secondary | ICD-10-CM | POA: Diagnosis not present

## 2019-05-07 DIAGNOSIS — G4733 Obstructive sleep apnea (adult) (pediatric): Secondary | ICD-10-CM | POA: Diagnosis not present

## 2019-05-07 DIAGNOSIS — K219 Gastro-esophageal reflux disease without esophagitis: Secondary | ICD-10-CM | POA: Diagnosis not present

## 2019-05-07 DIAGNOSIS — F339 Major depressive disorder, recurrent, unspecified: Secondary | ICD-10-CM | POA: Diagnosis not present

## 2019-05-07 DIAGNOSIS — G4730 Sleep apnea, unspecified: Secondary | ICD-10-CM | POA: Diagnosis not present

## 2019-05-07 DIAGNOSIS — I10 Essential (primary) hypertension: Secondary | ICD-10-CM | POA: Diagnosis not present

## 2019-05-07 DIAGNOSIS — Z6839 Body mass index (BMI) 39.0-39.9, adult: Secondary | ICD-10-CM | POA: Diagnosis not present

## 2019-05-07 DIAGNOSIS — Z7901 Long term (current) use of anticoagulants: Secondary | ICD-10-CM | POA: Diagnosis not present

## 2019-05-07 DIAGNOSIS — G43909 Migraine, unspecified, not intractable, without status migrainosus: Secondary | ICD-10-CM | POA: Diagnosis not present

## 2019-05-07 DIAGNOSIS — E7800 Pure hypercholesterolemia, unspecified: Secondary | ICD-10-CM | POA: Diagnosis not present

## 2019-05-07 DIAGNOSIS — R7303 Prediabetes: Secondary | ICD-10-CM | POA: Diagnosis not present

## 2019-05-07 DIAGNOSIS — I872 Venous insufficiency (chronic) (peripheral): Secondary | ICD-10-CM | POA: Diagnosis not present

## 2019-05-07 DIAGNOSIS — Z471 Aftercare following joint replacement surgery: Secondary | ICD-10-CM | POA: Diagnosis not present

## 2019-05-08 DIAGNOSIS — G43909 Migraine, unspecified, not intractable, without status migrainosus: Secondary | ICD-10-CM | POA: Diagnosis not present

## 2019-05-08 DIAGNOSIS — Z96651 Presence of right artificial knee joint: Secondary | ICD-10-CM | POA: Diagnosis not present

## 2019-05-08 DIAGNOSIS — I872 Venous insufficiency (chronic) (peripheral): Secondary | ICD-10-CM | POA: Diagnosis not present

## 2019-05-08 DIAGNOSIS — E669 Obesity, unspecified: Secondary | ICD-10-CM | POA: Diagnosis not present

## 2019-05-08 DIAGNOSIS — R7303 Prediabetes: Secondary | ICD-10-CM | POA: Diagnosis not present

## 2019-05-08 DIAGNOSIS — F339 Major depressive disorder, recurrent, unspecified: Secondary | ICD-10-CM | POA: Diagnosis not present

## 2019-05-08 DIAGNOSIS — G4730 Sleep apnea, unspecified: Secondary | ICD-10-CM | POA: Diagnosis not present

## 2019-05-08 DIAGNOSIS — E7800 Pure hypercholesterolemia, unspecified: Secondary | ICD-10-CM | POA: Diagnosis not present

## 2019-05-08 DIAGNOSIS — Z6839 Body mass index (BMI) 39.0-39.9, adult: Secondary | ICD-10-CM | POA: Diagnosis not present

## 2019-05-08 DIAGNOSIS — I890 Lymphedema, not elsewhere classified: Secondary | ICD-10-CM | POA: Diagnosis not present

## 2019-05-08 DIAGNOSIS — Z7901 Long term (current) use of anticoagulants: Secondary | ICD-10-CM | POA: Diagnosis not present

## 2019-05-08 DIAGNOSIS — I10 Essential (primary) hypertension: Secondary | ICD-10-CM | POA: Diagnosis not present

## 2019-05-08 DIAGNOSIS — K219 Gastro-esophageal reflux disease without esophagitis: Secondary | ICD-10-CM | POA: Diagnosis not present

## 2019-05-08 DIAGNOSIS — F419 Anxiety disorder, unspecified: Secondary | ICD-10-CM | POA: Diagnosis not present

## 2019-05-08 DIAGNOSIS — Z471 Aftercare following joint replacement surgery: Secondary | ICD-10-CM | POA: Diagnosis not present

## 2019-05-08 DIAGNOSIS — H814 Vertigo of central origin: Secondary | ICD-10-CM | POA: Diagnosis not present

## 2019-05-10 DIAGNOSIS — I10 Essential (primary) hypertension: Secondary | ICD-10-CM | POA: Diagnosis not present

## 2019-05-10 DIAGNOSIS — I890 Lymphedema, not elsewhere classified: Secondary | ICD-10-CM | POA: Diagnosis not present

## 2019-05-10 DIAGNOSIS — G43909 Migraine, unspecified, not intractable, without status migrainosus: Secondary | ICD-10-CM | POA: Diagnosis not present

## 2019-05-10 DIAGNOSIS — F339 Major depressive disorder, recurrent, unspecified: Secondary | ICD-10-CM | POA: Diagnosis not present

## 2019-05-10 DIAGNOSIS — R7303 Prediabetes: Secondary | ICD-10-CM | POA: Diagnosis not present

## 2019-05-10 DIAGNOSIS — F419 Anxiety disorder, unspecified: Secondary | ICD-10-CM | POA: Diagnosis not present

## 2019-05-10 DIAGNOSIS — E7800 Pure hypercholesterolemia, unspecified: Secondary | ICD-10-CM | POA: Diagnosis not present

## 2019-05-10 DIAGNOSIS — I872 Venous insufficiency (chronic) (peripheral): Secondary | ICD-10-CM | POA: Diagnosis not present

## 2019-05-10 DIAGNOSIS — H814 Vertigo of central origin: Secondary | ICD-10-CM | POA: Diagnosis not present

## 2019-05-10 DIAGNOSIS — K219 Gastro-esophageal reflux disease without esophagitis: Secondary | ICD-10-CM | POA: Diagnosis not present

## 2019-05-10 DIAGNOSIS — Z96651 Presence of right artificial knee joint: Secondary | ICD-10-CM | POA: Diagnosis not present

## 2019-05-10 DIAGNOSIS — Z6839 Body mass index (BMI) 39.0-39.9, adult: Secondary | ICD-10-CM | POA: Diagnosis not present

## 2019-05-10 DIAGNOSIS — E669 Obesity, unspecified: Secondary | ICD-10-CM | POA: Diagnosis not present

## 2019-05-10 DIAGNOSIS — Z7901 Long term (current) use of anticoagulants: Secondary | ICD-10-CM | POA: Diagnosis not present

## 2019-05-10 DIAGNOSIS — Z471 Aftercare following joint replacement surgery: Secondary | ICD-10-CM | POA: Diagnosis not present

## 2019-05-10 DIAGNOSIS — G4730 Sleep apnea, unspecified: Secondary | ICD-10-CM | POA: Diagnosis not present

## 2019-05-14 DIAGNOSIS — E7800 Pure hypercholesterolemia, unspecified: Secondary | ICD-10-CM | POA: Diagnosis not present

## 2019-05-14 DIAGNOSIS — Z471 Aftercare following joint replacement surgery: Secondary | ICD-10-CM | POA: Diagnosis not present

## 2019-05-14 DIAGNOSIS — G43909 Migraine, unspecified, not intractable, without status migrainosus: Secondary | ICD-10-CM | POA: Diagnosis not present

## 2019-05-14 DIAGNOSIS — H814 Vertigo of central origin: Secondary | ICD-10-CM | POA: Diagnosis not present

## 2019-05-14 DIAGNOSIS — Z96651 Presence of right artificial knee joint: Secondary | ICD-10-CM | POA: Diagnosis not present

## 2019-05-14 DIAGNOSIS — E669 Obesity, unspecified: Secondary | ICD-10-CM | POA: Diagnosis not present

## 2019-05-14 DIAGNOSIS — I890 Lymphedema, not elsewhere classified: Secondary | ICD-10-CM | POA: Diagnosis not present

## 2019-05-14 DIAGNOSIS — I10 Essential (primary) hypertension: Secondary | ICD-10-CM | POA: Diagnosis not present

## 2019-05-14 DIAGNOSIS — K219 Gastro-esophageal reflux disease without esophagitis: Secondary | ICD-10-CM | POA: Diagnosis not present

## 2019-05-14 DIAGNOSIS — G4730 Sleep apnea, unspecified: Secondary | ICD-10-CM | POA: Diagnosis not present

## 2019-05-14 DIAGNOSIS — Z6839 Body mass index (BMI) 39.0-39.9, adult: Secondary | ICD-10-CM | POA: Diagnosis not present

## 2019-05-14 DIAGNOSIS — F419 Anxiety disorder, unspecified: Secondary | ICD-10-CM | POA: Diagnosis not present

## 2019-05-14 DIAGNOSIS — F339 Major depressive disorder, recurrent, unspecified: Secondary | ICD-10-CM | POA: Diagnosis not present

## 2019-05-14 DIAGNOSIS — R7303 Prediabetes: Secondary | ICD-10-CM | POA: Diagnosis not present

## 2019-05-14 DIAGNOSIS — Z7901 Long term (current) use of anticoagulants: Secondary | ICD-10-CM | POA: Diagnosis not present

## 2019-05-14 DIAGNOSIS — I872 Venous insufficiency (chronic) (peripheral): Secondary | ICD-10-CM | POA: Diagnosis not present

## 2019-05-15 ENCOUNTER — Telehealth: Payer: Self-pay | Admitting: Podiatry

## 2019-05-15 DIAGNOSIS — Z96651 Presence of right artificial knee joint: Secondary | ICD-10-CM | POA: Diagnosis not present

## 2019-05-15 NOTE — Telephone Encounter (Signed)
Called pt and got her scheduled for surgery on Friday 06 July 2019. Told pt she could go ahead and register online with the surgical center or they would call and get the information over the phone. Also told pt to go ahead and contact her PCP's office to schedule the pre-op appointment with them so we can get her medical clearance for her surgery. Gave pt my direct number to call me or told her to message me via MyChart with any questions/concerns she may have before surgery.

## 2019-05-15 NOTE — Telephone Encounter (Signed)
I'm calling to schedule surgery for my left ankle at the beginning of the new year. I would like the first or second week of January 2021.

## 2019-05-21 DIAGNOSIS — Z96651 Presence of right artificial knee joint: Secondary | ICD-10-CM | POA: Diagnosis not present

## 2019-05-23 DIAGNOSIS — Z96651 Presence of right artificial knee joint: Secondary | ICD-10-CM | POA: Diagnosis not present

## 2019-05-29 DIAGNOSIS — G43839 Menstrual migraine, intractable, without status migrainosus: Secondary | ICD-10-CM | POA: Diagnosis not present

## 2019-05-29 DIAGNOSIS — G43719 Chronic migraine without aura, intractable, without status migrainosus: Secondary | ICD-10-CM | POA: Diagnosis not present

## 2019-05-29 DIAGNOSIS — G43019 Migraine without aura, intractable, without status migrainosus: Secondary | ICD-10-CM | POA: Diagnosis not present

## 2019-05-29 DIAGNOSIS — Z96651 Presence of right artificial knee joint: Secondary | ICD-10-CM | POA: Diagnosis not present

## 2019-05-31 DIAGNOSIS — Z96651 Presence of right artificial knee joint: Secondary | ICD-10-CM | POA: Diagnosis not present

## 2019-06-04 DIAGNOSIS — Z96651 Presence of right artificial knee joint: Secondary | ICD-10-CM | POA: Diagnosis not present

## 2019-06-07 ENCOUNTER — Other Ambulatory Visit: Payer: Self-pay | Admitting: Family Medicine

## 2019-06-07 DIAGNOSIS — K219 Gastro-esophageal reflux disease without esophagitis: Secondary | ICD-10-CM

## 2019-06-07 NOTE — Telephone Encounter (Signed)
Requested medication (s) are due for refill today: yes  Requested medication (s) are on the active medication list: yes  Last refill:  04/24/2019  Future visit scheduled:yes  Notes to clinic:  patient has upcoming appointment Overdue for followup   Requested Prescriptions  Pending Prescriptions Disp Refills   omeprazole (PRILOSEC) 40 MG capsule [Pharmacy Med Name: OMEPRAZOLE DR 40 MG CAPSULE] 90 capsule 15    Sig: TAKE 1 [place]      Gastroenterology: Proton Pump Inhibitors Failed - 06/07/2019  4:33 AM      Failed - Valid encounter within last 12 months    Recent Outpatient Visits           1 year ago Depression, recurrent [facility])   [place] [facility], Dionne Bucy, MD   1 year ago Depression, recurrent [facility])   [facility], Dionne Bucy, MD   1 year ago Fatigue, unspecified type   [facility], Dionne Bucy, MD   1 year ago Depression, recurrent [facility])   [facility] [place], Dionne Bucy, MD   1 year ago Asthmatic bronchitis with acute exacerbation, unspecified asthma severity, unspecified whether persistent   [facility], Dionne Bucy, MD       Future Appointments             In 3 weeks Bacigalupo, Dionne Bucy, MD [facility], [facility]

## 2019-06-08 ENCOUNTER — Other Ambulatory Visit: Payer: Self-pay | Admitting: Family Medicine

## 2019-06-08 DIAGNOSIS — R609 Edema, unspecified: Secondary | ICD-10-CM

## 2019-06-08 DIAGNOSIS — Z96651 Presence of right artificial knee joint: Secondary | ICD-10-CM | POA: Diagnosis not present

## 2019-06-08 NOTE — Telephone Encounter (Signed)
Requested medication (s) are due for refill today: yes  Requested medication (s) are on the active medication list:yes  Last refill: 04/27/2018  Future visit scheduled: yes  Notes to clinic: review for refill Patient has upcoming appointment  Overdue for 6 month follow up   Requested Prescriptions  Pending Prescriptions Disp Refills   torsemide (DEMADEX) 20 MG tablet [Pharmacy Med Name: TORSEMIDE 20 MG TABLET] 60 tablet 29    Sig: TAKE 2 TABLETS BY MOUTH EVERY DAY      Cardiovascular:  Diuretics - Loop Failed - 06/08/2019  3:27 AM      Failed - Valid encounter within last 6 months    Recent Outpatient Visits           1 year ago Depression, recurrent ([place])   [facility] [place], Dionne Bucy, MD   1 year ago Depression, recurrent [facility])   [facility] [place], Dionne Bucy, MD   1 year ago Fatigue, unspecified type   [facility], Dionne Bucy, MD   1 year ago Depression, recurrent [facility])   [facility] [place], Dionne Bucy, MD   1 year ago Asthmatic bronchitis with acute exacerbation, unspecified asthma severity, unspecified whether persistent   [facility], Dionne Bucy, MD       Future Appointments             In 2 weeks Bacigalupo, Dionne Bucy, MD [facility], [facility]            Passed - K in normal range and within 360 days    Potassium  Date Value Ref Range Status  04/23/2019 3.5 3.5 - 5.1 mmol/L Final          Passed - Ca in normal range and within 360 days    Calcium  Date Value Ref Range Status  04/23/2019 9.0 8.9 - 10.3 mg/dL Final          Passed - Na in normal range and within 360 days    Sodium  Date Value Ref Range Status  04/23/2019 140 135 - 145 mmol/L Final  02/09/2018 142 134 - 144 mmol/L Final          Passed - Cr in normal range and within 360 days    Creat  Date Value Ref Range Status  05/13/2017 0.92 0.50 - 1.05 mg/dL Final    Comment:     For patients >9 years of age, the reference limit for Creatinine is approximately 13% higher for people identified as African-American. .    Creatinine, Ser  Date Value Ref Range Status  04/23/2019 0.93 0.44 - 1.00 mg/dL Final          Passed - Last BP in normal range    BP Readings from Last 1 Encounters:  05/04/19 (!) 113/57            escitalopram (LEXAPRO) 20 MG tablet [Pharmacy Med Name: ESCITALOPRAM 20 MG TABLET] 30 tablet 3    Sig: TAKE 1 TABLET BY MOUTH EVERY DAY      Psychiatry:  Antidepressants - SSRI Failed - 06/08/2019  3:27 AM      Failed - Valid encounter within last 6 months    Recent Outpatient Visits           1 year ago Depression, recurrent [facility])   [facility], Dionne Bucy, MD   1 year ago Depression, recurrent [facility])   [facility], Dionne Bucy, MD  1 year ago Fatigue, unspecified type   [facility], Dionne Bucy, MD   1 year ago Depression, recurrent [facility])   [place], Dionne Bucy, MD   1 year ago Asthmatic bronchitis with acute exacerbation, unspecified asthma severity, unspecified whether persistent   [facility], Dionne Bucy, MD       Future Appointments             In 2 weeks Bacigalupo, Dionne Bucy, MD [facility], [facility]            Passed - Completed PHQ-2 or PHQ-9 in the last 360 days.

## 2019-06-11 DIAGNOSIS — Z96651 Presence of right artificial knee joint: Secondary | ICD-10-CM | POA: Diagnosis not present

## 2019-06-18 DIAGNOSIS — M25562 Pain in left knee: Secondary | ICD-10-CM | POA: Diagnosis not present

## 2019-06-25 ENCOUNTER — Telehealth: Payer: Self-pay | Admitting: Podiatry

## 2019-06-25 NOTE — Telephone Encounter (Signed)
Called pt to see if her insurance had changed for the new year. Pt stated everything should still be the same. I told her on both Epic real time eligibility and the [employer] its showing inactive. Pt is going to call [employer] and call me back with the information.

## 2019-06-26 ENCOUNTER — Telehealth: Payer: Self-pay | Admitting: Podiatry

## 2019-06-26 NOTE — Telephone Encounter (Signed)
I'm calling to give you the update insurance information for the new year on Rachael Brewer. You can call us back on the provider line at [phone].

## 2019-06-26 NOTE — Telephone Encounter (Addendum)
DOS: 09/07/2019  SURGICAL PROCEDURES: Endoscopic Plantar Fasciotomy [id number]), Tenosynovectomy Posterior Tibial Tendon [id number]), Sinus Tarsi Exploration [id number]), and Possible Cast Application Left  BCBS Policy Effective : [id number]  -  06/20/2198  Member Liability Summary  In-Network   Max Per Benefit Period Year-to-Date Remaining     CoInsurance 20%       Deductible  $750.00 $750.00     Out-Of-Pocket 3 $3000.00 $[phone] 3 Out-of-Pocket includes copay, deductible, and coinsurance.  [place] Required Not Applicable [id number]  per  Service Year No Messages: [place] liability. Not Applicable [id number]  per  Service Year No

## 2019-06-28 ENCOUNTER — Other Ambulatory Visit: Payer: Self-pay

## 2019-06-28 ENCOUNTER — Ambulatory Visit: Payer: BC Managed Care – PPO | Admitting: Family Medicine

## 2019-06-28 DIAGNOSIS — U071 COVID-19: Secondary | ICD-10-CM | POA: Diagnosis not present

## 2019-06-28 NOTE — Progress Notes (Deleted)
Patient: Rachael Brewer Female    DOB: 02-29-1961   59 y.o.   MRN: [id number] Visit Date: 06/28/2019  Today's Provider: Lavon Paganini, MD   No chief complaint on file.  Subjective:     HPI  Depression, Follow-up  She  was last seen for this 1 years ago. Changes made at last visit include increase Wellbutrin to 100mg  BID.   She reports {excellent/good/fair/poor:19665} compliance with treatment. She {ACTION; IS/IS VG:[id number] having side effects. ***  She reports {DESC; GOOD/FAIR/POOR:18685} tolerance of treatment. Current symptoms include: {Symptoms; depression:1002} She feels she is {improved/worse/unchanged:3041574} since last visit.  ------------------------------------------------------------------------   Allergies  Allergen Reactions  . Bactrim [Sulfamethoxazole-Trimethoprim] Shortness Of Breath and Swelling  . Sulfa Antibiotics Anaphylaxis  . Penicillins Hives    Did it involve swelling of the face/tongue/throat, SOB, or low BP? No Did it involve sudden or severe rash/hives, skin peeling, or any reaction on the inside of your mouth or nose? No Did you need to seek medical attention at a hospital or doctor's office? No When did it last happen?10 years ago If all above answers are "NO", may proceed with cephalosporin use.   . Sumatriptan     Made migraine more intense and scalp felt like it was on fire     Current Outpatient Medications:  .  escitalopram (LEXAPRO) 20 MG tablet, TAKE 1 TABLET BY MOUTH EVERY DAY, Disp: 30 tablet, Rfl: 3 .  omeprazole (PRILOSEC) 40 MG capsule, Take 1 capsule (40 mg total) by mouth daily., Disp: 90 capsule, Rfl: 2 .  torsemide (DEMADEX) 20 MG tablet, TAKE 2 TABLETS BY MOUTH EVERY DAY, Disp: 60 tablet, Rfl: 3 .  albuterol (VENTOLIN HFA) 108 (90 Base) MCG/ACT inhaler, Inhale 1-2 puffs into the lungs every 6 (six) hours as needed for wheezing or shortness of breath., Disp: 1 Inhaler, Rfl: 3 .   aspirin-acetaminophen-caffeine (EXCEDRIN MIGRAINE) 250-250-65 MG tablet, Take 2 tablets by mouth every 8 (eight) hours as needed for headache. , Disp: , Rfl:  .  Azelastine HCl 137 MCG/SPRAY SOLN, Place 1 spray into both nostrils 2 (two) times daily as needed (allergies). , Disp: , Rfl:  .  baclofen (LIORESAL) 10 MG tablet, Take 10 mg by mouth 2 (two) times daily as needed for muscle spasms. LIMIT 2 DAYS PER WEEK, Disp: , Rfl:  .  buPROPion (WELLBUTRIN) 100 MG tablet, TAKE 1 TABLET BY MOUTH TWICE A DAY (Patient taking differently: Take 100 mg by mouth 2 (two) times daily. ), Disp: 180 tablet, Rfl: 1 .  celecoxib (CELEBREX) 200 MG capsule, Take 1 capsule (200 mg total) by mouth 2 (two) times daily., Disp: 84 capsule, Rfl: 0 .  doxycycline (VIBRA-TABS) 100 MG tablet, Take 100 mg by mouth daily., Disp: , Rfl:  .  enoxaparin (LOVENOX) 40 MG/0.4ML injection, Inject 0.4 mLs (40 mg total) into the skin daily for 14 days., Disp: 5.6 mL, Rfl: 0 .  gabapentin (NEURONTIN) 600 MG tablet, Take 600 mg by mouth 2 (two) times daily. , Disp: , Rfl:  .  hydrochlorothiazide (MICROZIDE) 12.5 MG capsule, Take by mouth. , Disp: , Rfl: 3 .  Multiple Vitamin (MULTIVITAMIN WITH MINERALS) TABS tablet, Take 1 tablet by mouth daily., Disp: , Rfl:  .  oxyCODONE (OXY IR/ROXICODONE) 5 MG immediate release tablet, Take 1 tablet (5 mg total) by mouth every 4 (four) hours as needed for moderate pain (pain score 4-6)., Disp: 30 tablet, Rfl: 0 .  Potassium Chloride 40 MEQ/15ML (20%)  SOLN, Take 40 mEq by mouth daily., Disp: 473 mL, Rfl: 3 .  rizatriptan (MAXALT) 10 MG tablet, Take 10 mg by mouth as needed for migraine. , Disp: , Rfl: 0 .  topiramate ER (QUDEXY XR) 150 MG CS24 sprinkle capsule, Take 150 mg by mouth daily., Disp: , Rfl:   Review of Systems  Social History   Tobacco Use  . Smoking status: Former Smoker    Packs/day: 1.00    Years: 15.00    Pack years: 15.00    Quit date: 06/21/1999    Years since quitting: 20.0    . Smokeless tobacco: Never Used  Substance Use Topics  . Alcohol use: Yes    Alcohol/week: 0.0 standard drinks    Comment: occasional      Objective:   LMP  (LMP Unknown)  There were no vitals filed for this visit.There is no height or weight on file to calculate BMI.   Physical Exam   No results found for any visits on 06/28/19.     Assessment & Plan        Lavon Paganini, MD  [facility]

## 2019-07-01 DIAGNOSIS — Z471 Aftercare following joint replacement surgery: Secondary | ICD-10-CM | POA: Diagnosis not present

## 2019-07-04 ENCOUNTER — Telehealth: Payer: Self-pay | Admitting: Podiatry

## 2019-07-04 NOTE — Telephone Encounter (Signed)
Pt states she tested positive for COVID last Thursday after she told her doctor she was short of breath and was wheezing. Pt is also having knee surgery this coming Monday, 07/09/19. Pt requested to resch her sx 8 weeks after that sx. I have rescheduled pt's sx date on Dr. Stephenie Acres schedule in both the sx book and in [place]. I am getting her postop visits rescheduled. I have also contacted Renee at [facility] to reschedule the surgery with them.

## 2019-07-05 ENCOUNTER — Other Ambulatory Visit: Payer: BC Managed Care – PPO

## 2019-07-06 ENCOUNTER — Other Ambulatory Visit: Payer: Self-pay

## 2019-07-06 ENCOUNTER — Encounter
Admission: RE | Admit: 2019-07-06 | Discharge: 2019-07-06 | Disposition: A | Discharge status: Home / Self Care | Payer: BC Managed Care – PPO | Source: Ambulatory Visit | Attending: Orthopedic Surgery | Admitting: Orthopedic Surgery

## 2019-07-06 ENCOUNTER — Encounter: Payer: Self-pay | Admitting: Orthopedic Surgery

## 2019-07-06 ENCOUNTER — Other Ambulatory Visit
Admission: RE | Admit: 2019-07-06 | Discharge: 2019-07-06 | Disposition: A | Discharge status: Home / Self Care | Payer: BC Managed Care – PPO | Source: Ambulatory Visit | Attending: Orthopedic Surgery | Admitting: Orthopedic Surgery

## 2019-07-06 DIAGNOSIS — M1712 Unilateral primary osteoarthritis, left knee: Secondary | ICD-10-CM | POA: Insufficient documentation

## 2019-07-06 DIAGNOSIS — Z01812 Encounter for preprocedural laboratory examination: Secondary | ICD-10-CM | POA: Diagnosis not present

## 2019-07-06 LAB — URINALYSIS, ROUTINE W REFLEX MICROSCOPIC
Bilirubin Urine: NEGATIVE
Glucose, UA: NEGATIVE mg/dL
Hgb urine dipstick: NEGATIVE
Ketones, ur: NEGATIVE mg/dL
Leukocytes,Ua: NEGATIVE
Nitrite: NEGATIVE
Protein, ur: NEGATIVE mg/dL
Specific Gravity, Urine: 1.005 (ref 1.005–1.030)
pH: 5 (ref 5.0–8.0)

## 2019-07-06 LAB — TYPE AND SCREEN
ABO/RH(D): A POS
Antibody Screen: NEGATIVE

## 2019-07-06 LAB — COMPREHENSIVE METABOLIC PANEL
ALT: 16 U/L (ref 0–44)
AST: 21 U/L (ref 15–41)
Albumin: 4.5 g/dL (ref 3.5–5.0)
Alkaline Phosphatase: 71 U/L (ref 38–126)
Anion gap: 10 (ref 5–15)
BUN: 22 mg/dL — ABNORMAL HIGH (ref 6–20)
CO2: 23 mmol/L (ref 22–32)
Calcium: 9.3 mg/dL (ref 8.9–10.3)
Chloride: 109 mmol/L (ref 98–111)
Creatinine, Ser: 0.93 mg/dL (ref 0.44–1.00)
GFR calc Af Amer: >60 mL/min (ref >60)
GFR calc non Af Amer: >60 mL/min (ref >60)
Glucose, Bld: 108 mg/dL — ABNORMAL HIGH (ref 70–99)
Potassium: 3.1 mmol/L — ABNORMAL LOW (ref 3.5–5.1)
Sodium: 142 mmol/L (ref 135–145)
Total Bilirubin: 0.5 mg/dL (ref 0.3–1.2)
Total Protein: 7.4 g/dL (ref 6.5–8.1)

## 2019-07-06 LAB — CBC
HCT: 43 % (ref 36.0–46.0)
Hemoglobin: 14.5 g/dL (ref 12.0–15.0)
MCH: 30.1 pg (ref 26.0–34.0)
MCHC: 33.7 g/dL (ref 30.0–36.0)
MCV: 89.4 fL (ref 80.0–100.0)
Platelets: 301 10*3/uL (ref 150–400)
RBC: 4.81 MIL/uL (ref 3.87–5.11)
RDW: 12.5 % (ref 11.5–15.5)
WBC: 7.7 10*3/uL (ref 4.0–10.5)
nRBC: 0 % (ref 0.0–0.2)

## 2019-07-06 LAB — SEDIMENTATION RATE: Sed Rate: 7 mm/hr (ref 0–30)

## 2019-07-06 LAB — APTT: aPTT: 32 seconds (ref 24–36)

## 2019-07-06 LAB — PROTIME-INR
INR: 1 (ref 0.8–1.2)
Prothrombin Time: 12.9 seconds (ref 11.4–15.2)

## 2019-07-06 LAB — SURGICAL PCR SCREEN
MRSA, PCR: NEGATIVE
Staphylococcus aureus: NEGATIVE

## 2019-07-06 LAB — C-REACTIVE PROTEIN: CRP: 1.8 mg/dL — ABNORMAL HIGH (ref <1.0)

## 2019-07-06 NOTE — Patient Instructions (Addendum)
Your procedure is scheduled on:mon 1/18 Report to Day Surgery. At 10:45   Remember: Instructions that are not followed completely may result in serious medical risk,  up to and including death, or upon the discretion of your surgeon and anesthesiologist your  surgery may need to be rescheduled.     _X__ 1. Do not eat food after midnight the night before your procedure.                 No gum chewing or hard candies. You may drink clear liquids up to 2 hours                 before you are scheduled to arrive for your surgery- DO not drink clear                 liquids within 2 hours of the start of your surgery.                 Clear Liquids include:  water, apple juice without pulp, clear carbohydrate                 drink such as Clearfast of Gatorade, Black Coffee or Tea (Do not add                 anything to coffee or tea).  __X__2.  On the morning of surgery brush your teeth with toothpaste and water, you                may rinse your mouth with mouthwash if you wish.  Do not swallow any toothpaste of mouthwash.     _X__ 3.  No Alcohol for 24 hours before or after surgery.   ___ 4.  Do Not Smoke or use e-cigarettes For 24 Hours Prior to Your Surgery.                 Do not use any chewable tobacco products for at least 6 hours prior to                 surgery.  ____  5.  Bring all medications with you on the day of surgery if instructed.   _x___  6.  Notify your doctor if there is any change in your medical condition      (cold, fever, infections).     Do not wear jewelry, make-up, hairpins, clips or nail polish. Do not wear lotions, powders, or perfumes. You may wear deodorant. Do not shave 48 hours prior to surgery. Men may shave face and neck. Do not bring valuables to the hospital.    [facility] is not responsible for any belongings or valuables.  Contacts, dentures or bridgework may not be worn into surgery. Leave your suitcase in the car. After  surgery it may be brought to your room. For patients admitted to the hospital, discharge time is determined by your treatment team.   Patients discharged the day of surgery will not be allowed to drive home.   Please read over the following fact sheets that you were given:     __x__ Take these medicines the morning of surgery with A SIP OF WATER:    1. baclofen (LIORESAL) 10 MG tablet If needed  2. buPROPion (WELLBUTRIN) 100 MG tablet  3.escitalopram (LEXAPRO) 20 MG tablet   4.gabapentin (NEURONTIN) 600 MG tablet  5.omeprazole (PRILOSEC) 40 MG capsule the night before and the morning of surgery  6.oxyCODONE (OXY IR/ROXICODONE) 5 MG immediate release  tablet if needed  ____ Fleet Enema (as directed)   __x__ Use CHG Soap as directed  ___x_ Use inhalers on the day of surgeryalbuterol (VENTOLIN HFA) 108 (90 Base) MCG/ACT inhaler and bring with you  ____ Stop metformin 2 days prior to surgery    ____ Take 1/2 of usual insulin dose the night before surgery. No insulin the morning          of surgery.   ____ Stop Coumadin/Plavix/aspirin   __x__ Stop Anti-inflammatories aspirin-acetaminophen-caffeine (EXCEDRIN MIGRAINE) 250-250-65 MG tablet   ____ Stop supplements until after surgery.    ____ Bring C-Pap to the hospital.

## 2019-07-06 NOTE — Patient Instructions (Signed)
Your procedure is scheduled on:mon 1/18 Report to Day Surgery. At 10:45   Remember: Instructions that are not followed completely may result in serious medical risk,  up to and including death, or upon the discretion of your surgeon and anesthesiologist your  surgery may need to be rescheduled.     _X__ 1. Do not eat food after midnight the night before your procedure.                 No gum chewing or hard candies. You may drink clear liquids up to 2 hours                 before you are scheduled to arrive for your surgery- DO not drink clear                 liquids within 2 hours of the start of your surgery.                 Clear Liquids include:  water, apple juice without pulp, clear carbohydrate                 drink such as Clearfast of Gatorade, Black Coffee or Tea (Do not add                 anything to coffee or tea).  __X__2.  On the morning of surgery brush your teeth with toothpaste and water, you                may rinse your mouth with mouthwash if you wish.  Do not swallow any toothpaste of mouthwash.     _X__ 3.  No Alcohol for 24 hours before or after surgery.   ___ 4.  Do Not Smoke or use e-cigarettes For 24 Hours Prior to Your Surgery.                 Do not use any chewable tobacco products for at least 6 hours prior to                 surgery.  ____  5.  Bring all medications with you on the day of surgery if instructed.   _x___  6.  Notify your doctor if there is any change in your medical condition      (cold, fever, infections).     Do not wear jewelry, make-up, hairpins, clips or nail polish. Do not wear lotions, powders, or perfumes. You may wear deodorant. Do not shave 48 hours prior to surgery. Men may shave face and neck. Do not bring valuables to the hospital.    [facility] is not responsible for any belongings or valuables.  Contacts, dentures or bridgework may not be worn into surgery. Leave your suitcase in the car. After surgery it may be  brought to your room. For patients admitted to the hospital, discharge time is determined by your treatment team.   Patients discharged the day of surgery will not be allowed to drive home.   Please read over the following fact sheets that you were given:     __x__ Take these medicines the morning of surgery with A SIP OF WATER:    1. baclofen (LIORESAL) 10 MG tablet If needed  2. buPROPion (WELLBUTRIN) 100 MG tablet  3.escitalopram (LEXAPRO) 20 MG tablet   4.gabapentin (NEURONTIN) 600 MG tablet  5.omeprazole (PRILOSEC) 40 MG capsule the night before and the morning of surgery  6.oxyCODONE (OXY IR/ROXICODONE) 5 MG immediate release  tablet if needed  ____ Fleet Enema (as directed)   __x__ Use CHG Soap as directed  ___x_ Use inhalers on the day of surgeryalbuterol (VENTOLIN HFA) 108 (90 Base) MCG/ACT inhaler and bring with you  ____ Stop metformin 2 days prior to surgery    ____ Take 1/2 of usual insulin dose the night before surgery. No insulin the morning          of surgery.   ____ Stop Coumadin/Plavix/aspirin   __x__ Stop Anti-inflammatories aspirin-acetaminophen-caffeine (EXCEDRIN MIGRAINE) 250-250-65 MG tablet   ____ Stop supplements until after surgery.    ____ Bring C-Pap to the hospital.

## 2019-07-08 ENCOUNTER — Encounter: Payer: Self-pay | Admitting: Orthopedic Surgery

## 2019-07-08 LAB — URINE CULTURE
Culture: <10000 — AB
Special Requests: NORMAL
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
## 2019-07-09 ENCOUNTER — Ambulatory Visit
Admission: RE | Admit: 2019-07-09 | Discharge: 2019-07-09 | Disposition: A | Discharge status: Home / Self Care | Payer: BC Managed Care – PPO | Attending: Orthopedic Surgery | Admitting: Orthopedic Surgery

## 2019-07-09 ENCOUNTER — Ambulatory Visit: Payer: BC Managed Care – PPO | Admitting: Anesthesiology

## 2019-07-09 ENCOUNTER — Other Ambulatory Visit: Payer: Self-pay

## 2019-07-09 ENCOUNTER — Encounter: Payer: Self-pay | Admitting: Orthopedic Surgery

## 2019-07-09 ENCOUNTER — Ambulatory Visit: Payer: BC Managed Care – PPO

## 2019-07-09 ENCOUNTER — Encounter
Admission: RE | Disposition: A | Discharge status: Home / Self Care | Payer: Self-pay | Source: Home / Self Care | Attending: Orthopedic Surgery

## 2019-07-09 DIAGNOSIS — Z471 Aftercare following joint replacement surgery: Secondary | ICD-10-CM | POA: Diagnosis not present

## 2019-07-09 DIAGNOSIS — Z96659 Presence of unspecified artificial knee joint: Secondary | ICD-10-CM

## 2019-07-09 DIAGNOSIS — F419 Anxiety disorder, unspecified: Secondary | ICD-10-CM | POA: Diagnosis not present

## 2019-07-09 DIAGNOSIS — Z96652 Presence of left artificial knee joint: Secondary | ICD-10-CM | POA: Diagnosis not present

## 2019-07-09 DIAGNOSIS — F329 Major depressive disorder, single episode, unspecified: Secondary | ICD-10-CM | POA: Insufficient documentation

## 2019-07-09 DIAGNOSIS — Z79899 Other long term (current) drug therapy: Secondary | ICD-10-CM | POA: Diagnosis not present

## 2019-07-09 DIAGNOSIS — Z96651 Presence of right artificial knee joint: Secondary | ICD-10-CM | POA: Diagnosis not present

## 2019-07-09 DIAGNOSIS — J45909 Unspecified asthma, uncomplicated: Secondary | ICD-10-CM | POA: Insufficient documentation

## 2019-07-09 DIAGNOSIS — K219 Gastro-esophageal reflux disease without esophagitis: Secondary | ICD-10-CM | POA: Diagnosis not present

## 2019-07-09 DIAGNOSIS — I10 Essential (primary) hypertension: Secondary | ICD-10-CM | POA: Insufficient documentation

## 2019-07-09 DIAGNOSIS — Z87891 Personal history of nicotine dependence: Secondary | ICD-10-CM | POA: Diagnosis not present

## 2019-07-09 DIAGNOSIS — E7800 Pure hypercholesterolemia, unspecified: Secondary | ICD-10-CM | POA: Diagnosis not present

## 2019-07-09 DIAGNOSIS — G4730 Sleep apnea, unspecified: Secondary | ICD-10-CM | POA: Diagnosis not present

## 2019-07-09 DIAGNOSIS — M1712 Unilateral primary osteoarthritis, left knee: Secondary | ICD-10-CM | POA: Diagnosis not present

## 2019-07-09 HISTORY — DX: Unspecified osteoarthritis, unspecified site: M19.90

## 2019-07-09 HISTORY — PX: KNEE ARTHROPLASTY: SHX992

## 2019-07-09 LAB — GLUCOSE, CAPILLARY: Glucose-Capillary: 125 mg/dL — ABNORMAL HIGH (ref 70–99)

## 2019-07-09 SURGERY — ARTHROPLASTY, KNEE, TOTAL, USING IMAGELESS COMPUTER-ASSISTED NAVIGATION
Anesthesia: Spinal | Site: Knee | Laterality: Left

## 2019-07-09 MED ORDER — FENTANYL CITRATE (PF) 100 MCG/2ML IJ SOLN
INTRAMUSCULAR | Status: AC
  Administered 2019-07-09: 25 ug via INTRAVENOUS
  Filled 2019-07-09: qty 2

## 2019-07-09 MED ORDER — FENTANYL CITRATE (PF) 100 MCG/2ML IJ SOLN
25.0000 ug | INTRAMUSCULAR | Status: DC | PRN
  Administered 2019-07-09 (×2): 25 ug via INTRAVENOUS

## 2019-07-09 MED ORDER — SODIUM CHLORIDE 0.9 % IV SOLN: INTRAVENOUS | Status: DC

## 2019-07-09 MED ORDER — NEOMYCIN-POLYMYXIN B GU 40-200000 IR SOLN
Status: DC | PRN
  Administered 2019-07-09: 14 mL

## 2019-07-09 MED ORDER — BUPIVACAINE HCL (PF) 0.5 % IJ SOLN
INTRAMUSCULAR | Status: DC | PRN
  Administered 2019-07-09: 3 mL

## 2019-07-09 MED ORDER — TRAMADOL HCL 50 MG PO TABS: 50.0000 mg | ORAL_TABLET | Freq: Four times a day (QID) | ORAL | 0 refills | Status: DC | PRN

## 2019-07-09 MED ORDER — ENOXAPARIN SODIUM 40 MG/0.4ML ~~LOC~~ SOLN: 40.0000 mg | SUBCUTANEOUS | Status: DC

## 2019-07-09 MED ORDER — CHLORHEXIDINE GLUCONATE 4 % EX LIQD
60.0000 mL | Freq: Once | CUTANEOUS | Status: AC
  Administered 2019-07-09: 4 via TOPICAL

## 2019-07-09 MED ORDER — CELECOXIB 200 MG PO CAPS
ORAL_CAPSULE | ORAL | Status: AC
  Administered 2019-07-09: 400 mg via ORAL
  Filled 2019-07-09: qty 2

## 2019-07-09 MED ORDER — PHENYLEPHRINE HCL (PRESSORS) 10 MG/ML IV SOLN
INTRAVENOUS | Status: DC | PRN
  Administered 2019-07-09: 100 ug via INTRAVENOUS

## 2019-07-09 MED ORDER — OXYCODONE HCL 5 MG/5ML PO SOLN: 5.0000 mg | Freq: Once | ORAL | Status: AC | PRN

## 2019-07-09 MED ORDER — OXYCODONE HCL 5 MG PO TABS
5.0000 mg | ORAL_TABLET | Freq: Once | ORAL | Status: AC | PRN
  Administered 2019-07-09: 5 mg via ORAL

## 2019-07-09 MED ORDER — CLINDAMYCIN PHOSPHATE 900 MG/50ML IV SOLN
900.0000 mg | INTRAVENOUS | Status: AC
  Administered 2019-07-09: 900 mg via INTRAVENOUS

## 2019-07-09 MED ORDER — CELECOXIB 200 MG PO CAPS: 200.0000 mg | ORAL_CAPSULE | Freq: Two times a day (BID) | ORAL | Status: DC

## 2019-07-09 MED ORDER — LACTATED RINGERS IV BOLUS: 500.0000 mL | Freq: Once | INTRAVENOUS | Status: DC

## 2019-07-09 MED ORDER — ACETAMINOPHEN 325 MG PO TABS: 325.0000 mg | ORAL_TABLET | Freq: Four times a day (QID) | ORAL | Status: DC | PRN

## 2019-07-09 MED ORDER — LACTATED RINGERS IV BOLUS
250.0000 mL | Freq: Once | INTRAVENOUS | Status: AC
  Administered 2019-07-09: 250 mL via INTRAVENOUS

## 2019-07-09 MED ORDER — ACETAMINOPHEN 10 MG/ML IV SOLN: 1000.0000 mg | Freq: Four times a day (QID) | INTRAVENOUS | Status: DC

## 2019-07-09 MED ORDER — CELECOXIB 200 MG PO CAPS: 400.0000 mg | ORAL_CAPSULE | Freq: Once | ORAL | Status: AC

## 2019-07-09 MED ORDER — TRANEXAMIC ACID-NACL 1000-0.7 MG/100ML-% IV SOLN
1000.0000 mg | INTRAVENOUS | Status: AC
  Administered 2019-07-09: 1000 mg via INTRAVENOUS

## 2019-07-09 MED ORDER — LACTATED RINGERS IV SOLN: INTRAVENOUS | Status: DC

## 2019-07-09 MED ORDER — ONDANSETRON HCL 4 MG/2ML IJ SOLN: 4.0000 mg | Freq: Four times a day (QID) | INTRAMUSCULAR | Status: DC | PRN

## 2019-07-09 MED ORDER — OXYCODONE HCL 5 MG PO TABS
ORAL_TABLET | ORAL | Status: AC
  Filled 2019-07-09: qty 1

## 2019-07-09 MED ORDER — DEXAMETHASONE SODIUM PHOSPHATE 10 MG/ML IJ SOLN: 8.0000 mg | Freq: Once | INTRAMUSCULAR | Status: AC

## 2019-07-09 MED ORDER — ACETAMINOPHEN 10 MG/ML IV SOLN
INTRAVENOUS | Status: DC | PRN
  Administered 2019-07-09: 1000 mg via INTRAVENOUS

## 2019-07-09 MED ORDER — HYDROMORPHONE HCL 1 MG/ML IJ SOLN: 0.5000 mg | INTRAMUSCULAR | Status: DC | PRN

## 2019-07-09 MED ORDER — ALUM & MAG HYDROXIDE-SIMETH 200-200-20 MG/5ML PO SUSP: 30.0000 mL | ORAL | Status: DC | PRN

## 2019-07-09 MED ORDER — ONDANSETRON HCL 4 MG/2ML IJ SOLN: 4.0000 mg | Freq: Once | INTRAMUSCULAR | Status: DC | PRN

## 2019-07-09 MED ORDER — OXYCODONE HCL 5 MG PO TABS: 5.0000 mg | ORAL_TABLET | ORAL | 0 refills | Status: DC | PRN

## 2019-07-09 MED ORDER — PHENOL 1.4 % MT LIQD
1.0000 | OROMUCOSAL | Status: DC | PRN
  Filled 2019-07-09: qty 177

## 2019-07-09 MED ORDER — OXYCODONE HCL 5 MG PO TABS
10.0000 mg | ORAL_TABLET | ORAL | Status: DC | PRN
  Filled 2019-07-09: qty 2

## 2019-07-09 MED ORDER — MAGNESIUM HYDROXIDE 400 MG/5ML PO SUSP
30.0000 mL | Freq: Every day | ORAL | Status: DC
  Filled 2019-07-09: qty 30

## 2019-07-09 MED ORDER — METOCLOPRAMIDE HCL 5 MG/ML IJ SOLN: 5.0000 mg | Freq: Three times a day (TID) | INTRAMUSCULAR | Status: DC | PRN

## 2019-07-09 MED ORDER — ONDANSETRON HCL 4 MG PO TABS: 4.0000 mg | ORAL_TABLET | Freq: Four times a day (QID) | ORAL | Status: DC | PRN

## 2019-07-09 MED ORDER — METOCLOPRAMIDE HCL 10 MG PO TABS: 10.0000 mg | ORAL_TABLET | Freq: Three times a day (TID) | ORAL | Status: DC

## 2019-07-09 MED ORDER — SODIUM CHLORIDE FLUSH 0.9 % IV SOLN
INTRAVENOUS | Status: AC
  Filled 2019-07-09: qty 40

## 2019-07-09 MED ORDER — FLEET ENEMA 7-19 GM/118ML RE ENEM: 1.0000 | ENEMA | Freq: Once | RECTAL | Status: DC | PRN

## 2019-07-09 MED ORDER — DEXAMETHASONE SODIUM PHOSPHATE 10 MG/ML IJ SOLN
INTRAMUSCULAR | Status: AC
  Administered 2019-07-09: 8 mg via INTRAVENOUS
  Filled 2019-07-09: qty 1

## 2019-07-09 MED ORDER — BUPIVACAINE HCL (PF) 0.25 % IJ SOLN
INTRAMUSCULAR | Status: AC
  Filled 2019-07-09: qty 60

## 2019-07-09 MED ORDER — GABAPENTIN 300 MG PO CAPS: 300.0000 mg | ORAL_CAPSULE | Freq: Once | ORAL | Status: DC

## 2019-07-09 MED ORDER — CELECOXIB 200 MG PO CAPS: 200.0000 mg | ORAL_CAPSULE | Freq: Two times a day (BID) | ORAL | 2 refills | Status: DC

## 2019-07-09 MED ORDER — ENOXAPARIN SODIUM 40 MG/0.4ML ~~LOC~~ SOLN: 40.0000 mg | SUBCUTANEOUS | 0 refills | Status: DC

## 2019-07-09 MED ORDER — TRAMADOL HCL 50 MG PO TABS: 50.0000 mg | ORAL_TABLET | ORAL | Status: DC | PRN

## 2019-07-09 MED ORDER — MENTHOL 3 MG MT LOZG
1.0000 | LOZENGE | OROMUCOSAL | Status: DC | PRN
  Filled 2019-07-09: qty 9

## 2019-07-09 MED ORDER — FERROUS SULFATE 325 (65 FE) MG PO TABS: 325.0000 mg | ORAL_TABLET | Freq: Two times a day (BID) | ORAL | Status: DC

## 2019-07-09 MED ORDER — ENSURE PRE-SURGERY PO LIQD
296.0000 mL | Freq: Once | ORAL | Status: AC
  Administered 2019-07-09: 296 mL via ORAL
  Filled 2019-07-09: qty 296

## 2019-07-09 MED ORDER — BISACODYL 10 MG RE SUPP
10.0000 mg | Freq: Every day | RECTAL | Status: DC | PRN
  Filled 2019-07-09: qty 1

## 2019-07-09 MED ORDER — MIDAZOLAM HCL 5 MG/5ML IJ SOLN
INTRAMUSCULAR | Status: DC | PRN
  Administered 2019-07-09: 2 mg via INTRAVENOUS

## 2019-07-09 MED ORDER — MIDAZOLAM HCL 2 MG/2ML IJ SOLN
INTRAMUSCULAR | Status: AC
  Filled 2019-07-09: qty 2

## 2019-07-09 MED ORDER — FENTANYL CITRATE (PF) 100 MCG/2ML IJ SOLN
INTRAMUSCULAR | Status: DC | PRN
  Administered 2019-07-09 (×2): 50 ug via INTRAVENOUS

## 2019-07-09 MED ORDER — PROPOFOL 500 MG/50ML IV EMUL
INTRAVENOUS | Status: DC | PRN
  Administered 2019-07-09: 50 ug/kg/min via INTRAVENOUS

## 2019-07-09 MED ORDER — OXYCODONE HCL 5 MG PO TABS
5.0000 mg | ORAL_TABLET | ORAL | Status: DC | PRN
  Filled 2019-07-09: qty 1

## 2019-07-09 MED ORDER — PANTOPRAZOLE SODIUM 40 MG PO TBEC: 40.0000 mg | DELAYED_RELEASE_TABLET | Freq: Two times a day (BID) | ORAL | Status: DC

## 2019-07-09 MED ORDER — TRANEXAMIC ACID-NACL 1000-0.7 MG/100ML-% IV SOLN
1000.0000 mg | Freq: Once | INTRAVENOUS | Status: AC
  Administered 2019-07-09: 1000 mg via INTRAVENOUS

## 2019-07-09 MED ORDER — BUPIVACAINE LIPOSOME 1.3 % IJ SUSP
INTRAMUSCULAR | Status: AC
  Filled 2019-07-09: qty 20

## 2019-07-09 MED ORDER — ACETAMINOPHEN 10 MG/ML IV SOLN
INTRAVENOUS | Status: AC
  Filled 2019-07-09: qty 100

## 2019-07-09 MED ORDER — METOCLOPRAMIDE HCL 10 MG PO TABS: 5.0000 mg | ORAL_TABLET | Freq: Three times a day (TID) | ORAL | Status: DC | PRN

## 2019-07-09 MED ORDER — TRANEXAMIC ACID-NACL 1000-0.7 MG/100ML-% IV SOLN
INTRAVENOUS | Status: AC
  Filled 2019-07-09: qty 100

## 2019-07-09 MED ORDER — PROPOFOL 500 MG/50ML IV EMUL
INTRAVENOUS | Status: AC
  Filled 2019-07-09: qty 50

## 2019-07-09 MED ORDER — SENNOSIDES-DOCUSATE SODIUM 8.6-50 MG PO TABS: 1.0000 | ORAL_TABLET | Freq: Two times a day (BID) | ORAL | Status: DC

## 2019-07-09 MED ORDER — CLINDAMYCIN PHOSPHATE 600 MG/50ML IV SOLN: 600.0000 mg | Freq: Four times a day (QID) | INTRAVENOUS | Status: DC

## 2019-07-09 MED ORDER — FENTANYL CITRATE (PF) 100 MCG/2ML IJ SOLN
INTRAMUSCULAR | Status: AC
  Filled 2019-07-09: qty 2

## 2019-07-09 MED ORDER — DIPHENHYDRAMINE HCL 12.5 MG/5ML PO ELIX
12.5000 mg | ORAL_SOLUTION | ORAL | Status: DC | PRN
  Filled 2019-07-09: qty 10

## 2019-07-09 MED ORDER — SODIUM CHLORIDE 0.9 % IV SOLN
INTRAVENOUS | Status: DC | PRN
  Administered 2019-07-09: 60 mL

## 2019-07-09 MED ORDER — BUPIVACAINE HCL (PF) 0.25 % IJ SOLN
INTRAMUSCULAR | Status: DC | PRN
  Administered 2019-07-09: 60 mL

## 2019-07-09 SURGICAL SUPPLY — 76 items
ATTUNE PS FEM LT SZ 5 CEM KNEE (Femur) ×2 IMPLANT
ATTUNE PSRP INSE SZ5 7 KNEE (Insert) ×2 IMPLANT
BASE TIBIAL ROT PLAT SZ 5 KNEE (Knees) ×1 IMPLANT
BATTERY INSTRU NAVIGATION (MISCELLANEOUS) ×8 IMPLANT
BLADE SAW 70X12.5 (BLADE) ×2 IMPLANT
BLADE SAW 90X13X1.19 OSCILLAT (BLADE) ×2 IMPLANT
BLADE SAW 90X25X1.19 OSCILLAT (BLADE) ×2 IMPLANT
BONE CEMENT GENTAMICIN (Cement) ×4 IMPLANT
CANISTER SUCT 3000ML PPV (MISCELLANEOUS) ×2 IMPLANT
CEMENT BONE GENTAMICIN 40 (Cement) ×2 IMPLANT
COOLER POLAR GLACIER W/PUMP (MISCELLANEOUS) ×2 IMPLANT
COVER WAND RF STERILE (DRAPES) ×2 IMPLANT
CUFF TOURN SGL QUICK 24 (TOURNIQUET CUFF)
CUFF TOURN SGL QUICK 30 (TOURNIQUET CUFF)
CUFF TOURN SGL QUICK 34 (TOURNIQUET CUFF) ×1
CUFF TRNQT CYL 24X4X16.5-23 (TOURNIQUET CUFF) IMPLANT
CUFF TRNQT CYL 30X4X21-28X (TOURNIQUET CUFF) IMPLANT
CUFF TRNQT CYL 34X4.125X (TOURNIQUET CUFF) ×1 IMPLANT
DRAPE 3/4 80X56 (DRAPES) ×2 IMPLANT
DRSG AQUACEL AG ADV 3.5X14 (GAUZE/BANDAGES/DRESSINGS) ×2 IMPLANT
DRSG DERMACEA 8X12 NADH (GAUZE/BANDAGES/DRESSINGS) ×2 IMPLANT
DRSG OPSITE POSTOP 4X14 (GAUZE/BANDAGES/DRESSINGS) IMPLANT
DRSG TEGADERM 4X4.75 (GAUZE/BANDAGES/DRESSINGS) ×2 IMPLANT
DURAPREP 26ML APPLICATOR (WOUND CARE) ×4 IMPLANT
ELECT REM PT RETURN 9FT ADLT (ELECTROSURGICAL) ×2
ELECTRODE REM PT RTRN 9FT ADLT (ELECTROSURGICAL) ×1 IMPLANT
EX-PIN ORTHOLOCK NAV 4X150 (PIN) ×4 IMPLANT
GLOVE BIO SURGEON STRL SZ7.5 (GLOVE) ×4 IMPLANT
GLOVE BIOGEL M STRL SZ7.5 (GLOVE) ×4 IMPLANT
GLOVE BIOGEL PI IND STRL 7.5 (GLOVE) ×1 IMPLANT
GLOVE BIOGEL PI INDICATOR 7.5 (GLOVE) ×1
GLOVE INDICATOR 8.0 STRL GRN (GLOVE) ×2 IMPLANT
GOWN STRL REUS W/ TWL LRG LVL3 (GOWN DISPOSABLE) ×2 IMPLANT
GOWN STRL REUS W/ TWL XL LVL3 (GOWN DISPOSABLE) ×1 IMPLANT
GOWN STRL REUS W/TWL LRG LVL3 (GOWN DISPOSABLE) ×2
GOWN STRL REUS W/TWL XL LVL3 (GOWN DISPOSABLE) ×1
HEMOVAC 400CC 10FR (MISCELLANEOUS) ×2 IMPLANT
HOLDER FOLEY CATH W/STRAP (MISCELLANEOUS) ×2 IMPLANT
HOOD PEEL AWAY FLYTE STAYCOOL (MISCELLANEOUS) ×4 IMPLANT
KIT TURNOVER KIT A (KITS) ×2 IMPLANT
KNIFE SCULPS 14X20 (INSTRUMENTS) ×2 IMPLANT
LABEL OR SOLS (LABEL) ×2 IMPLANT
MANIFOLD NEPTUNE II (INSTRUMENTS) ×2 IMPLANT
NDL SAFETY ECLIPSE 18X1.5 (NEEDLE) ×1 IMPLANT
NEEDLE HYPO 18GX1.5 SHARP (NEEDLE) ×1
NEEDLE SPNL 20GX3.5 QUINCKE YW (NEEDLE) ×4 IMPLANT
NS IRRIG 500ML POUR BTL (IV SOLUTION) ×2 IMPLANT
PACK TOTAL KNEE (MISCELLANEOUS) ×2 IMPLANT
PAD WRAPON POLAR KNEE (MISCELLANEOUS) ×1 IMPLANT
PATELLA MEDIAL ATTUN 35MM KNEE (Knees) ×2 IMPLANT
PENCIL SMOKE EVACUATOR COATED (MISCELLANEOUS) ×2 IMPLANT
PENCIL SMOKE ULTRAEVAC 22 CON (MISCELLANEOUS) ×2 IMPLANT
PIN DRILL QUICK PACK ×2 IMPLANT
PIN FIXATION 1/8DIA X 3INL (PIN) ×6 IMPLANT
PULSAVAC PLUS IRRIG FAN TIP (DISPOSABLE) ×2
SOL .9 NS 3000ML IRR  AL (IV SOLUTION) ×1
SOL .9 NS 3000ML IRR UROMATIC (IV SOLUTION) ×1 IMPLANT
SOL PREP PVP 2OZ (MISCELLANEOUS) ×2
SOLUTION PREP PVP 2OZ (MISCELLANEOUS) ×1 IMPLANT
SPONGE DRAIN TRACH 4X4 STRL 2S (GAUZE/BANDAGES/DRESSINGS) IMPLANT
STAPLER SKIN PROX 35W (STAPLE) ×2 IMPLANT
STOCKINETTE IMPERV 14X48 (MISCELLANEOUS) ×2 IMPLANT
STRAP TIBIA SHORT (MISCELLANEOUS) ×2 IMPLANT
SUCTION FRAZIER HANDLE 10FR (MISCELLANEOUS) ×1
SUCTION TUBE FRAZIER 10FR DISP (MISCELLANEOUS) ×1 IMPLANT
SUT VIC AB 0 CT1 36 (SUTURE) ×4 IMPLANT
SUT VIC AB 1 CT1 36 (SUTURE) ×4 IMPLANT
SUT VIC AB 2-0 CT2 27 (SUTURE) ×2 IMPLANT
SYR 20ML LL LF (SYRINGE) ×2 IMPLANT
SYR 30ML LL (SYRINGE) ×4 IMPLANT
TIBIAL BASE ROT PLAT SZ 5 KNEE (Knees) ×2 IMPLANT
TIP FAN IRRIG PULSAVAC PLUS (DISPOSABLE) ×1 IMPLANT
TOWEL OR 17X26 4PK STRL BLUE (TOWEL DISPOSABLE) ×2 IMPLANT
TOWER CARTRIDGE SMART MIX (DISPOSABLE) ×2 IMPLANT
TRAY FOLEY MTR SLVR 16FR STAT (SET/KITS/TRAYS/PACK) ×2 IMPLANT
WRAPON POLAR PAD KNEE (MISCELLANEOUS) ×2

## 2019-07-09 NOTE — Anesthesia Procedure Notes (Signed)
Spinal  Patient location during procedure: OR Start time: 07/09/2019 11:16 AM Staffing Performed: resident/CRNA  Anesthesiologist: Emmie Niemann, MD Resident/CRNA: Jaeshaun Riva, Einar Grad, CRNA Preanesthetic Checklist Completed: patient identified, IV checked, site marked, risks and benefits discussed, surgical consent, monitors and equipment checked, pre-op evaluation and timeout performed Spinal Block Patient position: sitting Prep: DuraPrep Patient monitoring: heart rate, cardiac monitor, continuous pulse ox and blood pressure Approach: midline Location: L3-4 Injection technique: single-shot Needle Needle type: Sprotte  Needle gauge: 24 G Needle length: 9 cm Assessment Sensory level: T4 Additional Notes +CSF flow, -heme, -parathesia, T4 level achieved w/i approx. 10 min.

## 2019-07-09 NOTE — Transfer of Care (Addendum)
Immediate Anesthesia Transfer of Care Note  Patient: Rachael Brewer  Procedure(s) Performed: COMPUTER ASSISTED TOTAL KNEE ARTHROPLASTY (Left Knee)  Patient Location: PACU  Anesthesia Type:Spinal  Level of Consciousness: awake, alert , oriented and patient cooperative  Airway & Oxygen Therapy: Patient Spontanous Breathing  Post-op Assessment: Report given to RN  Post vital signs: Reviewed and stable  Last Vitals:  Vitals Value Taken Time  BP 106/76 07/09/19 1444  Temp    Pulse 77 07/09/19 1446  Resp 11 07/09/19 1445  SpO2 95 % 07/09/19 1446  Vitals shown include unvalidated device data.  Last Pain:  Vitals:   07/09/19 1038  TempSrc: Temporal  PainSc: 0-No pain         Complications: No apparent anesthesia complications

## 2019-07-09 NOTE — Evaluation (Signed)
Occupational Therapy Evaluation Patient Details Name: Rachael Brewer MRN: [id number] DOB: January 31, 1961 Today's Date: 07/09/2019    History of Present Illness Rachael Brewer is a 51yoF POD0 s/p L TKA with PMHx including R TKA (04/2019), OSA, preDM, migraine HA, GERD, GAD.   Clinical Impression   Pt seen for OT evaluation this date, POD#0 from above surgery. Pt was independent in all ADL prior to surgery and since recovering from previous R TKA in November, 2020. Pt is eager to return to PLOF with less pain and improved safety and independence. Pt currently presents with deficits as described below (Please see OT Problem List for additional detail) which results in need for increased assist for ADL and functional mobility. Pt currently requires minimal assist for LB dressing and dressing tasks while in seated position due to pain and limited AROM of L knee. RN notified of pt's pain in L knee and pt's request for additional pain control prior to OOB attempts. Pt instructed in polar care mgt, falls prevention strategies, home/routines modifications, DME/AE for LB bathing and dressing tasks, pet care considerations, and compression stocking mgt. Handout provided to support recall and carryover. Pt would benefit from skilled OT services while hospitalized (if she does not discharge home this date) including additional instruction in dressing techniques with or without assistive devices for dressing and bathing skills to support recall and carryover prior to discharge and ultimately to maximize safety, independence, and minimize falls risk and caregiver burden. Do not currently anticipate any OT needs following this hospitalization.      Follow Up Recommendations  No OT follow up    Equipment Recommendations  None recommended by OT    Recommendations for Other Services       Precautions / Restrictions Precautions Precautions: Fall Restrictions Weight Bearing Restrictions: Yes LLE Weight Bearing:  Weight bearing as tolerated      Mobility Bed Mobility               General bed mobility comments: deferred, pt requesting additional pain control prior to attempts, RN notified  Transfers                 General transfer comment: deferred, pt requesting additional pain control prior to attempts, RN notified    Balance                                           ADL either performed or assessed with clinical judgement   ADL Overall ADL's : Needs assistance/impaired                                       General ADL Comments: Pt requires Min A for LB ADL tasks; pt reports family able to provide needed level of assist     Vision Baseline Vision/History: Wears glasses Wears Glasses: At all times Patient Visual Report: No change from baseline Vision Assessment?: No apparent visual deficits     Perception     Praxis      Pertinent Vitals/Pain Pain Assessment: 0-10 Pain Score: 5  Pain Location: L knee; 3/10 neck/shoulders (happening for past 7-10 days with no radiating symptoms); mild migraine Pain Descriptors / Indicators: Aching Pain Intervention(s): Limited activity within patient's tolerance;Monitored during session;Premedicated before session;Repositioned;Patient requesting pain meds-RN notified;Ice applied  Hand Dominance Right   Extremity/Trunk Assessment Upper Extremity Assessment Upper Extremity Assessment: Overall WFL for tasks assessed   Lower Extremity Assessment Lower Extremity Assessment: Defer to PT evaluation;LLE deficits/detail LLE Deficits / Details: able to perform SLR without assist, good DF/PF, denies sensory deficits, limited slightly by pain LLE: Unable to fully assess due to pain       Communication Communication Communication: No difficulties   Cognition Arousal/Alertness: Awake/alert Behavior During Therapy: WFL for tasks assessed/performed Overall Cognitive Status: Within Functional  Limits for tasks assessed                                     General Comments       Exercises Other Exercises Other Exercises: Pt instructed in falls prevention, compression stocking mgt, polar care mgt, AE/DME for ADL, home/routines modifications, and pet care considerations to maximize safety/indep with ADL; handout provided to support recall and carryover   Shoulder Instructions      Home Living Family/patient expects to be discharged to:: Private residence Living Arrangements: Spouse/significant other;Children Available Help at Discharge: Family;Available 24 hours/day Type of Home: House Home Access: Stairs to enter [employer] of Steps: 1 Entrance Stairs-Rails: None Home Layout: One level     Bathroom Shower/Tub: Teacher, early years/pre: Standard Bathroom Accessibility: Yes How Accessible: Accessible via walker Home Equipment: Cane - single point;Bedside commode;Walker - 2 wheels          Prior Functioning/Environment Level of Independence: Independent        Comments: Since recovery from R TKA, pt has been indep with mobility and basic ADL tasks, driving, and works from home (makes [employer])        OT Problem List: Decreased strength;Pain;Decreased range of motion      OT Treatment/Interventions: Self-care/ADL training;Therapeutic exercise;Therapeutic activities;DME and/or AE instruction;Patient/family education;Balance training    OT Goals(Current goals can be found in the care plan section) Acute Rehab OT Goals Patient Stated Goal: go home and recover OT Goal Formulation: With patient Time For Goal Achievement: 07/23/19 Potential to Achieve Goals: Good ADL Goals Pt Will Perform Lower Body Dressing: with adaptive equipment;with set-up;with supervision;sit to/from stand Pt Will Transfer to Toilet: ambulating;with min guard assist;bedside commode(LRAD for amb) Additional ADL Goal #1: Pt will independently instruct  family in compression stocking mgt and polar care mgt  OT Frequency: Min 1X/week   Barriers to D/C:            Co-evaluation              AM-PAC OT "6 Clicks" Daily Activity     Outcome Measure Help from another person eating meals?: None Help from another person taking care of personal grooming?: None Help from another person toileting, which includes using toliet, bedpan, or urinal?: A Little Help from another person bathing (including washing, rinsing, drying)?: A Little Help from another person to put on and taking off regular upper body clothing?: None Help from another person to put on and taking off regular lower body clothing?: A Little 6 Click Score: 21   End of Session Nurse Communication: Patient requests pain meds  Activity Tolerance: Patient tolerated treatment well;Patient limited by pain Patient left: in bed;with call bell/phone within reach;with nursing/sitter in room;Other (comment)(polar care in place)  OT Visit Diagnosis: Other abnormalities of gait and mobility (R26.89);Pain Pain - Right/Left: Left Pain - part of body: Knee  Time: [id number] OT Time Calculation (min): 18 min Charges:  OT General Charges $OT Visit: 1 Visit OT Evaluation $OT Eval Low Complexity: 1 Low OT Treatments $Self Care/Home Management : 8-22 mins  Jeni Salles, MPH, MS, OTR/L ascom [phone] 07/09/19, 5:15 PM

## 2019-07-09 NOTE — Anesthesia Preprocedure Evaluation (Signed)
Anesthesia Evaluation  Patient identified by MRN, date of birth, ID band Patient awake    Reviewed: Allergy & Precautions, NPO status , Patient's Chart, lab work & pertinent test results  History of Anesthesia Complications (+) history of anesthetic complications (GA caused migraine)  Airway Mallampati: II  TM Distance: >3 FB Neck ROM: Full    Dental no notable dental hx.    Pulmonary asthma , sleep apnea and Continuous Positive Airway Pressure Ventilation , former smoker,    breath sounds clear to auscultation- rhonchi (-) wheezing      Cardiovascular hypertension, Pt. on medications (-) CAD, (-) Past MI, (-) Cardiac Stents and (-) CABG  Rhythm:Regular Rate:Normal - Systolic murmurs and - Diastolic murmurs    Neuro/Psych  Headaches, neg Seizures PSYCHIATRIC DISORDERS Anxiety Depression    GI/Hepatic Neg liver ROS, GERD  ,  Endo/Other  negative endocrine ROSneg diabetes  Renal/GU negative Renal ROS     Musculoskeletal  (+) Arthritis ,   Abdominal (+) + obese,   Peds  Hematology negative hematology ROS (+)   Anesthesia Other Findings Past Medical History: No date: Anxiety No date: Arthritis No date: Complication of anesthesia     Comment:  CAUSES MIGRAINE No date: Depression No date: Edema of both lower extremities No date: GERD (gastroesophageal reflux disease) No date: Migraines No date: Pre-diabetes No date: Sleep apnea     Comment:  CPAP   Reproductive/Obstetrics                             Lab Results  Component Value Date   WBC 7.7 07/06/2019   HGB 14.5 07/06/2019   HCT 43.0 07/06/2019   MCV 89.4 07/06/2019   PLT 301 07/06/2019    Anesthesia Physical Anesthesia Plan  ASA: III  Anesthesia Plan: Spinal   Post-op Pain Management:    Induction:   PONV Risk Score and Plan: 2 and Propofol infusion  Airway Management Planned: Natural Airway  Additional Equipment:    Intra-op Plan:   Post-operative Plan:   Informed Consent: I have reviewed the patients History and Physical, chart, labs and discussed the procedure including the risks, benefits and alternatives for the proposed anesthesia with the patient or authorized representative who has indicated his/her understanding and acceptance.     Dental advisory given  Plan Discussed with: CRNA and Anesthesiologist  Anesthesia Plan Comments:         Anesthesia Quick Evaluation

## 2019-07-09 NOTE — Op Note (Signed)
OPERATIVE NOTE  DATE OF SURGERY:  07/09/2019  PATIENT NAME:  Rachael Brewer   DOB: 1960/12/30  MRN: [id number]  PRE-OPERATIVE DIAGNOSIS: Degenerative arthrosis of the left knee, primary  POST-OPERATIVE DIAGNOSIS:  Same  PROCEDURE:  Left total knee arthroplasty using computer-assisted navigation  SURGEON:  Marciano Sequin. M.D.  ASSISTANT: Cassell Smiles, PA-C (present and scrubbed throughout the case, critical for assistance with exposure, retraction, instrumentation, and closure)  ANESTHESIA: spinal  ESTIMATED BLOOD LOSS: 100 mL  FLUIDS REPLACED: 800 mL of crystalloid  TOURNIQUET TIME: 91 minutes  DRAINS: 2 medium Hemovac drains  SOFT TISSUE RELEASES: Anterior cruciate ligament, posterior cruciate ligament, deep medial collateral ligament, patellofemoral ligament  IMPLANTS UTILIZED: DePuy Attune size 5 posterior stabilized femoral component (cemented), size 5 rotating platform tibial component (cemented), 35 mm medialized dome patella (cemented), and a 7 mm stabilized rotating platform polyethylene insert.  INDICATIONS FOR SURGERY: Rachael Brewer is a 59 y.o. year old female with a long history of progressive knee pain. X-rays demonstrated severe degenerative changes in tricompartmental fashion. The patient had not seen any significant improvement despite conservative nonsurgical intervention. After discussion of the risks and benefits of surgical intervention, the patient expressed understanding of the risks benefits and agree with plans for total knee arthroplasty.   The risks, benefits, and alternatives were discussed at length including but not limited to the risks of infection, bleeding, nerve injury, stiffness, blood clots, the need for revision surgery, cardiopulmonary complications, among others, and they were willing to proceed.  PROCEDURE IN DETAIL: The patient was brought into the operating room and, after adequate spinal anesthesia was achieved, a tourniquet was  placed on the patient's upper thigh. The patient's knee and leg were cleaned and prepped with alcohol and DuraPrep and draped in the usual sterile fashion. A "timeout" was performed as per usual protocol. The lower extremity was exsanguinated using an Esmarch, and the tourniquet was inflated to 300 mmHg. An anterior longitudinal incision was made followed by a standard mid vastus approach. The deep fibers of the medial collateral ligament were elevated in a subperiosteal fashion off of the medial flare of the tibia so as to maintain a continuous soft tissue sleeve. The patella was subluxed laterally and the patellofemoral ligament was incised. Inspection of the knee demonstrated severe degenerative changes with full-thickness loss of articular cartilage. Osteophytes were debrided using a rongeur. Anterior and posterior cruciate ligaments were excised. Two 4.0 mm Schanz pins were inserted in the femur and into the tibia for attachment of the array of trackers used for computer-assisted navigation. Hip center was identified using a circumduction technique. Distal landmarks were mapped using the computer. The distal femur and proximal tibia were mapped using the computer. The distal femoral cutting guide was positioned using computer-assisted navigation so as to achieve a 5 distal valgus cut. The femur was sized and it was felt that a size 5 femoral component was appropriate. A size 5 femoral cutting guide was positioned and the anterior cut was performed and verified using the computer. This was followed by completion of the posterior and chamfer cuts. Femoral cutting guide for the central box was then positioned in the center box cut was performed.  Attention was then directed to the proximal tibia. Medial and lateral menisci were excised. The extramedullary tibial cutting guide was positioned using computer-assisted navigation so as to achieve a 0 varus-valgus alignment and 3 posterior slope. The cut was  performed and verified using the computer. The proximal tibia was  sized and it was felt that a size 5 tibial tray was appropriate. Tibial and femoral trials were inserted followed by insertion of a 7 mm polyethylene insert. This allowed for excellent mediolateral soft tissue balancing both in flexion and in full extension. Finally, the patella was cut and prepared so as to accommodate a 35 mm medialized dome patella. A patella trial was placed and the knee was placed through a range of motion with excellent patellar tracking appreciated. The femoral trial was removed after debridement of posterior osteophytes. The central post-hole for the tibial component was reamed followed by insertion of a keel punch. Tibial trials were then removed. Cut surfaces of bone were irrigated with copious amounts of normal saline with antibiotic solution using pulsatile lavage and then suctioned dry. Polymethylmethacrylate cement with gentamicin was prepared in the usual fashion using a vacuum mixer. Cement was applied to the cut surface of the proximal tibia as well as along the undersurface of a size 5 rotating platform tibial component. Tibial component was positioned and impacted into place. Excess cement was removed using [employer] fast streamer. Cement was then applied to the cut surfaces of the femur as well as along the posterior flanges of the size 5 femoral component. The femoral component was positioned and impacted into place. Excess cement was removed using [employer] fast streamer. A 7 mm polyethylene trial was inserted and the knee was brought into full extension with steady axial compression applied. Finally, cement was applied to the backside of a 35 mm medialized dome patella and the patellar component was positioned and patellar clamp applied. Excess cement was removed using [employer] fast streamer. After adequate curing of the cement, the tourniquet was deflated after a total tourniquet time of 91 minutes. Hemostasis was achieved using  electrocautery. The knee was irrigated with copious amounts of normal saline with antibiotic solution using pulsatile lavage and then suctioned dry. 20 mL of 1.3% Exparel and 60 mL of 0.25% Marcaine in 40 mL of normal saline was injected along the posterior capsule, medial and lateral gutters, and along the arthrotomy site. A 7 mm stabilized rotating platform polyethylene insert was inserted and the knee was placed through a range of motion with excellent mediolateral soft tissue balancing appreciated and excellent patellar tracking noted. 2 medium drains were placed in the wound bed and brought out through separate stab incisions. The medial parapatellar portion of the incision was reapproximated using interrupted sutures of #1 Vicryl. Subcutaneous tissue was approximated in layers using first #0 Vicryl followed #2-0 Vicryl. The skin was approximated with skin staples. A sterile dressing was applied.  The patient tolerated the procedure well and was transported to the recovery room in stable condition.    Rachael Brewer., M.D.

## 2019-07-09 NOTE — Discharge Instructions (Signed)
AMBULATORY SURGERY  °DISCHARGE INSTRUCTIONS ° ° °1) The drugs that you were given will stay in your system until tomorrow so for the next 24 hours you should not: ° °A) Drive an automobile °B) Make any legal decisions °C) Drink any alcoholic beverage ° ° °2) You may resume regular meals tomorrow.  Today it is better to start with liquids and gradually work up to solid foods. ° °You may eat anything you prefer, but it is better to start with liquids, then soup and crackers, and gradually work up to solid foods. ° ° °3) Please notify your doctor immediately if you have any unusual bleeding, trouble breathing, redness and pain at the surgery site, drainage, fever, or pain not relieved by medication. ° ° ° °4) Additional Instructions: ° ° ° ° ° ° ° °Please contact your physician with any problems or Same Day Surgery at [phone], Monday through Friday 6 am to 4 pm, or [place] [facility] [place] [facility] number at [phone].  °Instructions after Total Knee Replacement ° ° James P. Hooten, Jr., M.D.    ° Dept. of Orthopaedics & Sports Medicine ° [facility] ° [address] ° [place], [place]  [address] ° Phone: [phone]   Fax: [phone] ° °  °DIET: °• Drink plenty of non-alcoholic fluids. °• Resume your normal diet. Include foods high in fiber. ° °ACTIVITY:  °• You may use crutches or a walker with weight-bearing as tolerated, unless instructed otherwise. °• You may be weaned off of the walker or crutches by your Physical Therapist.  °• Do NOT place pillows under the knee. Anything placed under the knee could limit your ability to straighten the knee.   °• Continue doing gentle exercises. Exercising will reduce the pain and swelling, increase motion, and prevent muscle weakness.   °• Please continue to use the TED compression stockings for 6 weeks. You may remove the stockings at night, but should reapply them in the morning. °• Do not drive or operate any equipment until instructed. ° °WOUND CARE:   °• Continue to use the PolarCare or ice packs periodically to reduce pain and swelling. °• You may bathe or shower after the staples are removed at the first office visit following surgery. ° °MEDICATIONS: °• You may resume your regular medications. °• Please take the pain medication as prescribed on the medication. °• Do not take pain medication on an empty stomach. °• You have been given a prescription for a blood thinner (Lovenox or Coumadin). Please take the medication as instructed. (NOTE: After completing a 2 week course of Lovenox, take one Enteric-coated aspirin once a day. This along with elevation will help reduce the possibility of phlebitis in your operated leg.) °• Do not drive or drink alcoholic beverages when taking pain medications. ° °CALL THE OFFICE FOR: °• Temperature above 101 degrees °• Excessive bleeding or drainage on the dressing. °• Excessive swelling, coldness, or paleness of the toes. °• Persistent nausea and vomiting. ° °FOLLOW-UP:  °• You should have an appointment to return to the office in 10-14 days after surgery. °• Arrangements have been made for continuation of Physical Therapy (either home therapy or outpatient therapy). ° ° ° °[facility] °Department Directory °     ° ° ° [email or web address] ° ° °  ° ° [email or web address] °  ° °      °Cardiology ° °Appointments: °[place] - [phone] °[place] - [phone]  Endocrinology ° °Appointments: °[place] - [phone] °[place] - [phone]  Gastroenterology ° °Appointments: °[place] -   [phone] °[place] - [phone]  °      °General Surgery ° ° °Appointments: °[place] - [phone]  Internal Medicine/Family Medicine ° °Appointments: °[place] - [phone] °[place] - [phone] °[place] - [phone]  Metabolic and Weigh Loss Surgery ° °Appointments: °[place] - [phone]  °      °Neurology ° °Appointments: °[place] - [phone] °[place] - [phone]   Neurosurgery ° °Appointments: °[place] - [phone]  Obstetrics & Gynecology ° °Appointments: °[place] - [phone] °[place] - [phone]  °      °Pediatrics ° °Appointments: °[place] - [phone] °[place] - [phone]  Physiatry ° °Appointments: °[place] -[phone]  Physical Therapy ° °Appointments: °[place] - [phone] °[place] - [phone]  °      °Podiatry ° °Appointments: °[place] - [phone] °[place] - [phone]  Pulmonology ° °Appointments: °[place] - [phone]  Rheumatology ° °Appointments: °[place] - [phone]  °      °[place] Location: °[facility]  °[address] °, [place]  [address]  [place] Location: °[facility] °[address] °[place], [place]  [address]  [place] Location: °[facility] °[address] °[place], [place]  [address]  °  °

## 2019-07-09 NOTE — Evaluation (Signed)
Physical Therapy Evaluation Patient Details Name: Rachael Brewer MRN: [id number] DOB: 1961/01/11 Today's Date: 07/09/2019   History of Present Illness  Rachael Brewer is a 2yoF POD0 s/p L TKA with PMHx including R TKA (04/2019), OSA, preDM, migraine HA, GERD, GAD.  Clinical Impression  Pt is a 59 yo female s/p L TKA. Pt received in bed agreeable to PT. Pt reporting pain in left knee at 5/10. Pt lives with her husband and son in a one level home with one step to enter. Pt recently had R TKA in November and has been doing well since surgery. Pt able to perform AP and SLR independently without assist.  Pt mod I to get EOB and min guard for transfers, ambulation and stair navigation. Pt ambulated with RW with steady gait pattern with no buckling or LOB noted. Pt educated on stair training techniques with RW with pt verbalizing and demonstrating understanding. Pt ascended/descended 2 stairs with Bil handrails safely with min guard assist. Pt also educated on how to best have her husband and son assist her with the step. Pt educated on use of polar care and encouraged to use as much as possible. Pt educated on HEP exercises to begin tomorrow with pt verbalizing understanding and demonstrating correct performance of ankle pumps, heel slides and SLR. Pt educated on and provided with demonstration from therapist for car transfer and pt understanding. Pt reporting having no concerns about returning home this evening and believes she can manage with her husband. Pt presents with decreased L knee ROM (-7-55 deg), balance and activity tolerance consistent with recent surgery. Pt will benefit from further skilled PT to improve deficits and allow for return to PLOF while decreasing fall risk. Pt educated on recommendation for HHPT and how she should be hearing from them tomorrow.     Follow Up Recommendations Home health PT;Follow surgeon's recommendation for DC plan and follow-up therapies    Equipment  Recommendations  None recommended by PT    Recommendations for Other Services       Precautions / Restrictions Precautions Precautions: Fall Restrictions Weight Bearing Restrictions: Yes LLE Weight Bearing: Weight bearing as tolerated      Mobility  Bed Mobility Overal bed mobility: Modified Independent             General bed mobility comments: mod I to get EOB, no physical assist required, increased time and effort, safe with bed mobility  Transfers Overall transfer level: Needs assistance Equipment used: Rolling walker (2 wheeled) Transfers: Sit to/from Stand Sit to Stand: Min guard         General transfer comment: min guard for safety, cuing for hand placement and use of RW, steady with transfer from bed and recliner  Ambulation/Gait Ambulation/Gait assistance: Min guard Gait Distance (Feet): 100 Feet Assistive device: Rolling walker (2 wheeled) Gait Pattern/deviations: Step-to pattern;Step-through pattern;Decreased stride length;Decreased weight shift to right;Antalgic;Trunk flexed Gait velocity: decreased   General Gait Details: pt ambulated at least 156ft- 129ft with RW, step to pattern with some reciprocal steps leading to step through pattern, mildly antalgic, increased reliance on RW for support, no buckling or unsteadiness noted  Stairs Stairs: Yes Stairs assistance: Min guard Stair Management: Two rails;Step to pattern;Forwards Number of Stairs: 2 General stair comments: pt educated on stair navigation including up with the good and down with the bad technique and how to use RW on her step without HR, pt ascended/descended 2 steps with BHR, min guard for safety, pt steady and safe with  stairs, no buckling or LOB noted, pt educated on how to best have son and husband Water quality scientist [employer] (Stroke Patients Only)       Balance Overall balance assessment: Mild deficits observed, not formally tested                                            Pertinent Vitals/Pain Pain Assessment: 0-10 Pain Score: 5  Pain Location: L knee Pain Descriptors / Indicators: Aching;Sore;Discomfort Pain Intervention(s): Limited activity within patient's tolerance;Monitored during session;Premedicated before session;Repositioned;Ice applied    Home Living Family/patient expects to be discharged to:: Private residence Living Arrangements: Spouse/significant other;Children Available Help at Discharge: Family;Available 24 hours/day Type of Home: House Home Access: Stairs to enter Entrance Stairs-Rails: None Entrance Stairs-Number of Steps: 1 Home Layout: One level Home Equipment: Cane - single point;Bedside commode;Walker - 2 wheels      Prior Function Level of Independence: Independent         Comments: Since recovery from R TKA, pt has been indep with mobility and basic ADL tasks, driving, and works from home (makes [employer])     Hand Dominance   Dominant Hand: Right    Extremity/Trunk Assessment   Upper Extremity Assessment Upper Extremity Assessment: Overall WFL for tasks assessed    Lower Extremity Assessment Lower Extremity Assessment: LLE deficits/detail LLE Deficits / Details: dec strength, ROM and balance consistent with recent surgery, able to perform SLR, heel slide to 55 deg LLE: Unable to fully assess due to pain    Cervical / Trunk Assessment Cervical / Trunk Assessment: Normal  Communication   Communication: No difficulties  Cognition Arousal/Alertness: Awake/alert Behavior During Therapy: WFL for tasks assessed/performed Overall Cognitive Status: Within Functional Limits for tasks assessed                                        General Comments General comments (skin integrity, edema, etc.): VSS    Exercises Total Joint Exercises Ankle Circles/Pumps: AROM;Both;10 reps Heel Slides: AROM;Left;5 reps Straight Leg Raises: AROM;Left;5  reps Goniometric ROM: -7-55 Other Exercises Other Exercises: pt educated on HEP including AP, quad sets, heel slides, SLR, LAQ, knee flexion; positioning of knee in extension and use of polar care Other Exercises: pt educated and provided demonstration for car transfer   Assessment/Plan    PT Assessment Patient needs continued PT services  PT Problem List Decreased strength;Decreased mobility;Decreased range of motion;Decreased activity tolerance;Decreased balance;Pain       PT Treatment Interventions DME instruction;Therapeutic exercise;Gait training;Balance training;Stair training;Neuromuscular re-education;Functional mobility training;Therapeutic activities;Patient/family education    PT Goals (Current goals can be found in the Care Plan section)  Acute Rehab PT Goals Patient Stated Goal: go home and recover PT Goal Formulation: With patient Time For Goal Achievement: 07/23/19 Potential to Achieve Goals: Good    Frequency BID   Barriers to discharge        Co-evaluation               AM-PAC PT "6 Clicks" Mobility  Outcome Measure Help needed turning from your back to your side while in a flat bed without using bedrails?: None Help needed moving from lying on your back to sitting on the side of a flat bed without using bedrails?:  None Help needed moving to and from a bed to a chair (including a wheelchair)?: A Little Help needed standing up from a chair using your arms (e.g., wheelchair or bedside chair)?: A Little Help needed to walk in hospital room?: A Little Help needed climbing 3-5 steps with a railing? : A Little 6 Click Score: 20    End of Session Equipment Utilized During Treatment: Gait belt Activity Tolerance: Patient tolerated treatment well Patient left: in chair(in pre op) Nurse Communication: Mobility status PT Visit Diagnosis: Difficulty in walking, not elsewhere classified (R26.2);Pain Pain - Right/Left: Left Pain - part of body: Knee    Time:  1741-1820 PT Time Calculation (min) (ACUTE ONLY): 39 min   Charges:   PT Evaluation $PT Eval Moderate Complexity: 1 Mod PT Treatments $Therapeutic Activity: 23-37 mins        Zachary George PT, DPT 6:45 PM,07/09/19 [id number]   Mithran Strike Drucilla Chalet 07/09/2019, 6:38 PM

## 2019-07-09 NOTE — H&P (Signed)
The patient has been re-examined, and the chart reviewed, and there have been no interval changes to the documented history and physical.    The risks, benefits, and alternatives have been discussed at length. The patient expressed understanding of the risks benefits and agreed with plans for surgical intervention.  Escher Harr P. Aeden Matranga, Jr. M.D.    

## 2019-07-10 NOTE — Anesthesia Postprocedure Evaluation (Signed)
Anesthesia Post Note  Patient: Rachael Brewer  Procedure(s) Performed: COMPUTER ASSISTED TOTAL KNEE ARTHROPLASTY (Left Knee)  Patient location during evaluation: PACU Anesthesia Type: Spinal Level of consciousness: oriented and awake and alert Pain management: pain level controlled Vital Signs Assessment: post-procedure vital signs reviewed and stable Respiratory status: spontaneous breathing, respiratory function stable and nonlabored ventilation Cardiovascular status: blood pressure returned to baseline and stable Postop Assessment: no headache, no backache and spinal receding Anesthetic complications: no     Last Vitals:  Vitals:   07/09/19 1915 07/09/19 1930  BP: 132/74 132/74  Pulse: 91 91  Resp: 17 17  Temp: (!) 36.3 C (!) 36.3 C  SpO2: 97% 97%    Last Pain:  Vitals:   07/09/19 1930  TempSrc: Temporal  PainSc: 3                  Asmar Brozek

## 2019-07-11 ENCOUNTER — Encounter: Payer: BC Managed Care – PPO | Admitting: Podiatry

## 2019-07-11 DIAGNOSIS — K219 Gastro-esophageal reflux disease without esophagitis: Secondary | ICD-10-CM | POA: Diagnosis not present

## 2019-07-11 DIAGNOSIS — F419 Anxiety disorder, unspecified: Secondary | ICD-10-CM | POA: Diagnosis not present

## 2019-07-11 DIAGNOSIS — H814 Vertigo of central origin: Secondary | ICD-10-CM | POA: Diagnosis not present

## 2019-07-11 DIAGNOSIS — I890 Lymphedema, not elsewhere classified: Secondary | ICD-10-CM | POA: Diagnosis not present

## 2019-07-11 DIAGNOSIS — R7303 Prediabetes: Secondary | ICD-10-CM | POA: Diagnosis not present

## 2019-07-11 DIAGNOSIS — G43909 Migraine, unspecified, not intractable, without status migrainosus: Secondary | ICD-10-CM | POA: Diagnosis not present

## 2019-07-11 DIAGNOSIS — F339 Major depressive disorder, recurrent, unspecified: Secondary | ICD-10-CM | POA: Diagnosis not present

## 2019-07-11 DIAGNOSIS — K648 Other hemorrhoids: Secondary | ICD-10-CM | POA: Diagnosis not present

## 2019-07-11 DIAGNOSIS — I872 Venous insufficiency (chronic) (peripheral): Secondary | ICD-10-CM | POA: Diagnosis not present

## 2019-07-11 DIAGNOSIS — E669 Obesity, unspecified: Secondary | ICD-10-CM | POA: Diagnosis not present

## 2019-07-11 DIAGNOSIS — M722 Plantar fascial fibromatosis: Secondary | ICD-10-CM | POA: Diagnosis not present

## 2019-07-11 DIAGNOSIS — E7800 Pure hypercholesterolemia, unspecified: Secondary | ICD-10-CM | POA: Diagnosis not present

## 2019-07-11 DIAGNOSIS — J45909 Unspecified asthma, uncomplicated: Secondary | ICD-10-CM | POA: Diagnosis not present

## 2019-07-11 DIAGNOSIS — Z471 Aftercare following joint replacement surgery: Secondary | ICD-10-CM | POA: Diagnosis not present

## 2019-07-11 DIAGNOSIS — G4730 Sleep apnea, unspecified: Secondary | ICD-10-CM | POA: Diagnosis not present

## 2019-07-11 DIAGNOSIS — J309 Allergic rhinitis, unspecified: Secondary | ICD-10-CM | POA: Diagnosis not present

## 2019-07-12 DIAGNOSIS — K219 Gastro-esophageal reflux disease without esophagitis: Secondary | ICD-10-CM | POA: Diagnosis not present

## 2019-07-12 DIAGNOSIS — G4730 Sleep apnea, unspecified: Secondary | ICD-10-CM | POA: Diagnosis not present

## 2019-07-12 DIAGNOSIS — J45909 Unspecified asthma, uncomplicated: Secondary | ICD-10-CM | POA: Diagnosis not present

## 2019-07-12 DIAGNOSIS — K648 Other hemorrhoids: Secondary | ICD-10-CM | POA: Diagnosis not present

## 2019-07-12 DIAGNOSIS — H814 Vertigo of central origin: Secondary | ICD-10-CM | POA: Diagnosis not present

## 2019-07-12 DIAGNOSIS — R7303 Prediabetes: Secondary | ICD-10-CM | POA: Diagnosis not present

## 2019-07-12 DIAGNOSIS — J309 Allergic rhinitis, unspecified: Secondary | ICD-10-CM | POA: Diagnosis not present

## 2019-07-12 DIAGNOSIS — F419 Anxiety disorder, unspecified: Secondary | ICD-10-CM | POA: Diagnosis not present

## 2019-07-12 DIAGNOSIS — M722 Plantar fascial fibromatosis: Secondary | ICD-10-CM | POA: Diagnosis not present

## 2019-07-12 DIAGNOSIS — Z471 Aftercare following joint replacement surgery: Secondary | ICD-10-CM | POA: Diagnosis not present

## 2019-07-12 DIAGNOSIS — G43909 Migraine, unspecified, not intractable, without status migrainosus: Secondary | ICD-10-CM | POA: Diagnosis not present

## 2019-07-12 DIAGNOSIS — F339 Major depressive disorder, recurrent, unspecified: Secondary | ICD-10-CM | POA: Diagnosis not present

## 2019-07-12 DIAGNOSIS — E7800 Pure hypercholesterolemia, unspecified: Secondary | ICD-10-CM | POA: Diagnosis not present

## 2019-07-12 DIAGNOSIS — E669 Obesity, unspecified: Secondary | ICD-10-CM | POA: Diagnosis not present

## 2019-07-12 DIAGNOSIS — I872 Venous insufficiency (chronic) (peripheral): Secondary | ICD-10-CM | POA: Diagnosis not present

## 2019-07-12 DIAGNOSIS — I890 Lymphedema, not elsewhere classified: Secondary | ICD-10-CM | POA: Diagnosis not present

## 2019-07-13 DIAGNOSIS — K219 Gastro-esophageal reflux disease without esophagitis: Secondary | ICD-10-CM | POA: Diagnosis not present

## 2019-07-13 DIAGNOSIS — E7800 Pure hypercholesterolemia, unspecified: Secondary | ICD-10-CM | POA: Diagnosis not present

## 2019-07-13 DIAGNOSIS — J45909 Unspecified asthma, uncomplicated: Secondary | ICD-10-CM | POA: Diagnosis not present

## 2019-07-13 DIAGNOSIS — M722 Plantar fascial fibromatosis: Secondary | ICD-10-CM | POA: Diagnosis not present

## 2019-07-13 DIAGNOSIS — I872 Venous insufficiency (chronic) (peripheral): Secondary | ICD-10-CM | POA: Diagnosis not present

## 2019-07-13 DIAGNOSIS — F419 Anxiety disorder, unspecified: Secondary | ICD-10-CM | POA: Diagnosis not present

## 2019-07-13 DIAGNOSIS — E669 Obesity, unspecified: Secondary | ICD-10-CM | POA: Diagnosis not present

## 2019-07-13 DIAGNOSIS — R7303 Prediabetes: Secondary | ICD-10-CM | POA: Diagnosis not present

## 2019-07-13 DIAGNOSIS — Z471 Aftercare following joint replacement surgery: Secondary | ICD-10-CM | POA: Diagnosis not present

## 2019-07-13 DIAGNOSIS — J309 Allergic rhinitis, unspecified: Secondary | ICD-10-CM | POA: Diagnosis not present

## 2019-07-13 DIAGNOSIS — I890 Lymphedema, not elsewhere classified: Secondary | ICD-10-CM | POA: Diagnosis not present

## 2019-07-13 DIAGNOSIS — K648 Other hemorrhoids: Secondary | ICD-10-CM | POA: Diagnosis not present

## 2019-07-13 DIAGNOSIS — H814 Vertigo of central origin: Secondary | ICD-10-CM | POA: Diagnosis not present

## 2019-07-13 DIAGNOSIS — F339 Major depressive disorder, recurrent, unspecified: Secondary | ICD-10-CM | POA: Diagnosis not present

## 2019-07-13 DIAGNOSIS — G43909 Migraine, unspecified, not intractable, without status migrainosus: Secondary | ICD-10-CM | POA: Diagnosis not present

## 2019-07-13 DIAGNOSIS — G4730 Sleep apnea, unspecified: Secondary | ICD-10-CM | POA: Diagnosis not present

## 2019-07-16 DIAGNOSIS — K219 Gastro-esophageal reflux disease without esophagitis: Secondary | ICD-10-CM | POA: Diagnosis not present

## 2019-07-16 DIAGNOSIS — J309 Allergic rhinitis, unspecified: Secondary | ICD-10-CM | POA: Diagnosis not present

## 2019-07-16 DIAGNOSIS — H814 Vertigo of central origin: Secondary | ICD-10-CM | POA: Diagnosis not present

## 2019-07-16 DIAGNOSIS — E669 Obesity, unspecified: Secondary | ICD-10-CM | POA: Diagnosis not present

## 2019-07-16 DIAGNOSIS — J45909 Unspecified asthma, uncomplicated: Secondary | ICD-10-CM | POA: Diagnosis not present

## 2019-07-16 DIAGNOSIS — F419 Anxiety disorder, unspecified: Secondary | ICD-10-CM | POA: Diagnosis not present

## 2019-07-16 DIAGNOSIS — I872 Venous insufficiency (chronic) (peripheral): Secondary | ICD-10-CM | POA: Diagnosis not present

## 2019-07-16 DIAGNOSIS — R7303 Prediabetes: Secondary | ICD-10-CM | POA: Diagnosis not present

## 2019-07-16 DIAGNOSIS — Z471 Aftercare following joint replacement surgery: Secondary | ICD-10-CM | POA: Diagnosis not present

## 2019-07-16 DIAGNOSIS — E7800 Pure hypercholesterolemia, unspecified: Secondary | ICD-10-CM | POA: Diagnosis not present

## 2019-07-16 DIAGNOSIS — M722 Plantar fascial fibromatosis: Secondary | ICD-10-CM | POA: Diagnosis not present

## 2019-07-16 DIAGNOSIS — I890 Lymphedema, not elsewhere classified: Secondary | ICD-10-CM | POA: Diagnosis not present

## 2019-07-16 DIAGNOSIS — F339 Major depressive disorder, recurrent, unspecified: Secondary | ICD-10-CM | POA: Diagnosis not present

## 2019-07-16 DIAGNOSIS — G43909 Migraine, unspecified, not intractable, without status migrainosus: Secondary | ICD-10-CM | POA: Diagnosis not present

## 2019-07-16 DIAGNOSIS — K648 Other hemorrhoids: Secondary | ICD-10-CM | POA: Diagnosis not present

## 2019-07-16 DIAGNOSIS — G4730 Sleep apnea, unspecified: Secondary | ICD-10-CM | POA: Diagnosis not present

## 2019-07-18 DIAGNOSIS — J309 Allergic rhinitis, unspecified: Secondary | ICD-10-CM | POA: Diagnosis not present

## 2019-07-18 DIAGNOSIS — F339 Major depressive disorder, recurrent, unspecified: Secondary | ICD-10-CM | POA: Diagnosis not present

## 2019-07-18 DIAGNOSIS — G4730 Sleep apnea, unspecified: Secondary | ICD-10-CM | POA: Diagnosis not present

## 2019-07-18 DIAGNOSIS — K648 Other hemorrhoids: Secondary | ICD-10-CM | POA: Diagnosis not present

## 2019-07-18 DIAGNOSIS — J45909 Unspecified asthma, uncomplicated: Secondary | ICD-10-CM | POA: Diagnosis not present

## 2019-07-18 DIAGNOSIS — K219 Gastro-esophageal reflux disease without esophagitis: Secondary | ICD-10-CM | POA: Diagnosis not present

## 2019-07-18 DIAGNOSIS — E669 Obesity, unspecified: Secondary | ICD-10-CM | POA: Diagnosis not present

## 2019-07-18 DIAGNOSIS — M722 Plantar fascial fibromatosis: Secondary | ICD-10-CM | POA: Diagnosis not present

## 2019-07-18 DIAGNOSIS — G43909 Migraine, unspecified, not intractable, without status migrainosus: Secondary | ICD-10-CM | POA: Diagnosis not present

## 2019-07-18 DIAGNOSIS — I890 Lymphedema, not elsewhere classified: Secondary | ICD-10-CM | POA: Diagnosis not present

## 2019-07-18 DIAGNOSIS — R7303 Prediabetes: Secondary | ICD-10-CM | POA: Diagnosis not present

## 2019-07-18 DIAGNOSIS — E7800 Pure hypercholesterolemia, unspecified: Secondary | ICD-10-CM | POA: Diagnosis not present

## 2019-07-18 DIAGNOSIS — H814 Vertigo of central origin: Secondary | ICD-10-CM | POA: Diagnosis not present

## 2019-07-18 DIAGNOSIS — I872 Venous insufficiency (chronic) (peripheral): Secondary | ICD-10-CM | POA: Diagnosis not present

## 2019-07-18 DIAGNOSIS — F419 Anxiety disorder, unspecified: Secondary | ICD-10-CM | POA: Diagnosis not present

## 2019-07-18 DIAGNOSIS — Z471 Aftercare following joint replacement surgery: Secondary | ICD-10-CM | POA: Diagnosis not present

## 2019-07-19 DIAGNOSIS — E669 Obesity, unspecified: Secondary | ICD-10-CM | POA: Diagnosis not present

## 2019-07-19 DIAGNOSIS — G4730 Sleep apnea, unspecified: Secondary | ICD-10-CM | POA: Diagnosis not present

## 2019-07-19 DIAGNOSIS — F419 Anxiety disorder, unspecified: Secondary | ICD-10-CM | POA: Diagnosis not present

## 2019-07-19 DIAGNOSIS — F339 Major depressive disorder, recurrent, unspecified: Secondary | ICD-10-CM | POA: Diagnosis not present

## 2019-07-19 DIAGNOSIS — G43909 Migraine, unspecified, not intractable, without status migrainosus: Secondary | ICD-10-CM | POA: Diagnosis not present

## 2019-07-19 DIAGNOSIS — H814 Vertigo of central origin: Secondary | ICD-10-CM | POA: Diagnosis not present

## 2019-07-19 DIAGNOSIS — M722 Plantar fascial fibromatosis: Secondary | ICD-10-CM | POA: Diagnosis not present

## 2019-07-19 DIAGNOSIS — R7303 Prediabetes: Secondary | ICD-10-CM | POA: Diagnosis not present

## 2019-07-19 DIAGNOSIS — E7800 Pure hypercholesterolemia, unspecified: Secondary | ICD-10-CM | POA: Diagnosis not present

## 2019-07-19 DIAGNOSIS — J45909 Unspecified asthma, uncomplicated: Secondary | ICD-10-CM | POA: Diagnosis not present

## 2019-07-19 DIAGNOSIS — J309 Allergic rhinitis, unspecified: Secondary | ICD-10-CM | POA: Diagnosis not present

## 2019-07-19 DIAGNOSIS — I872 Venous insufficiency (chronic) (peripheral): Secondary | ICD-10-CM | POA: Diagnosis not present

## 2019-07-19 DIAGNOSIS — Z471 Aftercare following joint replacement surgery: Secondary | ICD-10-CM | POA: Diagnosis not present

## 2019-07-19 DIAGNOSIS — K219 Gastro-esophageal reflux disease without esophagitis: Secondary | ICD-10-CM | POA: Diagnosis not present

## 2019-07-19 DIAGNOSIS — I890 Lymphedema, not elsewhere classified: Secondary | ICD-10-CM | POA: Diagnosis not present

## 2019-07-19 DIAGNOSIS — K648 Other hemorrhoids: Secondary | ICD-10-CM | POA: Diagnosis not present

## 2019-07-23 ENCOUNTER — Encounter: Payer: BC Managed Care – PPO | Admitting: Podiatry

## 2019-07-23 DIAGNOSIS — E7800 Pure hypercholesterolemia, unspecified: Secondary | ICD-10-CM | POA: Diagnosis not present

## 2019-07-23 DIAGNOSIS — J45909 Unspecified asthma, uncomplicated: Secondary | ICD-10-CM | POA: Diagnosis not present

## 2019-07-23 DIAGNOSIS — H814 Vertigo of central origin: Secondary | ICD-10-CM | POA: Diagnosis not present

## 2019-07-23 DIAGNOSIS — F339 Major depressive disorder, recurrent, unspecified: Secondary | ICD-10-CM | POA: Diagnosis not present

## 2019-07-23 DIAGNOSIS — F419 Anxiety disorder, unspecified: Secondary | ICD-10-CM | POA: Diagnosis not present

## 2019-07-23 DIAGNOSIS — Z471 Aftercare following joint replacement surgery: Secondary | ICD-10-CM | POA: Diagnosis not present

## 2019-07-23 DIAGNOSIS — I872 Venous insufficiency (chronic) (peripheral): Secondary | ICD-10-CM | POA: Diagnosis not present

## 2019-07-23 DIAGNOSIS — J309 Allergic rhinitis, unspecified: Secondary | ICD-10-CM | POA: Diagnosis not present

## 2019-07-23 DIAGNOSIS — I890 Lymphedema, not elsewhere classified: Secondary | ICD-10-CM | POA: Diagnosis not present

## 2019-07-23 DIAGNOSIS — K219 Gastro-esophageal reflux disease without esophagitis: Secondary | ICD-10-CM | POA: Diagnosis not present

## 2019-07-23 DIAGNOSIS — G43909 Migraine, unspecified, not intractable, without status migrainosus: Secondary | ICD-10-CM | POA: Diagnosis not present

## 2019-07-23 DIAGNOSIS — E669 Obesity, unspecified: Secondary | ICD-10-CM | POA: Diagnosis not present

## 2019-07-23 DIAGNOSIS — M722 Plantar fascial fibromatosis: Secondary | ICD-10-CM | POA: Diagnosis not present

## 2019-07-23 DIAGNOSIS — G4730 Sleep apnea, unspecified: Secondary | ICD-10-CM | POA: Diagnosis not present

## 2019-07-23 DIAGNOSIS — R7303 Prediabetes: Secondary | ICD-10-CM | POA: Diagnosis not present

## 2019-07-23 DIAGNOSIS — K648 Other hemorrhoids: Secondary | ICD-10-CM | POA: Diagnosis not present

## 2019-07-25 DIAGNOSIS — Z471 Aftercare following joint replacement surgery: Secondary | ICD-10-CM | POA: Diagnosis not present

## 2019-07-25 DIAGNOSIS — J309 Allergic rhinitis, unspecified: Secondary | ICD-10-CM | POA: Diagnosis not present

## 2019-07-25 DIAGNOSIS — F419 Anxiety disorder, unspecified: Secondary | ICD-10-CM | POA: Diagnosis not present

## 2019-07-25 DIAGNOSIS — G4730 Sleep apnea, unspecified: Secondary | ICD-10-CM | POA: Diagnosis not present

## 2019-07-25 DIAGNOSIS — E669 Obesity, unspecified: Secondary | ICD-10-CM | POA: Diagnosis not present

## 2019-07-25 DIAGNOSIS — H814 Vertigo of central origin: Secondary | ICD-10-CM | POA: Diagnosis not present

## 2019-07-25 DIAGNOSIS — E7800 Pure hypercholesterolemia, unspecified: Secondary | ICD-10-CM | POA: Diagnosis not present

## 2019-07-25 DIAGNOSIS — I872 Venous insufficiency (chronic) (peripheral): Secondary | ICD-10-CM | POA: Diagnosis not present

## 2019-07-25 DIAGNOSIS — R7303 Prediabetes: Secondary | ICD-10-CM | POA: Diagnosis not present

## 2019-07-25 DIAGNOSIS — I890 Lymphedema, not elsewhere classified: Secondary | ICD-10-CM | POA: Diagnosis not present

## 2019-07-25 DIAGNOSIS — J45909 Unspecified asthma, uncomplicated: Secondary | ICD-10-CM | POA: Diagnosis not present

## 2019-07-25 DIAGNOSIS — G43909 Migraine, unspecified, not intractable, without status migrainosus: Secondary | ICD-10-CM | POA: Diagnosis not present

## 2019-07-25 DIAGNOSIS — K648 Other hemorrhoids: Secondary | ICD-10-CM | POA: Diagnosis not present

## 2019-07-25 DIAGNOSIS — M722 Plantar fascial fibromatosis: Secondary | ICD-10-CM | POA: Diagnosis not present

## 2019-07-25 DIAGNOSIS — K219 Gastro-esophageal reflux disease without esophagitis: Secondary | ICD-10-CM | POA: Diagnosis not present

## 2019-07-25 DIAGNOSIS — F339 Major depressive disorder, recurrent, unspecified: Secondary | ICD-10-CM | POA: Diagnosis not present

## 2019-07-27 DIAGNOSIS — Z96652 Presence of left artificial knee joint: Secondary | ICD-10-CM | POA: Diagnosis not present

## 2019-07-31 DIAGNOSIS — Z96652 Presence of left artificial knee joint: Secondary | ICD-10-CM | POA: Diagnosis not present

## 2019-08-06 ENCOUNTER — Encounter: Payer: Self-pay | Admitting: Podiatry

## 2019-08-06 ENCOUNTER — Encounter: Payer: BC Managed Care – PPO | Admitting: Podiatry

## 2019-08-08 DIAGNOSIS — Z96652 Presence of left artificial knee joint: Secondary | ICD-10-CM | POA: Diagnosis not present

## 2019-08-10 DIAGNOSIS — Z96652 Presence of left artificial knee joint: Secondary | ICD-10-CM | POA: Diagnosis not present

## 2019-08-13 DIAGNOSIS — Z96652 Presence of left artificial knee joint: Secondary | ICD-10-CM | POA: Diagnosis not present

## 2019-08-15 ENCOUNTER — Encounter: Payer: BC Managed Care – PPO | Admitting: Podiatry

## 2019-08-15 DIAGNOSIS — Z96652 Presence of left artificial knee joint: Secondary | ICD-10-CM | POA: Diagnosis not present

## 2019-08-20 ENCOUNTER — Encounter: Payer: BC Managed Care – PPO | Admitting: Podiatry

## 2019-08-20 DIAGNOSIS — Z96652 Presence of left artificial knee joint: Secondary | ICD-10-CM | POA: Diagnosis not present

## 2019-08-28 DIAGNOSIS — Z96652 Presence of left artificial knee joint: Secondary | ICD-10-CM | POA: Diagnosis not present

## 2019-08-30 DIAGNOSIS — M25562 Pain in left knee: Secondary | ICD-10-CM | POA: Diagnosis not present

## 2019-08-30 DIAGNOSIS — Z96652 Presence of left artificial knee joint: Secondary | ICD-10-CM | POA: Diagnosis not present

## 2019-08-30 DIAGNOSIS — M1712 Unilateral primary osteoarthritis, left knee: Secondary | ICD-10-CM | POA: Diagnosis not present

## 2019-08-31 ENCOUNTER — Other Ambulatory Visit: Payer: Self-pay

## 2019-08-31 ENCOUNTER — Encounter: Payer: Self-pay | Admitting: Family Medicine

## 2019-08-31 ENCOUNTER — Ambulatory Visit: Payer: BC Managed Care – PPO | Admitting: Family Medicine

## 2019-08-31 VITALS — BP 140/70 | HR 72 | Temp 97.3°F | Resp 16 | Ht 63.0 in | Wt 252.0 lb

## 2019-08-31 DIAGNOSIS — Z1152 Encounter for screening for COVID-19: Secondary | ICD-10-CM | POA: Diagnosis not present

## 2019-08-31 DIAGNOSIS — Z01818 Encounter for other preprocedural examination: Secondary | ICD-10-CM | POA: Diagnosis not present

## 2019-08-31 MED ORDER — BUTALBITAL-APAP-CAFFEINE 50-325-40 MG PO TABS: 1.0000 | ORAL_TABLET | Freq: Four times a day (QID) | ORAL | 0 refills | Status: AC | PRN

## 2019-08-31 NOTE — Progress Notes (Signed)
Patient: Rachael Brewer Female    DOB: [birth year]   59 y.o.   MRN: [id number] Visit Date: 08/31/2019  Today's Provider: Lavon Paganini, MD   Chief Complaint  Patient presents with  . Pre-op Exam   Subjective:      Subjective:  Rachael Brewer is a 59 y.o. female who presents to the office today for a preoperative consultation at the request of surgeon Max Milinda Pointer who plans on performing left foot surgery on March 19. This consultation is requested for the specific conditions prompting preoperative evaluation (i.e. because of potential affect on operative risk): HTN. Planned anesthesia is general. The patient has the following known anesthesia issues: patient denies any problems. Patient has a bleeding risk of: no recent abnormal bleeding. Patient does not have objections to receiving blood products if needed.     Pt is a 59 y.o. female who is here for preoperative clearance for L foot surgery  1) High Risk Cardiac Conditions  1) Recent MI - No.  2) Decompensated Heart Failure - No.  3) Unstable angina - No.  4) Symptomatic arrythmia - No.  5) Sx Valvular Disease - No.  2) Intermediate Risk Factors - DM, CKD, CVA, CHF, CAD - No.  2) Functional Status - > 4 mets (Walk, run, climb stairs) Yes.  Rachael Brewer Activity Status Index: 50.2 (8.91 METS)  3) Surgery Specific Risk -  Intermediate (Orthopaedic )  4) Further Noninvasive evaluation -   1) EKG - No.   1) Hx of CVA, CAD, DM, CKD  2) Echo - No.   1) Worsening dyspnea   3) Stress Testing - Active Cardiac Disease - No.  5) Need for medical therapy - Beta Blocker, Statins indicated ? No.   Allergies  Allergen Reactions  . Bactrim [Sulfamethoxazole-Trimethoprim] Shortness Of Breath and Swelling  . Sulfa Antibiotics Anaphylaxis  . Sumatriptan     Made migraine more intense and scalp felt like it was on fire  . Penicillins Hives    Did it involve swelling of the face/tongue/throat, SOB, or low BP? No Did it  involve sudden or severe rash/hives, skin peeling, or any reaction on the inside of your mouth or nose? No Did you need to seek medical attention at a hospital or doctor's office? No When did it last happen?10 years ago If all above answers are "NO", may proceed with cephalosporin use.      Current Outpatient Medications:  .  albuterol (VENTOLIN HFA) 108 (90 Base) MCG/ACT inhaler, Inhale 1-2 puffs into the lungs every 6 (six) hours as needed for wheezing or shortness of breath., Disp: 1 Inhaler, Rfl: 3 .  aspirin-acetaminophen-caffeine (EXCEDRIN MIGRAINE) 250-250-65 MG tablet, Take 2 tablets by mouth every 8 (eight) hours as needed for migraine. , Disp: , Rfl:  .  Azelastine HCl 137 MCG/SPRAY SOLN, Place 1 spray into both nostrils at bedtime. , Disp: , Rfl:  .  baclofen (LIORESAL) 10 MG tablet, Take 10 mg by mouth 2 (two) times daily as needed for muscle spasms. LIMIT 2 DAYS PER WEEK, Disp: , Rfl:  .  bisacodyl (DULCOLAX) 5 MG EC tablet, Take 10 mg by mouth 2 (two) times daily., Disp: , Rfl:  .  buPROPion (WELLBUTRIN) 100 MG tablet, TAKE 1 TABLET BY MOUTH TWICE A DAY (Patient taking differently: Take 100 mg by mouth 2 (two) times daily. Morning & lunch), Disp: 180 tablet, Rfl: 1 .  celecoxib (CELEBREX) 200 MG capsule, Take 1 capsule (  200 mg total) by mouth 2 (two) times daily., Disp: 60 capsule, Rfl: 2 .  escitalopram (LEXAPRO) 20 MG tablet, TAKE 1 TABLET BY MOUTH EVERY DAY (Patient taking differently: Take 20 mg by mouth daily. ), Disp: 30 tablet, Rfl: 3 .  gabapentin (NEURONTIN) 600 MG tablet, Take 600 mg by mouth 2 (two) times daily. , Disp: , Rfl:  .  HYDROcodone-acetaminophen (NORCO/VICODIN) 5-325 MG tablet, Take by mouth., Disp: , Rfl:  .  Multiple Vitamin (MULTIVITAMIN WITH MINERALS) TABS tablet, Take 1 tablet by mouth daily., Disp: , Rfl:  .  omeprazole (PRILOSEC) 40 MG capsule, Take 1 capsule (40 mg total) by mouth daily., Disp: 90 capsule, Rfl: 2 .  Potassium Chloride 40 MEQ/15ML  (20%) SOLN, Take 40 mEq by mouth daily., Disp: 473 mL, Rfl: 3 .  rizatriptan (MAXALT) 10 MG tablet, Take 10 mg by mouth every 2 (two) hours as needed for migraine (max 2 tablets/24 hrs.). , Disp: , Rfl: 0 .  topiramate ER (QUDEXY XR) 150 MG CS24 sprinkle capsule, Take 150 mg by mouth at bedtime. , Disp: , Rfl:  .  torsemide (DEMADEX) 20 MG tablet, TAKE 2 TABLETS BY MOUTH EVERY DAY (Patient taking differently: Take 40 mg by mouth daily. ), Disp: 60 tablet, Rfl: 3 .  traMADol (ULTRAM) 50 MG tablet, Take 1 tablet (50 mg total) by mouth every 6 (six) hours as needed for moderate pain., Disp: 30 tablet, Rfl: 0 .  vitamin B-12 (CYANOCOBALAMIN) 1000 MCG tablet, Take 500 mcg by mouth daily., Disp: , Rfl:  .  enoxaparin (LOVENOX) 40 MG/0.4ML injection, Inject 0.4 mLs (40 mg total) into the skin daily. (Patient not taking: Reported on 08/31/2019), Disp: 5.6 mL, Rfl: 0 .  oxyCODONE (ROXICODONE) 5 MG immediate release tablet, Take 1-2 tablets (5-10 mg total) by mouth every 4 (four) hours as needed for severe pain. (Patient not taking: Reported on 08/31/2019), Disp: 30 tablet, Rfl: 0  Review of Systems  Constitutional: Negative.   Respiratory: Negative.   Cardiovascular: Negative.   Genitourinary: Negative.   Neurological: Negative.   Psychiatric/Behavioral: Negative.     Social History   Tobacco Use  . Smoking status: Former Smoker    Packs/day: 1.00    Years: 15.00    Pack years: 15.00    Quit date: 06/21/1999    Years since quitting: 20.2  . Smokeless tobacco: Never Used  Substance Use Topics  . Alcohol use: Yes    Alcohol/week: 0.0 standard drinks    Comment: occasional      Objective:   BP 140/70 (BP Location: Left Arm, Patient Position: Sitting, Cuff Size: Large)   Pulse 72   Temp (!) 97.3 F (36.3 C) (Temporal)   Resp 16   Ht 5\' 3"  (1.6 m)   Wt 252 lb (114.3 kg)   LMP  (LMP Unknown)   SpO2 96%   BMI 44.64 kg/m  Vitals:   08/31/19 1341  BP: 140/70  Pulse: 72  Resp: 16   Temp: (!) 97.3 F (36.3 C)  TempSrc: Temporal  SpO2: 96%  Weight: 252 lb (114.3 kg)  Height: 5\' 3"  (1.6 m)  Body mass index is 44.64 kg/m.   Physical Exam Vitals reviewed.  Constitutional:      General: She is not in acute distress.    Appearance: Normal appearance. She is well-developed. She is not diaphoretic.  HENT:     Head: Normocephalic and atraumatic.  Eyes:     General: No scleral icterus.  Conjunctiva/sclera: Conjunctivae normal.  Neck:     Thyroid: No thyromegaly.  Cardiovascular:     Rate and Rhythm: Normal rate and regular rhythm.     Pulses: Normal pulses.     Heart sounds: Normal heart sounds. No murmur.  Pulmonary:     Effort: Pulmonary effort is normal. No respiratory distress.     Breath sounds: Normal breath sounds. No wheezing, rhonchi or rales.  Musculoskeletal:     Cervical back: Neck supple.     Right lower leg: No edema.     Left lower leg: No edema.  Lymphadenopathy:     Cervical: No cervical adenopathy.  Skin:    General: Skin is warm and dry.     Findings: No rash.  Neurological:     Mental Status: She is alert and oriented to person, place, and time. Mental status is at baseline.  Psychiatric:        Mood and Affect: Mood normal.        Behavior: Behavior normal.      No results found for any visits on 08/31/19. EKG: NSR with 1st degree AV block, unchanged from previous, non-ischemic    Assessment & Plan     I have independently evaluated patient.  KEYMIAH PORCAYO is a 59 y.o. female who is low risk for a intermediate risk surgery.  There are not modifiable risk factors (smoking, etc). Rachael Brewer's RCRI/NSQIP calculation for MACE is: 2.6% (below average).    - no additional cardiac testing required - reviewed CBC, CMP from January - wnl - she is medically stable for surgery  - will send copy to Podiatry - hold torsemide on morning of surgery - hold aspirin and NSAIDs for 5 days prior to surgery  The entirety of the  information documented in the History of Present Illness, Review of Systems and Physical Exam were personally obtained by me. Portions of this information were initially documented by Rachael Brewer, CMA and reviewed by me for thoroughness and accuracy.    Jaqlyn Gruenhagen, Dionne Bucy, MD, MPH [facility]

## 2019-09-01 DIAGNOSIS — Z471 Aftercare following joint replacement surgery: Secondary | ICD-10-CM | POA: Diagnosis not present

## 2019-09-03 ENCOUNTER — Encounter: Payer: Self-pay | Admitting: Podiatry

## 2019-09-06 ENCOUNTER — Other Ambulatory Visit: Payer: Self-pay | Admitting: Podiatry

## 2019-09-06 MED ORDER — ONDANSETRON HCL 4 MG PO TABS: 4.0000 mg | ORAL_TABLET | Freq: Three times a day (TID) | ORAL | 0 refills | Status: DC | PRN

## 2019-09-06 MED ORDER — OXYCODONE-ACETAMINOPHEN 10-325 MG PO TABS: 1.0000 | ORAL_TABLET | Freq: Three times a day (TID) | ORAL | 0 refills | Status: AC | PRN

## 2019-09-06 MED ORDER — CLINDAMYCIN HCL 150 MG PO CAPS: 150.0000 mg | ORAL_CAPSULE | Freq: Three times a day (TID) | ORAL | 0 refills | Status: DC

## 2019-09-06 NOTE — Progress Notes (Signed)
Dr. Brita Romp,  I never received a fax but saw your office visit note and with the clearance and medication directions. I printed that out and gave to Dr. Milinda Pointer for her clearance. Thank you for reaching back out to me. I hope you have a wonderful day.  Best,  Janett Billow

## 2019-09-07 ENCOUNTER — Encounter: Payer: Self-pay | Admitting: Podiatry

## 2019-09-07 DIAGNOSIS — M722 Plantar fascial fibromatosis: Secondary | ICD-10-CM | POA: Diagnosis not present

## 2019-09-07 DIAGNOSIS — M71372 Other bursal cyst, left ankle and foot: Secondary | ICD-10-CM | POA: Diagnosis not present

## 2019-09-07 DIAGNOSIS — I10 Essential (primary) hypertension: Secondary | ICD-10-CM | POA: Diagnosis not present

## 2019-09-07 DIAGNOSIS — M25572 Pain in left ankle and joints of left foot: Secondary | ICD-10-CM | POA: Diagnosis not present

## 2019-09-07 DIAGNOSIS — D1724 Benign lipomatous neoplasm of skin and subcutaneous tissue of left leg: Secondary | ICD-10-CM | POA: Diagnosis not present

## 2019-09-07 DIAGNOSIS — M7662 Achilles tendinitis, left leg: Secondary | ICD-10-CM | POA: Diagnosis not present

## 2019-09-07 DIAGNOSIS — M76822 Posterior tibial tendinitis, left leg: Secondary | ICD-10-CM | POA: Diagnosis not present

## 2019-09-12 ENCOUNTER — Encounter: Payer: Self-pay | Admitting: Podiatry

## 2019-09-12 ENCOUNTER — Other Ambulatory Visit: Payer: Self-pay

## 2019-09-12 ENCOUNTER — Ambulatory Visit (INDEPENDENT_AMBULATORY_CARE_PROVIDER_SITE_OTHER): Payer: BC Managed Care – PPO | Admitting: Podiatry

## 2019-09-12 DIAGNOSIS — Z9889 Other specified postprocedural states: Secondary | ICD-10-CM

## 2019-09-12 DIAGNOSIS — M76822 Posterior tibial tendinitis, left leg: Secondary | ICD-10-CM

## 2019-09-12 DIAGNOSIS — M722 Plantar fascial fibromatosis: Secondary | ICD-10-CM

## 2019-09-12 DIAGNOSIS — M7752 Other enthesopathy of left foot: Secondary | ICD-10-CM

## 2019-09-12 DIAGNOSIS — G4733 Obstructive sleep apnea (adult) (pediatric): Secondary | ICD-10-CM | POA: Diagnosis not present

## 2019-09-12 NOTE — Progress Notes (Signed)
She presents today for her first postop visit date of surgery 09/07/2019 status post total EPF left sinus tarsectomy as well as a evaluation exploration of the posterior tibial tendon.  She denies fever chills nausea vomiting muscle aches pains calf pain back pain chest pain shortness of breath states that she was constipated for 4 days.  She states that she is doing better now.  Objective: Vital signs are stable alert oriented x3.  Dressed her dressing intact was removed demonstrates minimal edema no erythema cellulitis drainage or odor.  Sutures are intact margins well coapted she has good range of motion of the ankle and the toes with no obvious areas of anesthesia.  Assessment: Well-healing surgical foot 1 week.  Plan: Redressed today dressed a compressive dressing.  I told her that she did not you need to use the crutches as much.  But she does need to sleep in the boot.  I will follow-up with her in 1 to 2 weeks.

## 2019-09-14 ENCOUNTER — Ambulatory Visit: Payer: Self-pay

## 2019-09-15 ENCOUNTER — Ambulatory Visit: Payer: BC Managed Care – PPO | Attending: Internal Medicine

## 2019-09-19 ENCOUNTER — Encounter: Payer: BC Managed Care – PPO | Admitting: Podiatry

## 2019-09-20 ENCOUNTER — Other Ambulatory Visit: Payer: Self-pay

## 2019-09-20 ENCOUNTER — Encounter: Payer: Self-pay | Admitting: Podiatry

## 2019-09-20 ENCOUNTER — Ambulatory Visit (INDEPENDENT_AMBULATORY_CARE_PROVIDER_SITE_OTHER): Payer: BC Managed Care – PPO | Admitting: Podiatry

## 2019-09-20 VITALS — Temp 98.4°F

## 2019-09-20 DIAGNOSIS — M722 Plantar fascial fibromatosis: Secondary | ICD-10-CM

## 2019-09-20 DIAGNOSIS — Z9889 Other specified postprocedural states: Secondary | ICD-10-CM

## 2019-09-20 DIAGNOSIS — M7752 Other enthesopathy of left foot: Secondary | ICD-10-CM

## 2019-09-24 ENCOUNTER — Encounter: Payer: Self-pay | Admitting: Podiatry

## 2019-09-24 NOTE — Progress Notes (Signed)
Subjective:  Patient ID: Rachael Brewer, female    DOB: 1960/07/28,  MRN: [id number]  Chief Complaint  Patient presents with  . Routine Post [facility] 03.19.2021 EPF Lt, Sinus Tarsi Exploration Lt, Tenosynovectomy PT Tendon Lt "its doing better"     59 y.o. female returns for post-op check.  Patient states she is doing well.  She denies any other acute complaints.  She has been wearing her boot and staying off of it.  Patient states the sutures are intact but there is some soreness associated with it.  Dressings are clean dry and intact.  She has been wearing her boot while sleeping.  Review of Systems: Negative except as noted in the HPI. Denies N/V/F/Ch.  Past Medical History:  Diagnosis Date  . Anxiety   . Arthritis   . Complication of anesthesia    CAUSES MIGRAINE  . Depression   . Edema of both lower extremities   . GERD (gastroesophageal reflux disease)   . Migraines   . Pre-diabetes   . Sleep apnea    CPAP    Current Outpatient Medications:  .  albuterol (VENTOLIN HFA) 108 (90 Base) MCG/ACT inhaler, Inhale 1-2 puffs into the lungs every 6 (six) hours as needed for wheezing or shortness of breath., Disp: 1 Inhaler, Rfl: 3 .  aspirin-acetaminophen-caffeine (EXCEDRIN MIGRAINE) 250-250-65 MG tablet, Take 2 tablets by mouth every 8 (eight) hours as needed for migraine. , Disp: , Rfl:  .  Azelastine HCl 137 MCG/SPRAY SOLN, Place 1 spray into both nostrils at bedtime. , Disp: , Rfl:  .  baclofen (LIORESAL) 10 MG tablet, Take 10 mg by mouth 2 (two) times daily as needed for muscle spasms. LIMIT 2 DAYS PER WEEK, Disp: , Rfl:  .  bisacodyl (DULCOLAX) 5 MG EC tablet, Take 10 mg by mouth 2 (two) times daily., Disp: , Rfl:  .  buPROPion (WELLBUTRIN) 100 MG tablet, TAKE 1 TABLET BY MOUTH TWICE A DAY (Patient taking differently: Take 100 mg by mouth 2 (two) times daily. Morning & lunch), Disp: 180 tablet, Rfl: 1 .  butalbital-acetaminophen-caffeine (FIORICET) 50-325-40 MG tablet, Take  1-2 tablets by mouth every 6 (six) hours as needed for headache., Disp: 20 tablet, Rfl: 0 .  celecoxib (CELEBREX) 200 MG capsule, Take 1 capsule (200 mg total) by mouth 2 (two) times daily., Disp: 60 capsule, Rfl: 2 .  clindamycin (CLEOCIN) 150 MG capsule, Take 1 capsule (150 mg total) by mouth 3 (three) times daily., Disp: 30 capsule, Rfl: 0 .  escitalopram (LEXAPRO) 20 MG tablet, TAKE 1 TABLET BY MOUTH EVERY DAY (Patient taking differently: Take 20 mg by mouth daily. ), Disp: 30 tablet, Rfl: 3 .  gabapentin (NEURONTIN) 600 MG tablet, Take 600 mg by mouth 2 (two) times daily. , Disp: , Rfl:  .  HYDROcodone-acetaminophen (NORCO/VICODIN) 5-325 MG tablet, Take by mouth., Disp: , Rfl:  .  Multiple Vitamin (MULTIVITAMIN WITH MINERALS) TABS tablet, Take 1 tablet by mouth daily., Disp: , Rfl:  .  omeprazole (PRILOSEC) 40 MG capsule, Take 1 capsule (40 mg total) by mouth daily., Disp: 90 capsule, Rfl: 2 .  ondansetron (ZOFRAN) 4 MG tablet, Take 1 tablet (4 mg total) by mouth every 8 (eight) hours as needed., Disp: 20 tablet, Rfl: 0 .  oxyCODONE (ROXICODONE) 5 MG immediate release tablet, Take 1-2 tablets (5-10 mg total) by mouth every 4 (four) hours as needed for severe pain. (Patient not taking: Reported on 08/31/2019), Disp: 30 tablet, Rfl: 0 .  Potassium Chloride 40 MEQ/15ML (20%) SOLN, Take 40 mEq by mouth daily., Disp: 473 mL, Rfl: 3 .  rizatriptan (MAXALT) 10 MG tablet, Take 10 mg by mouth every 2 (two) hours as needed for migraine (max 2 tablets/24 hrs.). , Disp: , Rfl: 0 .  topiramate ER (QUDEXY XR) 150 MG CS24 sprinkle capsule, Take 150 mg by mouth at bedtime. , Disp: , Rfl:  .  torsemide (DEMADEX) 20 MG tablet, TAKE 2 TABLETS BY MOUTH EVERY DAY (Patient taking differently: Take 40 mg by mouth daily. ), Disp: 60 tablet, Rfl: 3 .  traMADol (ULTRAM) 50 MG tablet, Take 1 tablet (50 mg total) by mouth every 6 (six) hours as needed for moderate pain., Disp: 30 tablet, Rfl: 0 .  vitamin B-12  (CYANOCOBALAMIN) 1000 MCG tablet, Take 500 mcg by mouth daily., Disp: , Rfl:   Social History   Tobacco Use  Smoking Status Former Smoker  . Packs/day: 1.00  . Years: 15.00  . Pack years: 15.00  . Quit date: 06/21/1999  . Years since quitting: 20.2  Smokeless Tobacco Never Used    Allergies  Allergen Reactions  . Bactrim [Sulfamethoxazole-Trimethoprim] Shortness Of Breath and Swelling  . Sulfa Antibiotics Anaphylaxis  . Sumatriptan     Made migraine more intense and scalp felt like it was on fire  . Penicillins Hives    Did it involve swelling of the face/tongue/throat, SOB, or low BP? No Did it involve sudden or severe rash/hives, skin peeling, or any reaction on the inside of your mouth or nose? No Did you need to seek medical attention at a hospital or doctor's office? No When did it last happen?10 years ago If all above answers are "NO", may proceed with cephalosporin use.    Objective:   Vitals:   09/20/19 1546  Temp: 98.4 F (36.9 C)   There is no height or weight on file to calculate BMI. Constitutional Well developed. Well nourished.  Vascular Foot warm and well perfused. Capillary refill normal to all digits.   Neurologic Normal speech. Oriented to person, place, and time. Epicritic sensation to light touch grossly present bilaterally.  Dermatologic Skin healing well without signs of infection. Skin edges well coapted without signs of infection.  Orthopedic: Tenderness to palpation noted about the surgical site.   Radiographs: None Assessment:   1. Plantar fasciitis, left   2. Subtalar joint capsulitis of ankle, left   3. Status post left foot surgery    Plan:  Patient was evaluated and treated and all questions answered.  S/p foot surgery left -Progressing as expected post-operatively. -XR: None -WB Status: Nonweightbearing in left foot in a cam boot -Sutures: Intact without any signs of dehiscence.  Sutures were not ready to come out quite  yet.  We will plan on leaving it until next visit. -Medications: None -Foot redressed.  Return see Dr. Milinda Pointer next appt.

## 2019-10-03 ENCOUNTER — Ambulatory Visit (INDEPENDENT_AMBULATORY_CARE_PROVIDER_SITE_OTHER): Payer: BC Managed Care – PPO | Admitting: Podiatry

## 2019-10-03 ENCOUNTER — Encounter: Payer: Self-pay | Admitting: Podiatry

## 2019-10-03 ENCOUNTER — Other Ambulatory Visit: Payer: Self-pay

## 2019-10-03 DIAGNOSIS — M7752 Other enthesopathy of left foot: Secondary | ICD-10-CM

## 2019-10-03 DIAGNOSIS — Z9889 Other specified postprocedural states: Secondary | ICD-10-CM

## 2019-10-03 DIAGNOSIS — M722 Plantar fascial fibromatosis: Secondary | ICD-10-CM

## 2019-10-03 NOTE — Progress Notes (Signed)
She presents today date of surgery 09/07/2019 status post EPF and sinus tarsectomy with exploration and tenosynovectomy of the posterior tibial tendon.  She states that this feels much better than it did everything is doing just as it should.  She denies calf pain back pain chest pain shortness of breath does state that she has some swelling to the bilateral lower extremities left greater than right.  She states that she has not been sleeping in her cam boot as instructed she states that is just too much under the covers and I cannot sleep in the recliner any longer so I have been taking the boot off and wearing my crocs around the house.  Objective: Vital signs are stable alert and oriented x3 dressed her dressing intact was removed demonstrates no erythema edema cellulitis drainage or odor sutures were not removed last visit which she is a little upset about because she did get to wash until now.  Assessment: Well-healing surgical foot left.  Plan: Sutures were removed today and allow her to wear the crocs around the house for now I try to encourage her to sleep with a cam walker or the night splint for at least a month she will not do that.  I will follow-up with her in 2 weeks.

## 2019-10-11 DIAGNOSIS — Z96652 Presence of left artificial knee joint: Secondary | ICD-10-CM | POA: Diagnosis not present

## 2019-10-11 DIAGNOSIS — M79644 Pain in right finger(s): Secondary | ICD-10-CM | POA: Diagnosis not present

## 2019-10-14 ENCOUNTER — Other Ambulatory Visit: Payer: Self-pay | Admitting: Family Medicine

## 2019-10-14 DIAGNOSIS — R609 Edema, unspecified: Secondary | ICD-10-CM

## 2019-10-14 NOTE — Telephone Encounter (Signed)
Requested Prescriptions  Pending Prescriptions Disp Refills  . escitalopram (LEXAPRO) 20 MG tablet [Pharmacy Med Name: ESCITALOPRAM 20 MG TABLET] 90 tablet 1    Sig: TAKE 1 TABLET BY MOUTH EVERY DAY     Psychiatry:  Antidepressants - SSRI Passed - 10/14/2019 12:58 AM      Passed - Completed PHQ-2 or PHQ-9 in the last 360 days.      Passed - Valid encounter within last 6 months    Recent Outpatient Visits          1 month ago Pre-op examination   [facility] [place], Dionne Bucy, MD   1 year ago Depression, recurrent [facility])   [facility] [place], Dionne Bucy, MD   1 year ago Depression, recurrent [facility])   [facility] [place], Dionne Bucy, MD   1 year ago Fatigue, unspecified type   [facility], Dionne Bucy, MD   2 years ago Depression, recurrent [facility])   [facility], Dionne Bucy, MD             . torsemide (DEMADEX) 20 MG tablet [Pharmacy Med Name: TORSEMIDE 20 MG TABLET] 60 tablet 3    Sig: TAKE 2 TABLETS BY MOUTH EVERY DAY     Cardiovascular:  Diuretics - Loop Failed - 10/14/2019 12:58 AM      Failed - K in normal range and within 360 days    Potassium  Date Value Ref Range Status  07/06/2019 3.1 (L) 3.5 - 5.1 mmol/L Final         Failed - Last BP in normal range    BP Readings from Last 1 Encounters:  08/31/19 140/70         Passed - Ca in normal range and within 360 days    Calcium  Date Value Ref Range Status  07/06/2019 9.3 8.9 - 10.3 mg/dL Final         Passed - Na in normal range and within 360 days    Sodium  Date Value Ref Range Status  07/06/2019 142 135 - 145 mmol/L Final  02/09/2018 142 134 - 144 mmol/L Final         Passed - Cr in normal range and within 360 days    Creat  Date Value Ref Range Status  05/13/2017 0.92 0.50 - 1.05 mg/dL Final    Comment:    For patients >45 years of age, the reference limit for Creatinine is approximately 13% higher for  people identified as African-American. .    Creatinine, Ser  Date Value Ref Range Status  07/06/2019 0.93 0.44 - 1.00 mg/dL Final         Passed - Valid encounter within last 6 months    Recent Outpatient Visits          1 month ago Pre-op examination   [facility] [place], Dionne Bucy, MD   1 year ago Depression, recurrent [facility])   [facility] [place], Dionne Bucy, MD   1 year ago Depression, recurrent [facility])   [facility] [place], Dionne Bucy, MD   1 year ago Fatigue, unspecified type   [facility], Dionne Bucy, MD   2 years ago Depression, recurrent [facility])   [facility] [place], MD             Patient is on potassium supplementation which was started in January 2021.

## 2019-10-17 ENCOUNTER — Ambulatory Visit: Payer: BC Managed Care – PPO

## 2019-10-17 ENCOUNTER — Other Ambulatory Visit: Payer: Self-pay

## 2019-10-17 ENCOUNTER — Encounter: Payer: Self-pay | Admitting: Podiatry

## 2019-10-17 ENCOUNTER — Ambulatory Visit (INDEPENDENT_AMBULATORY_CARE_PROVIDER_SITE_OTHER): Payer: BC Managed Care – PPO | Admitting: Podiatry

## 2019-10-17 DIAGNOSIS — M722 Plantar fascial fibromatosis: Secondary | ICD-10-CM

## 2019-10-17 DIAGNOSIS — Z9889 Other specified postprocedural states: Secondary | ICD-10-CM

## 2019-10-17 DIAGNOSIS — M7752 Other enthesopathy of left foot: Secondary | ICD-10-CM

## 2019-10-17 NOTE — Progress Notes (Signed)
She presents today date of surgery 09/07/2019 status post endoscopic plantar fasciotomy sinus tarsectomy tenosynovectomy of the posterior tibial tendon and she states that she is doing really well she says it feels some pulling in there sometimes and it swells a bit but I am very happy with my knees and my foot.  She does make a comment today that she would like to have the margins of her great toenails removed because they still tend and grow and hurt.  Objective: Vital signs are stable she is alert and oriented x3 mild edema about the left foot incision sites have gone on to heal with the exception of the 1 over the sinus tarsi which is just an epidermal dehiscence we did place a small amount of Neosporin over that today instructed her to continue to do so.  Her toenails do demonstrate sharp incurvation mild erythema and tenderness on palpation particularly the hallux nails medial and lateral borders.  Assessment: Well-healing surgical foot.  Ingrown toenails hallux bilateral.  Plan: Discussed etiology pathology conservative therapies at this point in time would like to go ahead and schedule her back to see me in 35month I recommended she continue the Neosporin preparation to the incision site overlying the sinus tarsi and cover during the daytime with a Band-Aid.  Continue her activities low impact and follow-up with me in 1 month.  At that 1 month follow-up we will performed chemical matricectomy to the tibiofibular border of the hallux bilateral MRI and explained to her that she will have to keep these soaked once or twice a day until I see her again 2 weeks later.

## 2019-11-07 ENCOUNTER — Encounter: Payer: Self-pay | Admitting: Podiatry

## 2019-11-07 ENCOUNTER — Ambulatory Visit (INDEPENDENT_AMBULATORY_CARE_PROVIDER_SITE_OTHER): Payer: BC Managed Care – PPO | Admitting: Podiatry

## 2019-11-07 ENCOUNTER — Other Ambulatory Visit: Payer: Self-pay

## 2019-11-07 DIAGNOSIS — Z9889 Other specified postprocedural states: Secondary | ICD-10-CM

## 2019-11-07 DIAGNOSIS — L600 Ingrowing nail: Secondary | ICD-10-CM

## 2019-11-07 DIAGNOSIS — M7752 Other enthesopathy of left foot: Secondary | ICD-10-CM

## 2019-11-07 DIAGNOSIS — M722 Plantar fascial fibromatosis: Secondary | ICD-10-CM

## 2019-11-07 MED ORDER — NEOMYCIN-POLYMYXIN-HC 1 % OT SOLN: OTIC | 1 refills | Status: DC

## 2019-11-07 NOTE — Patient Instructions (Signed)

## 2019-11-07 NOTE — Progress Notes (Signed)
Rachael Brewer presents today date of surgery September 07, 2019 endoscopic plantar fasciotomy left foot sinus tarsitis and excision of soft tissue mass left foot and exploration of a posterior tibial tendon and a tenosynovectomy left foot.  Rachael Brewer states that the left ankle is a little bit sore and sore on the top of the foot but the ingrown's are most painful with painful borders bilaterally.  Rachael Brewer states that having the these things removed for many years and just have never done it.  Objective: Vital signs are stable Rachael Brewer is alert and oriented x3.  Left lower extremity is little more swollen than that of the right.  Rachael Brewer has some scar tissue overlying the anterior lateral incision over the sinus tarsi.  Otherwise it appears that everything is healing nicely.  Rachael Brewer has no calf pain that is resulting in the swelling.  Hallux nails were sharply incurvated and painful on palpation.  There is no erythema cellulitis drainage or odor.  They are exquisitely painful.  Assessment: Ingrown toenails hallux bilaterally well-healing surgical foot left mild edema today.  Plan: Discussed etiology pathology conservative versus surgical therapies.  At this point chemical matrixectomy's were performed after local anesthetic was administered to each great toe.  Rachael Brewer tolerated procedure well received both oral and written home-going structure for care and soaking of the toe as well as a prescription for Cortisporin Otic.  I will follow-up with her in 2 weeks.  Should Rachael Brewer have questions or concerns Rachael Brewer will notify us immediately.  I instructed her to wear compression socks on the left foot and leg to help with the edema.

## 2019-11-27 DIAGNOSIS — G43839 Menstrual migraine, intractable, without status migrainosus: Secondary | ICD-10-CM | POA: Diagnosis not present

## 2019-11-27 DIAGNOSIS — G43019 Migraine without aura, intractable, without status migrainosus: Secondary | ICD-10-CM | POA: Diagnosis not present

## 2019-11-27 DIAGNOSIS — G43719 Chronic migraine without aura, intractable, without status migrainosus: Secondary | ICD-10-CM | POA: Diagnosis not present

## 2019-11-28 ENCOUNTER — Ambulatory Visit: Payer: BC Managed Care – PPO | Admitting: Podiatry

## 2019-12-11 DIAGNOSIS — Z96653 Presence of artificial knee joint, bilateral: Secondary | ICD-10-CM | POA: Diagnosis not present

## 2019-12-12 ENCOUNTER — Other Ambulatory Visit: Payer: Self-pay

## 2019-12-12 ENCOUNTER — Ambulatory Visit (INDEPENDENT_AMBULATORY_CARE_PROVIDER_SITE_OTHER): Payer: BC Managed Care – PPO | Admitting: Podiatry

## 2019-12-12 ENCOUNTER — Encounter: Payer: Self-pay | Admitting: Family Medicine

## 2019-12-12 ENCOUNTER — Ambulatory Visit (INDEPENDENT_AMBULATORY_CARE_PROVIDER_SITE_OTHER): Payer: BC Managed Care – PPO | Admitting: Family Medicine

## 2019-12-12 ENCOUNTER — Other Ambulatory Visit (HOSPITAL_COMMUNITY)
Admission: RE | Admit: 2019-12-12 | Discharge: 2019-12-12 | Disposition: A | Discharge status: Home / Self Care | Payer: BC Managed Care – PPO | Source: Ambulatory Visit | Attending: Family Medicine | Admitting: Family Medicine

## 2019-12-12 ENCOUNTER — Encounter: Payer: Self-pay | Admitting: Podiatry

## 2019-12-12 VITALS — BP 110/64 | HR 77 | Temp 96.9°F | Resp 16 | Ht 63.0 in | Wt 257.0 lb

## 2019-12-12 DIAGNOSIS — R87610 Atypical squamous cells of undetermined significance on cytologic smear of cervix (ASC-US): Secondary | ICD-10-CM | POA: Insufficient documentation

## 2019-12-12 DIAGNOSIS — Z1231 Encounter for screening mammogram for malignant neoplasm of breast: Secondary | ICD-10-CM

## 2019-12-12 DIAGNOSIS — I10 Essential (primary) hypertension: Secondary | ICD-10-CM

## 2019-12-12 DIAGNOSIS — E7800 Pure hypercholesterolemia, unspecified: Secondary | ICD-10-CM

## 2019-12-12 DIAGNOSIS — N898 Other specified noninflammatory disorders of vagina: Secondary | ICD-10-CM | POA: Insufficient documentation

## 2019-12-12 DIAGNOSIS — Z0000 Encounter for general adult medical examination without abnormal findings: Secondary | ICD-10-CM

## 2019-12-12 DIAGNOSIS — E876 Hypokalemia: Secondary | ICD-10-CM

## 2019-12-12 DIAGNOSIS — R609 Edema, unspecified: Secondary | ICD-10-CM

## 2019-12-12 DIAGNOSIS — Z124 Encounter for screening for malignant neoplasm of cervix: Secondary | ICD-10-CM | POA: Diagnosis not present

## 2019-12-12 DIAGNOSIS — R7303 Prediabetes: Secondary | ICD-10-CM | POA: Insufficient documentation

## 2019-12-12 DIAGNOSIS — L600 Ingrowing nail: Secondary | ICD-10-CM

## 2019-12-12 DIAGNOSIS — I890 Lymphedema, not elsewhere classified: Secondary | ICD-10-CM

## 2019-12-12 DIAGNOSIS — F339 Major depressive disorder, recurrent, unspecified: Secondary | ICD-10-CM

## 2019-12-12 DIAGNOSIS — K219 Gastro-esophageal reflux disease without esophagitis: Secondary | ICD-10-CM

## 2019-12-12 DIAGNOSIS — M7752 Other enthesopathy of left foot: Secondary | ICD-10-CM

## 2019-12-12 DIAGNOSIS — Z114 Encounter for screening for human immunodeficiency virus [HIV]: Secondary | ICD-10-CM

## 2019-12-12 DIAGNOSIS — Z9889 Other specified postprocedural states: Secondary | ICD-10-CM

## 2019-12-12 MED ORDER — TORSEMIDE 20 MG PO TABS: 40.0000 mg | ORAL_TABLET | Freq: Every day | ORAL | 1 refills | Status: DC

## 2019-12-12 MED ORDER — ESCITALOPRAM OXALATE 20 MG PO TABS: 20.0000 mg | ORAL_TABLET | Freq: Every day | ORAL | 3 refills | Status: DC

## 2019-12-12 MED ORDER — OMEPRAZOLE 40 MG PO CPDR: 40.0000 mg | DELAYED_RELEASE_CAPSULE | Freq: Every day | ORAL | 2 refills | Status: DC

## 2019-12-12 MED ORDER — BUPROPION HCL 100 MG PO TABS: 100.0000 mg | ORAL_TABLET | Freq: Two times a day (BID) | ORAL | 1 refills | Status: DC

## 2019-12-12 MED ORDER — POTASSIUM CHLORIDE 40 MEQ/15ML (20%) PO SOLN: 40.0000 meq | Freq: Every day | ORAL | 3 refills | Status: DC

## 2019-12-12 NOTE — Progress Notes (Signed)
She presents today for follow-up of her matrixectomy hallux bilaterally and she states that it seems to be healing okay has no problems at this point still little bit tender on the one side where the has some thick scar tissue.  States that her foot is still little bit tender left but seems to be improving.  Objective: Vital signs are stable alert oriented x3.  There is no erythema no cellulitis drainage or odor.  Reactive hyperkeratotic l tissues around the nail.  I debrided that for her today.  Assessment: Well-healing surgical foot.  Plan: Follow-up with me on an as-needed basis.

## 2019-12-12 NOTE — Assessment & Plan Note (Signed)
New problem No associated symptoms Possibly related to physiologic discharge, but will test for BV and yeast Patient declines STD testing Treatment pending results

## 2019-12-12 NOTE — Assessment & Plan Note (Signed)
Well controlled Continue current medications Recheck metabolic panel F/u in 6 months  

## 2019-12-12 NOTE — Assessment & Plan Note (Signed)
Encourage low carb diet Recheck A1c 

## 2019-12-12 NOTE — Progress Notes (Signed)
Complete physical exam   Patient: Rachael Brewer   DOB: [birth year]   59 y.o. Female  MRN: [id number] Visit Date: 12/12/2019  Today's healthcare provider: Lavon Paganini, MD  Cameron Ali as a scribe for Lavon Paganini, MD.,have documented all relevant documentation on the behalf of Lavon Paganini, MD,as directed by  Lavon Paganini, MD while in the presence of Lavon Paganini, MD. Chief Complaint  Patient presents with  . Annual Exam   Subjective    Rachael Brewer is a 59 y.o. female who presents today for a complete physical exam.  She reports consuming a general diet. Home exercise routine includes walking. She generally feels well. She reports sleeping fairly well. She doest have additional problems to discuss today Patient reports for the past week and half vaginal discharge that she describes as a clear jelly like discharge. Patient denied symptoms for vaginal itching or pain. Patient reports that she has been doing well since bilateral knee surgery and left ankle surgery 09/07/19.  HPI    Past Medical History:  Diagnosis Date  . Anxiety   . Arthritis   . Complication of anesthesia    CAUSES MIGRAINE  . Depression   . Edema of both lower extremities   . GERD (gastroesophageal reflux disease)   . Migraines   . Pre-diabetes   . Sleep apnea    CPAP   Past Surgical History:  Procedure Laterality Date  . ANTERIOR CRUCIATE LIGAMENT REPAIR Right 1996  . DILATION AND CURETTAGE OF UTERUS  2004  . FOOT SURGERY Bilateral   . JOINT REPLACEMENT Right   . KNEE ARTHROPLASTY Right 05/02/2019   Procedure: COMPUTER ASSISTED TOTAL KNEE ARTHROPLASTY;  Surgeon: Dereck Leep, MD;  Location: [facility] ORS;  Service: Orthopedics;  Laterality: Right;  . KNEE ARTHROPLASTY Left 07/09/2019   Procedure: COMPUTER ASSISTED TOTAL KNEE ARTHROPLASTY;  Surgeon: Dereck Leep, MD;  Location: [facility] OR[facility];  Service: Orthopedics;  Laterality: Left;  . KNEE SURGERY Right     . MENISCUS REPAIR Bilateral    Social History   Socioeconomic History  . Marital status: Married    Spouse name: Not on file  . Number of children: Not on file  . Years of education: Not on file  . Highest education level: Not on file  Occupational History  . Not on file  Tobacco Use  . Smoking status: Former Smoker    Packs/day: 1.00    Years: 15.00    Pack years: 15.00    Quit date: 06/21/1999    Years since quitting: 20.4  . Smokeless tobacco: Never Used  Vaping Use  . Vaping Use: Never used  Substance and Sexual Activity  . Alcohol use: Yes    Alcohol/week: 0.0 standard drinks    Comment: occasional  . Drug use: No  . Sexual activity: Yes    Birth control/protection: None  Other Topics Concern  . Not on file  Social History Narrative  . Not on file   Social Determinants of Health   Financial Resource Strain:   . Difficulty of Paying Living Expenses:   Food Insecurity:   . Worried About Charity fundraiser in the Last Year:   . Arboriculturist in the Last Year:   Transportation Needs:   . Film/video editor (Medical):   Marland Kitchen Lack of Transportation (Non-Medical):   Physical Activity:   . Days of Exercise per Week:   . Minutes of Exercise per Session:   Stress:   .  Feeling of Stress :   Social Connections:   . Frequency of Communication with Friends and Family:   . Frequency of Social Gatherings with Friends and Family:   . Attends Religious Services:   . Active Member of Clubs or Organizations:   . Attends Archivist Meetings:   Marland Kitchen Marital Status:   Intimate Partner Violence:   . Fear of Current or Ex-Partner:   . Emotionally Abused:   Marland Kitchen Physically Abused:   . Sexually Abused:    Family Status  Relation Name Status  . Mother  Alive  . Father  Deceased  . Brother  Alive  . Other  (Not Specified)  . Mat Aunt  (Not Specified)   Family History  Problem Relation Age of Onset  . Hypertension Brother   . Diabetes Brother   . Diabetes  Other   . Hypertension Other   . Glaucoma Other   . Breast cancer Maternal Aunt        50's   Allergies  Allergen Reactions  . Bactrim [Sulfamethoxazole-Trimethoprim] Shortness Of Breath and Swelling  . Sulfa Antibiotics Anaphylaxis  . Sumatriptan     Made migraine more intense and scalp felt like it was on fire  . Penicillins Hives    Did it involve swelling of the face/tongue/throat, SOB, or low BP? No Did it involve sudden or severe rash/hives, skin peeling, or any reaction on the inside of your mouth or nose? No Did you need to seek medical attention at a hospital or doctor's office? No When did it last happen?10 years ago If all above answers are "NO", may proceed with cephalosporin use.     Patient Care Team: Virginia Crews, MD as PCP - General (Family Medicine)   Medications: Outpatient Medications Prior to Visit  Medication Sig  . albuterol (VENTOLIN HFA) 108 (90 Base) MCG/ACT inhaler Inhale 1-2 puffs into the lungs every 6 (six) hours as needed for wheezing or shortness of breath.  Marland Kitchen aspirin-acetaminophen-caffeine (EXCEDRIN MIGRAINE) 250-250-65 MG tablet Take 2 tablets by mouth every 8 (eight) hours as needed for migraine.   . Azelastine HCl 137 MCG/SPRAY SOLN Place 1 spray into both nostrils at bedtime.   . baclofen (LIORESAL) 10 MG tablet Take 10 mg by mouth 2 (two) times daily as needed for muscle spasms. LIMIT 2 DAYS PER WEEK  . butalbital-acetaminophen-caffeine (FIORICET) 50-325-40 MG tablet Take 1-2 tablets by mouth every 6 (six) hours as needed for headache.  . celecoxib (CELEBREX) 200 MG capsule Take 1 capsule (200 mg total) by mouth 2 (two) times daily.  Marland Kitchen gabapentin (NEURONTIN) 600 MG tablet Take 600 mg by mouth 2 (two) times daily.   . Multiple Vitamin (MULTIVITAMIN WITH MINERALS) TABS tablet Take 1 tablet by mouth daily.  . rizatriptan (MAXALT) 10 MG tablet Take 10 mg by mouth every 2 (two) hours as needed for migraine (max 2 tablets/24 hrs.).   Marland Kitchen  topiramate ER (QUDEXY XR) 150 MG CS24 sprinkle capsule Take 150 mg by mouth at bedtime.   . [DISCONTINUED] buPROPion (WELLBUTRIN) 100 MG tablet TAKE 1 TABLET BY MOUTH TWICE A DAY (Patient taking differently: Take 100 mg by mouth 2 (two) times daily. Morning & lunch)  . [DISCONTINUED] escitalopram (LEXAPRO) 20 MG tablet TAKE 1 TABLET BY MOUTH EVERY DAY  . [DISCONTINUED] omeprazole (PRILOSEC) 40 MG capsule Take 1 capsule (40 mg total) by mouth daily.  . [DISCONTINUED] Potassium Chloride 40 MEQ/15ML (20%) SOLN Take 40 mEq by mouth daily.  . [DISCONTINUED]  torsemide (DEMADEX) 20 MG tablet TAKE 2 TABLETS BY MOUTH EVERY DAY  . aspirin 81 MG EC tablet Take by mouth. (Patient not taking: Reported on 12/12/2019)  . bisacodyl (DULCOLAX) 5 MG EC tablet Take 10 mg by mouth 2 (two) times daily. (Patient not taking: Reported on 12/12/2019)  . [DISCONTINUED] clindamycin (CLEOCIN) 300 MG capsule Take 300 mg by mouth 2 (two) times daily. (Patient not taking: Reported on 12/12/2019)  . [DISCONTINUED] NEOMYCIN-POLYMYXIN-HYDROCORTISONE (CORTISPORIN) 1 % SOLN OTIC solution Apply 1-2 drops to toe BID after soaking (Patient not taking: Reported on 12/12/2019)  . [DISCONTINUED] traMADol (ULTRAM) 50 MG tablet Take 1 tablet (50 mg total) by mouth every 6 (six) hours as needed for moderate pain. (Patient not taking: Reported on 12/12/2019)  . [DISCONTINUED] vitamin B-12 (CYANOCOBALAMIN) 1000 MCG tablet Take 500 mcg by mouth daily. (Patient not taking: Reported on 12/12/2019)   No facility-administered medications prior to visit.    Review of Systems  Constitutional: Positive for appetite change and fatigue.  HENT: Positive for hearing loss and nosebleeds.   Respiratory: Positive for apnea, shortness of breath and wheezing.   Genitourinary: Positive for vaginal discharge.  Musculoskeletal: Positive for back pain and neck pain.  Neurological: Positive for headaches.      Objective    BP 110/64   Pulse 77   Temp (!) 96.9  F (36.1 C) (Oral)   Resp 16   Ht 5\' 3"  (1.6 m)   Wt 257 lb (116.6 kg)   LMP  (LMP Unknown)   SpO2 94%   BMI 45.53 kg/m    Physical Exam Vitals reviewed.  Constitutional:      General: She is not in acute distress.    Appearance: Normal appearance. She is well-developed. She is not diaphoretic.  HENT:     Head: Normocephalic and atraumatic.     Right Ear: Tympanic membrane, ear canal and external ear normal.     Left Ear: Tympanic membrane, ear canal and external ear normal.  Eyes:     General: No scleral icterus.    Conjunctiva/sclera: Conjunctivae normal.     Pupils: Pupils are equal, round, and reactive to light.  Neck:     Thyroid: No thyromegaly.  Cardiovascular:     Rate and Rhythm: Normal rate and regular rhythm.     Pulses: Normal pulses.     Heart sounds: Normal heart sounds. No murmur heard.   Pulmonary:     Effort: Pulmonary effort is normal. No respiratory distress.     Breath sounds: Normal breath sounds. No wheezing or rales.  Chest:     Comments: Breasts: breasts appear normal, no suspicious masses, no skin or nipple changes or axillary nodes.  Abdominal:     General: There is no distension.     Palpations: Abdomen is soft.     Tenderness: There is no abdominal tenderness.  Genitourinary:    Comments: GYN:  External genitalia within normal limits.  Vaginal mucosa pink, moist, normal rugae.  Nonfriable cervix without lesions, minimal thin discharge, no bleeding noted on speculum exam.  Bimanual exam revealed normal, nongravid uterus.  No cervical motion tenderness. No adnexal masses bilaterally.    Musculoskeletal:        General: No deformity.     Cervical back: Neck supple.     Right lower leg: No edema.     Left lower leg: No edema.  Lymphadenopathy:     Cervical: No cervical adenopathy.  Skin:    General: Skin is  warm and dry.     Findings: No rash.  Neurological:     Mental Status: She is alert and oriented to person, place, and time. Mental  status is at baseline.     Sensory: No sensory deficit.     Motor: No weakness.     Gait: Gait normal.  Psychiatric:        Mood and Affect: Mood normal.        Behavior: Behavior normal.        Thought Content: Thought content normal.      Last depression screening scores PHQ 2/9 Scores 12/12/2019 11/07/2018 05/29/2018  PHQ - 2 Score 4 2 4   PHQ- 9 Score 14 16 14    Last fall risk screening Fall Risk  12/12/2019  Falls in the past year? 0  Number falls in past yr: 0  Injury with Fall? 0   Last Audit-C alcohol use screening Alcohol Use Disorder Test (AUDIT) 12/12/2019  1. How often do you have a drink containing alcohol? 2  2. How many drinks containing alcohol do you have on a typical day when you are drinking? 0  3. How often do you have six or more drinks on one occasion? 0  AUDIT-C Score 2   A score of 3 or more in women, and 4 or more in men indicates increased risk for alcohol abuse, EXCEPT if all of the points are from question 1   No results found for any visits on 12/12/19.  Assessment & Plan    Routine Health Maintenance and Physical Exam  Exercise Activities and Dietary recommendations Goals    . Exercise 150 minutes per week (moderate activity)       Immunization History  Administered Date(s) Administered  . Influenza Inj Mdck Quad Pf 03/13/2018  . Influenza,inj,Quad PF,6+ Mos 04/02/2017, 02/09/2018, 05/03/2019  . Influenza,inj,quad, With Preservative 04/02/2017  . Influenza-Unspecified 05/17/2016  . Td 07/22/2000  . Tdap 05/11/2017  . Zoster Recombinat (Shingrix) 07/17/2019    Health Maintenance  Topic Date Due  . COVID-19 Vaccine (1) Never done  . HIV Screening  Never done  . MAMMOGRAM  11/05/2019  . INFLUENZA VACCINE  01/20/2020  . PAP SMEAR-Modifier  12/14/2020  . COLONOSCOPY  10/10/2021  . TETANUS/TDAP  05/12/2027  . Hepatitis C Screening  Completed    Discussed health benefits of physical activity, and encouraged her to engage in regular  exercise appropriate for her age and condition.  Problem List Items Addressed This Visit      Cardiovascular and Mediastinum   BP (high blood pressure)    Well controlled Continue current medications Recheck metabolic panel F/u in 6 months      Relevant Medications   torsemide (DEMADEX) 20 MG tablet   Other Relevant Orders   Comprehensive metabolic panel     Other   Depression, recurrent (HCC)    Chronic and stable Continue Lexapro and Wellbutrin Encouraged to take her second dose of Wellbutrin daily At follow-up, repeat PHQ-9 Have discussed the benefits of therapy in conjunction with medication      Relevant Medications   buPROPion (WELLBUTRIN) 100 MG tablet   escitalopram (LEXAPRO) 20 MG tablet   Hypercholesteremia    Reviewed last lipid panel Not currently on a statin Recheck FLP and CMP Discussed diet and exercise      Relevant Medications   torsemide (DEMADEX) 20 MG tablet   Other Relevant Orders   Lipid panel   Morbid obesity ([place])    Discussed importance  of healthy weight management Discussed diet and exercise Patient continues to gain weight despite Wellbutrin and Topamax that are being used for other problems, but have side effects of weight loss and appetite control Referral to medical weight management clinic      Relevant Orders   Amb Ref to Medical Weight Management   Hemoglobin A1c   Lymphedema    Previously followed by vascular surgery Continue compression stockings and leg elevation Continue torsemide      Vaginal discharge    New problem No associated symptoms Possibly related to physiologic discharge, but will test for BV and yeast Patient declines STD testing Treatment pending results      Relevant Orders   Cervicovaginal ancillary only   Pre-diabetes    Encourage low-carb diet Recheck A1c      Relevant Orders   Amb Ref to Medical Weight Management   Hemoglobin A1c   RESOLVED: Hypokalemia   Relevant Orders   Comprehensive  metabolic panel    Other Visit Diagnoses    Annual physical exam    -  Primary   Relevant Orders   Comprehensive metabolic panel   Lipid panel   MM DIGITAL SCREENING BILATERAL   Cytology - PAP   Cervicovaginal ancillary only   Hemoglobin A1c   Encounter for screening mammogram for malignant neoplasm of breast       Relevant Orders   MM DIGITAL SCREENING BILATERAL   Screening for cervical cancer       Relevant Orders   Cytology - PAP   Edema, unspecified type       Relevant Medications   torsemide (DEMADEX) 20 MG tablet   Gastroesophageal reflux disease       Relevant Medications   omeprazole (PRILOSEC) 40 MG capsule   Screening for HIV (human immunodeficiency virus)       Relevant Orders   HIV antibody (with reflex)       Return in 6 months (on 06/12/2020) for chronic disease f/u.     I, Lavon Paganini, MD, have reviewed all documentation for this visit. The documentation on 12/12/19 for the exam, diagnosis, procedures, and orders are all accurate and complete.   Khristie Sak, Dionne Bucy, MD, MPH [facility]

## 2019-12-12 NOTE — Assessment & Plan Note (Signed)
Discussed importance of healthy weight management Discussed diet and exercise Patient continues to gain weight despite Wellbutrin and Topamax that are being used for other problems, but have side effects of weight loss and appetite control Referral to medical weight management clinic

## 2019-12-12 NOTE — Assessment & Plan Note (Signed)
Chronic and stable Continue Lexapro and Wellbutrin Encouraged to take her second dose of Wellbutrin daily At follow-up, repeat PHQ-9 Have discussed the benefits of therapy in conjunction with medication

## 2019-12-12 NOTE — Assessment & Plan Note (Signed)
Reviewed last lipid panel Not currently on a statin Recheck FLP and CMP Discussed diet and exercise  

## 2019-12-12 NOTE — Assessment & Plan Note (Signed)
Previously followed by vascular surgery Continue compression stockings and leg elevation Continue torsemide

## 2019-12-12 NOTE — Patient Instructions (Addendum)
Health Maintenance, Female Adopting a healthy lifestyle and getting preventive care are important in promoting health and wellness. Ask your health care provider about:  The right schedule for you to have regular tests and exams.  Things you can do on your own to prevent diseases and keep yourself healthy. What should I know about diet, weight, and exercise? Eat a healthy diet   Eat a diet that includes plenty of vegetables, fruits, low-fat dairy products, and lean protein.  Do not eat a lot of foods that are high in solid fats, added sugars, or sodium. Maintain a healthy weight Body mass index (BMI) is used to identify weight problems. It estimates body fat based on height and weight. Your health care provider can help determine your BMI and help you achieve or maintain a healthy weight. Get regular exercise Get regular exercise. This is one of the most important things you can do for your health. Most adults should:  Exercise for at least 150 minutes each week. The exercise should increase your heart rate and make you sweat (moderate-intensity exercise).  Do strengthening exercises at least twice a week. This is in addition to the moderate-intensity exercise.  Spend less time sitting. Even light physical activity can be beneficial. Watch cholesterol and blood lipids Have your blood tested for lipids and cholesterol at 60 years of age, then have this test every 5 years. Have your cholesterol levels checked more often if:  Your lipid or cholesterol levels are high.  You are older than 60 years of age.  You are at high risk for heart disease. What should I know about cancer screening? Depending on your health history and family history, you may need to have cancer screening at various ages. This may include screening for:  Breast cancer.  Cervical cancer.  Colorectal cancer.  Skin cancer.  Lung cancer. What should I know about heart disease, diabetes, and high blood  pressure? Blood pressure and heart disease  High blood pressure causes heart disease and increases the risk of stroke. This is more likely to develop in people who have high blood pressure readings, are of African descent, or are overweight.  Have your blood pressure checked: ? Every 3-5 years if you are 18-39 years of age. ? Every year if you are 40 years old or older. Diabetes Have regular diabetes screenings. This checks your fasting blood sugar level. Have the screening done:  Once every three years after age 40 if you are at a normal weight and have a low risk for diabetes.  More often and at a younger age if you are overweight or have a high risk for diabetes. What should I know about preventing infection? Hepatitis B If you have a higher risk for hepatitis B, you should be screened for this virus. Talk with your health care provider to find out if you are at risk for hepatitis B infection. Hepatitis C Testing is recommended for:  Everyone born from 1945 through 1965.  Anyone with known risk factors for hepatitis C. Sexually transmitted infections (STIs)  Get screened for STIs, including gonorrhea and chlamydia, if: ? You are sexually active and are younger than 60 years of age. ? You are older than 60 years of age and your health care provider tells you that you are at risk for this type of infection. ? Your sexual activity has changed since you were last screened, and you are at increased risk for chlamydia or gonorrhea. Ask your health care provider if   you are at risk.  Ask your health care provider about whether you are at high risk for HIV. Your health care provider may recommend a prescription medicine to help prevent HIV infection. If you choose to take medicine to prevent HIV, you should first get tested for HIV. You should then be tested every 3 months for as long as you are taking the medicine. Pregnancy  If you are about to stop having your period (premenopausal) and  you may become pregnant, seek counseling before you get pregnant.  Take 400 to 800 micrograms (mcg) of folic acid every day if you become pregnant.  Ask for birth control (contraception) if you want to prevent pregnancy. Osteoporosis and menopause Osteoporosis is a disease in which the bones lose minerals and strength with aging. This can result in bone fractures. If you are 65 years old or older, or if you are at risk for osteoporosis and fractures, ask your health care provider if you should:  Be screened for bone loss.  Take a calcium or vitamin D supplement to lower your risk of fractures.  Be given hormone replacement therapy (HRT) to treat symptoms of menopause. Follow these instructions at home: Lifestyle  Do not use any products that contain nicotine or tobacco, such as cigarettes, e-cigarettes, and chewing tobacco. If you need help quitting, ask your health care provider.  Do not use street drugs.  Do not share needles.  Ask your health care provider for help if you need support or information about quitting drugs. Alcohol use  Do not drink alcohol if: ? Your health care provider tells you not to drink. ? You are pregnant, may be pregnant, or are planning to become pregnant.  If you drink alcohol: ? Limit how much you use to 0-1 drink a day. ? Limit intake if you are breastfeeding.  Be aware of how much alcohol is in your drink. In the [place] one drink equals one 12 oz bottle of beer (355 mL), one 5 oz glass of wine (148 mL), or one 1 oz glass of hard liquor (44 mL). General instructions  Schedule regular health, dental, and eye exams.  Stay current with your vaccines.  Tell your health care provider if: ? You often feel depressed. ? You have ever been abused or do not feel safe at home. Summary  Adopting a healthy lifestyle and getting preventive care are important in promoting health and wellness.  Follow your health care provider's instructions about healthy  diet, exercising, and getting tested or screened for diseases.  Follow your health care provider's instructions on monitoring your cholesterol and blood pressure. This information is not intended to replace advice given to you by your health care provider. Make sure you discuss any questions you have with your health care provider. Document Revised: 05/31/2018 Document Reviewed: 05/31/2018 Elsevier Patient Education  2020 [employer]   Breast Self-Awareness Breast self-awareness means being familiar with how your breasts look and feel. It involves checking your breasts regularly and reporting any changes to your health care provider. Practicing breast self-awareness is important. Sometimes changes may not be harmful (are benign), but sometimes a change in your breasts can be a sign of a serious medical problem. It is important to learn how to do this procedure correctly so that you can catch problems early, when treatment is more likely to be successful. All women should practice breast self-awareness, including women who have had breast implants. What you need:  A mirror.  A well-lit room.   How to do a breast self-exam A breast self-exam is one way to learn what is normal for your breasts and whether your breasts are changing. To do a breast self-exam: Look for changes  1. Remove all the clothing above your waist. 2. Stand in front of a mirror in a room with good lighting. 3. Put your hands on your hips. 4. Push your hands firmly downward. 5. Compare your breasts in the mirror. Look for differences between them (asymmetry), such as: ? Differences in shape. ? Differences in size. ? Puckers, dips, and bumps in one breast and not the other. 6. Look at each breast for changes in the skin, such as: ? Redness. ? Scaly areas. 7. Look for changes in your nipples, such as: ? Discharge. ? Bleeding. ? Dimpling. ? Redness. ? A change in position. Feel for changes Carefully feel your  breasts for lumps and changes. It is best to do this while lying on your back on the floor, and again while sitting or standing in the tub or shower with soapy water on your skin. Feel each breast in the following way: 1. Place the arm on the side of the breast you are examining above your head. 2. Feel your breast with the other hand. 3. Start in the nipple area and make -inch (2 cm) overlapping circles to feel your breast. Use the pads of your three middle fingers to do this. Apply light pressure, then medium pressure, then firm pressure. The light pressure will allow you to feel the tissue closest to the skin. The medium pressure will allow you to feel the tissue that is a little deeper. The firm pressure will allow you to feel the tissue close to the ribs. 4. Continue the overlapping circles, moving downward over the breast until you feel your ribs below your breast. 5. Move one finger-width toward the center of the body. Continue to use the -inch (2 cm) overlapping circles to feel your breast as you move slowly up toward your collarbone. 6. Continue the up-and-down exam using all three pressures until you reach your armpit.  Write down what you find Writing down what you find can help you remember what to discuss with your health care provider. Write down:  What is normal for each breast.  Any changes that you find in each breast, including: ? The kind of changes you find. ? Any pain or tenderness. ? Size and location of any lumps.  Where you are in your menstrual cycle, if you are still menstruating. General tips and recommendations  Examine your breasts every month.  If you are breastfeeding, the best time to examine your breasts is after a feeding or after using a breast pump.  If you menstruate, the best time to examine your breasts is 5-7 days after your period. Breasts are generally lumpier during menstrual periods, and it may be more difficult to notice changes.  With time  and practice, you will become more familiar with the variations in your breasts and more comfortable with the exam. Contact a health care provider if you:  See a change in the shape or size of your breasts or nipples.  See a change in the skin of your breast or nipples, such as a reddened or scaly area.  Have unusual discharge from your nipples.  Find a lump or thick area that was not there before.  Have pain in your breasts.  Have any concerns related to your breast health. Summary  Breast self-awareness   includes looking for physical changes in your breasts, as well as feeling for any changes within your breasts.  Breast self-awareness should be performed in front of a mirror in a well-lit room.  You should examine your breasts every month. If you menstruate, the best time to examine your breasts is 5-7 days after your menstrual period.  Let your health care provider know of any changes you notice in your breasts, including changes in size, changes on the skin, pain or tenderness, or unusual fluid from your nipples. This information is not intended to replace advice given to you by your health care provider. Make sure you discuss any questions you have with your health care provider. Document Revised: 01/24/2018 Document Reviewed: 01/24/2018 Elsevier Patient Education  [place].    Preventing Unhealthy [employer], Adult Staying at a healthy weight is important to your overall health. When fat builds up in your body, you may become overweight or obese. Being overweight or obese increases your risk of developing certain health problems, such as heart disease, diabetes, sleeping problems, joint problems, and some types of cancer. Unhealthy weight gain is often the result of making unhealthy food choices or not getting enough exercise. You can make changes to your lifestyle to prevent obesity and stay as healthy as possible. What nutrition changes can be made?   Eat only as  much as your body needs. To do this: ? Pay attention to signs that you are hungry or full. Stop eating as soon as you feel full. ? If you feel hungry, try drinking water first before eating. Drink enough water so your urine is clear or pale yellow. ? Eat smaller portions. Pay attention to portion sizes when eating out. ? Look at serving sizes on food labels. Most foods contain more than one serving per container. ? Eat the recommended number of calories for your gender and activity level. For most active people, a daily total of 2,000 calories is appropriate. If you are trying to lose weight or are not very active, you may need to eat fewer calories. Talk with your health care provider or a diet and nutrition specialist (dietitian) about how many calories you need each day.  Choose healthy foods, such as: ? Fruits and vegetables. At each meal, try to fill at least half of your plate with fruits and vegetables. ? Whole grains, such as whole-wheat bread, brown rice, and quinoa. ? Lean meats, such as chicken or fish. ? Other healthy proteins, such as beans, eggs, or tofu. ? Healthy fats, such as nuts, seeds, fatty fish, and olive oil. ? Low-fat or fat-free dairy products.  Check food labels, and avoid food and drinks that: ? Are high in calories. ? Have added sugar. ? Are high in sodium. ? Have saturated fats or trans fats.  Cook foods in healthier ways, such as by baking, broiling, or grilling.  Make a meal plan for the week, and shop with a grocery list to help you stay on track with your purchases. Try to avoid going to the grocery store when you are hungry.  When grocery shopping, try to shop around the outside of the store first, where the fresh foods are. Doing this helps you to avoid prepackaged foods, which can be high in sugar, salt (sodium), and fat. What lifestyle changes can be made?   Exercise for 30 or more minutes on 5 or more days each week. Exercising may include brisk  walking, yard work, biking, running, swimming, and  team sports like basketball and soccer. Ask your health care provider which exercises are safe for you.  Do muscle-strengthening activities, such as lifting weights or using resistance bands, on 2 or more days a week.  Do not use any products that contain nicotine or tobacco, such as cigarettes and e-cigarettes. If you need help quitting, ask your health care provider.  Limit alcohol intake to no more than 1 drink a day for nonpregnant women and 2 drinks a day for men. One drink equals 12 oz of beer, 5 oz of wine, or 1 oz of hard liquor.  Try to get 7-9 hours of sleep each night. What other changes can be made?  Keep a food and activity journal to keep track of: ? What you ate and how many calories you had. Remember to count the calories in sauces, dressings, and side dishes. ? Whether you were active, and what exercises you did. ? Your calorie, weight, and activity goals.  Check your weight regularly. Track any changes. If you notice you have gained weight, make changes to your diet or activity routine.  Avoid taking weight-loss medicines or supplements. Talk to your health care provider before starting any new medicine or supplement.  Talk to your health care provider before trying any new diet or exercise plan. Why are these changes important? Eating healthy, staying active, and having healthy habits can help you to prevent obesity. Those changes also:  Help you manage stress and emotions.  Help you connect with friends and family.  Improve your self-esteem.  Improve your sleep.  Prevent long-term health problems. What can happen if changes are not made? Being obese or overweight can cause you to develop joint or bone problems, which can make it hard for you to stay active or do activities you enjoy. Being obese or overweight also puts stress on your heart and lungs and can lead to health problems like diabetes, heart disease,  and some cancers. Where to find more information Talk with your health care provider or a dietitian about healthy eating and healthy lifestyle choices. You may also find information from:  [place] Department of Agriculture, My[employer]ate: FormerBoss.no  American Heart Association: www.heart.org  Centers for Disease Control and Prevention: http://www.wolf.info/ Summary  Staying at a healthy weight is important to your overall health. It helps you to prevent certain diseases and health problems, such as heart disease, diabetes, joint problems, sleep disorders, and some types of cancer.  Being obese or overweight can cause you to develop joint or bone problems, which can make it hard for you to stay active or do activities you enjoy.  You can prevent unhealthy weight gain by eating a healthy diet, exercising regularly, not smoking, limiting alcohol, and getting enough sleep.  Talk with your health care provider or a dietitian for guidance about healthy eating and healthy lifestyle choices. This information is not intended to replace advice given to you by your health care provider. Make sure you discuss any questions you have with your health care provider. Document Revised: 06/10/2017 Document Reviewed: 07/14/2016 Elsevier Patient Education  2020 [employer].

## 2019-12-13 LAB — CERVICOVAGINAL ANCILLARY ONLY
Bacterial Vaginitis (gardnerella): NEGATIVE
Candida Glabrata: NEGATIVE
Candida Vaginitis: POSITIVE — AB
Comment: NEGATIVE
Comment: NEGATIVE
Comment: NEGATIVE

## 2019-12-14 ENCOUNTER — Telehealth: Payer: Self-pay

## 2019-12-14 LAB — CYTOLOGY - PAP
Comment: NEGATIVE
Diagnosis: UNDETERMINED — AB
High risk HPV: NEGATIVE

## 2019-12-14 MED ORDER — FLUCONAZOLE 150 MG PO TABS: ORAL_TABLET | ORAL | 0 refills | Status: AC

## 2019-12-14 NOTE — Telephone Encounter (Signed)
-----   Message from Virginia Crews, MD sent at 12/13/2019  3:34 PM EDT ----- Vaginal swab positive for yeast.  Ok to eRx fluconazole 150mg  - take 1 pill once.  If symptoms persist ok to repeat in 2-3 days. #2 r0.

## 2019-12-14 NOTE — Telephone Encounter (Signed)
Patient advised as below.  

## 2019-12-18 ENCOUNTER — Telehealth: Payer: Self-pay

## 2019-12-18 NOTE — Telephone Encounter (Signed)
-----   Message from Doreen Beam, [place] sent at 12/18/2019 10:31 AM EDT ----- HPV negative. She does have atypical squamous cells on her PAP cytology she will nee referral to gynecology, can ask patient her if she has a preference or previous gynecologist  and place referral with given diagnosis: abnormal Pap, squamous cells. Note on referral would like scheduled as soon as possible .

## 2019-12-18 NOTE — Telephone Encounter (Signed)
Patient advised as below. Patient verbalizes understanding and is in agreement with treatment plan. Patient reports she will call back if she needs a referral. But reports she will look around and call GYN to schedule appt.

## 2019-12-18 NOTE — Progress Notes (Signed)
HPV negative. She does have atypical squamous cells on her PAP cytology she will nee referral to gynecology, can ask patient her if she has a preference or previous gynecologist  and place referral with given diagnosis: abnormal Pap, squamous cells. Note on referral would like scheduled as soon as possible .

## 2019-12-19 ENCOUNTER — Telehealth: Payer: Self-pay

## 2019-12-19 DIAGNOSIS — I10 Essential (primary) hypertension: Secondary | ICD-10-CM | POA: Diagnosis not present

## 2019-12-19 DIAGNOSIS — R7303 Prediabetes: Secondary | ICD-10-CM | POA: Diagnosis not present

## 2019-12-19 DIAGNOSIS — Z0000 Encounter for general adult medical examination without abnormal findings: Secondary | ICD-10-CM | POA: Diagnosis not present

## 2019-12-19 DIAGNOSIS — E7800 Pure hypercholesterolemia, unspecified: Secondary | ICD-10-CM | POA: Diagnosis not present

## 2019-12-19 DIAGNOSIS — E876 Hypokalemia: Secondary | ICD-10-CM | POA: Diagnosis not present

## 2019-12-19 NOTE — Telephone Encounter (Signed)
-----   Message from Doreen Beam, [place] sent at 12/18/2019 10:31 AM EDT ----- HPV negative. She does have atypical squamous cells on her PAP cytology she will nee referral to gynecology, can ask patient her if she has a preference or previous gynecologist  and place referral with given diagnosis: abnormal Pap, squamous cells. Note on referral would like scheduled as soon as possible .

## 2019-12-20 LAB — COMPREHENSIVE METABOLIC PANEL
ALT: 14 IU/L (ref 0–32)
AST: 20 IU/L (ref 0–40)
Albumin/Globulin Ratio: 1.8 (ref 1.2–2.2)
Albumin: 4.4 g/dL (ref 3.8–4.9)
Alkaline Phosphatase: 78 IU/L (ref 48–121)
BUN/Creatinine Ratio: 21 (ref 12–28)
BUN: 24 mg/dL (ref 8–27)
Bilirubin Total: 0.5 mg/dL (ref 0.0–1.2)
CO2: 23 mmol/L (ref 20–29)
Calcium: 9.3 mg/dL (ref 8.7–10.3)
Chloride: 103 mmol/L (ref 96–106)
Creatinine, Ser: 1.16 mg/dL — ABNORMAL HIGH (ref 0.57–1.00)
GFR calc Af Amer: 59 mL/min/{1.73_m2} — ABNORMAL LOW (ref >59)
GFR calc non Af Amer: 51 mL/min/{1.73_m2} — ABNORMAL LOW (ref >59)
Globulin, Total: 2.5 g/dL (ref 1.5–4.5)
Glucose: 114 mg/dL — ABNORMAL HIGH (ref 65–99)
Potassium: 3.8 mmol/L (ref 3.5–5.2)
Sodium: 139 mmol/L (ref 134–144)
Total Protein: 6.9 g/dL (ref 6.0–8.5)

## 2019-12-20 LAB — LIPID PANEL
Chol/HDL Ratio: 3.4 ratio (ref 0.0–4.4)
Cholesterol, Total: 137 mg/dL (ref 100–199)
HDL: 40 mg/dL (ref >39)
LDL Chol Calc (NIH): 73 mg/dL (ref 0–99)
Triglycerides: 138 mg/dL (ref 0–149)
VLDL Cholesterol Cal: 24 mg/dL (ref 5–40)

## 2019-12-20 LAB — HEMOGLOBIN A1C
Est. average glucose Bld gHb Est-mCnc: 126 mg/dL
Hgb A1c MFr Bld: 6 % — ABNORMAL HIGH (ref 4.8–5.6)

## 2019-12-20 LAB — HIV ANTIBODY (ROUTINE TESTING W REFLEX): HIV Screen 4th Generation wRfx: NONREACTIVE

## 2019-12-23 ENCOUNTER — Other Ambulatory Visit: Payer: Self-pay

## 2019-12-23 ENCOUNTER — Inpatient Hospital Stay
Admission: EM | Admit: 2019-12-23 | Discharge: 2019-12-26 | DRG: 872 | Disposition: A | Discharge status: Home / Self Care | Payer: BC Managed Care – PPO | Attending: Family Medicine | Admitting: Family Medicine

## 2019-12-23 ENCOUNTER — Encounter: Payer: Self-pay | Admitting: Emergency Medicine

## 2019-12-23 ENCOUNTER — Emergency Department: Payer: BC Managed Care – PPO

## 2019-12-23 DIAGNOSIS — F419 Anxiety disorder, unspecified: Secondary | ICD-10-CM | POA: Diagnosis present

## 2019-12-23 DIAGNOSIS — A419 Sepsis, unspecified organism: Principal | ICD-10-CM | POA: Diagnosis present

## 2019-12-23 DIAGNOSIS — G43909 Migraine, unspecified, not intractable, without status migrainosus: Secondary | ICD-10-CM | POA: Diagnosis present

## 2019-12-23 DIAGNOSIS — Z882 Allergy status to sulfonamides status: Secondary | ICD-10-CM | POA: Diagnosis not present

## 2019-12-23 DIAGNOSIS — R509 Fever, unspecified: Secondary | ICD-10-CM | POA: Diagnosis not present

## 2019-12-23 DIAGNOSIS — F339 Major depressive disorder, recurrent, unspecified: Secondary | ICD-10-CM | POA: Diagnosis not present

## 2019-12-23 DIAGNOSIS — E876 Hypokalemia: Secondary | ICD-10-CM | POA: Diagnosis present

## 2019-12-23 DIAGNOSIS — L03116 Cellulitis of left lower limb: Secondary | ICD-10-CM | POA: Diagnosis not present

## 2019-12-23 DIAGNOSIS — I872 Venous insufficiency (chronic) (peripheral): Secondary | ICD-10-CM | POA: Diagnosis not present

## 2019-12-23 DIAGNOSIS — Z8249 Family history of ischemic heart disease and other diseases of the circulatory system: Secondary | ICD-10-CM | POA: Diagnosis not present

## 2019-12-23 DIAGNOSIS — M1990 Unspecified osteoarthritis, unspecified site: Secondary | ICD-10-CM | POA: Diagnosis present

## 2019-12-23 DIAGNOSIS — R7303 Prediabetes: Secondary | ICD-10-CM | POA: Diagnosis present

## 2019-12-23 DIAGNOSIS — Z888 Allergy status to other drugs, medicaments and biological substances status: Secondary | ICD-10-CM | POA: Diagnosis not present

## 2019-12-23 DIAGNOSIS — F329 Major depressive disorder, single episode, unspecified: Secondary | ICD-10-CM | POA: Diagnosis not present

## 2019-12-23 DIAGNOSIS — Z833 Family history of diabetes mellitus: Secondary | ICD-10-CM | POA: Diagnosis not present

## 2019-12-23 DIAGNOSIS — Z8616 Personal history of COVID-19: Secondary | ICD-10-CM | POA: Diagnosis not present

## 2019-12-23 DIAGNOSIS — Z6841 Body Mass Index (BMI) 40.0 and over, adult: Secondary | ICD-10-CM | POA: Diagnosis not present

## 2019-12-23 DIAGNOSIS — Z880 Allergy status to penicillin: Secondary | ICD-10-CM | POA: Diagnosis not present

## 2019-12-23 DIAGNOSIS — Z79899 Other long term (current) drug therapy: Secondary | ICD-10-CM

## 2019-12-23 DIAGNOSIS — G43009 Migraine without aura, not intractable, without status migrainosus: Secondary | ICD-10-CM

## 2019-12-23 DIAGNOSIS — Z96653 Presence of artificial knee joint, bilateral: Secondary | ICD-10-CM | POA: Diagnosis present

## 2019-12-23 DIAGNOSIS — K219 Gastro-esophageal reflux disease without esophagitis: Secondary | ICD-10-CM | POA: Diagnosis not present

## 2019-12-23 DIAGNOSIS — I890 Lymphedema, not elsewhere classified: Secondary | ICD-10-CM

## 2019-12-23 DIAGNOSIS — G43709 Chronic migraine without aura, not intractable, without status migrainosus: Secondary | ICD-10-CM | POA: Diagnosis not present

## 2019-12-23 DIAGNOSIS — Z87891 Personal history of nicotine dependence: Secondary | ICD-10-CM | POA: Diagnosis not present

## 2019-12-23 LAB — CBC WITH DIFFERENTIAL/PLATELET
Abs Immature Granulocytes: 0.18 10*3/uL — ABNORMAL HIGH (ref 0.00–0.07)
Basophils Absolute: 0.1 10*3/uL (ref 0.0–0.1)
Basophils Relative: 0 %
Eosinophils Absolute: 0 10*3/uL (ref 0.0–0.5)
Eosinophils Relative: 0 %
HCT: 43.6 % (ref 36.0–46.0)
Hemoglobin: 15.3 g/dL — ABNORMAL HIGH (ref 12.0–15.0)
Immature Granulocytes: 1 %
Lymphocytes Relative: 4 %
Lymphs Abs: 0.7 10*3/uL (ref 0.7–4.0)
MCH: 31.2 pg (ref 26.0–34.0)
MCHC: 35.1 g/dL (ref 30.0–36.0)
MCV: 88.8 fL (ref 80.0–100.0)
Monocytes Absolute: 1.2 10*3/uL — ABNORMAL HIGH (ref 0.1–1.0)
Monocytes Relative: 6 %
Neutro Abs: 17.4 10*3/uL — ABNORMAL HIGH (ref 1.7–7.7)
Neutrophils Relative %: 89 %
Platelets: 229 10*3/uL (ref 150–400)
RBC: 4.91 MIL/uL (ref 3.87–5.11)
RDW: 13.7 % (ref 11.5–15.5)
WBC: 19.6 10*3/uL — ABNORMAL HIGH (ref 4.0–10.5)
nRBC: 0 % (ref 0.0–0.2)

## 2019-12-23 LAB — COMPREHENSIVE METABOLIC PANEL
ALT: 17 U/L (ref 0–44)
AST: 28 U/L (ref 15–41)
Albumin: 4.1 g/dL (ref 3.5–5.0)
Alkaline Phosphatase: 59 U/L (ref 38–126)
Anion gap: 12 (ref 5–15)
BUN: 24 mg/dL — ABNORMAL HIGH (ref 6–20)
CO2: 22 mmol/L (ref 22–32)
Calcium: 8.7 mg/dL — ABNORMAL LOW (ref 8.9–10.3)
Chloride: 103 mmol/L (ref 98–111)
Creatinine, Ser: 1.4 mg/dL — ABNORMAL HIGH (ref 0.44–1.00)
GFR calc Af Amer: 47 mL/min — ABNORMAL LOW (ref >60)
GFR calc non Af Amer: 41 mL/min — ABNORMAL LOW (ref >60)
Glucose, Bld: 127 mg/dL — ABNORMAL HIGH (ref 70–99)
Potassium: 3.2 mmol/L — ABNORMAL LOW (ref 3.5–5.1)
Sodium: 137 mmol/L (ref 135–145)
Total Bilirubin: 0.8 mg/dL (ref 0.3–1.2)
Total Protein: 6.9 g/dL (ref 6.5–8.1)

## 2019-12-23 LAB — URINALYSIS, COMPLETE (UACMP) WITH MICROSCOPIC
Bilirubin Urine: NEGATIVE
Glucose, UA: NEGATIVE mg/dL
Hgb urine dipstick: NEGATIVE
Ketones, ur: NEGATIVE mg/dL
Leukocytes,Ua: NEGATIVE
Nitrite: NEGATIVE
Protein, ur: NEGATIVE mg/dL
Specific Gravity, Urine: 1.013 (ref 1.005–1.030)
pH: 5 (ref 5.0–8.0)

## 2019-12-23 LAB — SARS CORONAVIRUS 2 BY RT PCR (HOSPITAL ORDER, PERFORMED IN ~~LOC~~ HOSPITAL LAB): SARS Coronavirus 2: NEGATIVE

## 2019-12-23 LAB — LACTIC ACID, PLASMA
Lactic Acid, Venous: 2.7 mmol/L (ref 0.5–1.9)
Lactic Acid, Venous: 2.9 mmol/L (ref 0.5–1.9)

## 2019-12-23 MED ORDER — TOPIRAMATE 25 MG PO TABS
50.0000 mg | ORAL_TABLET | Freq: Every day | ORAL | Status: DC
  Administered 2019-12-24 – 2019-12-25 (×2): 50 mg via ORAL
  Filled 2019-12-23 (×4): qty 2

## 2019-12-23 MED ORDER — SODIUM CHLORIDE 0.9 % IV SOLN: INTRAVENOUS | Status: DC

## 2019-12-23 MED ORDER — ENOXAPARIN SODIUM 40 MG/0.4ML ~~LOC~~ SOLN
40.0000 mg | Freq: Two times a day (BID) | SUBCUTANEOUS | Status: DC
  Administered 2019-12-24 – 2019-12-26 (×6): 40 mg via SUBCUTANEOUS
  Filled 2019-12-23 (×6): qty 0.4

## 2019-12-23 MED ORDER — ACETAMINOPHEN 325 MG PO TABS
650.0000 mg | ORAL_TABLET | Freq: Once | ORAL | Status: AC | PRN
  Administered 2019-12-23: 650 mg via ORAL
  Filled 2019-12-23: qty 2

## 2019-12-23 MED ORDER — BACLOFEN 10 MG PO TABS
10.0000 mg | ORAL_TABLET | Freq: Two times a day (BID) | ORAL | Status: DC | PRN
  Administered 2019-12-24 – 2019-12-25 (×3): 10 mg via ORAL
  Filled 2019-12-23 (×5): qty 1

## 2019-12-23 MED ORDER — GABAPENTIN 600 MG PO TABS
600.0000 mg | ORAL_TABLET | Freq: Every day | ORAL | Status: DC
  Administered 2019-12-24 – 2019-12-25 (×3): 600 mg via ORAL
  Filled 2019-12-23 (×3): qty 1

## 2019-12-23 MED ORDER — METRONIDAZOLE IN NACL 5-0.79 MG/ML-% IV SOLN: 500.0000 mg | Freq: Three times a day (TID) | INTRAVENOUS | Status: DC

## 2019-12-23 MED ORDER — AZELASTINE HCL 0.1 % NA SOLN
1.0000 | Freq: Every evening | NASAL | Status: DC | PRN
  Filled 2019-12-23: qty 30

## 2019-12-23 MED ORDER — VANCOMYCIN HCL IN DEXTROSE 1-5 GM/200ML-% IV SOLN
1000.0000 mg | Freq: Once | INTRAVENOUS | Status: DC
  Administered 2019-12-24: 1000 mg via INTRAVENOUS
  Filled 2019-12-23: qty 200

## 2019-12-23 MED ORDER — ONDANSETRON HCL 4 MG PO TABS: 4.0000 mg | ORAL_TABLET | Freq: Four times a day (QID) | ORAL | Status: DC | PRN

## 2019-12-23 MED ORDER — SUMATRIPTAN SUCCINATE 50 MG PO TABS
50.0000 mg | ORAL_TABLET | ORAL | Status: DC | PRN
  Filled 2019-12-23: qty 1

## 2019-12-23 MED ORDER — ASPIRIN-ACETAMINOPHEN-CAFFEINE 250-250-65 MG PO TABS: 2.0000 | ORAL_TABLET | Freq: Three times a day (TID) | ORAL | Status: DC | PRN

## 2019-12-23 MED ORDER — SODIUM CHLORIDE 0.9 % IV SOLN: 1.0000 g | INTRAVENOUS | Status: DC

## 2019-12-23 MED ORDER — BUTALBITAL-APAP-CAFFEINE 50-325-40 MG PO TABS
1.0000 | ORAL_TABLET | Freq: Four times a day (QID) | ORAL | Status: DC | PRN
  Administered 2019-12-24: 1 via ORAL
  Administered 2019-12-24: 2 via ORAL
  Administered 2019-12-24: 1 via ORAL
  Administered 2019-12-25: 2 via ORAL
  Filled 2019-12-23 (×2): qty 1
  Filled 2019-12-23 (×3): qty 2

## 2019-12-23 MED ORDER — TORSEMIDE 20 MG PO TABS
40.0000 mg | ORAL_TABLET | Freq: Every day | ORAL | Status: DC
  Administered 2019-12-24 – 2019-12-26 (×3): 40 mg via ORAL
  Filled 2019-12-23 (×4): qty 2

## 2019-12-23 MED ORDER — SODIUM CHLORIDE 0.9 % IV BOLUS
1000.0000 mL | Freq: Once | INTRAVENOUS | Status: AC
  Administered 2019-12-23: 1000 mL via INTRAVENOUS

## 2019-12-23 MED ORDER — TRAZODONE HCL 50 MG PO TABS: 25.0000 mg | ORAL_TABLET | Freq: Every evening | ORAL | Status: DC | PRN

## 2019-12-23 MED ORDER — PANTOPRAZOLE SODIUM 40 MG PO TBEC
40.0000 mg | DELAYED_RELEASE_TABLET | Freq: Every day | ORAL | Status: DC
  Administered 2019-12-24 – 2019-12-26 (×3): 40 mg via ORAL
  Filled 2019-12-23 (×3): qty 1

## 2019-12-23 MED ORDER — BUPROPION HCL 100 MG PO TABS
100.0000 mg | ORAL_TABLET | Freq: Two times a day (BID) | ORAL | Status: DC
  Administered 2019-12-24 – 2019-12-26 (×5): 100 mg via ORAL
  Filled 2019-12-23 (×8): qty 1

## 2019-12-23 MED ORDER — VANCOMYCIN HCL IN DEXTROSE 1-5 GM/200ML-% IV SOLN: 1000.0000 mg | Freq: Once | INTRAVENOUS | Status: DC

## 2019-12-23 MED ORDER — MAGNESIUM HYDROXIDE 400 MG/5ML PO SUSP: 30.0000 mL | Freq: Every day | ORAL | Status: DC | PRN

## 2019-12-23 MED ORDER — SODIUM CHLORIDE 0.9 % IV SOLN: 2.0000 g | Freq: Two times a day (BID) | INTRAVENOUS | Status: DC

## 2019-12-23 MED ORDER — ALBUTEROL SULFATE (2.5 MG/3ML) 0.083% IN NEBU: 2.5000 mg | INHALATION_SOLUTION | Freq: Four times a day (QID) | RESPIRATORY_TRACT | Status: DC | PRN

## 2019-12-23 MED ORDER — SODIUM CHLORIDE 0.9 % IV BOLUS (SEPSIS)
1000.0000 mL | Freq: Once | INTRAVENOUS | Status: AC
  Administered 2019-12-23: 1000 mL via INTRAVENOUS

## 2019-12-23 MED ORDER — SODIUM CHLORIDE 0.9 % IV SOLN
2.0000 g | Freq: Once | INTRAVENOUS | Status: AC
  Administered 2019-12-23: 2 g via INTRAVENOUS
  Filled 2019-12-23: qty 2

## 2019-12-23 MED ORDER — ACETAMINOPHEN 325 MG PO TABS
650.0000 mg | ORAL_TABLET | Freq: Four times a day (QID) | ORAL | Status: DC | PRN
  Administered 2019-12-24: 650 mg via ORAL
  Filled 2019-12-23: qty 2

## 2019-12-23 MED ORDER — ACETAMINOPHEN 650 MG RE SUPP: 650.0000 mg | Freq: Four times a day (QID) | RECTAL | Status: DC | PRN

## 2019-12-23 MED ORDER — POTASSIUM CHLORIDE 20 MEQ/15ML (10%) PO SOLN
40.0000 meq | Freq: Every day | ORAL | Status: DC
  Administered 2019-12-24: 40 meq via ORAL
  Filled 2019-12-23 (×3): qty 30

## 2019-12-23 MED ORDER — METOCLOPRAMIDE HCL 5 MG/ML IJ SOLN
10.0000 mg | Freq: Once | INTRAMUSCULAR | Status: AC
  Administered 2019-12-23: 10 mg via INTRAVENOUS
  Filled 2019-12-23: qty 2

## 2019-12-23 MED ORDER — ONDANSETRON HCL 4 MG/2ML IJ SOLN: 4.0000 mg | Freq: Four times a day (QID) | INTRAMUSCULAR | Status: DC | PRN

## 2019-12-23 MED ORDER — ESCITALOPRAM OXALATE 10 MG PO TABS
20.0000 mg | ORAL_TABLET | Freq: Every day | ORAL | Status: DC
  Administered 2019-12-24 – 2019-12-26 (×3): 20 mg via ORAL
  Filled 2019-12-23 (×3): qty 2

## 2019-12-23 MED ORDER — ADULT MULTIVITAMIN W/MINERALS CH
1.0000 | ORAL_TABLET | Freq: Every day | ORAL | Status: DC
  Administered 2019-12-24 – 2019-12-26 (×3): 1 via ORAL
  Filled 2019-12-23 (×3): qty 1

## 2019-12-23 MED ORDER — VANCOMYCIN HCL IN DEXTROSE 1-5 GM/200ML-% IV SOLN
1000.0000 mg | Freq: Once | INTRAVENOUS | Status: AC
  Administered 2019-12-23: 1000 mg via INTRAVENOUS
  Filled 2019-12-23: qty 200

## 2019-12-23 NOTE — ED Provider Notes (Signed)
[facility] Emergency Department Provider Note ____________________________________________   First MD Initiated Contact with Patient 12/23/19 1755     (approximate)  I have reviewed the triage vital signs and the nursing notes.   HISTORY  Chief Complaint Fever    HPI Rachael Brewer is a 59 y.o. female with PMH as noted below who presents with fever, acute onset earlier today, measured to as high as 102 at home, and associated with generalized weakness, body aches, severe chills/shakes and an episode of slight confusion.  The patient also reports nausea, and has had a headache over the last few days which is similar to prior migraines.  She denies any neck pain or stiffness.  She has no associated cough or shortness of breath, abdominal pain, diarrhea, or urinary symptoms.  She states that she received her first COVID-19 vaccine last week.  Past Medical History:  Diagnosis Date  . Anxiety   . Arthritis   . Complication of anesthesia    CAUSES MIGRAINE  . Depression   . Edema of both lower extremities   . GERD (gastroesophageal reflux disease)   . Migraines   . Pre-diabetes   . Sleep apnea    CPAP    Patient Active Problem List   Diagnosis Date Noted  . Sepsis ([place]) 12/23/2019  . Vaginal discharge 12/12/2019  . Pre-diabetes 12/12/2019  . Total knee replacement status 05/02/2019  . Lymphedema 05/07/2016  . Morbid obesity ([place]) 01/01/2016  . Venous insufficiency 01/01/2016  . Pes planus of both feet 01/01/2016  . Apnea 01/01/2016  . AB (asthmatic bronchitis) 06/10/2015  . Allergic rhinitis 06/10/2015  . Benign paroxysmal positional nystagmus 06/10/2015  . Acid reflux 06/10/2015  . Lesion of nose 06/10/2015  . Low back strain 06/10/2015  . Plantar fasciitis 12/02/2014  . Posterior tibial tendonitis 12/02/2014  . Arthropathia 12/18/2008  . Depression, recurrent ([place]) 07/21/2006  . Hypercholesteremia 07/21/2006  . BP (high blood pressure)  07/21/2006  . Headache, migraine 07/21/2006    Past Surgical History:  Procedure Laterality Date  . ANTERIOR CRUCIATE LIGAMENT REPAIR Right 1996  . DILATION AND CURETTAGE OF UTERUS  2004  . FOOT SURGERY Bilateral   . JOINT REPLACEMENT Right   . KNEE ARTHROPLASTY Right 05/02/2019   Procedure: COMPUTER ASSISTED TOTAL KNEE ARTHROPLASTY;  Surgeon: Dereck Leep, MD;  Location: [facility] ORS;  Service: Orthopedics;  Laterality: Right;  . KNEE ARTHROPLASTY Left 07/09/2019   Procedure: COMPUTER ASSISTED TOTAL KNEE ARTHROPLASTY;  Surgeon: Dereck Leep, MD;  Location: [facility] ORS;  Service: Orthopedics;  Laterality: Left;  . KNEE SURGERY Right   . MENISCUS REPAIR Bilateral     Prior to Admission medications   Medication Sig Start Date End Date Taking? Authorizing Provider  albuterol (VENTOLIN HFA) 108 (90 Base) MCG/ACT inhaler Inhale 1-2 puffs into the lungs every 6 (six) hours as needed for wheezing or shortness of breath. 08/04/17  Yes Bacigalupo, Dionne Bucy, MD  aspirin-acetaminophen-caffeine (EXCEDRIN MIGRAINE) [phone] MG tablet Take 2 tablets by mouth every 8 (eight) hours as needed for migraine.    Yes [provider]  Azelastine HCl 137 MCG/SPRAY SOLN Place 1 spray into both nostrils at bedtime.    Yes [provider]  baclofen (LIORESAL) 10 MG tablet Take 10 mg by mouth 2 (two) times daily as needed for muscle spasms. LIMIT 2 DAYS PER WEEK 02/23/14  Yes [provider]  buPROPion (WELLBUTRIN) 100 MG tablet Take 1 tablet (100 mg total) by mouth 2 (  two) times daily. 12/12/19  Yes Bacigalupo, Dionne Bucy, MD  butalbital-acetaminophen-caffeine (FIORICET) [phone] MG tablet Take 1-2 tablets by mouth every 6 (six) hours as needed for headache. 08/31/19 08/30/20 Yes Bacigalupo, Dionne Bucy, MD  celecoxib (CELEBREX) 200 MG capsule Take 1 capsule (200 mg total) by mouth 2 (two) times daily. 07/09/19 07/08/20 Yes Hooten, Laurice Record, MD  escitalopram (LEXAPRO) 20 MG tablet Take 1 tablet (20 mg  total) by mouth daily. 12/12/19  Yes Bacigalupo, Dionne Bucy, MD  gabapentin (NEURONTIN) 600 MG tablet Take 1,200 mg by mouth at bedtime.  02/14/14  Yes [provider]  Multiple Vitamin (MULTIVITAMIN WITH MINERALS) TABS tablet Take 1 tablet by mouth daily.   Yes [provider]  omeprazole (PRILOSEC) 40 MG capsule Take 1 capsule (40 mg total) by mouth daily. 12/12/19  Yes Bacigalupo, Dionne Bucy, MD  Potassium Chloride 40 MEQ/15ML (20%) SOLN Take 40 mEq by mouth daily. 12/12/19  Yes Bacigalupo, Dionne Bucy, MD  rizatriptan (MAXALT) 10 MG tablet Take 10 mg by mouth every 2 (two) hours as needed for migraine (max 2 tablets/24 hrs.).  10/13/15  Yes [provider]  topiramate ER (QUDEXY XR) 150 MG CS24 sprinkle capsule Take 150 mg by mouth at bedtime.    Yes [provider]  torsemide (DEMADEX) 20 MG tablet Take 2 tablets (40 mg total) by mouth daily. 12/12/19  Yes Bacigalupo, Dionne Bucy, MD    Allergies Bactrim [sulfamethoxazole-trimethoprim], Sulfa antibiotics, Sumatriptan, and Penicillins  Family History  Problem Relation Age of Onset  . Hypertension Brother   . Diabetes Brother   . Diabetes Other   . Hypertension Other   . Glaucoma Other   . Breast cancer Maternal Aunt        50's    Social History Social History   Tobacco Use  . Smoking status: Former Smoker    Packs/day: 1.00    Years: 15.00    Pack years: 15.00    Quit date: 06/21/1999    Years since quitting: 20.5  . Smokeless tobacco: Never Used  Vaping Use  . Vaping Use: Never used  Substance Use Topics  . Alcohol use: Yes    Alcohol/week: 0.0 standard drinks    Comment: occasional  . Drug use: No    Review of Systems  Constitutional: Positive for fever. Eyes: No redness. ENT: No sore throat. Cardiovascular: Denies chest pain. Respiratory: Denies shortness of breath. Gastrointestinal: Positive for nausea.  No vomiting or diarrhea. Genitourinary: Negative for dysuria.  Musculoskeletal:  Negative for back pain. Skin: Negative for rash. Neurological: Positive for headache.   ____________________________________________   PHYSICAL EXAM:  VITAL SIGNS: ED Triage Vitals  Enc Vitals Group     BP 12/23/19 1654 115/62     Pulse Rate 12/23/19 1654 98     Resp 12/23/19 1654 18     Temp 12/23/19 1654 (!) 103.1 F (39.5 C)     Temp Source 12/23/19 1654 Oral     SpO2 12/23/19 1654 94 %     Weight 12/23/19 1655 257 lb (116.6 kg)     Height 12/23/19 1655 5\' 3"  (1.6 m)     Head Circumference --      Peak Flow --      Pain Score 12/23/19 1654 8     Pain Loc --      Pain Edu? --      Excl. in [place]? --     Constitutional: Alert and oriented.  Relatively well appearing and in  no acute distress. Eyes: Conjunctivae are normal.  EOMI. Head: Atraumatic. Nose: No congestion/rhinnorhea. Mouth/Throat: Mucous membranes are dry.   Neck: Normal range of motion.  Supple.  No meningeal signs. Cardiovascular: Normal rate, regular rhythm. Grossly normal heart sounds.  Good peripheral circulation. Respiratory: Normal respiratory effort.  No retractions. Lungs CTAB. Gastrointestinal: Soft and nontender. No distention.  Genitourinary: No flank tenderness. Musculoskeletal: No lower extremity edema.  Extremities warm and well perfused.  Neurologic:  Normal speech and language. No gross focal neurologic deficits are appreciated.  Skin:  Skin is warm and dry. No rash noted. Psychiatric: Mood and affect are normal. Speech and behavior are normal.  ____________________________________________   LABS (all labs ordered are listed, but only abnormal results are displayed)  Labs Reviewed  LACTIC ACID, PLASMA - Abnormal; Notable for the following components:      Result Value   Lactic Acid, Venous 2.7 (*)    All other components within normal limits  LACTIC ACID, PLASMA - Abnormal; Notable for the following components:   Lactic Acid, Venous 2.9 (*)    All other components within normal limits   COMPREHENSIVE METABOLIC PANEL - Abnormal; Notable for the following components:   Potassium 3.2 (*)    Glucose, Bld 127 (*)    BUN 24 (*)    Creatinine, Ser 1.40 (*)    Calcium 8.7 (*)    GFR calc non Af Amer 41 (*)    GFR calc Af Amer 47 (*)    All other components within normal limits  CBC WITH DIFFERENTIAL/PLATELET - Abnormal; Notable for the following components:   WBC 19.6 (*)    Hemoglobin 15.3 (*)    Neutro Abs 17.4 (*)    Monocytes Absolute 1.2 (*)    Abs Immature Granulocytes 0.18 (*)    All other components within normal limits  URINALYSIS, COMPLETE (UACMP) WITH MICROSCOPIC - Abnormal; Notable for the following components:   Color, Urine YELLOW (*)    APPearance HAZY (*)    Bacteria, UA RARE (*)    All other components within normal limits  SARS CORONAVIRUS 2 BY RT PCR (HOSPITAL ORDER, [facility])  CULTURE, BLOOD (ROUTINE X 2)  CULTURE, BLOOD (ROUTINE X 2)   ____________________________________________  EKG   ____________________________________________  RADIOLOGY  CXR: No focal infiltrate or edema with  ____________________________________________   PROCEDURES  Procedure(s) performed: No  Procedures  Critical Care performed: No ____________________________________________   INITIAL IMPRESSION / ASSESSMENT AND PLAN / ED COURSE  Pertinent labs & imaging results that were available during my care of the patient were reviewed by me and considered in my medical decision making (see chart for details).  59 year old female with PMH as noted above presents with fever, malaise, body aches, nausea and nonspecific symptoms which started acutely today.  She has had a migraine headache for the last few days but this is similar to episode she has had in the past.  I reviewed the past medical records in [place].  The patient was most recently admitted last November with a right knee arthroplasty.  She has received 1 dose of her COVID-19  vaccine.  On exam, she is overall relatively well-appearing.  She is febrile with otherwise normal vital signs.  The physical exam is generally unremarkable with clear lungs and no abdominal tenderness.  She does have somewhat dry mucous membranes.  Presentation is consistent with acute infection although the source is unclear given the lack of focal symptoms.  Differential includes  pneumonia, COVID-19, UTI.  The patient has no meningeal signs and minimal headache at this time, so there is no clinical evidence for meningitis.  We will obtain lab work-up, give fluids and antipyretic, and reassess.  I anticipate admission.  ----------------------------------------- 10:58 PM on 12/23/2019 -----------------------------------------  Work-up so far does not reveal source of infection. The UA and chest x-ray are unremarkable. Repeat lactate after 1 L of normal saline is slightly higher, so I have given additional fluids per the sepsis protocol as well as broad-spectrum antibiotics. I discussed the results of the work-up with the patient. I then discussed her case with Dr. Sidney Ace from the hospitalist service.  ____________________________________________   FINAL CLINICAL IMPRESSION(S) / ED DIAGNOSES  Final diagnoses:  Sepsis, due to unspecified organism, unspecified whether acute organ dysfunction present [facility])      NEW MEDICATIONS STARTED DURING THIS VISIT:  New Prescriptions   No medications on file     Note:  This document was prepared using Dragon voice recognition software and may include unintentional dictation errors.    Arta Silence, MD 12/23/19 2259

## 2019-12-23 NOTE — ED Triage Notes (Signed)
Pt here for fever.  C/o headache.  Does not appear to feel well.  + nausea.  No diarrhea or abdominal pain.  Denies urinary sx. No cough.  Unlabored.

## 2019-12-23 NOTE — ED Notes (Signed)
One set blood cx drawn by Janett Billow RN and sent to lab if needed.

## 2019-12-23 NOTE — Progress Notes (Signed)
PHARMACY -  BRIEF ANTIBIOTIC NOTE   Pharmacy has received consult(s) for vanc/cefepime from an ED provider.  The patient's profile has been reviewed for ht/wt/allergies/indication/available labs.    One time order(s) placed for vanc 2g IV load and cefepime 2g IV x 1  Further antibiotics/pharmacy consults should be ordered by admitting physician if indicated.                       Thank you,  Tobie Lords, PharmD, BCPS Clinical Pharmacist 12/23/2019  10:19 PM

## 2019-12-23 NOTE — H&P (Signed)
[place] [facility] [place] NAME: Rachael Brewer    MR#:  [id number]  DATE OF BIRTH:  10-Sep-1960  DATE OF ADMISSION:  12/23/2019  PRIMARY CARE PHYSICIAN: Virginia Crews, MD   REQUESTING/REFERRING PHYSICIAN: Arta Silence, MD   CHIEF COMPLAINT:   Chief Complaint  Patient presents with  . Fever    HISTORY OF PRESENT ILLNESS:  Rachael Brewer  is a 59 y.o. Caucasian  female with a known history of anxiety/depression, and GERD, migraine, sleep apnea on CPAP, who presented to the emergency room with acute onset of fever, chills and body aches as well as malaise.  She has been having headache over the last couple of days.  She has been having nausea without vomiting or diarrhea.  No cough or wheezing or dyspnea.  She has been having left lower extremity erythema with mild tenderness and swelling.  Upon presentation to the emergency room, temperature was 103.1 with otherwise normal vital signs.  Labs revealed hypokalemia 3.2 and a BUN of 24 with creatinine 1.4 compared to 24/1.16 on 6/30.  Lactic acid was 2.7.  CBC showed leukocytosis of 19.6 with neutrophilia.  COVID-19 PCR came back negative.  UA was unremarkable.  Two-view chest x-ray showed no acute cardiopulmonary disease.  The patient was given Tylenol 650 mg p.o. as well as 10 mg of IV Reglan, 3 L bolus of IV normal saline as well as IV vancomycin and Zosyn.  She will be admitted to a PCU bed for further evaluation and management. PAST MEDICAL HISTORY:   Past Medical History:  Diagnosis Date  . Anxiety   . Arthritis   . Complication of anesthesia    CAUSES MIGRAINE  . Depression   . Edema of both lower extremities   . GERD (gastroesophageal reflux disease)   . Migraines   . Pre-diabetes   . Sleep apnea    CPAP    PAST SURGICAL HISTORY:   Past Surgical History:  Procedure Laterality Date  . ANTERIOR CRUCIATE LIGAMENT REPAIR Right 1996  . DILATION AND CURETTAGE OF UTERUS  2004  . FOOT  SURGERY Bilateral   . JOINT REPLACEMENT Right   . KNEE ARTHROPLASTY Right 05/02/2019   Procedure: COMPUTER ASSISTED TOTAL KNEE ARTHROPLASTY;  Surgeon: Dereck Leep, MD;  Location: [facility] ORS;  Service: Orthopedics;  Laterality: Right;  . KNEE ARTHROPLASTY Left 07/09/2019   Procedure: COMPUTER ASSISTED TOTAL KNEE ARTHROPLASTY;  Surgeon: Dereck Leep, MD;  Location: [facility] ORS;  Service: Orthopedics;  Laterality: Left;  . KNEE SURGERY Right   . MENISCUS REPAIR Bilateral     SOCIAL HISTORY:   Social History   Tobacco Use  . Smoking status: Former Smoker    Packs/day: 1.00    Years: 15.00    Pack years: 15.00    Quit date: 06/21/1999    Years since quitting: 20.5  . Smokeless tobacco: Never Used  Substance Use Topics  . Alcohol use: Yes    Alcohol/week: 0.0 standard drinks    Comment: occasional    FAMILY HISTORY:   Family History  Problem Relation Age of Onset  . Hypertension Brother   . Diabetes Brother   . Diabetes Other   . Hypertension Other   . Glaucoma Other   . Breast cancer Maternal Aunt        50's    DRUG ALLERGIES:   Allergies  Allergen Reactions  . Bactrim [Sulfamethoxazole-Trimethoprim] Shortness Of Breath and Swelling  . Sulfa Antibiotics  Anaphylaxis  . Sumatriptan     Made migraine more intense and scalp felt like it was on fire  . Penicillins Hives    Did it involve swelling of the face/tongue/throat, SOB, or low BP? No Did it involve sudden or severe rash/hives, skin peeling, or any reaction on the inside of your mouth or nose? No Did you need to seek medical attention at a hospital or doctor's office? No When did it last happen?10 years ago If all above answers are "NO", may proceed with cephalosporin use.     REVIEW OF SYSTEMS:   ROS As per history of present illness. All pertinent systems were reviewed above. Constitutional,  HEENT, cardiovascular, respiratory, GI, GU, musculoskeletal, neuro, psychiatric, endocrine,  integumentary  and hematologic systems were reviewed and are otherwise  negative/unremarkable except for positive findings mentioned above in the HPI.   MEDICATIONS AT HOME:   Prior to Admission medications   Medication Sig Start Date End Date Taking? Authorizing Provider  albuterol (VENTOLIN HFA) 108 (90 Base) MCG/ACT inhaler Inhale 1-2 puffs into the lungs every 6 (six) hours as needed for wheezing or shortness of breath. 08/04/17  Yes Bacigalupo, Dionne Bucy, MD  aspirin-acetaminophen-caffeine (EXCEDRIN MIGRAINE) [phone] MG tablet Take 2 tablets by mouth every 8 (eight) hours as needed for migraine.    Yes [provider]  Azelastine HCl 137 MCG/SPRAY SOLN Place 1 spray into both nostrils at bedtime.    Yes [provider]  baclofen (LIORESAL) 10 MG tablet Take 10 mg by mouth 2 (two) times daily as needed for muscle spasms. LIMIT 2 DAYS PER WEEK 02/23/14  Yes [provider]  buPROPion (WELLBUTRIN) 100 MG tablet Take 1 tablet (100 mg total) by mouth 2 (two) times daily. 12/12/19  Yes Bacigalupo, Dionne Bucy, MD  butalbital-acetaminophen-caffeine (FIORICET) [phone] MG tablet Take 1-2 tablets by mouth every 6 (six) hours as needed for headache. 08/31/19 08/30/20 Yes Bacigalupo, Dionne Bucy, MD  celecoxib (CELEBREX) 200 MG capsule Take 1 capsule (200 mg total) by mouth 2 (two) times daily. 07/09/19 07/08/20 Yes Hooten, Laurice Record, MD  escitalopram (LEXAPRO) 20 MG tablet Take 1 tablet (20 mg total) by mouth daily. 12/12/19  Yes Bacigalupo, Dionne Bucy, MD  gabapentin (NEURONTIN) 600 MG tablet Take 1,200 mg by mouth at bedtime.  02/14/14  Yes [provider]  Multiple Vitamin (MULTIVITAMIN WITH MINERALS) TABS tablet Take 1 tablet by mouth daily.   Yes [provider]  omeprazole (PRILOSEC) 40 MG capsule Take 1 capsule (40 mg total) by mouth daily. 12/12/19  Yes Bacigalupo, Dionne Bucy, MD  Potassium Chloride 40 MEQ/15ML (20%) SOLN Take 40 mEq by mouth daily. 12/12/19  Yes Bacigalupo, Dionne Bucy,  MD  rizatriptan (MAXALT) 10 MG tablet Take 10 mg by mouth every 2 (two) hours as needed for migraine (max 2 tablets/24 hrs.).  10/13/15  Yes [provider]  topiramate ER (QUDEXY XR) 150 MG CS24 sprinkle capsule Take 150 mg by mouth at bedtime.    Yes [provider]  torsemide (DEMADEX) 20 MG tablet Take 2 tablets (40 mg total) by mouth daily. 12/12/19  Yes Bacigalupo, Dionne Bucy, MD      VITAL SIGNS:  Blood pressure 115/62, pulse 98, temperature (!) 103.1 F (39.5 C), temperature source Oral, resp. rate 18, height 5\' 3"  (1.6 m), weight 116.6 kg, SpO2 94 %.  PHYSICAL EXAMINATION:  Physical Exam  GENERAL:  59 y.o.-year-old Caucasian female patient lying in the bed with no acute distress.  EYES: Pupils  equal, round, reactive to light and accommodation. No scleral icterus. Extraocular muscles intact.  HEENT: Head atraumatic, normocephalic. Oropharynx and nasopharynx clear.  NECK:  Supple, no jugular venous distention. No thyroid enlargement, no tenderness.  LUNGS: Normal breath sounds bilaterally, no wheezing, rales,rhonchi or crepitation. No use of accessory muscles of respiration.  CARDIOVASCULAR: Regular rate and rhythm, S1, S2 normal. No murmurs, rubs, or gallops.  ABDOMEN: Soft, nondistended, nontender. Bowel sounds present. No organomegaly or mass.  EXTREMITIES: Bilateral lower extremity 1+ pitting edema with no cyanosis, or clubbing.  She has left leg erythema with mild tenderness. NEUROLOGIC: Cranial nerves II through XII are intact. Muscle strength 5/5 in all extremities. Sensation intact. Gait not checked.  PSYCHIATRIC: The patient is alert and oriented x 3.  Normal affect and good eye contact. SKIN: Left leg erythema with associated mild induration, tenderness and warmth over the anterior leg as well as anteromedial and anterolateral portal.    LABORATORY PANEL:   CBC Recent Labs  Lab 12/23/19 1656  WBC 19.6*  HGB 15.3*  HCT 43.6  PLT 229    ------------------------------------------------------------------------------------------------------------------  Chemistries  Recent Labs  Lab 12/23/19 1656  NA 137  K 3.2*  CL 103  CO2 22  GLUCOSE 127*  BUN 24*  CREATININE 1.40*  CALCIUM 8.7*  AST 28  ALT 17  ALKPHOS 59  BILITOT 0.8   ------------------------------------------------------------------------------------------------------------------  Cardiac Enzymes No results for input(s): TROPONINI in the last 168 hours. ------------------------------------------------------------------------------------------------------------------  RADIOLOGY:  DG Chest 2 View  Result Date: 12/23/2019 CLINICAL DATA:  Fever EXAM: CHEST - 2 VIEW COMPARISON:  Chest radiograph dated 06/05/2012 FINDINGS: The heart size and mediastinal contours are within normal limits. Both lungs are clear. There is a thoracic dextrocurvature of the spine. IMPRESSION: No active cardiopulmonary disease. Electronically Signed   By: Zerita Boers M.D.   On: 12/23/2019 17:28    IMPRESSION AND PLAN:   1.  Sepsis likely secondary to acute moderate nonpurulent left lower extremity cellulitis. -The patient will be admitted to a progressive unit bed. -We will continue antibiotic therapy with 2 g of IV Rocephin. -She will have warm compresses every shift. -We will follow blood cultures. -Pain management will be provided.  2.  Depression and anxiety. -We will continue Wellbutrin and Lexapro.  3.  Migraine headaches. -We will continue as needed Fioricet and Maxalt as well as Topamax ER..  4.  GERD. -PPI therapy will be resumed.  5.  DVT prophylaxis. -Subcutaneous Lovenox.   All the records are reviewed and case discussed with ED provider. The plan of care was discussed in details with the patient (and family). I answered all questions. The patient agreed to proceed with the above mentioned plan. Further management will depend upon hospital  course.   CODE STATUS: Full code  Status is: Inpatient  Remains inpatient appropriate because:Ongoing active pain requiring inpatient pain management, Ongoing diagnostic testing needed not appropriate for outpatient work up, Unsafe d/c plan, IV treatments appropriate due to intensity of illness or inability to take PO and Inpatient level of care appropriate due to severity of illness   Dispo: The patient is from: Home              Anticipated d/c is to: Home              Anticipated d/c date is: 2 days              Patient currently is not medically stable to d/c.   TOTAL TIME  TAKING CARE OF THIS PATIENT: 55 minutes.    Christel Mormon M.D on 12/23/2019 at 11:03 PM  [facility]   From 7 PM-7 AM, contact night-coverage [email or web address]  CC: Primary care physician; Virginia Crews, MD   Note: This dictation was prepared with Dragon dictation along with smaller phrase technology. Any transcriptional typo errors that result from this process are unintentional.

## 2019-12-23 NOTE — ED Triage Notes (Signed)
First RN Note: Pt presents to ED via [place] with husband. Pt's husband reports pt had 1st dose of pfizer last week. States this morning patient was baseline, this afternoon started running fever of 99, went to drugstore and when he came back patient had fever of 102. Pt's husband reports "just declined". Pt's husband reports given patient water and electrolytes PTA to keep her hydrated.

## 2019-12-23 NOTE — Progress Notes (Signed)
Pharmacy Antibiotic Note  Rachael Brewer is a 59 y.o. female admitted on 12/23/2019 with fever of unknown origin meeting 2/4 SIRS w/ elevated lacatate..  Pharmacy has been consulted for vanc/cefepime dosing. Patient is receiving vanc 2g IV load and cefepime 2g IV x 1 in the ED.  Plan: Patient does not have a h/o CKD; however, is in AKI w/ a current SCr of 1.4 (baseline 0.9).  As a result will dose per random levels and will check a random vanc level at 07/05 at 1100 ~ 12 hours post load. Will continue cefepime 2g IV q12h per CrCl 30 - 60 ml/min and flagyl 500 mg IV q8h and will continue to monitor and adjust doses per renal function for vanc/cefepime.  Goal random level < 20 mcg/mL.  Height: 5\' 3"  (160 cm) Weight: 116.6 kg (257 lb) IBW/kg (Calculated) : 52.4  Temp (24hrs), Avg:103.1 F (39.5 C), Min:103.1 F (39.5 C), Max:103.1 F (39.5 C)  Recent Labs  Lab 12/19/19 0931 12/23/19 1656 12/23/19 2034  WBC  --  19.6*  --   CREATININE 1.16* 1.40*  --   LATICACIDVEN  --  2.7* 2.9*    Estimated Creatinine Clearance: 52.7 mL/min (A) (by C-G formula based on SCr of 1.4 mg/dL (H)).    Allergies  Allergen Reactions   Bactrim [Sulfamethoxazole-Trimethoprim] Shortness Of Breath and Swelling   Sulfa Antibiotics Anaphylaxis   Sumatriptan     Made migraine more intense and scalp felt like it was on fire   Penicillins Hives    Did it involve swelling of the face/tongue/throat, SOB, or low BP? No Did it involve sudden or severe rash/hives, skin peeling, or any reaction on the inside of your mouth or nose? No Did you need to seek medical attention at a hospital or doctor's office? No When did it last happen?10 years ago If all above answers are "NO", may proceed with cephalosporin use.     Thank you for allowing pharmacy to be a part of this patients care.  Tobie Lords, PharmD, BCPS Clinical Pharmacist 12/23/2019 11:37 PM

## 2019-12-23 NOTE — ED Notes (Signed)
Verbal order given by Dr. Cherylann Banas for reglan 10mg  IV stat for pt c/o migraine

## 2019-12-23 NOTE — ED Notes (Signed)
Admitting MD at bedside.

## 2019-12-24 DIAGNOSIS — F339 Major depressive disorder, recurrent, unspecified: Secondary | ICD-10-CM

## 2019-12-24 DIAGNOSIS — L03116 Cellulitis of left lower limb: Secondary | ICD-10-CM

## 2019-12-24 DIAGNOSIS — G43709 Chronic migraine without aura, not intractable, without status migrainosus: Secondary | ICD-10-CM

## 2019-12-24 DIAGNOSIS — I872 Venous insufficiency (chronic) (peripheral): Secondary | ICD-10-CM

## 2019-12-24 DIAGNOSIS — I890 Lymphedema, not elsewhere classified: Secondary | ICD-10-CM

## 2019-12-24 LAB — VANCOMYCIN, RANDOM: Vancomycin Rm: 19

## 2019-12-24 LAB — CORTISOL-AM, BLOOD: Cortisol - AM: 8.3 ug/dL (ref 6.7–22.6)

## 2019-12-24 LAB — LACTIC ACID, PLASMA
Lactic Acid, Venous: 2.2 mmol/L (ref 0.5–1.9)
Lactic Acid, Venous: 1.6 mmol/L (ref 0.5–1.9)

## 2019-12-24 LAB — CBC
HCT: 40.5 % (ref 36.0–46.0)
Hemoglobin: 13.6 g/dL (ref 12.0–15.0)
MCH: 30.6 pg (ref 26.0–34.0)
MCHC: 33.6 g/dL (ref 30.0–36.0)
MCV: 91.2 fL (ref 80.0–100.0)
Platelets: 225 10*3/uL (ref 150–400)
RBC: 4.44 MIL/uL (ref 3.87–5.11)
RDW: 13.8 % (ref 11.5–15.5)
WBC: 26 10*3/uL — ABNORMAL HIGH (ref 4.0–10.5)
nRBC: 0 % (ref 0.0–0.2)

## 2019-12-24 LAB — PROTIME-INR
INR: 1.2 (ref 0.8–1.2)
Prothrombin Time: 15 seconds (ref 11.4–15.2)

## 2019-12-24 LAB — BASIC METABOLIC PANEL
Anion gap: 9 (ref 5–15)
BUN: 22 mg/dL — ABNORMAL HIGH (ref 6–20)
CO2: 23 mmol/L (ref 22–32)
Calcium: 8.1 mg/dL — ABNORMAL LOW (ref 8.9–10.3)
Chloride: 105 mmol/L (ref 98–111)
Creatinine, Ser: 1.18 mg/dL — ABNORMAL HIGH (ref 0.44–1.00)
GFR calc Af Amer: 58 mL/min — ABNORMAL LOW (ref >60)
GFR calc non Af Amer: 50 mL/min — ABNORMAL LOW (ref >60)
Glucose, Bld: 114 mg/dL — ABNORMAL HIGH (ref 70–99)
Potassium: 3.2 mmol/L — ABNORMAL LOW (ref 3.5–5.1)
Sodium: 137 mmol/L (ref 135–145)

## 2019-12-24 LAB — MRSA PCR SCREENING: MRSA by PCR: POSITIVE — AB

## 2019-12-24 LAB — PROCALCITONIN: Procalcitonin: 28.65 ng/mL

## 2019-12-24 MED ORDER — MUPIROCIN 2 % EX OINT
1.0000 "application " | TOPICAL_OINTMENT | Freq: Two times a day (BID) | CUTANEOUS | Status: DC
  Administered 2019-12-24 – 2019-12-26 (×3): 1 via NASAL
  Filled 2019-12-24 (×2): qty 22

## 2019-12-24 MED ORDER — CHLORHEXIDINE GLUCONATE CLOTH 2 % EX PADS
6.0000 | MEDICATED_PAD | Freq: Every day | CUTANEOUS | Status: DC
  Administered 2019-12-25: 6 via TOPICAL

## 2019-12-24 MED ORDER — VANCOMYCIN HCL 750 MG/150ML IV SOLN
750.0000 mg | Freq: Two times a day (BID) | INTRAVENOUS | Status: DC
  Administered 2019-12-24: 750 mg via INTRAVENOUS
  Filled 2019-12-24 (×2): qty 150

## 2019-12-24 MED ORDER — SODIUM CHLORIDE 0.9 % IV SOLN
2.0000 g | INTRAVENOUS | Status: DC
  Administered 2019-12-24: 2 g via INTRAVENOUS
  Filled 2019-12-24: qty 2

## 2019-12-24 MED ORDER — CEFAZOLIN SODIUM-DEXTROSE 2-4 GM/100ML-% IV SOLN
2.0000 g | Freq: Three times a day (TID) | INTRAVENOUS | Status: DC
  Administered 2019-12-25: 2 g via INTRAVENOUS
  Filled 2019-12-24 (×3): qty 100

## 2019-12-24 MED ORDER — VANCOMYCIN HCL IN DEXTROSE 1-5 GM/200ML-% IV SOLN
1000.0000 mg | Freq: Two times a day (BID) | INTRAVENOUS | Status: DC
  Filled 2019-12-24 (×2): qty 200

## 2019-12-24 MED ORDER — VANCOMYCIN HCL IN DEXTROSE 750-5 MG/150ML-% IV SOLN
750.0000 mg | Freq: Two times a day (BID) | INTRAVENOUS | Status: DC
  Administered 2019-12-25 – 2019-12-26 (×3): 750 mg via INTRAVENOUS
  Filled 2019-12-24 (×5): qty 150

## 2019-12-24 NOTE — Progress Notes (Signed)
Pharmacy Antibiotic Note  Rachael Brewer is a 59 y.o. female admitted on 12/23/2019 with fever of unknown origin meeting 2/4 SIRS w/ elevated lacatate..    Pharmacy has been consulted for vanc dosing.   Patient is receiving vanc 2g IV load in the ED and aki appears to be resolving  Scr 1.40>1.18  Plan: Will dose per nomogram and re-evaluate with am labs  Vancomycin 750mg  IV q12h  Height: 5\' 3"  (160 cm) Weight: 116.8 kg (257 lb 6.4 oz) IBW/kg (Calculated) : 52.4  Temp (24hrs), Avg:99.2 F (37.3 C), Min:97.7 F (36.5 C), Max:100.4 F (38 C)  Recent Labs  Lab 12/19/19 0931 12/23/19 1656 12/23/19 2034 12/24/19 0024 12/24/19 0238 12/24/19 0457 12/24/19 0504  WBC  --  19.6*  --   --   --  26.0*  --   CREATININE 1.16* 1.40*  --   --   --  1.18*  --   LATICACIDVEN  --  2.7* 2.9* 2.2* 1.6  --   --   VANCORANDOM  --   --   --   --   --   --  19    Estimated Creatinine Clearance: 62.6 mL/min (A) (by C-G formula based on SCr of 1.18 mg/dL (H)).    Allergies  Allergen Reactions   Bactrim [Sulfamethoxazole-Trimethoprim] Shortness Of Breath and Swelling   Sulfa Antibiotics Anaphylaxis   Sumatriptan     Made migraine more intense and scalp felt like it was on fire   Penicillins Hives    Did it involve swelling of the face/tongue/throat, SOB, or low BP? No Did it involve sudden or severe rash/hives, skin peeling, or any reaction on the inside of your mouth or nose? No Did you need to seek medical attention at a hospital or doctor's office? No When did it last happen?10 years ago If all above answers are "NO", may proceed with cephalosporin use.     Thank you for allowing pharmacy to be a part of this patients care.  Lu Duffel, PharmD, BCPS Clinical Pharmacist 12/24/2019 7:55 PM

## 2019-12-24 NOTE — Progress Notes (Addendum)
PROGRESS NOTE  Rachael Brewer ZCH:[id number] DOB: [birth year] DOA: 12/23/2019 PCP: Virginia Crews, MD   LOS: 1 day   Brief narrative: As per HPI,  Rachael Brewer  is a 59 y.o. Caucasian  female with a known history of anxiety/depression, and GERD, migraine, sleep apnea on CPAP, who presented to the emergency room with acute onset of fever, chills and body aches as well as malaise.    She also had nausea and headache.  In the emergency room, temperature was 103.1 with otherwise normal vital signs.  Labs revealed hypokalemia 3.2 and a BUN of 24 with creatinine 1.4 compared to 24/1.16 on 6/30.  Lactic acid was 2.7.  CBC showed leukocytosis of 19.6 with neutrophilia.  COVID-19 PCR came back negative.  UA was unremarkable.  Two-view chest x-ray showed no acute cardiopulmonary disease. The patient was given Tylenol 650 mg p.o. as well as 10 mg of IV Reglan, 3 L bolus of IV normal saline as well as IV vancomycin and Zosyn.  She was admitted to a PCU bed for further evaluation and management.  Assessment/Plan:  Principal Problem:   Sepsis ([place]) Active Problems:   Depression, recurrent (HCC)   Headache, migraine   Morbid obesity ([place])   Venous insufficiency   Lymphedema   Left leg cellulitis   Sepsis likely secondary to acute moderate nonpurulent left lower extremity cellulitis. Patient presented with signs of sepsis including fever, leukocytosis, neutrophilia, elevated lactate with possible source of infection and cellulitis.  Follow blood cultures.  Continue on iv Rocephin.  Lactate has improved with IV fluid hydration.  Lactic of 1.6 at this time.  History of lymphedema and venous insufficiency in the legs.  Advised leg elevation and compression stockings at home.  Temperature max of 103.1 F.  Mild hypokalemia.  Has been replenished.  Check levels in a.m. potassium 3.2  Depression and anxiety. On Wellbutrin and Lexapro.  Migraine headaches. As needed Fioricet and Maxalt Topamax  ER..  GERD. Continue PPI  Morbid obesity. Patient would benefit from outpatient counseling and weight reduction.  DVT prophylaxis: enoxaparin (LOVENOX) injection 40 mg Start: 12/23/19 2345  Code Status: Full code  Family Communication: I was unable to reach the patient's husband on the phone today.  Status is: Inpatient  Remains inpatient appropriate because:IV treatments appropriate due to intensity of illness or inability to take PO and Inpatient level of care appropriate due to severity of illness   Dispo: The patient is from: Home              Anticipated d/c is to: Home              Anticipated d/c date is: 2 days              Patient currently is not medically stable to d/c.  Consultants:  None  Procedures:  None  Antibiotics:  . Rocephin IV  Anti-infectives (From admission, onward)   Start     Dose/Rate Route Frequency Ordered Stop   12/24/19 1000  ceFEPIme (MAXIPIME) 2 g in sodium chloride 0.9 % 100 mL IVPB  Status:  Discontinued        2 g 200 mL/hr over 30 Minutes Intravenous Every 12 hours 12/23/19 2303 12/24/19 0136   12/24/19 0600  cefTRIAXone (ROCEPHIN) 2 g in sodium chloride 0.9 % 100 mL IVPB     Discontinue     2 g 200 mL/hr over 30 Minutes Intravenous Every 24 hours 12/24/19 0136     12/23/19  2315  metroNIDAZOLE (FLAGYL) IVPB 500 mg  Status:  Discontinued        500 mg 100 mL/hr over 60 Minutes Intravenous Every 8 hours 12/23/19 2303 12/24/19 0136   12/23/19 2315  vancomycin (VANCOCIN) IVPB 1000 mg/200 mL premix  Status:  Discontinued        1,000 mg 200 mL/hr over 60 Minutes Intravenous  Once 12/23/19 2303 12/23/19 2322   12/23/19 2230  vancomycin (VANCOCIN) IVPB 1000 mg/200 mL premix        1,000 mg 200 mL/hr over 60 Minutes Intravenous  Once 12/23/19 2219 12/24/19 0023   12/23/19 2200  cefTRIAXone (ROCEPHIN) 1 g in sodium chloride 0.9 % 100 mL IVPB  Status:  Discontinued        1 g 200 mL/hr over 30 Minutes Intravenous Every 24 hours  12/23/19 2149 12/23/19 2151   12/23/19 2200  ceFEPIme (MAXIPIME) 2 g in sodium chloride 0.9 % 100 mL IVPB        2 g 200 mL/hr over 30 Minutes Intravenous  Once 12/23/19 2150 12/23/19 2320   12/23/19 2200  vancomycin (VANCOCIN) IVPB 1000 mg/200 mL premix  Status:  Discontinued        1,000 mg 200 mL/hr over 60 Minutes Intravenous  Once 12/23/19 2150 12/24/19 0313     Subjective: Today, patient was seen and examined at bedside.  Patient feels little better with the breathing.  Denies overt pain on the legs but has mild erythema.  Complaints of left groin pain.  Had a fever of 103.1 yesterday evening.  Objective: Vitals:   12/24/19 0126 12/24/19 0512  BP: (!) 115/57 (!) 99/49  Pulse: 87 73  Resp: 20 20  Temp: 100 F (37.8 C) 98.1 F (36.7 C)  SpO2: 96% 95%    Intake/Output Summary (Last 24 hours) at 12/24/2019 0713 Last data filed at 12/24/2019 0535 Gross per 24 hour  Intake 1607.28 ml  Output --  Net 1607.28 ml   Filed Weights   12/23/19 1655 12/24/19 0126  Weight: 116.6 kg 116.8 kg   Body mass index is 45.6 kg/m.   Physical Exam: GENERAL: Patient is alert awake and oriented. Not in obvious distress.  Morbidly obese HENT: No scleral pallor or icterus. Pupils equally reactive to light. Oral mucosa is moist NECK: is supple, no gross swelling noted. CHEST: Diminished breath sounds bilateral. CVS: S1 and S2 heard, no murmur. Regular rate and rhythm.  ABDOMEN: Soft, non-tender, bowel sounds are present. EXTREMITIES: Bilateral lower extremity 1+ edema.  Left leg erythema induration and tenderness on palpation. CNS: Cranial nerves are intact. No focal motor deficits. SKIN: warm and dry , left leg cellulitis  Data Review: I have personally reviewed the following laboratory data and studies,  CBC: Recent Labs  Lab 12/23/19 1656 12/24/19 0457  WBC 19.6* 26.0*  NEUTROABS 17.4*  --   HGB 15.3* 13.6  HCT 43.6 40.5  MCV 88.8 91.2  PLT 229 956   Basic Metabolic  Panel: Recent Labs  Lab 12/19/19 0931 12/23/19 1656 12/24/19 0457  NA 139 137 137  K 3.8 3.2* 3.2*  CL 103 103 105  CO2 23 22 23   GLUCOSE 114* 127* 114*  BUN 24 24* 22*  CREATININE 1.16* 1.40* 1.18*  CALCIUM 9.3 8.7* 8.1*   Liver Function Tests: Recent Labs  Lab 12/19/19 0931 12/23/19 1656  AST 20 28  ALT 14 17  ALKPHOS 78 59  BILITOT 0.5 0.8  PROT 6.9 6.9  ALBUMIN 4.4 4.1  No results for input(s): LIPASE, AMYLASE in the last 168 hours. No results for input(s): AMMONIA in the last 168 hours. Cardiac Enzymes: No results for input(s): CKTOTAL, CKMB, CKMBINDEX, TROPONINI in the last 168 hours. BNP (last 3 results) No results for input(s): BNP in the last 8760 hours.  ProBNP (last 3 results) No results for input(s): PROBNP in the last 8760 hours.  CBG: No results for input(s): GLUCAP in the last 168 hours. Recent Results (from the past 240 hour(s))  SARS Coronavirus 2 by RT PCR (hospital order, performed in [facility] hospital lab) Nasopharyngeal Nasopharyngeal Swab     Status: None   Collection Time: 12/23/19  6:53 PM   Specimen: Nasopharyngeal Swab  Result Value Ref Range Status   SARS Coronavirus 2 NEGATIVE NEGATIVE Final    Comment: (NOTE) SARS-CoV-2 target nucleic acids are NOT DETECTED.  The SARS-CoV-2 RNA is generally detectable in upper and lower respiratory specimens during the acute phase of infection. The lowest concentration of SARS-CoV-2 viral copies this assay can detect is 250 copies / mL. A negative result does not preclude SARS-CoV-2 infection and should not be used as the sole basis for treatment or other patient management decisions.  A negative result may occur with improper specimen collection / handling, submission of specimen other than nasopharyngeal swab, presence of viral mutation(s) within the areas targeted by this assay, and inadequate number of viral copies (<250 copies / mL). A negative result must be combined with  clinical observations, patient history, and epidemiological information.  Fact Sheet for Patients:   StrictlyIdeas.no  Fact Sheet for Healthcare Providers: BankingDealers.co.za  This test is not yet approved or  cleared by the [place] FDA and has been authorized for detection and/or diagnosis of SARS-CoV-2 by FDA under an Emergency Use Authorization (EUA).  This EUA will remain in effect (meaning this test can be used) for the duration of the COVID-19 declaration under Section 564(b)(1) of the Act, 21 U.S.C. section 360bbb-3(b)(1), unless the authorization is terminated or revoked sooner.  Performed at [facility], [address] [place], [place] [address]      Studies: DG Chest 2 View  Result Date: 12/23/2019 CLINICAL DATA:  Fever EXAM: CHEST - 2 VIEW COMPARISON:  Chest radiograph dated 06/05/2012 FINDINGS: The heart size and mediastinal contours are within normal limits. Both lungs are clear. There is a thoracic dextrocurvature of the spine. IMPRESSION: No active cardiopulmonary disease. Electronically Signed   By: Zerita Boers M.D.   On: 12/23/2019 17:28      Flora Lipps, MD  [facility] 12/24/2019

## 2019-12-24 NOTE — Progress Notes (Signed)
CODE SEPSIS - PHARMACY COMMUNICATION  **Broad Spectrum Antibiotics should be administered within 1 hour of Sepsis diagnosis**  Time Code Sepsis Called/Page Received: 2258  Antibiotics Ordered: vanc/cefepime/flagyl  Time of 1st antibiotic administration: 2232  Additional action taken by pharmacy:   If necessary, Name of Provider/Nurse Contacted:     Tobie Lords ,PharmD Clinical Pharmacist  12/24/2019  1:03 AM

## 2019-12-24 NOTE — ED Notes (Signed)
Admitting MD at bedside.

## 2019-12-25 LAB — CBC WITH DIFFERENTIAL/PLATELET
Abs Immature Granulocytes: 0.09 10*3/uL — ABNORMAL HIGH (ref 0.00–0.07)
Basophils Absolute: 0.1 10*3/uL (ref 0.0–0.1)
Basophils Relative: 1 %
Eosinophils Absolute: 0.2 10*3/uL (ref 0.0–0.5)
Eosinophils Relative: 1 %
HCT: 36.1 % (ref 36.0–46.0)
Hemoglobin: 12.1 g/dL (ref 12.0–15.0)
Immature Granulocytes: 1 %
Lymphocytes Relative: 16 %
Lymphs Abs: 2.3 10*3/uL (ref 0.7–4.0)
MCH: 30.5 pg (ref 26.0–34.0)
MCHC: 33.5 g/dL (ref 30.0–36.0)
MCV: 90.9 fL (ref 80.0–100.0)
Monocytes Absolute: 1.1 10*3/uL — ABNORMAL HIGH (ref 0.1–1.0)
Monocytes Relative: 7 %
Neutro Abs: 10.6 10*3/uL — ABNORMAL HIGH (ref 1.7–7.7)
Neutrophils Relative %: 74 %
Platelets: 190 10*3/uL (ref 150–400)
RBC: 3.97 MIL/uL (ref 3.87–5.11)
RDW: 14 % (ref 11.5–15.5)
WBC: 14.3 10*3/uL — ABNORMAL HIGH (ref 4.0–10.5)
nRBC: 0 % (ref 0.0–0.2)

## 2019-12-25 LAB — BASIC METABOLIC PANEL
Anion gap: 9 (ref 5–15)
BUN: 20 mg/dL (ref 6–20)
CO2: 23 mmol/L (ref 22–32)
Calcium: 8 mg/dL — ABNORMAL LOW (ref 8.9–10.3)
Chloride: 107 mmol/L (ref 98–111)
Creatinine, Ser: 1.06 mg/dL — ABNORMAL HIGH (ref 0.44–1.00)
GFR calc Af Amer: >60 mL/min (ref >60)
GFR calc non Af Amer: 57 mL/min — ABNORMAL LOW (ref >60)
Glucose, Bld: 116 mg/dL — ABNORMAL HIGH (ref 70–99)
Potassium: 3.2 mmol/L — ABNORMAL LOW (ref 3.5–5.1)
Sodium: 139 mmol/L (ref 135–145)

## 2019-12-25 LAB — MAGNESIUM: Magnesium: 1.9 mg/dL (ref 1.7–2.4)

## 2019-12-25 LAB — PHOSPHORUS: Phosphorus: 2.6 mg/dL (ref 2.5–4.6)

## 2019-12-25 MED ORDER — POTASSIUM CHLORIDE 20 MEQ/15ML (10%) PO SOLN
40.0000 meq | Freq: Every day | ORAL | Status: DC
  Administered 2019-12-25 – 2019-12-26 (×2): 40 meq via ORAL
  Filled 2019-12-25 (×2): qty 30

## 2019-12-25 NOTE — Progress Notes (Addendum)
PROGRESS NOTE  KALA AMBRIZ WNI:[id number] DOB: 08-30-1960 DOA: 12/23/2019 PCP: Virginia Crews, MD   LOS: 2 days   Brief narrative: As per HPI,  Rachael Brewer  is a 59 y.o. Caucasian  female with a known history of anxiety/depression, and GERD, migraine, sleep apnea on CPAP, who presented to the emergency room with acute onset of fever, chills and body aches as well as malaise.    She also had nausea and headache.  In the emergency room, temperature was 103.1 with otherwise normal vital signs.  Labs revealed hypokalemia 3.2 and a BUN of 24 with creatinine 1.4 compared to 24/1.16 on 6/30.  Lactic acid was 2.7.  CBC showed leukocytosis of 19.6 with neutrophilia.  COVID-19 PCR came back negative.  UA was unremarkable.  Two-view chest x-ray showed no acute cardiopulmonary disease. The patient was given Tylenol 650 mg p.o. as well as 10 mg of IV Reglan, 3 L bolus of IV normal saline as well as IV vancomycin and Zosyn.  She was admitted to a PCU bed for further evaluation and management.  Assessment/Plan:  Principal Problem:   Sepsis ([place]) Active Problems:   Depression, recurrent (HCC)   Headache, migraine   Morbid obesity ([place])   Venous insufficiency   Lymphedema   Left leg cellulitis   Sepsis likely secondary to acute moderate nonpurulent left lower extremity cellulitis.  Patient presented with signs of sepsis including fever, leukocytosis, neutrophilia, elevated lactate with possible source of infection and cellulitis.  Blood cultures negative so far but MRSA nasal screen positive.   Lactate has improved with IV fluid hydration.  Lactic of 1.6 at this time.  History of lymphedema and venous insufficiency in the legs.  Advised leg elevation and compression stockings at home.  Temperature max of 103.1 F.  Leukocytosis trending down from 26,000 to 14,000 today.  T-max of 99.1 F.  Mild hypokalemia.  Continue to replenish.  Potassium 3.2 today.  Check BMP in a.m.  Depression and  anxiety. On Wellbutrin and Lexapro.  Migraine headaches. As needed Fioricet and Maxalt, Topamax ER..  GERD. Continue PPI  Morbid obesity. Patient would benefit from outpatient counseling and weight reduction.  DVT prophylaxis: enoxaparin (LOVENOX) injection 40 mg Start: 12/23/19 2345  Code Status: Full code  Family Communication: None today.  Status is: Inpatient  Remains inpatient appropriate because:IV treatments appropriate due to intensity of illness or inability to take PO and Inpatient level of care appropriate due to severity of illness, IV vancomycin   Dispo: The patient is from: Home              Anticipated d/c is to: Home              Anticipated d/c date is: 1 to 2 days.  Likely tomorrow if the erythema and cellulitis continues to improve.              Patient currently is not medically stable to d/c.  Consultants:  None  Procedures:  None  Antibiotics:  Vancomycin IV  Subjective:  Today, patient was seen and examined at bedside.  Complains of mild leg pain.  Had chills but no diaphoresis.  Feels better with breathing.  No nausea vomiting.  Groin pain and redness has improved.  Objective: Vitals:   12/25/19 0724 12/25/19 1111  BP: (!) 121/51 114/70  Pulse: 71 64  Resp: 17 17  Temp: 98.6 F (37 C) 99.1 F (37.3 C)  SpO2: 96% 97%    Intake/Output Summary (  Last 24 hours) at 12/25/2019 1231 Last data filed at 12/25/2019 1117 Gross per 24 hour  Intake 3119.82 ml  Output 2200 ml  Net 919.82 ml   Filed Weights   12/23/19 1655 12/24/19 0126 12/25/19 0437  Weight: 116.6 kg 116.8 kg 116.8 kg   Body mass index is 45.63 kg/m.   Physical Exam: GENERAL: Patient is alert awake and oriented. Not in obvious distress.  Morbidly obese HENT: No scleral pallor or icterus. Pupils equally reactive to light. Oral mucosa is moist NECK: is supple, no gross swelling noted. CHEST: Diminished breath sounds bilaterally.  No gross wheezes. CVS: S1 and S2 heard,  no murmur. Regular rate and rhythm.  ABDOMEN: Soft, non-tender, bowel sounds are present. EXTREMITIES: Bilateral lower extremity 1+ edema.  Left leg erythema, induration and tenderness on palpation.  Erythema has improved. CNS: Cranial nerves are intact. No focal motor deficits. SKIN: warm and dry , left leg cellulitis  7/4   7/6    Data Review: I have personally reviewed the following laboratory data and studies,  CBC: Recent Labs  Lab 12/23/19 1656 12/24/19 0457 12/25/19 0530  WBC 19.6* 26.0* 14.3*  NEUTROABS 17.4*  --  10.6*  HGB 15.3* 13.6 12.1  HCT 43.6 40.5 36.1  MCV 88.8 91.2 90.9  PLT 229 225 160   Basic Metabolic Panel: Recent Labs  Lab 12/19/19 0931 12/23/19 1656 12/24/19 0457 12/25/19 0530  NA 139 137 137 139  K 3.8 3.2* 3.2* 3.2*  CL 103 103 105 107  CO2 23 22 23 23   GLUCOSE 114* 127* 114* 116*  BUN 24 24* 22* 20  CREATININE 1.16* 1.40* 1.18* 1.06*  CALCIUM 9.3 8.7* 8.1* 8.0*  MG  --   --   --  1.9  PHOS  --   --   --  2.6   Liver Function Tests: Recent Labs  Lab 12/19/19 0931 12/23/19 1656  AST 20 28  ALT 14 17  ALKPHOS 78 59  BILITOT 0.5 0.8  PROT 6.9 6.9  ALBUMIN 4.4 4.1   No results for input(s): LIPASE, AMYLASE in the last 168 hours. No results for input(s): AMMONIA in the last 168 hours. Cardiac Enzymes: No results for input(s): CKTOTAL, CKMB, CKMBINDEX, TROPONINI in the last 168 hours. BNP (last 3 results) No results for input(s): BNP in the last 8760 hours.  ProBNP (last 3 results) No results for input(s): PROBNP in the last 8760 hours.  CBG: No results for input(s): GLUCAP in the last 168 hours. Recent Results (from the past 240 hour(s))  Blood Culture (routine x 2)     Status: None (Preliminary result)   Collection Time: 12/23/19  4:58 PM   Specimen: BLOOD  Result Value Ref Range Status   Specimen Description BLOOD RIGHT ANTECUBITAL  Final   Special Requests   Final    BOTTLES DRAWN AEROBIC AND ANAEROBIC Blood Culture  results may not be optimal due to an inadequate volume of blood received in culture bottles   Culture   Final    NO GROWTH 2 DAYS Performed at [facility], [address], [place], [place] [address]    Report Status PENDING  Incomplete  Blood Culture (routine x 2)     Status: None (Preliminary result)   Collection Time: 12/23/19  6:04 PM   Specimen: BLOOD  Result Value Ref Range Status   Specimen Description BLOOD LEFT ANTECUBITAL  Final   Special Requests   Final    BOTTLES DRAWN AEROBIC AND  ANAEROBIC Blood Culture results may not be optimal due to an inadequate volume of blood received in culture bottles   Culture   Final    NO GROWTH 2 DAYS Performed at [facility], [address] [place], [place] [address]    Report Status PENDING  Incomplete  SARS Coronavirus 2 by RT PCR (hospital order, performed in [facility] hospital lab) Nasopharyngeal Nasopharyngeal Swab     Status: None   Collection Time: 12/23/19  6:53 PM   Specimen: Nasopharyngeal Swab  Result Value Ref Range Status   SARS Coronavirus 2 NEGATIVE NEGATIVE Final    Comment: (NOTE) SARS-CoV-2 target nucleic acids are NOT DETECTED.  The SARS-CoV-2 RNA is generally detectable in upper and lower respiratory specimens during the acute phase of infection. The lowest concentration of SARS-CoV-2 viral copies this assay can detect is 250 copies / mL. A negative result does not preclude SARS-CoV-2 infection and should not be used as the sole basis for treatment or other patient management decisions.  A negative result may occur with improper specimen collection / handling, submission of specimen other than nasopharyngeal swab, presence of viral mutation(s) within the areas targeted by this assay, and inadequate number of viral copies (<250 copies / mL). A negative result must be combined with clinical observations, patient history, and epidemiological information.  Fact Sheet for Patients:     StrictlyIdeas.no  Fact Sheet for Healthcare Providers: BankingDealers.co.za  This test is not yet approved or  cleared by the [place] FDA and has been authorized for detection and/or diagnosis of SARS-CoV-2 by FDA under an Emergency Use Authorization (EUA).  This EUA will remain in effect (meaning this test can be used) for the duration of the COVID-19 declaration under Section 564(b)(1) of the Act, 21 U.S.C. section 360bbb-3(b)(1), unless the authorization is terminated or revoked sooner.  Performed at [facility], [address] [place], [place] [address]   MRSA PCR Screening     Status: Abnormal   Collection Time: 12/24/19  2:29 PM   Specimen: Nasal Mucosa; Nasopharyngeal  Result Value Ref Range Status   MRSA by PCR POSITIVE (A) NEGATIVE Final    Comment:        The GeneXpert MRSA Assay (FDA approved for NASAL specimens only), is one component of a comprehensive MRSA colonization surveillance program. It is not intended to diagnose MRSA infection nor to guide or monitor treatment for MRSA infections. RESULT CALLED TO, READ BACK BY AND VERIFIED WITH: [facility] 12/24/19 AT 1608 BY ACR Performed at [facility], [address] [place], [place] [address]      Studies: DG Chest 2 View  Result Date: 12/23/2019 CLINICAL DATA:  Fever EXAM: CHEST - 2 VIEW COMPARISON:  Chest radiograph dated 06/05/2012 FINDINGS: The heart size and mediastinal contours are within normal limits. Both lungs are clear. There is a thoracic dextrocurvature of the spine. IMPRESSION: No active cardiopulmonary disease. Electronically Signed   By: Zerita Boers M.D.   On: 12/23/2019 17:28      Flora Lipps, MD  [facility] 12/25/2019

## 2019-12-25 NOTE — Plan of Care (Signed)
  Problem: Fluid Volume: Goal: Hemodynamic stability will improve Outcome: Progressing   Problem: Pain Managment: Goal: General experience of comfort will improve Outcome: Progressing

## 2019-12-26 LAB — CBC WITH DIFFERENTIAL/PLATELET
Abs Immature Granulocytes: 0.09 10*3/uL — ABNORMAL HIGH (ref 0.00–0.07)
Basophils Absolute: 0.1 10*3/uL (ref 0.0–0.1)
Basophils Relative: 1 %
Eosinophils Absolute: 0.3 10*3/uL (ref 0.0–0.5)
Eosinophils Relative: 3 %
HCT: 37.5 % (ref 36.0–46.0)
Hemoglobin: 12.7 g/dL (ref 12.0–15.0)
Immature Granulocytes: 1 %
Lymphocytes Relative: 32 %
Lymphs Abs: 2.9 10*3/uL (ref 0.7–4.0)
MCH: 30.8 pg (ref 26.0–34.0)
MCHC: 33.9 g/dL (ref 30.0–36.0)
MCV: 90.8 fL (ref 80.0–100.0)
Monocytes Absolute: 0.9 10*3/uL (ref 0.1–1.0)
Monocytes Relative: 9 %
Neutro Abs: 5.1 10*3/uL (ref 1.7–7.7)
Neutrophils Relative %: 54 %
Platelets: 211 10*3/uL (ref 150–400)
RBC: 4.13 MIL/uL (ref 3.87–5.11)
RDW: 14 % (ref 11.5–15.5)
WBC: 9.3 10*3/uL (ref 4.0–10.5)
nRBC: 0 % (ref 0.0–0.2)

## 2019-12-26 LAB — BASIC METABOLIC PANEL
Anion gap: 8 (ref 5–15)
BUN: 22 mg/dL — ABNORMAL HIGH (ref 6–20)
CO2: 26 mmol/L (ref 22–32)
Calcium: 8.4 mg/dL — ABNORMAL LOW (ref 8.9–10.3)
Chloride: 106 mmol/L (ref 98–111)
Creatinine, Ser: 0.94 mg/dL (ref 0.44–1.00)
GFR calc Af Amer: >60 mL/min (ref >60)
GFR calc non Af Amer: >60 mL/min (ref >60)
Glucose, Bld: 104 mg/dL — ABNORMAL HIGH (ref 70–99)
Potassium: 3.3 mmol/L — ABNORMAL LOW (ref 3.5–5.1)
Sodium: 140 mmol/L (ref 135–145)

## 2019-12-26 LAB — MAGNESIUM: Magnesium: 2.2 mg/dL (ref 1.7–2.4)

## 2019-12-26 LAB — PHOSPHORUS: Phosphorus: 3.7 mg/dL (ref 2.5–4.6)

## 2019-12-26 MED ORDER — FLUCONAZOLE 150 MG PO TABS: 150.0000 mg | ORAL_TABLET | Freq: Once | ORAL | 2 refills | Status: AC

## 2019-12-26 MED ORDER — BACID PO TABS
2.0000 | ORAL_TABLET | Freq: Three times a day (TID) | ORAL | Status: DC
  Filled 2019-12-26 (×3): qty 2

## 2019-12-26 MED ORDER — BACID PO TABS: 2.0000 | ORAL_TABLET | Freq: Three times a day (TID) | ORAL | 0 refills | Status: AC

## 2019-12-26 MED ORDER — CLINDAMYCIN HCL 150 MG PO CAPS
300.0000 mg | ORAL_CAPSULE | Freq: Three times a day (TID) | ORAL | 0 refills | Status: AC
Start: 2019-12-26 — End: 2020-01-02

## 2019-12-26 NOTE — Progress Notes (Signed)
Transported off unit for discharge via wheelchair

## 2019-12-26 NOTE — Discharge Summary (Signed)
Physician Discharge Summary [facility]    Patient: Rachael Brewer                   Admit date: 12/23/2019   DOB: 01/13/1961             Discharge date:12/26/2019/10:59 AM IWP:[id number]                          PCP: Virginia Crews, MD  Disposition: Home   Recommendations for Outpatient Follow-up:   Follow up: in 1 week  Discharge Condition: Stable   Code Status:   Code Status: Full Code  Diet recommendation: Cardiac diet   Discharge Diagnoses:    Principal Problem:   Sepsis ([place]) Active Problems:   Depression, recurrent ([place])   Headache, migraine   Morbid obesity ([place])   Venous insufficiency   Lymphedema   Left leg cellulitis   History of Present Illness/ Hospital Course Rachael Brewer Summary:   As per HPI,   Rachael Brewer  is a 59 y.o. Caucasian  female with a known history of anxiety/depression, and GERD, migraine, sleep apnea on CPAP, who presented to the emergency room with acute onset of fever, chills and body aches as well as malaise.    She also had nausea and headache.  In the emergency room, temperature was 103.1 with otherwise normal vital signs.  Labs revealed hypokalemia 3.2 and a BUN of 24 with creatinine 1.4 compared to 24/1.16 on 6/30.  Lactic acid was 2.7.  CBC showed leukocytosis of 19.6 with neutrophilia.  COVID-19 PCR came back negative.  UA was unremarkable.  Two-view chest x-ray showed no acute cardiopulmonary disease. The patient was given Tylenol 650 mg p.o. as well as 10 mg of IV Reglan, 3 L bolus of IV normal saline as well as IV vancomycin and Zosyn.  She was admitted to a PCU bed for further evaluation and   Principal Problem:   Sepsis ([place]) Active Problems:   Depression, recurrent ([facility])   Headache, migraine   Morbid obesity ([place])   Venous insufficiency   Lymphedema   Left leg cellulitis     Sepsis likely secondary to acute moderate nonpurulent left lower extremity cellulitis.   Patient presented with signs of sepsis  including fever, leukocytosis, neutrophilia, elevated lactate with possible source of infection and cellulitis.  Blood cultures negative so far but MRSA nasal screen positive.   Lactate has improved with IV fluid hydration.  Lactic of 1.6 at this time.  History of lymphedema and venous insufficiency in the legs.  Advised leg elevation and compression stockings at home.  Temperature max of 103.1 F.  Leukocytosis trending down from 26,000 to 14,000 today.  T-max of 99.1 F.   Mild hypokalemia.  Continue to replenish.  Potassium 3.2 today.  Check BMP in a.m.   Depression and anxiety. On Wellbutrin and Lexapro.   Migraine headaches. As needed Fioricet and Maxalt, Topamax ER..   GERD. Continue PPI   Morbid obesity. Patient would benefit from outpatient counseling and weight reduction.   DVT prophylaxis: enoxaparin (LOVENOX) injection 40 mg Start: 12/23/19 2345   Code Status: Full code   Family Communication: None today.   Status is: Inpatient   Remains inpatient appropriate because:IV treatments appropriate due to intensity of illness or inability to take PO and Inpatient level of care appropriate due to severity of illness, IV vancomycin     Dispo: The patient is from: Home  Anticipated d/c is to: Home              Anticipated d/c date is: 1 to 2 days.  Likely tomorrow if the erythema and cellulitis continues to improve.              Patient currently is not medically stable to d/c.   Consultants: None   Procedures: None   Antibiotics:   Vancomycin IV -till 12/26/2019 P.o. clindamycin initiated 12/27/2019 for 7 more days   Nutritional status:          Discharge Instructions:   Discharge Instructions     Activity as tolerated - No restrictions   Complete by: As directed    Call MD for:  hives   Complete by: As directed    Call MD for:  redness, tenderness, or signs of infection (pain, swelling, redness, odor or green/yellow discharge around incision site)    Complete by: As directed    Call MD for:  temperature >100.4   Complete by: As directed    Diet - low sodium heart healthy   Complete by: As directed    Discharge instructions   Complete by: As directed    Continue current antibiotics, monitor your lower leg closely, follow with your PCP according   Increase activity slowly   Complete by: As directed          Allergies  Allergen Reactions   Bactrim [Sulfamethoxazole-Trimethoprim] Shortness Of Breath and Swelling   Sulfa Antibiotics Anaphylaxis   Sumatriptan     Made migraine more intense and scalp felt like it was on fire   Penicillins Hives    Did it involve swelling of the face/tongue/throat, SOB, or low BP? No Did it involve sudden or severe rash/hives, skin peeling, or any reaction on the inside of your mouth or nose? No Did you need to seek medical attention at a hospital or doctor's office? No When did it last happen?     10 years ago  If all above answers are "NO", may proceed with cephalosporin use.      Procedures /Studies:   DG Chest 2 View  Result Date: 12/23/2019 CLINICAL DATA:  Fever EXAM: CHEST - 2 VIEW COMPARISON:  Chest radiograph dated 06/05/2012 FINDINGS: The heart size and mediastinal contours are within normal limits. Both lungs are clear. There is a thoracic dextrocurvature of the spine. IMPRESSION: No active cardiopulmonary disease. Electronically Signed   By: Zerita Boers M.D.   On: 12/23/2019 17:28    Subjective:   Patient was seen and examined 12/26/2019, 10:59 AM Patient stable today. No acute distress.  No issues overnight Stable for discharge.  Discharge Exam:    Vitals:   12/25/19 1514 12/25/19 1928 12/26/19 0346 12/26/19 0734  BP: 123/69 130/65 (!) 141/78 126/76  Pulse: 72 71 (!) 59 (!) 58  Resp: 18 20 20 18   Temp: 97.8 F (36.6 C) 98.6 F (37 C) 97.7 F (36.5 C) 98.3 F (36.8 C)  TempSrc: Oral Oral Oral Oral  SpO2: 99% 96% 100% 96%  Weight:   116 kg   Height:         General: Pt lying comfortably in bed & appears in no obvious distress. Cardiovascular: S1 & S2 heard, RRR, S1/S2 +. No murmurs, rubs, gallops or clicks. No JVD or pedal edema. Respiratory: Clear to auscultation without wheezing, rhonchi or crackles. No increased work of breathing. Abdominal:  Non-distended, non-tender & soft. No organomegaly or masses appreciated. Normal bowel sounds heard.  CNS: Alert and oriented. No focal deficits. Extremities: no edema, no cyanosis    The results of significant diagnostics from this hospitalization (including imaging, microbiology, ancillary and laboratory) are listed below for reference.      Microbiology:   Recent Results (from the past 240 hour(s))  Blood Culture (routine x 2)     Status: None (Preliminary result)   Collection Time: 12/23/19  4:58 PM   Specimen: BLOOD  Result Value Ref Range Status   Specimen Description BLOOD RIGHT ANTECUBITAL  Final   Special Requests   Final    BOTTLES DRAWN AEROBIC AND ANAEROBIC Blood Culture results may not be optimal due to an inadequate volume of blood received in culture bottles   Culture   Final    NO GROWTH 3 DAYS Performed at [facility], [address], [place], [place] [address]    Report Status PENDING  Incomplete  Blood Culture (routine x 2)     Status: None (Preliminary result)   Collection Time: 12/23/19  6:04 PM   Specimen: BLOOD  Result Value Ref Range Status   Specimen Description BLOOD LEFT ANTECUBITAL  Final   Special Requests   Final    BOTTLES DRAWN AEROBIC AND ANAEROBIC Blood Culture results may not be optimal due to an inadequate volume of blood received in culture bottles   Culture   Final    NO GROWTH 3 DAYS Performed at [facility], [address] [place], [place] [address]    Report Status PENDING  Incomplete  SARS Coronavirus 2 by RT PCR (hospital order, performed in [place] hospital lab) Nasopharyngeal Nasopharyngeal Swab     Status: None    Collection Time: 12/23/19  6:53 PM   Specimen: Nasopharyngeal Swab  Result Value Ref Range Status   SARS Coronavirus 2 NEGATIVE NEGATIVE Final    Comment: (NOTE) SARS-CoV-2 target nucleic acids are NOT DETECTED.  The SARS-CoV-2 RNA is generally detectable in upper and lower respiratory specimens during the acute phase of infection. The lowest concentration of SARS-CoV-2 viral copies this assay can detect is 250 copies / mL. A negative result does not preclude SARS-CoV-2 infection and should not be used as the sole basis for treatment or other patient management decisions.  A negative result may occur with improper specimen collection / handling, submission of specimen other than nasopharyngeal swab, presence of viral mutation(s) within the areas targeted by this assay, and inadequate number of viral copies (<250 copies / mL). A negative result must be combined with clinical observations, patient history, and epidemiological information.  Fact Sheet for Patients:   StrictlyIdeas.no  Fact Sheet for Healthcare Providers: BankingDealers.co.za  This test is not yet approved or  cleared by the [place] FDA and has been authorized for detection and/or diagnosis of SARS-CoV-2 by FDA under an Emergency Use Authorization (EUA).  This EUA will remain in effect (meaning this test can be used) for the duration of the COVID-19 declaration under Section 564(b)(1) of the Act, 21 U.S.C. section 360bbb-3(b)(1), unless the authorization is terminated or revoked sooner.  Performed at [facility], [address] [place], [place] [address]   MRSA PCR Screening     Status: Abnormal   Collection Time: 12/24/19  2:29 PM   Specimen: Nasal Mucosa; Nasopharyngeal  Result Value Ref Range Status   MRSA by PCR POSITIVE (A) NEGATIVE Final    Comment:        The GeneXpert MRSA Assay (FDA approved for NASAL specimens only),  is one component  of a comprehensive MRSA colonization surveillance program. It is not intended to diagnose MRSA infection nor to guide or monitor treatment for MRSA infections. RESULT CALLED TO, READ BACK BY AND VERIFIED WITH: CRYSTAL GROGAN 12/24/19 AT 1608 BY ACR Performed at [facility], [address] [place], [place] [address]      Labs:   CBC: Recent Labs  Lab 12/23/19 1656 12/24/19 0457 12/25/19 0530 12/26/19 0426  WBC 19.6* 26.0* 14.3* 9.3  NEUTROABS 17.4*  --  10.6* 5.1  HGB 15.3* 13.6 12.1 12.7  HCT 43.6 40.5 36.1 37.5  MCV 88.8 91.2 90.9 90.8  PLT 229 225 190 784   Basic Metabolic Panel: Recent Labs  Lab 12/23/19 1656 12/24/19 0457 12/25/19 0530 12/26/19 0426  NA 137 137 139 140  K 3.2* 3.2* 3.2* 3.3*  CL 103 105 107 106  CO2 22 23 23 26   GLUCOSE 127* 114* 116* 104*  BUN 24* 22* 20 22*  CREATININE 1.40* 1.18* 1.06* 0.94  CALCIUM 8.7* 8.1* 8.0* 8.4*  MG  --   --  1.9 2.2  PHOS  --   --  2.6 3.7   Liver Function Tests: Recent Labs  Lab 12/23/19 1656  AST 28  ALT 17  ALKPHOS 59  BILITOT 0.8  PROT 6.9  ALBUMIN 4.1   BNP (last 3 results) No results for input(s): BNP in the last 8760 hours. Cardiac Enzymes: No results for input(s): CKTOTAL, CKMB, CKMBINDEX, TROPONINI in the last 168 hours. CBG: No results for input(s): GLUCAP in the last 168 hours. Hgb A1c No results for input(s): HGBA1C in the last 72 hours. Lipid Profile No results for input(s): CHOL, HDL, LDLCALC, TRIG, CHOLHDL, LDLDIRECT in the last 72 hours. Thyroid function studies No results for input(s): TSH, T4TOTAL, T3FREE, THYROIDAB in the last 72 hours.  Invalid input(s): FREET3 Anemia work up No results for input(s): VITAMINB12, FOLATE, FERRITIN, TIBC, IRON, RETICCTPCT in the last 72 hours. Urinalysis    Component Value Date/Time   COLORURINE YELLOW (A) 12/23/2019 2029   APPEARANCEUR HAZY (A) 12/23/2019 2029   LABSPEC 1.013 12/23/2019 2029   PHURINE 5.0 12/23/2019 2029    GLUCOSEU NEGATIVE 12/23/2019 2029   HGBUR NEGATIVE 12/23/2019 2029   BILIRUBINUR NEGATIVE 12/23/2019 2029   BILIRUBINUR neg 12/15/2015 [place] NEGATIVE 12/23/2019 2029   PROTEINUR NEGATIVE 12/23/2019 2029   UROBILINOGEN 0.2 12/15/2015 1005   NITRITE NEGATIVE 12/23/2019 2029   LEUKOCYTESUR NEGATIVE 12/23/2019 2029         Time coordinating discharge: Over 45 minutes  SIGNED: Deatra James, MD, FACP, FHM. [facility],  Please use [email or web address] to Page If 7PM-7AM, please contact night-coverage [email or web address], Password [facility] 12/26/2019, 10:59 AM

## 2019-12-26 NOTE — Progress Notes (Signed)
IV and tele removed from patient. Discharge instructions given to patient. Verbalized understanding. No acute distress at this time. Patient called husband for transportation.

## 2019-12-27 ENCOUNTER — Telehealth: Payer: Self-pay

## 2019-12-27 NOTE — Telephone Encounter (Signed)
Patient was recently discharged from the hospital on 12/26/19.  No TCM completed, patient does not qualify for TCM services due to [employer] coverage. FYI!

## 2019-12-28 LAB — CULTURE, BLOOD (ROUTINE X 2)
Culture: NO GROWTH
Culture: NO GROWTH

## 2019-12-28 NOTE — Progress Notes (Signed)
She was hospitalized for sepsis / left leg cellulitis  and recently discharged 12/26/19.Discharge summary recommends follow up with PCP or available provider in one week.  No significant  growth of bacteria on second blood culture while hospitalized.

## 2020-01-02 ENCOUNTER — Encounter: Payer: Self-pay | Admitting: Adult Health

## 2020-01-02 ENCOUNTER — Ambulatory Visit (INDEPENDENT_AMBULATORY_CARE_PROVIDER_SITE_OTHER): Payer: BC Managed Care – PPO | Admitting: Adult Health

## 2020-01-02 ENCOUNTER — Other Ambulatory Visit: Payer: Self-pay

## 2020-01-02 VITALS — BP 122/78 | HR 65 | Temp 98.2°F | Resp 16 | Wt 248.2 lb

## 2020-01-02 DIAGNOSIS — R87619 Unspecified abnormal cytological findings in specimens from cervix uteri: Secondary | ICD-10-CM | POA: Diagnosis not present

## 2020-01-02 DIAGNOSIS — A419 Sepsis, unspecified organism: Secondary | ICD-10-CM

## 2020-01-02 DIAGNOSIS — L03116 Cellulitis of left lower limb: Secondary | ICD-10-CM | POA: Diagnosis not present

## 2020-01-02 NOTE — Progress Notes (Signed)
Established patient visit   Patient: Rachael Brewer   DOB: [birth year]   59 y.o. Female  MRN: [id number] Visit Date: 01/02/2020  Today's healthcare provider: Marcille Buffy, FNP   Chief Complaint  Patient presents with   Hospitalization Follow-up   Subjective    HPI  Follow up Hospitalization  Patient was admitted to [facility] on 12/23/2019 and discharged on 12/26/19. She was treated for sepsis likely secondary to acute moderate nonpurulent left lower extremity cellulitis. Treatment for this included antibiotic treatment Clindamycin 150mg . On the last dose of her Clindamycin.  Telephone follow up was done on 12/27/19 She reports good compliance with treatment. She reports this condition is improved.  She reports that she is feeling much better her left lower leg has improved and she has no residual erythema.  Blood cultures were negative in the hospital.  He feels well denies any unusual fatigue or drowsiness. She denies any symptoms of urinary frequency or burning, she denies any urinary symptoms.  Patient denies any other signs of infection. Currently reports that she feels well and much better than she did previously. She denies any known tick bites or insect bites.  She denied any injuries. Patient  denies any fever, body aches,chills, rash, chest pain, shortness of breath, nausea, vomiting, or diarrhea.   Denies dizziness, lightheadedness, pre syncopal or syncopal episodes.   ----------------------------------------------------------------------------------------- -  Patient Active Problem List   Diagnosis Date Noted   Atypical endocervical cells on Pap smear 01/03/2020   Left leg cellulitis 12/24/2019   Sepsis ([place]) 12/23/2019   Vaginal discharge 12/12/2019   Pre-diabetes 12/12/2019   Total knee replacement status 05/02/2019   Lymphedema 05/07/2016   Morbid obesity ([place]) 01/01/2016   Venous insufficiency 01/01/2016   Pes planus of both feet  01/01/2016   Apnea 01/01/2016   AB (asthmatic bronchitis) 06/10/2015   Allergic rhinitis 06/10/2015   Benign paroxysmal positional nystagmus 06/10/2015   Acid reflux 06/10/2015   Lesion of nose 06/10/2015   Low back strain 06/10/2015   Plantar fasciitis 12/02/2014   Posterior tibial tendonitis 12/02/2014   Arthropathia 12/18/2008   Depression, recurrent ([place]) 07/21/2006   Hypercholesteremia 07/21/2006   BP (high blood pressure) 07/21/2006   Headache, migraine 07/21/2006   Past Medical History:  Diagnosis Date   Anxiety    Arthritis    Complication of anesthesia    CAUSES MIGRAINE   Depression    Edema of both lower extremities    GERD (gastroesophageal reflux disease)    Migraines    Pre-diabetes    Sleep apnea    CPAP   Past Surgical History:  Procedure Laterality Date   ANTERIOR CRUCIATE LIGAMENT REPAIR Right 1996   DILATION AND CURETTAGE OF UTERUS  2004   FOOT SURGERY Bilateral    JOINT REPLACEMENT Right    KNEE ARTHROPLASTY Right 05/02/2019   Procedure: COMPUTER ASSISTED TOTAL KNEE ARTHROPLASTY;  Surgeon: Dereck Leep, MD;  Location: [facility] ORS;  Service: Orthopedics;  Laterality: Right;   KNEE ARTHROPLASTY Left 07/09/2019   Procedure: COMPUTER ASSISTED TOTAL KNEE ARTHROPLASTY;  Surgeon: Dereck Leep, MD;  Location: [facility] ORS;  Service: Orthopedics;  Laterality: Left;   KNEE SURGERY Right    MENISCUS REPAIR Bilateral    Social History   Tobacco Use   Smoking status: Former Smoker    Packs/day: 1.00    Years: 15.00    Pack years: 15.00    Quit date: 06/21/1999    Years since quitting: 20.5  Smokeless tobacco: Never Used  Vaping Use   Vaping Use: Never used  Substance Use Topics   Alcohol use: Yes    Alcohol/week: 0.0 standard drinks    Comment: occasional   Drug use: No   Social History   Socioeconomic History   Marital status: Married    Spouse name: Not on file   Number of children: Not on file   Years  of education: Not on file   Highest education level: Not on file  Occupational History   Not on file  Tobacco Use   Smoking status: Former Smoker    Packs/day: 1.00    Years: 15.00    Pack years: 15.00    Quit date: 06/21/1999    Years since quitting: 20.5   Smokeless tobacco: Never Used  Scientific laboratory technician Use: Never used  Substance and Sexual Activity   Alcohol use: Yes    Alcohol/week: 0.0 standard drinks    Comment: occasional   Drug use: No   Sexual activity: Yes    Birth control/protection: None  Other Topics Concern   Not on file  Social History Narrative   Not on file   Social Determinants of Health   Financial Resource Strain:    Difficulty of Paying Living Expenses:   Food Insecurity:    Worried About Charity fundraiser in the Last Year:    Arboriculturist in the Last Year:   Transportation Needs:    Film/video editor (Medical):    Lack of Transportation (Non-Medical):   Physical Activity:    Days of Exercise per Week:    Minutes of Exercise per Session:   Stress:    Feeling of Stress :   Social Connections:    Frequency of Communication with Friends and Family:    Frequency of Social Gatherings with Friends and Family:    Attends Religious Services:    Active Member of Clubs or Organizations:    Attends Music therapist:    Marital Status:   Intimate Partner Violence:    Fear of Current or Ex-Partner:    Emotionally Abused:    Physically Abused:    Sexually Abused:    Family Status  Relation Name Status   Mother  Alive   Father  Deceased   Brother  Alive   Other  (Not Specified)   Programmer, systems  (Not Specified)   Family History  Problem Relation Age of Onset   Hypertension Brother    Diabetes Brother    Diabetes Other    Hypertension Other    Glaucoma Other    Breast cancer Maternal Aunt        50's   Allergies  Allergen Reactions   Bactrim [Sulfamethoxazole-Trimethoprim]  Shortness Of Breath and Swelling   Sulfa Antibiotics Anaphylaxis   Sumatriptan     Made migraine more intense and scalp felt like it was on fire   Penicillins Hives    Did it involve swelling of the face/tongue/throat, SOB, or low BP? No Did it involve sudden or severe rash/hives, skin peeling, or any reaction on the inside of your mouth or nose? No Did you need to seek medical attention at a hospital or doctor's office? No When did it last happen?10 years ago If all above answers are "NO", may proceed with cephalosporin use.        Medications: Outpatient Medications Prior to Visit  Medication Sig   albuterol (VENTOLIN HFA) 108 (90 Base)  MCG/ACT inhaler Inhale 1-2 puffs into the lungs every 6 (six) hours as needed for wheezing or shortness of breath.   aspirin-acetaminophen-caffeine (EXCEDRIN MIGRAINE) 250-250-65 MG tablet Take 2 tablets by mouth every 8 (eight) hours as needed for migraine.    Azelastine HCl 137 MCG/SPRAY SOLN Place 1 spray into both nostrils at bedtime.    baclofen (LIORESAL) 10 MG tablet Take 10 mg by mouth 2 (two) times daily as needed for muscle spasms. LIMIT 2 DAYS PER WEEK   buPROPion (WELLBUTRIN) 100 MG tablet Take 1 tablet (100 mg total) by mouth 2 (two) times daily.   butalbital-acetaminophen-caffeine (FIORICET) 50-325-40 MG tablet Take 1-2 tablets by mouth every 6 (six) hours as needed for headache.   celecoxib (CELEBREX) 200 MG capsule Take 1 capsule (200 mg total) by mouth 2 (two) times daily.   [EXPIRED] clindamycin (CLEOCIN) 150 MG capsule Take 2 capsules (300 mg total) by mouth 3 (three) times daily for 7 days.   escitalopram (LEXAPRO) 20 MG tablet Take 1 tablet (20 mg total) by mouth daily.   gabapentin (NEURONTIN) 600 MG tablet Take 1,200 mg by mouth at bedtime.    lactobacillus acidophilus (BACID) TABS tablet Take 2 tablets by mouth 3 (three) times daily for 10 days.   Multiple Vitamin (MULTIVITAMIN WITH MINERALS) TABS tablet Take  1 tablet by mouth daily.   Potassium Chloride 40 MEQ/15ML (20%) SOLN Take 40 mEq by mouth daily.   rizatriptan (MAXALT) 10 MG tablet Take 10 mg by mouth every 2 (two) hours as needed for migraine (max 2 tablets/24 hrs.).    topiramate ER (QUDEXY XR) 150 MG CS24 sprinkle capsule Take 150 mg by mouth at bedtime.    torsemide (DEMADEX) 20 MG tablet Take 2 tablets (40 mg total) by mouth daily.   fluconazole (DIFLUCAN) 150 MG tablet Take 150 mg by mouth once. (Patient not taking: Reported on 01/02/2020)   No facility-administered medications prior to visit.    Review of Systems  Constitutional: Negative.  Negative for chills, fatigue and fever.  HENT: Negative.   Respiratory: Negative.   Cardiovascular: Negative.   Gastrointestinal: Negative.   Genitourinary: Negative.   Musculoskeletal: Negative.   Skin: Negative.   Neurological: Negative.   Psychiatric/Behavioral: Negative.     Last CBC Lab Results  Component Value Date   WBC 9.3 12/26/2019   HGB 12.7 12/26/2019   HCT 37.5 12/26/2019   MCV 90.8 12/26/2019   MCH 30.8 12/26/2019   RDW 14.0 12/26/2019   PLT 211 94/70/9628   Last metabolic panel Lab Results  Component Value Date   GLUCOSE 104 (H) 12/26/2019   NA 140 12/26/2019   K 3.3 (L) 12/26/2019   CL 106 12/26/2019   CO2 26 12/26/2019   BUN 22 (H) 12/26/2019   CREATININE 0.94 12/26/2019   GFRNONAA >60 12/26/2019   GFRAA >60 12/26/2019   CALCIUM 8.4 (L) 12/26/2019   PHOS 3.7 12/26/2019   PROT 6.9 12/23/2019   ALBUMIN 4.1 12/23/2019   LABGLOB 2.5 12/19/2019   AGRATIO 1.8 12/19/2019   BILITOT 0.8 12/23/2019   ALKPHOS 59 12/23/2019   AST 28 12/23/2019   ALT 17 12/23/2019   ANIONGAP 8 12/26/2019      Objective    BP 122/78    Pulse 65    Temp 98.2 F (36.8 C) (Oral)    Resp 16    Wt 248 lb 3.2 oz (112.6 kg)    LMP  (LMP Unknown)    SpO2 94%  BMI 43.97 kg/m  BP Readings from Last 3 Encounters:  01/02/20 122/78  12/26/19 121/63  12/12/19 110/64   Wt  Readings from Last 3 Encounters:  01/02/20 248 lb 3.2 oz (112.6 kg)  12/26/19 255 lb 11.2 oz (116 kg)  12/12/19 257 lb (116.6 kg)      Physical Exam Constitutional:      General: She is not in acute distress.    Appearance: Normal appearance. She is obese. She is not ill-appearing, toxic-appearing or diaphoretic.     Comments: Patient appers well, not sickly. Speaking in complete sentences. Patient moves on and off of exam table and in room without difficulty. Gait is normal in hall and in room. Patient is oriented to person place time and situation. Patient answers questions appropriately and engages eye contact and verbal dialect with provider.   HENT:     Head: Normocephalic and atraumatic.     Right Ear: There is no impacted cerumen.     Left Ear: There is no impacted cerumen.     Nose: No congestion or rhinorrhea.     Mouth/Throat:     Mouth: Mucous membranes are moist.  Eyes:     Conjunctiva/sclera: Conjunctivae normal.  Cardiovascular:     Rate and Rhythm: Normal rate and regular rhythm.     Pulses:          Dorsalis pedis pulses are 2+ on the right side and 2+ on the left side.       Posterior tibial pulses are 2+ on the right side and 2+ on the left side.     Heart sounds: Normal heart sounds.     Comments: Trace bilateral lower edema and feet and ankles. No erythema of left lower leg, no open sores or wounds, no areas of concern.  Negative Homans' sign, patient denies any pain. Pulmonary:     Effort: Pulmonary effort is normal. No respiratory distress.     Breath sounds: Normal breath sounds. No stridor. No wheezing, rhonchi or rales.  Chest:     Chest wall: No tenderness.  Abdominal:     General: There is no distension.     Palpations: Abdomen is soft. There is no mass.     Tenderness: There is no abdominal tenderness. There is no right CVA tenderness, left CVA tenderness, guarding or rebound.     Hernia: No hernia is present.  Musculoskeletal:        General: No  swelling, tenderness, deformity or signs of injury. Normal range of motion.     Right lower leg: Edema present.     Left lower leg: Edema present.  Skin:    General: Skin is warm and dry.     Capillary Refill: Capillary refill takes less than 2 seconds.     Findings: No erythema or rash.  Neurological:     General: No focal deficit present.     Mental Status: She is alert and oriented to person, place, and time.     GCS: GCS eye subscore is 4. GCS verbal subscore is 5. GCS motor subscore is 6.     Cranial Nerves: Cranial nerves are intact.     Sensory: Sensation is intact.     Motor: Motor function is intact.     Coordination: Coordination is intact.     Gait: Gait is intact. Gait normal.     Deep Tendon Reflexes:     Reflex Scores:      Bicep reflexes are 2+ on the  right side and 2+ on the left side.      Patellar reflexes are 2+ on the right side and 2+ on the left side. Psychiatric:        Mood and Affect: Mood normal.        Behavior: Behavior normal.        Thought Content: Thought content normal.        Judgment: Judgment normal.     No results found for any visits on 01/02/20.  Assessment & Plan    The primary encounter diagnosis was Left leg cellulitis. Diagnoses of Sepsis, due to unspecified organism, unspecified whether acute organ dysfunction present [facility]), Morbid obesity ([place]), and Atypical endocervical cells on Pap smear were also pertinent to this visit.   Orders Placed This Encounter  Procedures   CBC with Differential/Platelet   Comprehensive Metabolic Panel (CMET)    Follow up visit for post hospitalization. She is feeling well and has no concerns. Labs ordered she is aware of lab locations since office lab closed this week.   Reviewed previous labs done by Virginia Crews, MD she was also aware she needs to follow up with gynecology fr abnormal PAP smear, she thinks she is going to [facility], She is offered a referral at this visit and declines at  this time. May be interested in [facility] but does not want to go to [facility] she states. She is advised to call office when she decided and we can refer. Red Flags discussed. The patient was given clear instructions to go to ER or return to medical center if any red flags develop, symptoms do not improve, worsen or new problems develop. They verbalized understanding.  Return in about 1 month (around 02/02/2020), or if symptoms worsen or fail to improve, for at any time for any worsening symptoms, Go to Emergency room/ urgent care if worse.      Advised patient call the office or your primary care doctor for an appointment if no improvement within 72 hours or if any symptoms change or worsen at any time  Advised ER or urgent Care if after hours or on weekend. Call 911 for emergency symptoms at any time.Patinet verbalized understanding of all instructions given/reviewed and treatment plan and has no further questions or concerns at this time.      [place] Pixie Burgener, FNP, have reviewed all documentation for this visit. The documentation on 01/03/20 for the exam, diagnosis, procedures, and orders are all accurate and complete.    Marcille Buffy, [place] [phone] (phone) [phone] (fax)  [place]

## 2020-01-02 NOTE — Patient Instructions (Signed)

## 2020-01-03 DIAGNOSIS — A419 Sepsis, unspecified organism: Secondary | ICD-10-CM | POA: Diagnosis not present

## 2020-01-03 DIAGNOSIS — L03116 Cellulitis of left lower limb: Secondary | ICD-10-CM | POA: Diagnosis not present

## 2020-01-03 DIAGNOSIS — R87619 Unspecified abnormal cytological findings in specimens from cervix uteri: Secondary | ICD-10-CM | POA: Insufficient documentation

## 2020-01-04 ENCOUNTER — Telehealth: Payer: Self-pay

## 2020-01-04 LAB — COMPREHENSIVE METABOLIC PANEL
ALT: 15 IU/L (ref 0–32)
AST: 19 IU/L (ref 0–40)
Albumin/Globulin Ratio: 2.1 (ref 1.2–2.2)
Albumin: 4.7 g/dL (ref 3.8–4.9)
Alkaline Phosphatase: 74 IU/L (ref 48–121)
BUN/Creatinine Ratio: 19 (ref 12–28)
BUN: 21 mg/dL (ref 8–27)
Bilirubin Total: 0.4 mg/dL (ref 0.0–1.2)
CO2: 22 mmol/L (ref 20–29)
Calcium: 10.3 mg/dL (ref 8.7–10.3)
Chloride: 103 mmol/L (ref 96–106)
Creatinine, Ser: 1.11 mg/dL — ABNORMAL HIGH (ref 0.57–1.00)
GFR calc Af Amer: 62 mL/min/{1.73_m2} (ref >59)
GFR calc non Af Amer: 54 mL/min/{1.73_m2} — ABNORMAL LOW (ref >59)
Globulin, Total: 2.2 g/dL (ref 1.5–4.5)
Glucose: 111 mg/dL — ABNORMAL HIGH (ref 65–99)
Potassium: 4.3 mmol/L (ref 3.5–5.2)
Sodium: 140 mmol/L (ref 134–144)
Total Protein: 6.9 g/dL (ref 6.0–8.5)

## 2020-01-04 LAB — CBC WITH DIFFERENTIAL/PLATELET
Basophils Absolute: 0.1 10*3/uL (ref 0.0–0.2)
Basos: 1 %
EOS (ABSOLUTE): 0.2 10*3/uL (ref 0.0–0.4)
Eos: 2 %
Hematocrit: 45.9 % (ref 34.0–46.6)
Hemoglobin: 15.4 g/dL (ref 11.1–15.9)
Immature Grans (Abs): 0 10*3/uL (ref 0.0–0.1)
Immature Granulocytes: 1 %
Lymphocytes Absolute: 1.9 10*3/uL (ref 0.7–3.1)
Lymphs: 31 %
MCH: 31 pg (ref 26.6–33.0)
MCHC: 33.6 g/dL (ref 31.5–35.7)
MCV: 93 fL (ref 79–97)
Monocytes Absolute: 0.5 10*3/uL (ref 0.1–0.9)
Monocytes: 8 %
Neutrophils Absolute: 3.6 10*3/uL (ref 1.4–7.0)
Neutrophils: 57 %
Platelets: 346 10*3/uL (ref 150–450)
RBC: 4.96 x10E6/uL (ref 3.77–5.28)
RDW: 13.8 % (ref 11.7–15.4)
WBC: 6.3 10*3/uL (ref 3.4–10.8)

## 2020-01-04 NOTE — Progress Notes (Signed)
Please add on a hemoglobin A1c-elevated glucose. CBC has elevated glucose, white blood cell count has returned to normal.  CMP shows mildly decreased kidney function she did just get off clindamycin and was hospitalized.  Recommend that she hydrate with water and just recheck her kidney function around 2 months, since she was normal 9 days ago.  Potassium is within normal limits at 4.3 now. We will recheck a CBC and CMP at 2 months and keep follow-up visit report any new or changing symptoms and return to the office if any signs of infection.  Seek after-hours care if emergent.

## 2020-01-04 NOTE — Telephone Encounter (Signed)
Patient called and given message below from [place]. Patient says she's doing better, more tired in the afternoon and she just finished her antibiotics today. Appointment scheduled for Monday, 02/04/20 at 1100 with Laverna Peace, [place].

## 2020-01-04 NOTE — Telephone Encounter (Signed)
Attempted to contact patient to make one month follow up with Rachael Brewer, If patient returns call okay for [facility] to make one month appt. KW

## 2020-01-04 NOTE — Telephone Encounter (Signed)
-----   Message from Doreen Beam, [place] sent at 01/03/2020 10:46 AM EDT ----- Please call and schedule a one month follow up in office - for recheck of left leg and to be sure she is still doing well. Meeting was in place when she left office. Told her office would call.

## 2020-01-07 NOTE — Telephone Encounter (Signed)
Thank you she should be advised to rest and hydrate and if any symptoms worsen or do not improve within a week or worse at anytime be seen .

## 2020-01-07 NOTE — Telephone Encounter (Signed)
FYI. KW 

## 2020-01-17 ENCOUNTER — Ambulatory Visit (INDEPENDENT_AMBULATORY_CARE_PROVIDER_SITE_OTHER): Payer: BC Managed Care – PPO | Admitting: Family Medicine

## 2020-01-31 ENCOUNTER — Ambulatory Visit (INDEPENDENT_AMBULATORY_CARE_PROVIDER_SITE_OTHER): Payer: BC Managed Care – PPO | Admitting: Family Medicine

## 2020-02-04 ENCOUNTER — Ambulatory Visit: Payer: Self-pay | Admitting: Adult Health

## 2020-02-08 ENCOUNTER — Ambulatory Visit: Payer: Self-pay | Admitting: Physician Assistant

## 2020-02-11 ENCOUNTER — Ambulatory Visit: Payer: Self-pay | Admitting: Physician Assistant

## 2020-02-12 ENCOUNTER — Ambulatory Visit: Payer: Self-pay | Admitting: Family Medicine

## 2020-02-18 ENCOUNTER — Other Ambulatory Visit: Payer: Self-pay

## 2020-02-18 ENCOUNTER — Other Ambulatory Visit (HOSPITAL_COMMUNITY)
Admission: RE | Admit: 2020-02-18 | Discharge: 2020-02-18 | Disposition: A | Discharge status: Home / Self Care | Payer: BC Managed Care – PPO | Source: Ambulatory Visit | Attending: Family Medicine | Admitting: Family Medicine

## 2020-02-18 ENCOUNTER — Ambulatory Visit (INDEPENDENT_AMBULATORY_CARE_PROVIDER_SITE_OTHER): Payer: BC Managed Care – PPO | Admitting: Family Medicine

## 2020-02-18 ENCOUNTER — Encounter: Payer: Self-pay | Admitting: Family Medicine

## 2020-02-18 VITALS — BP 103/64 | HR 72 | Temp 98.4°F | Resp 16 | Ht 63.0 in | Wt 248.0 lb

## 2020-02-18 DIAGNOSIS — Z8742 Personal history of other diseases of the female genital tract: Secondary | ICD-10-CM | POA: Insufficient documentation

## 2020-02-18 DIAGNOSIS — I10 Essential (primary) hypertension: Secondary | ICD-10-CM | POA: Diagnosis not present

## 2020-02-18 DIAGNOSIS — L03116 Cellulitis of left lower limb: Secondary | ICD-10-CM | POA: Diagnosis not present

## 2020-02-18 DIAGNOSIS — N898 Other specified noninflammatory disorders of vagina: Secondary | ICD-10-CM | POA: Insufficient documentation

## 2020-02-18 DIAGNOSIS — R87610 Atypical squamous cells of undetermined significance on cytologic smear of cervix (ASC-US): Secondary | ICD-10-CM | POA: Insufficient documentation

## 2020-02-18 NOTE — Patient Instructions (Addendum)
Please monitor blood pressure readings at home. If blood pressure continues to be low, decrease torsemide to 20 mg daily.

## 2020-02-18 NOTE — Assessment & Plan Note (Signed)
Status post hospitalization for sepsis related to cellulitis Now completely resolved WBC back to normal when last checked, no need to recheck today Discussed importance of wound management and compression stockings for venous stasis control

## 2020-02-18 NOTE — Assessment & Plan Note (Signed)
Discussed results of Pap smear with patient and reviewed ASCCP guidelines Given that HPV is negative, recommend cotesting repeated in 3 years No need for GYN referral, colposcopy, biopsy as previously referred by PA

## 2020-02-18 NOTE — Progress Notes (Signed)
Established patient visit   Patient: Rachael Brewer   DOB: Sep 10, 1960   59 y.o. Female  MRN: [id number] Visit Date: 02/18/2020  Today's healthcare provider: Lavon Paganini, MD   I,Sulibeya S Dimas,acting as a scribe for Lavon Paganini, MD.,have documented all relevant documentation on the behalf of Lavon Paganini, MD,as directed by  Lavon Paganini, MD while in the presence of Lavon Paganini, MD.  Chief Complaint  Patient presents with   Follow-up   Subjective    HPI  Follow up for left leg cellulitis   The patient was last seen for this 1 months ago. Changes made at last visit include no changes.  She reports excellent compliance with treatment. She feels that condition is Improved. She is not having side effects.   -----------------------------------------------------------------------------------------  Follow up for vaginal discharge  The patient was last seen for this 2 months ago. Changes made at last visit include check pap.  She feels that condition is Unchanged.  -----------------------------------------------------------------------------------------   Patient Active Problem List   Diagnosis Date Noted   Atypical squamous cell changes of undetermined significance (ASCUS) on cervical cytology with negative high risk human papilloma virus (HPV) test result 02/18/2020   Atypical endocervical cells on Pap smear 01/03/2020   Left leg cellulitis 12/24/2019   Vaginal discharge 12/12/2019   Pre-diabetes 12/12/2019   Total knee replacement status 05/02/2019   Lymphedema 05/07/2016   Morbid obesity ([place]) 01/01/2016   Venous insufficiency 01/01/2016   Pes planus of both feet 01/01/2016   Apnea 01/01/2016   AB (asthmatic bronchitis) 06/10/2015   Allergic rhinitis 06/10/2015   Benign paroxysmal positional nystagmus 06/10/2015   Acid reflux 06/10/2015   Lesion of nose 06/10/2015   Low back strain 06/10/2015   Plantar  fasciitis 12/02/2014   Posterior tibial tendonitis 12/02/2014   Arthropathia 12/18/2008   Depression, recurrent ([place]) 07/21/2006   Hypercholesteremia 07/21/2006   BP (high blood pressure) 07/21/2006   Headache, migraine 07/21/2006   Social History   Tobacco Use   Smoking status: Former Smoker    Packs/day: 1.00    Years: 15.00    Pack years: 15.00    Quit date: 06/21/1999    Years since quitting: 20.6   Smokeless tobacco: Never Used  Vaping Use   Vaping Use: Never used  Substance Use Topics   Alcohol use: Yes    Alcohol/week: 0.0 standard drinks    Comment: occasional   Drug use: No   Allergies  Allergen Reactions   Bactrim [Sulfamethoxazole-Trimethoprim] Shortness Of Breath and Swelling   Sulfa Antibiotics Anaphylaxis   Sumatriptan     Made migraine more intense and scalp felt like it was on fire   Penicillins Hives    Did it involve swelling of the face/tongue/throat, SOB, or low BP? No Did it involve sudden or severe rash/hives, skin peeling, or any reaction on the inside of your mouth or nose? No Did you need to seek medical attention at a hospital or doctor's office? No When did it last happen?10 years ago If all above answers are "NO", may proceed with cephalosporin use.        Medications: Outpatient Medications Prior to Visit  Medication Sig   albuterol (VENTOLIN HFA) 108 (90 Base) MCG/ACT inhaler Inhale 1-2 puffs into the lungs every 6 (six) hours as needed for wheezing or shortness of breath.   aspirin-acetaminophen-caffeine (EXCEDRIN MIGRAINE) 250-250-65 MG tablet Take 2 tablets by mouth every 8 (eight) hours as needed for migraine.  Azelastine HCl 137 MCG/SPRAY SOLN Place 1 spray into both nostrils at bedtime.    baclofen (LIORESAL) 10 MG tablet Take 10 mg by mouth 2 (two) times daily as needed for muscle spasms. LIMIT 2 DAYS PER WEEK   buPROPion (WELLBUTRIN) 100 MG tablet Take 1 tablet (100 mg total) by mouth 2 (two) times  daily.   butalbital-acetaminophen-caffeine (FIORICET) 50-325-40 MG tablet Take 1-2 tablets by mouth every 6 (six) hours as needed for headache.   celecoxib (CELEBREX) 200 MG capsule Take 1 capsule (200 mg total) by mouth 2 (two) times daily.   escitalopram (LEXAPRO) 20 MG tablet Take 1 tablet (20 mg total) by mouth daily.   fluconazole (DIFLUCAN) 150 MG tablet Take 150 mg by mouth once.    gabapentin (NEURONTIN) 600 MG tablet Take 1,200 mg by mouth at bedtime.    Multiple Vitamin (MULTIVITAMIN WITH MINERALS) TABS tablet Take 1 tablet by mouth daily.   Potassium Chloride 40 MEQ/15ML (20%) SOLN Take 40 mEq by mouth daily.   rizatriptan (MAXALT) 10 MG tablet Take 10 mg by mouth every 2 (two) hours as needed for migraine (max 2 tablets/24 hrs.).    topiramate ER (QUDEXY XR) 150 MG CS24 sprinkle capsule Take 150 mg by mouth at bedtime.    torsemide (DEMADEX) 20 MG tablet Take 2 tablets (40 mg total) by mouth daily.   No facility-administered medications prior to visit.    Review of Systems  Constitutional: Negative.   Respiratory: Negative.   Cardiovascular: Negative.   Neurological: Negative for dizziness, syncope, weakness, light-headedness and headaches.  Psychiatric/Behavioral: Negative.     Last CBC Lab Results  Component Value Date   WBC 6.3 01/03/2020   HGB 15.4 01/03/2020   HCT 45.9 01/03/2020   MCV 93 01/03/2020   MCH 31.0 01/03/2020   RDW 13.8 01/03/2020   PLT 346 06/23/7251   Last metabolic panel Lab Results  Component Value Date   GLUCOSE 111 (H) 01/03/2020   NA 140 01/03/2020   K 4.3 01/03/2020   CL 103 01/03/2020   CO2 22 01/03/2020   BUN 21 01/03/2020   CREATININE 1.11 (H) 01/03/2020   GFRNONAA 54 (L) 01/03/2020   GFRAA 62 01/03/2020   CALCIUM 10.3 01/03/2020   PHOS 3.7 12/26/2019   PROT 6.9 01/03/2020   ALBUMIN 4.7 01/03/2020   LABGLOB 2.2 01/03/2020   AGRATIO 2.1 01/03/2020   BILITOT 0.4 01/03/2020   ALKPHOS 74 01/03/2020   AST 19 01/03/2020    ALT 15 01/03/2020   ANIONGAP 8 12/26/2019      Objective    BP 103/64 (BP Location: Left Arm, Patient Position: Sitting, Cuff Size: Large)    Pulse 72    Temp 98.4 F (36.9 C) (Oral)    Resp 16    Ht 5\' 3"  (1.6 m)    Wt 248 lb (112.5 kg)    LMP  (LMP Unknown)    BMI 43.93 kg/m  BP Readings from Last 3 Encounters:  02/18/20 103/64  01/02/20 122/78  12/26/19 121/63   Wt Readings from Last 3 Encounters:  02/18/20 248 lb (112.5 kg)  01/02/20 248 lb 3.2 oz (112.6 kg)  12/26/19 255 lb 11.2 oz (116 kg)      Physical Exam Vitals reviewed.  Constitutional:      General: She is not in acute distress.    Appearance: Normal appearance. She is well-developed. She is not diaphoretic.  HENT:     Head: Normocephalic and atraumatic.  Eyes:  General: No scleral icterus.    Conjunctiva/sclera: Conjunctivae normal.  Neck:     Thyroid: No thyromegaly.  Cardiovascular:     Rate and Rhythm: Normal rate and regular rhythm.     Pulses: Normal pulses.     Heart sounds: Normal heart sounds. No murmur heard.   Pulmonary:     Effort: Pulmonary effort is normal. No respiratory distress.     Breath sounds: Normal breath sounds. No wheezing, rhonchi or rales.  Musculoskeletal:     Cervical back: Neck supple.     Right lower leg: No edema.     Left lower leg: No edema.  Lymphadenopathy:     Cervical: No cervical adenopathy.  Skin:    General: Skin is warm and dry.     Findings: No rash.  Neurological:     Mental Status: She is alert and oriented to person, place, and time. Mental status is at baseline.  Psychiatric:        Mood and Affect: Mood normal.        Behavior: Behavior normal.     No results found for any visits on 02/18/20.  Assessment & Plan     Problem List Items Addressed This Visit      Cardiovascular and Mediastinum   BP (high blood pressure)    Blood pressure low normal today Asymptomatic Continue current medications Monitor home blood pressure If blood  pressure continues to be low, consider halving torsemide dose Given slight elevation in creatinine on last check, recheck BMP today Follow-up in 4 months      Relevant Orders   Basic metabolic panel     Genitourinary   Atypical squamous cell changes of undetermined significance (ASCUS) on cervical cytology with negative high risk human papilloma virus (HPV) test result    Discussed results of Pap smear with patient and reviewed ASCCP guidelines Given that HPV is negative, recommend cotesting repeated in 3 years No need for GYN referral, colposcopy, biopsy as previously referred by PA        Other   Vaginal discharge    Ongoing Sounds like physiologic discharge Exam was done at last visit No changes Declines STD testing Test for BV and yeast with self swab Treatment pending results      Relevant Orders   Cervicovaginal ancillary only   Left leg cellulitis - Primary    Status post hospitalization for sepsis related to cellulitis Now completely resolved WBC back to normal when last checked, no need to recheck today Discussed importance of wound management and compression stockings for venous stasis control      Relevant Orders   Basic metabolic panel       Return in about 4 months (around 06/19/2020) for chronic disease f/u.      I, Lavon Paganini, MD, have reviewed all documentation for this visit. The documentation on 02/18/20 for the exam, diagnosis, procedures, and orders are all accurate and complete.   Celine Dishman, Dionne Bucy, MD, MPH [facility]

## 2020-02-18 NOTE — Assessment & Plan Note (Signed)
Ongoing Sounds like physiologic discharge Exam was done at last visit No changes Declines STD testing Test for BV and yeast with self swab Treatment pending results

## 2020-02-18 NOTE — Assessment & Plan Note (Signed)
Blood pressure low normal today Asymptomatic Continue current medications Monitor home blood pressure If blood pressure continues to be low, consider halving torsemide dose Given slight elevation in creatinine on last check, recheck BMP today Follow-up in 4 months

## 2020-02-19 ENCOUNTER — Telehealth: Payer: Self-pay

## 2020-02-19 LAB — CERVICOVAGINAL ANCILLARY ONLY
Bacterial Vaginitis (gardnerella): NEGATIVE
Candida Glabrata: NEGATIVE
Candida Vaginitis: POSITIVE — AB
Comment: NEGATIVE
Comment: NEGATIVE
Comment: NEGATIVE

## 2020-02-19 NOTE — Telephone Encounter (Signed)
Patient does prefer oral, but she said what ever you suggest. Patient also wants to know if you have any idea why she still has yeast. Please advise.

## 2020-02-19 NOTE — Telephone Encounter (Signed)
-----   Message from Virginia Crews, MD sent at 02/19/2020 11:23 AM EDT ----- Still positive for yeast.  She took diflucan orally last time, so could consider topical vaginal cream instead this time.  See if she is interested in trying pill or cream this time.

## 2020-02-20 DIAGNOSIS — L03116 Cellulitis of left lower limb: Secondary | ICD-10-CM | POA: Diagnosis not present

## 2020-02-20 DIAGNOSIS — I10 Essential (primary) hypertension: Secondary | ICD-10-CM | POA: Diagnosis not present

## 2020-02-20 MED ORDER — TERCONAZOLE 0.4 % VA CREA: 1.0000 | TOPICAL_CREAM | Freq: Every day | VAGINAL | 0 refills | Status: DC

## 2020-02-20 NOTE — Telephone Encounter (Signed)
May be yeast resistant to Diflucan, which is what we used to treat previously.  Recommend Terconazole vaginal cream x7days.  1 applicator full at bedtime. Ok to erx if patient agrees.

## 2020-02-20 NOTE — Telephone Encounter (Signed)
Patient advised as below. Patient verbalizes understanding and is in agreement with treatment plan.  

## 2020-02-21 LAB — BASIC METABOLIC PANEL
BUN/Creatinine Ratio: 24 (ref 12–28)
BUN: 26 mg/dL (ref 8–27)
CO2: 24 mmol/L (ref 20–29)
Calcium: 9.3 mg/dL (ref 8.7–10.3)
Chloride: 101 mmol/L (ref 96–106)
Creatinine, Ser: 1.1 mg/dL — ABNORMAL HIGH (ref 0.57–1.00)
GFR calc Af Amer: 63 mL/min/{1.73_m2} (ref >59)
GFR calc non Af Amer: 55 mL/min/{1.73_m2} — ABNORMAL LOW (ref >59)
Glucose: 108 mg/dL — ABNORMAL HIGH (ref 65–99)
Potassium: 3.8 mmol/L (ref 3.5–5.2)
Sodium: 139 mmol/L (ref 134–144)

## 2020-02-22 ENCOUNTER — Telehealth: Payer: Self-pay

## 2020-02-22 NOTE — Telephone Encounter (Signed)
LMTCB, PEC may give result.  

## 2020-02-22 NOTE — Telephone Encounter (Signed)
Pt given lab results per notes of Dr. Brita Romp on 02/21/20. Pt verbalized understanding.

## 2020-02-22 NOTE — Telephone Encounter (Signed)
-----   Message from Virginia Crews, MD sent at 02/21/2020  1:21 PM EDT ----- Normal labs

## 2020-04-17 DIAGNOSIS — H40013 Open angle with borderline findings, low risk, bilateral: Secondary | ICD-10-CM | POA: Diagnosis not present

## 2020-04-17 DIAGNOSIS — H524 Presbyopia: Secondary | ICD-10-CM | POA: Diagnosis not present

## 2020-04-17 DIAGNOSIS — H52223 Regular astigmatism, bilateral: Secondary | ICD-10-CM | POA: Diagnosis not present

## 2020-04-17 DIAGNOSIS — H5203 Hypermetropia, bilateral: Secondary | ICD-10-CM | POA: Diagnosis not present

## 2020-06-02 DIAGNOSIS — G4733 Obstructive sleep apnea (adult) (pediatric): Secondary | ICD-10-CM | POA: Diagnosis not present

## 2020-06-04 ENCOUNTER — Other Ambulatory Visit: Payer: Self-pay | Admitting: Family Medicine

## 2020-06-10 DIAGNOSIS — Z96653 Presence of artificial knee joint, bilateral: Secondary | ICD-10-CM | POA: Diagnosis not present

## 2020-06-16 NOTE — Progress Notes (Deleted)
Established patient visit   Patient: Rachael Brewer   DOB: April 03, 1961   59 y.o. Female  MRN: [id number] Visit Date: 06/23/2020  Today's healthcare provider: Lavon Paganini, MD   No chief complaint on file.  Subjective    HPI  Hypertension, follow-up  BP Readings from Last 3 Encounters:  02/18/20 103/64  01/02/20 122/78  12/26/19 121/63   Wt Readings from Last 3 Encounters:  02/18/20 248 lb (112.5 kg)  01/02/20 248 lb 3.2 oz (112.6 kg)  12/26/19 255 lb 11.2 oz (116 kg)     She was last seen for hypertension 4 months ago.  BP at that visit was 103/64. Management since that visit includes no change.  She reports {excellent/good/fair/poor:19665} compliance with treatment. She {is/is not:9024} having side effects. {document side effects if present:1} She is following a {diet:21022986} diet. She {is/is not:9024} exercising. She {does/does not:200015} smoke.  Use of agents associated with hypertension: {bp agents assoc with hypertension:511::"none"}.   Outside blood pressures are {***enter patient reported home BP readings, or 'not being checked':1}. Symptoms: {Yes/No:20286} chest pain {Yes/No:20286} chest pressure  {Yes/No:20286} palpitations {Yes/No:20286} syncope  {Yes/No:20286} dyspnea {Yes/No:20286} orthopnea  {Yes/No:20286} paroxysmal nocturnal dyspnea {Yes/No:20286} lower extremity edema   Pertinent labs: Lab Results  Component Value Date   CHOL 137 12/19/2019   HDL 40 12/19/2019   LDLCALC 73 12/19/2019   TRIG 138 12/19/2019   CHOLHDL 3.4 12/19/2019   Lab Results  Component Value Date   NA 139 02/20/2020   K 3.8 02/20/2020   CREATININE 1.10 (H) 02/20/2020   GFRNONAA 55 (L) 02/20/2020   GFRAA 63 02/20/2020   GLUCOSE 108 (H) 02/20/2020     The 10-year ASCVD risk score Mikey Bussing DC Jr., et al., 2013) is: 3.8%   --------------------------------------------------------------------------------------------------- Lipid/Cholesterol, Follow-up  Last  lipid panel Other pertinent labs  Lab Results  Component Value Date   CHOL 137 12/19/2019   HDL 40 12/19/2019   LDLCALC 73 12/19/2019   TRIG 138 12/19/2019   CHOLHDL 3.4 12/19/2019   Lab Results  Component Value Date   ALT 15 01/03/2020   AST 19 01/03/2020   PLT 346 01/03/2020   TSH 1.270 02/09/2018     She was last seen for this 6 months ago.  Management since that visit includes check labs.  She reports {excellent/good/fair/poor:19665} compliance with treatment. She {is/is not:9024} having side effects. {document side effects if present:1}  Symptoms: {Yes/No:20286} chest pain {Yes/No:20286} chest pressure/discomfort  {Yes/No:20286} dyspnea {Yes/No:20286} lower extremity edema  {Yes/No:20286} numbness or tingling of extremity {Yes/No:20286} orthopnea  {Yes/No:20286} palpitations {Yes/No:20286} paroxysmal nocturnal dyspnea  {Yes/No:20286} speech difficulty {Yes/No:20286} syncope   Current diet: {diet habits:16563} Current exercise: {exercise types:16438}  The 10-year ASCVD risk score Mikey Bussing DC Jr., et al., 2013) is: 3.8%  --------------------------------------------------------------------------------------------------- Prediabetes, Follow-up  Lab Results  Component Value Date   HGBA1C 6.0 (H) 12/19/2019   HGBA1C 5.7 (H) 05/13/2017   HGBA1C 5.6 12/16/2015   GLUCOSE 108 (H) 02/20/2020   GLUCOSE 111 (H) 01/03/2020   GLUCOSE 104 (H) 12/26/2019    Last seen for for this6 months ago.  Management since that visit includes no changes. Current symptoms include {Symptoms; diabetes:14075} and have been {Desc; course:15616}.  Prior visit with dietician: {yes/no:17258} Current diet: {diet habits:16563} Current exercise: {exercise types:16438}  Pertinent Labs:    Component Value Date/Time   CHOL 137 12/19/2019 0931   TRIG 138 12/19/2019 0931   CHOLHDL 3.4 12/19/2019 0931   CHOLHDL 2.8 05/13/2017 0855   CREATININE  1.10 (H) 02/20/2020 0803   CREATININE 0.92 05/13/2017  0855    Wt Readings from Last 3 Encounters:  02/18/20 248 lb (112.5 kg)  01/02/20 248 lb 3.2 oz (112.6 kg)  12/26/19 255 lb 11.2 oz (116 kg)    -----------------------------------------------------------------------------------------   Patient Active Problem List   Diagnosis Date Noted  . Atypical squamous cell changes of undetermined significance (ASCUS) on cervical cytology with negative high risk human papilloma virus (HPV) test result 02/18/2020  . Atypical endocervical cells on Pap smear 01/03/2020  . Left leg cellulitis 12/24/2019  . Vaginal discharge 12/12/2019  . Pre-diabetes 12/12/2019  . Total knee replacement status 05/02/2019  . Lymphedema 05/07/2016  . Morbid obesity ([place]) 01/01/2016  . Venous insufficiency 01/01/2016  . Pes planus of both feet 01/01/2016  . Apnea 01/01/2016  . AB (asthmatic bronchitis) 06/10/2015  . Allergic rhinitis 06/10/2015  . Benign paroxysmal positional nystagmus 06/10/2015  . Acid reflux 06/10/2015  . Lesion of nose 06/10/2015  . Low back strain 06/10/2015  . Plantar fasciitis 12/02/2014  . Posterior tibial tendonitis 12/02/2014  . Arthropathia 12/18/2008  . Depression, recurrent ([place]) 07/21/2006  . Hypercholesteremia 07/21/2006  . BP (high blood pressure) 07/21/2006  . Headache, migraine 07/21/2006   Social History   Tobacco Use  . Smoking status: Former Smoker    Packs/day: 1.00    Years: 15.00    Pack years: 15.00    Quit date: 06/21/1999    Years since quitting: 21.0  . Smokeless tobacco: Never Used  Vaping Use  . Vaping Use: Never used  Substance Use Topics  . Alcohol use: Yes    Alcohol/week: 0.0 standard drinks    Comment: occasional  . Drug use: No   Allergies  Allergen Reactions  . Bactrim [Sulfamethoxazole-Trimethoprim] Shortness Of Breath and Swelling  . Sulfa Antibiotics Anaphylaxis  . Sumatriptan     Made migraine more intense and scalp felt like it was on fire  . Penicillins Hives    Did it involve  swelling of the face/tongue/throat, SOB, or low BP? No Did it involve sudden or severe rash/hives, skin peeling, or any reaction on the inside of your mouth or nose? No Did you need to seek medical attention at a hospital or doctor's office? No When did it last happen?10 years ago If all above answers are "NO", may proceed with cephalosporin use.        Medications: Outpatient Medications Prior to Visit  Medication Sig  . albuterol (VENTOLIN HFA) 108 (90 Base) MCG/ACT inhaler Inhale 1-2 puffs into the lungs every 6 (six) hours as needed for wheezing or shortness of breath.  Marland Kitchen aspirin-acetaminophen-caffeine (EXCEDRIN MIGRAINE) 250-250-65 MG tablet Take 2 tablets by mouth every 8 (eight) hours as needed for migraine.   . Azelastine HCl 137 MCG/SPRAY SOLN Place 1 spray into both nostrils at bedtime.   . baclofen (LIORESAL) 10 MG tablet Take 10 mg by mouth 2 (two) times daily as needed for muscle spasms. LIMIT 2 DAYS PER WEEK  . buPROPion (WELLBUTRIN) 100 MG tablet TAKE 1 TABLET BY MOUTH TWICE A DAY  . butalbital-acetaminophen-caffeine (FIORICET) 50-325-40 MG tablet Take 1-2 tablets by mouth every 6 (six) hours as needed for headache.  . celecoxib (CELEBREX) 200 MG capsule Take 1 capsule (200 mg total) by mouth 2 (two) times daily.  Marland Kitchen escitalopram (LEXAPRO) 20 MG tablet Take 1 tablet (20 mg total) by mouth daily.  . fluconazole (DIFLUCAN) 150 MG tablet Take 150 mg by mouth once.   Marland Kitchen  gabapentin (NEURONTIN) 600 MG tablet Take 1,200 mg by mouth at bedtime.   . Multiple Vitamin (MULTIVITAMIN WITH MINERALS) TABS tablet Take 1 tablet by mouth daily.  . Potassium Chloride 40 MEQ/15ML (20%) SOLN Take 40 mEq by mouth daily.  . rizatriptan (MAXALT) 10 MG tablet Take 10 mg by mouth every 2 (two) hours as needed for migraine (max 2 tablets/24 hrs.).   Marland Kitchen terconazole (TERAZOL 7) 0.4 % vaginal cream Place 1 applicator vaginally at bedtime.  . topiramate ER (QUDEXY XR) 150 MG CS24 sprinkle capsule Take  150 mg by mouth at bedtime.   . torsemide (DEMADEX) 20 MG tablet Take 2 tablets (40 mg total) by mouth daily.   No facility-administered medications prior to visit.    Review of Systems  Constitutional: Negative.   Eyes: Negative.   Respiratory: Negative.   Cardiovascular: Negative.   Gastrointestinal: Negative.   Endocrine: Negative.     Last lipids Lab Results  Component Value Date   CHOL 137 12/19/2019   HDL 40 12/19/2019   LDLCALC 73 12/19/2019   TRIG 138 12/19/2019   CHOLHDL 3.4 12/19/2019   Last thyroid functions Lab Results  Component Value Date   TSH 1.270 02/09/2018      Objective    LMP  (LMP Unknown)  BP Readings from Last 3 Encounters:  02/18/20 103/64  01/02/20 122/78  12/26/19 121/63   Wt Readings from Last 3 Encounters:  02/18/20 248 lb (112.5 kg)  01/02/20 248 lb 3.2 oz (112.6 kg)  12/26/19 255 lb 11.2 oz (116 kg)      Physical Exam  ***  No results found for any visits on 06/23/20.  Assessment & Plan     ***  No follow-ups on file.      {provider attestation***:1}   Lavon Paganini, MD  [facility] [phone] (phone) [phone] (fax)  [place]

## 2020-06-23 ENCOUNTER — Ambulatory Visit: Payer: Self-pay | Admitting: Family Medicine

## 2020-06-25 DIAGNOSIS — G43019 Migraine without aura, intractable, without status migrainosus: Secondary | ICD-10-CM | POA: Diagnosis not present

## 2020-06-25 DIAGNOSIS — G43719 Chronic migraine without aura, intractable, without status migrainosus: Secondary | ICD-10-CM | POA: Diagnosis not present

## 2020-06-25 DIAGNOSIS — G43839 Menstrual migraine, intractable, without status migrainosus: Secondary | ICD-10-CM | POA: Diagnosis not present

## 2020-06-25 DIAGNOSIS — M542 Cervicalgia: Secondary | ICD-10-CM | POA: Diagnosis not present

## 2020-07-03 ENCOUNTER — Encounter: Payer: Self-pay | Admitting: Family Medicine

## 2020-07-03 ENCOUNTER — Other Ambulatory Visit: Payer: Self-pay

## 2020-07-03 ENCOUNTER — Ambulatory Visit: Payer: BC Managed Care – PPO | Admitting: Family Medicine

## 2020-07-03 VITALS — BP 136/70 | HR 86 | Temp 97.9°F | Resp 16 | Ht 63.0 in | Wt 257.0 lb

## 2020-07-03 DIAGNOSIS — I10 Essential (primary) hypertension: Secondary | ICD-10-CM

## 2020-07-03 DIAGNOSIS — Z23 Encounter for immunization: Secondary | ICD-10-CM

## 2020-07-03 DIAGNOSIS — F339 Major depressive disorder, recurrent, unspecified: Secondary | ICD-10-CM | POA: Diagnosis not present

## 2020-07-03 DIAGNOSIS — Z6841 Body Mass Index (BMI) 40.0 and over, adult: Secondary | ICD-10-CM

## 2020-07-03 DIAGNOSIS — M542 Cervicalgia: Secondary | ICD-10-CM

## 2020-07-03 DIAGNOSIS — R7303 Prediabetes: Secondary | ICD-10-CM

## 2020-07-03 DIAGNOSIS — S39012A Strain of muscle, fascia and tendon of lower back, initial encounter: Secondary | ICD-10-CM

## 2020-07-03 MED ORDER — BACLOFEN 10 MG PO TABS
10.0000 mg | ORAL_TABLET | Freq: Two times a day (BID) | ORAL | 2 refills | Status: DC | PRN
Start: 2020-07-03 — End: 2021-01-03

## 2020-07-03 MED ORDER — ALBUTEROL SULFATE HFA 108 (90 BASE) MCG/ACT IN AERS: 1.0000 | INHALATION_SPRAY | Freq: Four times a day (QID) | RESPIRATORY_TRACT | 3 refills | Status: DC | PRN

## 2020-07-03 MED ORDER — OMEPRAZOLE 40 MG PO CPDR: 40.0000 mg | DELAYED_RELEASE_CAPSULE | Freq: Every day | ORAL | 5 refills | Status: DC

## 2020-07-03 MED ORDER — POTASSIUM CHLORIDE 40 MEQ/15ML (20%) PO SOLN: 40.0000 meq | Freq: Every day | ORAL | 3 refills | Status: DC

## 2020-07-03 MED ORDER — ESCITALOPRAM OXALATE 20 MG PO TABS: 20.0000 mg | ORAL_TABLET | Freq: Every day | ORAL | 5 refills | Status: DC

## 2020-07-03 NOTE — Progress Notes (Unsigned)
Established patient visit   Patient: Rachael Brewer   DOB: [birth year]   60 y.o. Female  MRN: [id number] Visit Date: 07/03/2020  Today's healthcare provider: Lavon Paganini, MD   Chief Complaint  Patient presents with  . Follow-up   Subjective    HPI  Depression, Follow-up  She  was last seen for this 6 months ago. Changes made at last visit include no changes.   She reports excellent compliance with treatment. She is not having side effects.   She reports excellent tolerance of treatment. Current symptoms include: depressed mood, difficulty concentrating, fatigue, hopelessness, hypersomnia and insomnia She feels she is Unchanged since last visit.  Depression screen [facility] 2/9 07/03/2020 12/12/2019 11/07/2018  Decreased Interest 3 2 1   Down, Depressed, Hopeless 1 2 1   PHQ - 2 Score 4 4 2   Altered sleeping 2 2 3   Tired, decreased energy 3 2 3   Change in appetite 3 3 3   Feeling bad or failure about yourself  3 2 1   Trouble concentrating 0 0 1  Moving slowly or fidgety/restless 0 0 3  Suicidal thoughts 0 1 0  PHQ-9 Score 15 14 16   Difficult doing work/chores Very difficult Somewhat difficult Somewhat difficult  Some recent data might be hidden    ----------------------------------------------------------------------------------------- L sided neck pain, worse with stretching to the R. Ongoing for 2-3 years, but worse over last 1-2 months.  Low back pain: All the way across. Worse for standing for long periods of time.  Ongoing for several years, but worse over last 1-2 months.  Tried heating pad, with some relief. Stretches when warm and that feels better.  Patient Active Problem List   Diagnosis Date Noted  . Atypical squamous cell changes of undetermined significance (ASCUS) on cervical cytology with negative high risk human papilloma virus (HPV) test result 02/18/2020  . Pre-diabetes 12/12/2019  . Total knee replacement status 05/02/2019  . Lymphedema  05/07/2016  . Morbid obesity ([place]) 01/01/2016  . Venous insufficiency 01/01/2016  . Pes planus of both feet 01/01/2016  . Apnea 01/01/2016  . AB (asthmatic bronchitis) 06/10/2015  . Allergic rhinitis 06/10/2015  . Benign paroxysmal positional nystagmus 06/10/2015  . Acid reflux 06/10/2015  . Lesion of nose 06/10/2015  . Low back strain 06/10/2015  . Plantar fasciitis 12/02/2014  . Posterior tibial tendonitis 12/02/2014  . Arthropathia 12/18/2008  . Depression, recurrent ([place]) 07/21/2006  . Hypercholesteremia 07/21/2006  . BP (high blood pressure) 07/21/2006  . Headache, migraine 07/21/2006   Social History   Tobacco Use  . Smoking status: Former Smoker    Packs/day: 1.00    Years: 15.00    Pack years: 15.00    Quit date: 06/21/1999    Years since quitting: 21.0  . Smokeless tobacco: Never Used  Vaping Use  . Vaping Use: Never used  Substance Use Topics  . Alcohol use: Yes    Alcohol/week: 0.0 standard drinks    Comment: occasional  . Drug use: No   Allergies  Allergen Reactions  . Bactrim [Sulfamethoxazole-Trimethoprim] Shortness Of Breath and Swelling  . Sulfa Antibiotics Anaphylaxis  . Sumatriptan     Made migraine more intense and scalp felt like it was on fire  . Penicillins Hives    Did it involve swelling of the face/tongue/throat, SOB, or low BP? No Did it involve sudden or severe rash/hives, skin peeling, or any reaction on the inside of your mouth or nose? No Did you need to seek medical attention  at a hospital or doctor's office? No When did it last happen?10 years ago If all above answers are "NO", may proceed with cephalosporin use.        Medications: Outpatient Medications Prior to Visit  Medication Sig  . aspirin-acetaminophen-caffeine (EXCEDRIN MIGRAINE) 250-250-65 MG tablet Take 2 tablets by mouth every 8 (eight) hours as needed for migraine.   . Azelastine HCl 137 MCG/SPRAY SOLN Place 1 spray into both nostrils at bedtime.  Marland Kitchen buPROPion  (WELLBUTRIN) 100 MG tablet TAKE 1 TABLET BY MOUTH TWICE A DAY  . butalbital-acetaminophen-caffeine (FIORICET) 50-325-40 MG tablet Take 1-2 tablets by mouth every 6 (six) hours as needed for headache.  . gabapentin (NEURONTIN) 600 MG tablet Take 1,200 mg by mouth at bedtime.   . Multiple Vitamin (MULTIVITAMIN WITH MINERALS) TABS tablet Take 1 tablet by mouth daily.  . rizatriptan (MAXALT) 10 MG tablet Take 10 mg by mouth every 2 (two) hours as needed for migraine (max 2 tablets/24 hrs.).   Marland Kitchen topiramate ER (QUDEXY XR) 150 MG CS24 sprinkle capsule Take 150 mg by mouth at bedtime.   . torsemide (DEMADEX) 20 MG tablet Take 2 tablets (40 mg total) by mouth daily.  . [DISCONTINUED] albuterol (VENTOLIN HFA) 108 (90 Base) MCG/ACT inhaler Inhale 1-2 puffs into the lungs every 6 (six) hours as needed for wheezing or shortness of breath.  . [DISCONTINUED] baclofen (LIORESAL) 10 MG tablet Take 10 mg by mouth 2 (two) times daily as needed for muscle spasms. LIMIT 2 DAYS PER WEEK  . [DISCONTINUED] escitalopram (LEXAPRO) 20 MG tablet Take 1 tablet (20 mg total) by mouth daily.  . [DISCONTINUED] omeprazole (PRILOSEC) 40 MG capsule Take 40 mg by mouth daily.  . [DISCONTINUED] Potassium Chloride 40 MEQ/15ML (20%) SOLN Take 40 mEq by mouth daily.  . [DISCONTINUED] celecoxib (CELEBREX) 200 MG capsule Take 1 capsule (200 mg total) by mouth 2 (two) times daily. (Patient not taking: Reported on 07/03/2020)  . [DISCONTINUED] fluconazole (DIFLUCAN) 150 MG tablet Take 150 mg by mouth once.  (Patient not taking: Reported on 07/03/2020)  . [DISCONTINUED] terconazole (TERAZOL 7) 0.4 % vaginal cream Place 1 applicator vaginally at bedtime. (Patient not taking: Reported on 07/03/2020)   No facility-administered medications prior to visit.    Review of Systems  Constitutional: Positive for fatigue. Negative for chills.  Respiratory: Negative for cough, shortness of breath and wheezing.   Cardiovascular: Negative for chest pain,  palpitations and leg swelling.  Psychiatric/Behavioral: Positive for decreased concentration and sleep disturbance. The patient is nervous/anxious.     Last CBC Lab Results  Component Value Date   WBC 6.3 01/03/2020   HGB 15.4 01/03/2020   HCT 45.9 01/03/2020   MCV 93 01/03/2020   MCH 31.0 01/03/2020   RDW 13.8 01/03/2020   PLT 346 01/03/2020      Objective    BP 136/70 (BP Location: Left Arm, Patient Position: Sitting, Cuff Size: Large)   Pulse 86   Temp 97.9 F (36.6 C) (Oral)   Resp 16   Ht 5\' 3"  (1.6 m)   Wt 257 lb (116.6 kg)   LMP  (LMP Unknown)   SpO2 99%   BMI 45.53 kg/m  BP Readings from Last 3 Encounters:  07/03/20 136/70  02/18/20 103/64  01/02/20 122/78   Wt Readings from Last 3 Encounters:  07/03/20 257 lb (116.6 kg)  02/18/20 248 lb (112.5 kg)  01/02/20 248 lb 3.2 oz (112.6 kg)      Physical Exam Vitals reviewed.  Constitutional:  General: She is not in acute distress.    Appearance: Normal appearance. She is well-developed. She is not diaphoretic.  HENT:     Head: Normocephalic and atraumatic.  Eyes:     General: No scleral icterus.    Conjunctiva/sclera: Conjunctivae normal.  Neck:     Thyroid: No thyromegaly.  Cardiovascular:     Rate and Rhythm: Normal rate and regular rhythm.     Pulses: Normal pulses.     Heart sounds: Normal heart sounds. No murmur heard.   Pulmonary:     Effort: Pulmonary effort is normal. No respiratory distress.     Breath sounds: Normal breath sounds. No wheezing, rhonchi or rales.  Musculoskeletal:     Cervical back: Neck supple.     Right lower leg: No edema.     Left lower leg: No edema.  Lymphadenopathy:     Cervical: No cervical adenopathy.  Skin:    General: Skin is warm and dry.     Findings: No rash.  Neurological:     Mental Status: She is alert and oriented to person, place, and time. Mental status is at baseline.  Psychiatric:        Mood and Affect: Mood normal.        Behavior:  Behavior normal.       Results for orders placed or performed in visit on [id number]  Basic Metabolic Panel (BMET)  Result Value Ref Range   Glucose 99 65 - 99 mg/dL   BUN 18 8 - 27 mg/dL   Creatinine, Ser 0.95 0.57 - 1.00 mg/dL   GFR calc non Af Amer 65 >59 mL/min/1.73   GFR calc Af Amer 75 >59 mL/min/1.73   BUN/Creatinine Ratio 19 12 - 28   Sodium 140 134 - 144 mmol/L   Potassium 3.9 3.5 - 5.2 mmol/L   Chloride 105 96 - 106 mmol/L   CO2 23 20 - 29 mmol/L   Calcium 9.0 8.7 - 10.3 mg/dL  Hemoglobin A1c  Result Value Ref Range   Hgb A1c MFr Bld 6.0 (H) 4.8 - 5.6 %   Est. average glucose Bld gHb Est-mCnc 126 mg/dL    Assessment & Plan     Problem List Items Addressed This Visit      Cardiovascular and Mediastinum   BP (high blood pressure) - Primary    Well controlled Continue current medications Recheck metabolic panel F/u in 6 months       Relevant Orders   Basic Metabolic Panel (BMET) (Completed)     Musculoskeletal and Integument   Low back strain    Continue current meds Referral to PT      Relevant Orders   Ambulatory referral to Physical Therapy     Other   Depression, recurrent (HCC)    Chronic and stable Continue lexapro and wellbutrin      Relevant Medications   escitalopram (LEXAPRO) 20 MG tablet   Morbid obesity ([place])    Discussed importance of healthy weight management Discussed diet and exercise       Pre-diabetes    Encourage low card diet Recheck A1c      Relevant Orders   Hemoglobin A1c (Completed)    Other Visit Diagnoses    BMI 45.0-49.9, adult (HCC)       Neck pain       Relevant Orders   Ambulatory referral to Physical Therapy   Encounter for immunization       Relevant Orders   Pfizer SARS-COV-2 Vaccine (  Completed)       Return in about 6 months (around 12/31/2020) for CPE.      I, Lavon Paganini, MD, have reviewed all documentation for this visit. The documentation on 07/04/20 for the exam, diagnosis, procedures,  and orders are all accurate and complete.   Bacigalupo, Dionne Bucy, MD, MPH [facility]

## 2020-07-03 NOTE — Patient Instructions (Signed)

## 2020-07-04 LAB — BASIC METABOLIC PANEL
BUN/Creatinine Ratio: 19 (ref 12–28)
BUN: 18 mg/dL (ref 8–27)
CO2: 23 mmol/L (ref 20–29)
Calcium: 9 mg/dL (ref 8.7–10.3)
Chloride: 105 mmol/L (ref 96–106)
Creatinine, Ser: 0.95 mg/dL (ref 0.57–1.00)
GFR calc Af Amer: 75 mL/min/{1.73_m2} (ref >59)
GFR calc non Af Amer: 65 mL/min/{1.73_m2} (ref >59)
Glucose: 99 mg/dL (ref 65–99)
Potassium: 3.9 mmol/L (ref 3.5–5.2)
Sodium: 140 mmol/L (ref 134–144)

## 2020-07-04 LAB — HEMOGLOBIN A1C
Est. average glucose Bld gHb Est-mCnc: 126 mg/dL
Hgb A1c MFr Bld: 6 % — ABNORMAL HIGH (ref 4.8–5.6)

## 2020-07-04 NOTE — Assessment & Plan Note (Signed)
Well controlled Continue current medications Recheck metabolic panel F/u in 6 months  

## 2020-07-04 NOTE — Assessment & Plan Note (Signed)
Chronic and stable Continue lexapro and wellbutrin

## 2020-07-04 NOTE — Assessment & Plan Note (Signed)
Discussed importance of healthy weight management Discussed diet and exercise  

## 2020-07-04 NOTE — Assessment & Plan Note (Signed)
Encourage low card diet Recheck A1c

## 2020-07-04 NOTE — Assessment & Plan Note (Signed)
Continue current meds Referral to PT

## 2020-07-17 DIAGNOSIS — G43019 Migraine without aura, intractable, without status migrainosus: Secondary | ICD-10-CM | POA: Diagnosis not present

## 2020-07-17 DIAGNOSIS — M542 Cervicalgia: Secondary | ICD-10-CM | POA: Diagnosis not present

## 2020-07-17 DIAGNOSIS — G43839 Menstrual migraine, intractable, without status migrainosus: Secondary | ICD-10-CM | POA: Diagnosis not present

## 2020-07-17 DIAGNOSIS — G43719 Chronic migraine without aura, intractable, without status migrainosus: Secondary | ICD-10-CM | POA: Diagnosis not present

## 2020-07-30 DIAGNOSIS — M542 Cervicalgia: Secondary | ICD-10-CM | POA: Diagnosis not present

## 2020-07-30 DIAGNOSIS — G43839 Menstrual migraine, intractable, without status migrainosus: Secondary | ICD-10-CM | POA: Diagnosis not present

## 2020-07-30 DIAGNOSIS — G43719 Chronic migraine without aura, intractable, without status migrainosus: Secondary | ICD-10-CM | POA: Diagnosis not present

## 2020-07-30 DIAGNOSIS — G43019 Migraine without aura, intractable, without status migrainosus: Secondary | ICD-10-CM | POA: Diagnosis not present

## 2020-09-03 DIAGNOSIS — G43839 Menstrual migraine, intractable, without status migrainosus: Secondary | ICD-10-CM | POA: Diagnosis not present

## 2020-09-03 DIAGNOSIS — G43719 Chronic migraine without aura, intractable, without status migrainosus: Secondary | ICD-10-CM | POA: Diagnosis not present

## 2020-09-03 DIAGNOSIS — G43019 Migraine without aura, intractable, without status migrainosus: Secondary | ICD-10-CM | POA: Diagnosis not present

## 2020-09-03 DIAGNOSIS — M542 Cervicalgia: Secondary | ICD-10-CM | POA: Diagnosis not present

## 2020-09-11 ENCOUNTER — Ambulatory Visit: Payer: BC Managed Care – PPO | Admitting: Podiatry

## 2020-09-17 ENCOUNTER — Ambulatory Visit: Payer: Self-pay | Admitting: *Deleted

## 2020-09-17 NOTE — Telephone Encounter (Signed)
C/o "falling asleep all of the time". Patient reports she can not stay awake and she is getting at least 6-7 hours of sleep every night and wears her CPAP as directed. Fatigue is noted and she tries to fight from falling asleep after meals and during the day. Episodes of sleepiness come throughout the day. "feels like I'm drugged" and interfere with ADLs. Denies chest pain, difficulty breathing, weakness on one side, no blood in stool. C/o some shortness of breath at times with wheezing but not now. C/o feeling lightheaded when standing or laying flat with episodes of vertigo.patient reports she is drinking enough fluids. Earliest appt scheduled for 09/29/20. Please advise for earlier appt or if patient recommended to go to ED. Care advise given. Patient verbalized understanding of care advise and to call back or go to ED if symptoms worsen.   Reason for Disposition . [1] MODERATE weakness (i.e., interferes with work, school, normal activities) AND [2] cause unknown  (Exceptions: weakness with acute minor illness, or weakness from poor fluid intake)  Answer Assessment - Initial Assessment Questions 1. DESCRIPTION: "Describe how you are feeling."     C/o multiple issues of "falling asleep" and fatigue throughout the day. Vertigo, wheezing and shortness of breath at times. 2. SEVERITY: "How bad is it?"  "Can you stand and walk?"   - MILD - Feels weak or tired, but does not interfere with work, school or normal activities   - [place] to stand and walk; weakness interferes with work, school, or normal activities   - SEVERE - Unable to stand or walk     moderate 3. ONSET:  "When did the weakness begin?"     "for a while now" 4. CAUSE: "What do you think is causing the weakness?"     Not sure  5. MEDICINES: "Have you recently started a new medicine or had a change in the amount of a medicine?"     Patient says no. Multiple prescribed medications can cause drowsiness. 6. OTHER SYMPTOMS: "Do you  have any other symptoms?" (e.g., chest pain, fever, cough, SOB, vomiting, diarrhea, bleeding, other areas of pain)     C/o vertigo and shortness of breath with wheezing at times..  7. PREGNANCY: "Is there any chance you are pregnant?" "When was your last menstrual period?"     no  Protocols used: WEAKNESS (GENERALIZED) AND FATIGUE-A-AH

## 2020-09-17 NOTE — Telephone Encounter (Signed)
See if anyone has available appts this week. May need to consider urgent care

## 2020-09-24 NOTE — Telephone Encounter (Signed)
Patient called agent to see if there were any sooner appointments- there are none available at this time.  Per agent request: Reached out to patient- patient states her symptoms are the same- they have not changed. Advised patient per PCP recommendation- she states she wants to wait for appointment- declines UC disposition at this time- advised patient to be seen for any changes in the symptoms she is experiencing.

## 2020-09-26 NOTE — Progress Notes (Signed)
Established patient visit   Patient: Rachael Brewer   DOB: 20-Sep-1960   60 y.o. Female  MRN: [id number] Visit Date: 09/29/2020  Today's healthcare provider: Wilhemena Durie, MD   Chief Complaint  Patient presents with  . Fatigue   Subjective    HPI  Patient presents today C/o "falling asleep all of the time". Patient reports she can not stay awake and she is getting at least 6-7 hours of sleep every night and wears her CPAP as directed. Fatigue is noted and she tries to fight from falling asleep after meals and during the day. Episodes of sleepiness come throughout the day. "feels like I'm drugged" and interfere with ADLs. Denies chest pain, difficulty breathing, weakness on one side, no blood in stool. C/o some shortness of breath at times with wheezing but not now. C/o feeling lightheaded when standing or laying flat with episodes of vertigo. Patient reports she is drinking enough fluids. Patient reports that she has had symptoms about 1 month.   No acute event identified.      Medications: Outpatient Medications Prior to Visit  Medication Sig  . albuterol (VENTOLIN HFA) 108 (90 Base) MCG/ACT inhaler Inhale 1-2 puffs into the lungs every 6 (six) hours as needed for wheezing or shortness of breath.  Marland Kitchen aspirin-acetaminophen-caffeine (EXCEDRIN MIGRAINE) 250-250-65 MG tablet Take 2 tablets by mouth every 8 (eight) hours as needed for migraine.   . Azelastine HCl 137 MCG/SPRAY SOLN Place 1 spray into both nostrils at bedtime.  . baclofen (LIORESAL) 10 MG tablet Take 1 tablet (10 mg total) by mouth 2 (two) times daily as needed for muscle spasms.  Marland Kitchen buPROPion (WELLBUTRIN) 100 MG tablet TAKE 1 TABLET BY MOUTH TWICE A DAY  . escitalopram (LEXAPRO) 20 MG tablet Take 1 tablet (20 mg total) by mouth daily.  Marland Kitchen gabapentin (NEURONTIN) 600 MG tablet Take 1,200 mg by mouth at bedtime.   . Multiple Vitamin (MULTIVITAMIN WITH MINERALS) TABS tablet Take 1 tablet by mouth daily.  Marland Kitchen  omeprazole (PRILOSEC) 40 MG capsule Take 1 capsule (40 mg total) by mouth daily.  . Potassium Chloride 40 MEQ/15ML (20%) SOLN Take 40 mEq by mouth daily.  . rizatriptan (MAXALT) 10 MG tablet Take 10 mg by mouth every 2 (two) hours as needed for migraine (max 2 tablets/24 hrs.).   Marland Kitchen topiramate ER (QUDEXY XR) 150 MG CS24 sprinkle capsule Take 150 mg by mouth at bedtime.   . torsemide (DEMADEX) 20 MG tablet Take 2 tablets (40 mg total) by mouth daily.   No facility-administered medications prior to visit.    Review of Systems  Constitutional: Positive for activity change and fatigue.  Respiratory: Positive for cough. Negative for shortness of breath.   Cardiovascular: Negative for chest pain, palpitations and leg swelling.  Gastrointestinal: Negative.   Endocrine: Negative for cold intolerance, heat intolerance, polydipsia, polyphagia and polyuria.  Genitourinary: Negative.   Musculoskeletal: Positive for arthralgias, myalgias, neck pain and neck stiffness. Negative for back pain, gait problem and joint swelling.  Neurological: Positive for dizziness, weakness and headaches.  Psychiatric/Behavioral: Positive for sleep disturbance. Negative for agitation, decreased concentration, self-injury and suicidal ideas. The patient is not nervous/anxious and is not hyperactive.         Objective    BP 130/66   Pulse 67   Temp 98.6 F (37 C)   Resp 16   Ht 5\' 3"  (1.6 m)   Wt 259 lb (117.5 kg)   LMP  (LMP  Unknown)   BMI 45.88 kg/m  BP Readings from Last 3 Encounters:  09/29/20 130/66  07/03/20 136/70  02/18/20 103/64   Wt Readings from Last 3 Encounters:  09/29/20 259 lb (117.5 kg)  07/03/20 257 lb (116.6 kg)  02/18/20 248 lb (112.5 kg)       Physical Exam Vitals reviewed.  Constitutional:      General: She is not in acute distress.    Appearance: Normal appearance. She is well-developed. She is not diaphoretic.  HENT:     Head: Normocephalic and atraumatic.  Eyes:     General:  No scleral icterus.    Conjunctiva/sclera: Conjunctivae normal.  Neck:     Thyroid: No thyromegaly.  Cardiovascular:     Rate and Rhythm: Normal rate and regular rhythm.     Pulses: Normal pulses.     Heart sounds: Normal heart sounds. No murmur heard.   Pulmonary:     Effort: Pulmonary effort is normal. No respiratory distress.     Breath sounds: Normal breath sounds. No wheezing, rhonchi or rales.  Musculoskeletal:     Cervical back: Neck supple.     Right lower leg: No edema.     Left lower leg: No edema.  Lymphadenopathy:     Cervical: No cervical adenopathy.  Skin:    General: Skin is warm and dry.     Findings: No rash.  Neurological:     General: No focal deficit present.     Mental Status: She is alert and oriented to person, place, and time. Mental status is at baseline.  Psychiatric:        Mood and Affect: Mood normal.        Behavior: Behavior normal.        Thought Content: Thought content normal.     ECG reveals normal sinus rhythm with no ischemic changes.  Heart rate of 66  No results found for any visits on 09/29/20.  Assessment & Plan     1. Dizziness No obvious etiology.  Normal exam. - EKG 12-Lead - Sedimentation rate - CK - Ambulatory referral to Neurology  2. Fatigue,/weakness unspecified type  - Comprehensive metabolic panel - TSH - K02 and Folate Panel  3. Depression, recurrent ([place]) On Lexapro.  4. Morbid obesity ([place])  - CBC with Differential/Platelet - Hemoglobin A1c  5. Left leg cellulitis Clinically resolved  6. Pre-diabetes  7.OSA  No follow-ups on file.      I, Wilhemena Durie, MD, have reviewed all documentation for this visit. The documentation on 10/04/20 for the exam, diagnosis, procedures, and orders are all accurate and complete.    Jalea Bronaugh Cranford Mon, MD  [facility] [phone] (phone) [phone] (fax)  [place]

## 2020-09-29 ENCOUNTER — Encounter: Payer: Self-pay | Admitting: Family Medicine

## 2020-09-29 ENCOUNTER — Ambulatory Visit: Payer: BC Managed Care – PPO | Admitting: Family Medicine

## 2020-09-29 ENCOUNTER — Other Ambulatory Visit: Payer: Self-pay

## 2020-09-29 VITALS — BP 130/66 | HR 67 | Temp 98.6°F | Resp 16 | Ht 63.0 in | Wt 259.0 lb

## 2020-09-29 DIAGNOSIS — I10 Essential (primary) hypertension: Secondary | ICD-10-CM | POA: Diagnosis not present

## 2020-09-29 DIAGNOSIS — R42 Dizziness and giddiness: Secondary | ICD-10-CM | POA: Diagnosis not present

## 2020-09-29 DIAGNOSIS — L03116 Cellulitis of left lower limb: Secondary | ICD-10-CM | POA: Diagnosis not present

## 2020-09-29 DIAGNOSIS — F339 Major depressive disorder, recurrent, unspecified: Secondary | ICD-10-CM | POA: Diagnosis not present

## 2020-09-29 DIAGNOSIS — R5383 Other fatigue: Secondary | ICD-10-CM | POA: Diagnosis not present

## 2020-09-29 DIAGNOSIS — R7303 Prediabetes: Secondary | ICD-10-CM | POA: Diagnosis not present

## 2020-09-30 LAB — HEMOGLOBIN A1C
Est. average glucose Bld gHb Est-mCnc: 131 mg/dL
Hgb A1c MFr Bld: 6.2 % — ABNORMAL HIGH (ref 4.8–5.6)

## 2020-09-30 LAB — CBC WITH DIFFERENTIAL/PLATELET
Basophils Absolute: 0.1 10*3/uL (ref 0.0–0.2)
Basos: 1 %
EOS (ABSOLUTE): 0.3 10*3/uL (ref 0.0–0.4)
Eos: 5 %
Hematocrit: 47 % — ABNORMAL HIGH (ref 34.0–46.6)
Hemoglobin: 15.9 g/dL (ref 11.1–15.9)
Immature Grans (Abs): 0 10*3/uL (ref 0.0–0.1)
Immature Granulocytes: 0 %
Lymphocytes Absolute: 2.3 10*3/uL (ref 0.7–3.1)
Lymphs: 34 %
MCH: 31.2 pg (ref 26.6–33.0)
MCHC: 33.8 g/dL (ref 31.5–35.7)
MCV: 92 fL (ref 79–97)
Monocytes Absolute: 0.8 10*3/uL (ref 0.1–0.9)
Monocytes: 12 %
Neutrophils Absolute: 3.3 10*3/uL (ref 1.4–7.0)
Neutrophils: 48 %
Platelets: 269 10*3/uL (ref 150–450)
RBC: 5.09 x10E6/uL (ref 3.77–5.28)
RDW: 12.8 % (ref 11.7–15.4)
WBC: 6.8 10*3/uL (ref 3.4–10.8)

## 2020-09-30 LAB — CK: Total CK: 276 U/L — ABNORMAL HIGH (ref 32–182)

## 2020-09-30 LAB — COMPREHENSIVE METABOLIC PANEL
ALT: 23 IU/L (ref 0–32)
AST: 28 IU/L (ref 0–40)
Albumin/Globulin Ratio: 1.7 (ref 1.2–2.2)
Albumin: 4.3 g/dL (ref 3.8–4.8)
Alkaline Phosphatase: 77 IU/L (ref 44–121)
BUN/Creatinine Ratio: 15 (ref 12–28)
BUN: 16 mg/dL (ref 8–27)
Bilirubin Total: 0.3 mg/dL (ref 0.0–1.2)
CO2: 20 mmol/L (ref 20–29)
Calcium: 9.3 mg/dL (ref 8.7–10.3)
Chloride: 103 mmol/L (ref 96–106)
Creatinine, Ser: 1.1 mg/dL — ABNORMAL HIGH (ref 0.57–1.00)
Globulin, Total: 2.6 g/dL (ref 1.5–4.5)
Glucose: 126 mg/dL — ABNORMAL HIGH (ref 65–99)
Potassium: 3.7 mmol/L (ref 3.5–5.2)
Sodium: 144 mmol/L (ref 134–144)
Total Protein: 6.9 g/dL (ref 6.0–8.5)
eGFR: 57 mL/min/{1.73_m2} — ABNORMAL LOW (ref >59)

## 2020-09-30 LAB — B12 AND FOLATE PANEL
Folate: 17.6 ng/mL (ref >3.0)
Vitamin B-12: 719 pg/mL (ref 232–1245)

## 2020-09-30 LAB — TSH: TSH: 2.39 u[IU]/mL (ref 0.450–4.500)

## 2020-09-30 LAB — SEDIMENTATION RATE: Sed Rate: 2 mm/hr (ref 0–40)

## 2020-10-02 ENCOUNTER — Telehealth: Payer: Self-pay

## 2020-10-02 DIAGNOSIS — R42 Dizziness and giddiness: Secondary | ICD-10-CM

## 2020-10-02 DIAGNOSIS — R5383 Other fatigue: Secondary | ICD-10-CM

## 2020-10-02 NOTE — Telephone Encounter (Signed)
Copied from [place] [id number]. Topic: Referral - Status >> Oct 02, 2020 12:49 PM Scherrie Gerlach wrote: Reason for CRM: ariana with [facility] would like to know if this referral is for sleep issues as well?

## 2020-10-02 NOTE — Telephone Encounter (Signed)
-----   Message from Jerrol Banana., MD sent at 10/02/2020 10:37 AM EDT ----- Refer to [facility] neurology, Dr. Brett Fairy, sleep medicine

## 2020-10-02 NOTE — Telephone Encounter (Signed)
Dr. Rosanna Randy, I cant remember if you wanted the neuro referral to discuss sleep issues as well as dizziness? Please advise. Thanks!

## 2020-10-04 NOTE — Telephone Encounter (Signed)
yes

## 2020-10-14 ENCOUNTER — Ambulatory Visit: Payer: BC Managed Care – PPO | Admitting: Neurology

## 2020-10-14 ENCOUNTER — Encounter: Payer: Self-pay | Admitting: Neurology

## 2020-10-14 VITALS — BP 122/78 | HR 70 | Ht 63.0 in | Wt 253.3 lb

## 2020-10-14 DIAGNOSIS — Z8669 Personal history of other diseases of the nervous system and sense organs: Secondary | ICD-10-CM | POA: Diagnosis not present

## 2020-10-14 DIAGNOSIS — R519 Headache, unspecified: Secondary | ICD-10-CM

## 2020-10-14 DIAGNOSIS — R635 Abnormal weight gain: Secondary | ICD-10-CM | POA: Diagnosis not present

## 2020-10-14 DIAGNOSIS — R351 Nocturia: Secondary | ICD-10-CM | POA: Diagnosis not present

## 2020-10-14 DIAGNOSIS — H81399 Other peripheral vertigo, unspecified ear: Secondary | ICD-10-CM

## 2020-10-14 DIAGNOSIS — G4719 Other hypersomnia: Secondary | ICD-10-CM

## 2020-10-14 DIAGNOSIS — R42 Dizziness and giddiness: Secondary | ICD-10-CM

## 2020-10-14 NOTE — Progress Notes (Signed)
Subjective:    Patient ID: Rachael Brewer is a 60 y.o. female.  HPI     Star Age, MD, PhD [facility] [address], [address].O. [place], [place] [address]  Dear Dr. Rosanna Randy,   I saw your patient, Rachael Brewer, upon your kind request, in my Neurologic clinic today for initial consultation of her dizziness, lightheadedness and daytime somnolence.  The patient is unaccompanied today.  As you know, [name] is a 60 year old right-handed woman with an underlying medical history of arthritis, status post knee replacement, anxiety, depression, edema, history of cellulitis, history of sepsis with admission to the hospital in July 2021, Mnire's disease, prediabetes, sleep apnea, on CPAP therapy, and severe obesity with a BMI of over 40, who reports an approximately 3-week history of positional dizziness and also lightheadedness upon standing.  Her positional dizziness is not often a spinning sensation but a sense of off balance.  It happens primarily when she bends forward to pick up something or do gardening and then stands up.  She reports a diagnosis of Mnire's disease but has not seen her ENT specialist in about 2 years.  She has hearing aids, had them for the past 2 years.  She has had intermittent spinning sensation when she changes position while lying down.  She denies any ringing in the ears currently. She has not had any physical therapy recently for the symptoms.  Symptoms started fairly abruptly.  She admits that she has not really been adhering to a low-salt diet as she had been advised to do in the past.  She has not fallen.  She has not had any significant migraine headaches, carries a diagnosis of migraines and has taken Maxalt as needed but often gets by with over-the-counter Excedrin.   I reviewed your office note from 09/29/2020.  She did not have any obvious abnormality on examination at the time.  She had blood work including CMP, CBC with  differential, TSH, ESR, CK level, A1c, B12 and folate.  I reviewed her blood test results from 09/29/2020.  Glucose was 126, BUN 16, creatinine 1.1, GFR 57.  Sodium 144, potassium 3.7, AST 28, ALT 23, CBC showed WBC of 6.8, hemoglobin 15.9, hematocrit mildly elevated at 47.  TSH was 2.39.  A1c was 6.2, in the prediabetes range.  Vitamin B12 719, folate 17.6, ESR 2, CK level 276, mildly elevated. In the past 6 weeks she has had increase in her daytime sleepiness.  She may nod off after meals.  She reports compliance with her CPAP.  A compliance download was not available for my review.  Prior sleep study results are not available for my review today.  She has not seen a sleep specialist in years, probably over 5 years.  She is not sure how severe her sleep apnea was but estimates that she has at least moderate sleep apnea.  She also reports significant weight gain over the past several years.  In the past 13 years she gained about 100 pounds.  She had recent bilateral knee replacement surgeries in 2020 and 2021 respectively.  She has lower extremity swelling and uses compression socks.  She quit smoking some 20 years ago.  She drinks caffeine in the form of coffee, 1 cup/day on average and drinks alcohol occasionally.  Bedtime is around 11 and rise time around 6.  She has nocturia about once per average night and has woken up with a headache occasionally.  She is married and lives  with her husband and 79 year old adoptive son.  She also has 2 step grandsons, who lives with them, ages 50 and 76.  Her Past Medical History Is Significant For: Past Medical History:  Diagnosis Date  . Anxiety   . Arthritis   . Complication of anesthesia    CAUSES MIGRAINE  . Depression   . Edema of both lower extremities   . GERD (gastroesophageal reflux disease)   . Migraines   . Pre-diabetes   . Sleep apnea    CPAP    Her Past Surgical History Is Significant For: Past Surgical History:  Procedure Laterality Date  .  ANTERIOR CRUCIATE LIGAMENT REPAIR Right 1996  . DILATION AND CURETTAGE OF UTERUS  2004  . FOOT SURGERY Bilateral   . JOINT REPLACEMENT Right   . KNEE ARTHROPLASTY Right 05/02/2019   Procedure: COMPUTER ASSISTED TOTAL KNEE ARTHROPLASTY;  Surgeon: Dereck Leep, MD;  Location: [facility] ORS;  Service: Orthopedics;  Laterality: Right;  . KNEE ARTHROPLASTY Left 07/09/2019   Procedure: COMPUTER ASSISTED TOTAL KNEE ARTHROPLASTY;  Surgeon: Dereck Leep, MD;  Location: [facility] ORS;  Service: Orthopedics;  Laterality: Left;  . KNEE SURGERY Right   . MENISCUS REPAIR Bilateral     Her Family History Is Significant For: Family History  Problem Relation Age of Onset  . Hypertension Brother   . Diabetes Brother   . Diabetes Other   . Hypertension Other   . Glaucoma Other   . Breast cancer Maternal Aunt        50's    Her Social History Is Significant For: Social History   Socioeconomic History  . Marital status: Married    Spouse name: Not on file  . Number of children: Not on file  . Years of education: Not on file  . Highest education level: Not on file  Occupational History  . Not on file  Tobacco Use  . Smoking status: Former Smoker    Packs/day: 1.00    Years: 15.00    Pack years: 15.00    Quit date: 06/21/1999    Years since quitting: 21.3  . Smokeless tobacco: Never Used  Vaping Use  . Vaping Use: Never used  Substance and Sexual Activity  . Alcohol use: Yes    Alcohol/week: 0.0 standard drinks    Comment: occasional  . Drug use: No  . Sexual activity: Yes    Birth control/protection: None  Other Topics Concern  . Not on file  Social History Narrative  . Not on file   Social Determinants of Health   Financial Resource Strain: Not on file  Food Insecurity: Not on file  Transportation Needs: Not on file  Physical Activity: Not on file  Stress: Not on file  Social Connections: Not on file    Her Allergies Are:  Allergies  Allergen Reactions  . Bactrim  [Sulfamethoxazole-Trimethoprim] Shortness Of Breath and Swelling  . Sulfa Antibiotics Anaphylaxis  . Sumatriptan     Made migraine more intense and scalp felt like it was on fire  . Penicillins Hives    Did it involve swelling of the face/tongue/throat, SOB, or low BP? No Did it involve sudden or severe rash/hives, skin peeling, or any reaction on the inside of your mouth or nose? No Did you need to seek medical attention at a hospital or doctor's office? No When did it last happen?10 years ago If all above answers are "NO", may proceed with cephalosporin use.   :   Her Current  Medications Are:  Outpatient Encounter Medications as of 10/14/2020  Medication Sig  . albuterol (VENTOLIN HFA) 108 (90 Base) MCG/ACT inhaler Inhale 1-2 puffs into the lungs every 6 (six) hours as needed for wheezing or shortness of breath.  Marland Kitchen aspirin-acetaminophen-caffeine (EXCEDRIN MIGRAINE) 250-250-65 MG tablet Take 2 tablets by mouth every 8 (eight) hours as needed for migraine.   . Azelastine HCl 137 MCG/SPRAY SOLN Place 1 spray into both nostrils at bedtime.  . baclofen (LIORESAL) 10 MG tablet Take 1 tablet (10 mg total) by mouth 2 (two) times daily as needed for muscle spasms.  Marland Kitchen buPROPion (WELLBUTRIN) 100 MG tablet TAKE 1 TABLET BY MOUTH TWICE A DAY  . escitalopram (LEXAPRO) 20 MG tablet Take 1 tablet (20 mg total) by mouth daily.  Marland Kitchen gabapentin (NEURONTIN) 600 MG tablet Take 1,200 mg by mouth at bedtime.   . Multiple Vitamin (MULTIVITAMIN WITH MINERALS) TABS tablet Take 1 tablet by mouth daily.  Marland Kitchen omeprazole (PRILOSEC) 40 MG capsule Take 1 capsule (40 mg total) by mouth daily.  . Potassium Chloride 40 MEQ/15ML (20%) SOLN Take 40 mEq by mouth daily.  . rizatriptan (MAXALT) 10 MG tablet Take 10 mg by mouth every 2 (two) hours as needed for migraine (max 2 tablets/24 hrs.).   Marland Kitchen topiramate ER (QUDEXY XR) 150 MG CS24 sprinkle capsule Take 150 mg by mouth at bedtime.   . torsemide (DEMADEX) 20 MG tablet Take  2 tablets (40 mg total) by mouth daily.   No facility-administered encounter medications on file as of 10/14/2020.  :   Review of Systems:  Out of a complete 14 point review of systems, all are reviewed and negative with the exception of these symptoms as listed below:  Review of Systems  Neurological:       Here to f/u on worsening vertigo sx. Pt reports the sx have been present for 3 weeks now. She also reports a few weeks after, her husband started having similar sx. Dizziness is more pronounced when she moves her head down and up. Also reports increased fatigue.     Objective:  Neurological Exam  Physical Exam Physical Examination:   Vitals:   10/14/20 1454  BP: 122/78  Pulse: 70   General Examination: The patient is a very pleasant 60 y.o. female in no acute distress. She appears well-developed and well-nourished and well groomed.  Orthostatic testing shows a standing blood pressure of 129/76 with a heart rate of 73, no orthostatic hypotension, lightheadedness or vertiginous symptoms.  HEENT: Normocephalic, atraumatic, pupils are equal, round and reactive to light.  Extraocular tracking is well-preserved, no nystagmus noted.  She does not have any vertiginous symptoms upon changes in head and neck position.  She denies any orthostatic lightheadedness.  She has bilateral hearing aids.  She has corrective eyeglasses.  Tympanic membranes are clear bilaterally.  No carotid bruits.  Airway examination reveals moderate mouth dryness, tonsils absent, small airway entry.  Tongue protrudes centrally and palate elevates symmetrically, neck circumference of 16 three-quarter inches.  Chest: Clear to auscultation without wheezing, rhonchi or crackles noted.  Heart: S1+S2+0, regular and normal without murmurs, rubs or gallops noted.   Abdomen: Soft, non-tender and non-distended with normal bowel sounds appreciated on auscultation.  Extremities: There is trace pitting edema in the distal  lower extremities bilaterally.  She is wearing compression stockings up to the knees bilaterally.   Skin: Warm and dry without trophic changes noted.  Musculoskeletal: exam reveals unremarkable knee replacement scars bilaterally.  Neurologically:  Mental status: The patient is awake, alert and oriented in all 4 spheres. Her immediate and remote memory, attention, language skills and fund of knowledge are appropriate. There is no evidence of aphasia, agnosia, apraxia or anomia. Speech is clear with normal prosody and enunciation. Thought process is linear. Mood is normal and affect is normal.  Cranial nerves II - XII are as described above under HEENT exam. In addition: shoulder shrug is normal with equal shoulder height noted. Motor exam: Normal bulk, strength and tone is noted. There is no drift, tremor or rebound. Romberg is negative. Reflexes are 1+ in the upper extremities and absent in the lower extremities.  Fine motor skills and coordination: intact with normal finger taps, normal hand movements, normal rapid alternating patting, normal foot taps and normal foot agility.  Cerebellar testing: No dysmetria or intention tremor on finger to nose testing. Heel to shin is unremarkable bilaterally. There is no truncal or gait ataxia.  Sensory exam: intact to light touch in the upper and lower extremities.  Gait, station and balance: She stands easily. No veering to one side is noted. No leaning to one side is noted. Posture is age-appropriate and stance is narrow based. Gait shows normal stride length and normal pace. No problems turning are noted.  No walking aid, preserved arm swing bilaterally, no limp noted.  No shuffling.             Assessment and Plan:   In summary, MAVIS GRAVELLE is a very pleasant 61 y.o.-year old femalewith an underlying medical history of arthritis, status post knee replacement, anxiety, depression, edema, history of cellulitis, history of sepsis with admission to the  hospital in July 2021, Mnire's disease, prediabetes, sleep apnea, on CPAP therapy, and severe obesity with a BMI of over 40, who presents for evaluation of her positional dizziness over the past 3 weeks as well as increasing somnolence during the day for the past 6 weeks.  Examination shows no obvious or telltale signs of vertigo, no orthostatic hypotension.  She has a history of Mnire's disease and admits to not adhering to a low-salt diet.  She has not had a checkup with her ENT in over 2 years.  She is willing to make an appointment with ENT to evaluate for Mnire's flareup.  She has had hearing aids for the past 2 years but reports a longstanding history of hearing loss.  She has a diagnosis of sleep apnea, she has not had a recheck in several years, well over 5 years and is agreeable to getting rechecked for sleep apnea.  Epworth sleepiness score is 18 out of 24 today.  Of note, she is on several potentially sedating medications which includes topiramate, gabapentin, Lexapro, baclofen.  She has gained a significant amount of weight in the past years, she reports a 100 pound weight gain in the past 13 years.  We will try to get a compliance data report from her DME company.  Her neurological exam is nonfocal.  She is reassured in that regard.  Nevertheless, given her recent positional dizziness, I would like to rule out a structural cause of her symptoms by doing a brain MRI with and without contrast.  We will proceed with a sleep study and a brain MRI and keep her posted as to her test results by phone call.  She is advised to make an appointment with her ENT and she is advised that positional vertigo can respond to physical therapy, she is advised  to talk to your office about a referral if ENT checkup goes well.  We talked about the importance of treating sleep apnea and adjusting treatment over time if the need arises.  She is encouraged to continue to work on weight loss.   We will plan a follow-up  after testing and keep her posted as to her test results by phone call in the interim.  I answered all her questions today and she was in agreement.  Thank you very much for allowing me to participate in the care of this nice patient. If I can be of any further assistance to you please do not hesitate to call me at [phone].  Sincerely,   Star Age, MD, PhD

## 2020-10-14 NOTE — Patient Instructions (Signed)
Unfortunately, dizziness is a very common complaint but is often not due to a primary neurological reason or single underlying medical problem. Often, there a combination of factors, that result in dizziness. This includes blood pressure fluctuations, medication side effects, blood sugar fluctuations, stress, vertigo, poor sleep with sleep deprivation, dehydration, and electrolyte disturbance or other metabolic and endocrinological reasons, meaning hormone related problems such as thyroid dysfunction. We will investigate things further with a brain MRI, to rule out a structural cause of symptoms.  We can consider physical therapy, we will first call you with the results of your MRI.  In addition, I recommend that you follow-up with your ENT physician for your Mnire's disease as the vertigo could also be a flareup of your Mnire's.  We will try to get records of your CPAP usage.  We will reevaluate your sleep apnea as you have not had a recheck in several years and you have gained quite a bit of weight over time.  We will keep you posted as to your sleep study results, you should qualify for a new machine as well.

## 2020-10-15 ENCOUNTER — Telehealth: Payer: Self-pay | Admitting: Neurology

## 2020-10-15 NOTE — Telephone Encounter (Signed)
[employer] auth: [employer] spoke to [place] Ref # [id number]. Patient is scheduled at [facility] for 10/27/20 to arrive at 10:30 AM. Patient is aware of time & day. I also gave her their number of [phone] incase she needed to r/s.

## 2020-10-17 DIAGNOSIS — H8111 Benign paroxysmal vertigo, right ear: Secondary | ICD-10-CM | POA: Diagnosis not present

## 2020-10-17 DIAGNOSIS — J301 Allergic rhinitis due to pollen: Secondary | ICD-10-CM | POA: Diagnosis not present

## 2020-10-21 ENCOUNTER — Telehealth: Payer: Self-pay | Admitting: Neurology

## 2020-10-21 NOTE — Telephone Encounter (Signed)
I reviewed patient's CPAP compliance data from 09/17/2020 through 10/16/2020, which is a total of 30 days, during which time she used her machine only 15 days with percent use days greater than 4 hours at 23%, indicating suboptimal compliance with an average usage for days on treatment of 4 hours and 2 minutes.  Residual AHI at goal at 2.7/h, leak acceptable with a 95th percentile at 5.8 L/min on a pressure of 8 cm without EPR.  Megan: Please call patient back and advise her that she has not been very consistent in the past month with her CPAP.  When she uses it, it seems to take care of her sleep apnea well enough for her apnea score to be below 5/h on average.  I would like for her to be consistent with her CPAP therapy.  Set up date from what we looked up online was 04/05/2016.  She may not quite be eligible for a new machine as typically insurances require more than 5 years after set up date.  We can certainly pursue the home sleep test for reevaluation but she may not be eligible for a new machine until after 04/05/2021.  I think with more consistent CPAP usage her sleepiness will improve, it may also help her dizziness in fact.

## 2020-10-22 NOTE — Telephone Encounter (Signed)
I called the pt and discussed Dr. Guadelupe Sabin recommendation. Pt was agreeable with trying to be more consistent with her CPAP and keeping settings the same. Pt is also agreeable to having HST closer to her 5 year mark.  She is scheduled for HST on 11/09/20, will verify with Dr. Rexene Alberts if this should be c/a.  Pt will proceed with MRI as planned for 10/27/20.

## 2020-10-22 NOTE — Telephone Encounter (Signed)
HST for 11/12/2020 has been cancelled

## 2020-10-22 NOTE — Telephone Encounter (Signed)
Pt notified HST has been c/a and we will be in touch once we have MRI results.

## 2020-10-22 NOTE — Telephone Encounter (Signed)
Please cancel home sleep test, we can revisit later on.  We will keep her posted as to her MRI report after results are in.

## 2020-10-27 ENCOUNTER — Ambulatory Visit
Admission: RE | Admit: 2020-10-27 | Discharge: 2020-10-27 | Disposition: A | Discharge status: Home / Self Care | Payer: BC Managed Care – PPO | Source: Ambulatory Visit | Attending: Neurology | Admitting: Neurology

## 2020-10-27 ENCOUNTER — Other Ambulatory Visit: Payer: Self-pay

## 2020-10-27 DIAGNOSIS — G4719 Other hypersomnia: Secondary | ICD-10-CM | POA: Insufficient documentation

## 2020-10-27 DIAGNOSIS — R519 Headache, unspecified: Secondary | ICD-10-CM | POA: Diagnosis not present

## 2020-10-27 DIAGNOSIS — H81399 Other peripheral vertigo, unspecified ear: Secondary | ICD-10-CM | POA: Insufficient documentation

## 2020-10-27 DIAGNOSIS — R351 Nocturia: Secondary | ICD-10-CM | POA: Diagnosis not present

## 2020-10-27 DIAGNOSIS — R5383 Other fatigue: Secondary | ICD-10-CM | POA: Diagnosis not present

## 2020-10-27 DIAGNOSIS — Z8669 Personal history of other diseases of the nervous system and sense organs: Secondary | ICD-10-CM

## 2020-10-27 DIAGNOSIS — R635 Abnormal weight gain: Secondary | ICD-10-CM | POA: Diagnosis not present

## 2020-10-27 DIAGNOSIS — R42 Dizziness and giddiness: Secondary | ICD-10-CM | POA: Diagnosis not present

## 2020-10-27 DIAGNOSIS — J019 Acute sinusitis, unspecified: Secondary | ICD-10-CM | POA: Diagnosis not present

## 2020-10-27 MED ORDER — GADOBUTROL 1 MMOL/ML IV SOLN
10.0000 mL | Freq: Once | INTRAVENOUS | Status: AC | PRN
  Administered 2020-10-27: 10 mL via INTRAVENOUS

## 2020-10-27 NOTE — Progress Notes (Signed)
Call patient and advise her that her recent brain MRI with and without contrast showed no acute findings, no abnormal contrast uptake.  No obvious structural cause of her dizziness.  As advised, I recommend that she follow-up with her ENT specialist.  We will proceed with sleep study testing, as discussed.

## 2020-10-28 ENCOUNTER — Telehealth: Payer: Self-pay

## 2020-10-28 NOTE — Telephone Encounter (Signed)
I called pt and advised of result. Pt verbalized understanding and will f.u in OCT to start process for HST since machine currently is not quite 60 years old.

## 2020-10-28 NOTE — Telephone Encounter (Signed)
-----   Message from Rachael Age, MD sent at 10/27/2020  5:24 PM EDT ----- Call patient and advise her that her recent brain MRI with and without contrast showed no acute findings, no abnormal contrast uptake.  No obvious structural cause of her dizziness.  As advised, I recommend that she follow-up with her ENT specialist.  We will proceed with sleep study testing, as discussed.

## 2020-11-13 ENCOUNTER — Other Ambulatory Visit: Payer: Self-pay | Admitting: Family Medicine

## 2020-11-13 DIAGNOSIS — Z1231 Encounter for screening mammogram for malignant neoplasm of breast: Secondary | ICD-10-CM

## 2020-11-19 ENCOUNTER — Other Ambulatory Visit: Payer: Self-pay | Admitting: Family Medicine

## 2020-11-19 DIAGNOSIS — Z1231 Encounter for screening mammogram for malignant neoplasm of breast: Secondary | ICD-10-CM

## 2020-11-21 ENCOUNTER — Ambulatory Visit
Admission: RE | Admit: 2020-11-21 | Discharge: 2020-11-21 | Disposition: A | Discharge status: Home / Self Care | Payer: BC Managed Care – PPO | Source: Ambulatory Visit | Attending: Family Medicine | Admitting: Family Medicine

## 2020-11-21 ENCOUNTER — Other Ambulatory Visit: Payer: Self-pay

## 2020-11-21 DIAGNOSIS — Z1231 Encounter for screening mammogram for malignant neoplasm of breast: Secondary | ICD-10-CM | POA: Diagnosis not present

## 2020-12-01 ENCOUNTER — Ambulatory Visit: Payer: Self-pay | Admitting: *Deleted

## 2020-12-01 ENCOUNTER — Telehealth: Payer: Self-pay

## 2020-12-01 NOTE — Telephone Encounter (Signed)
Patient called in say that her whole family was sick got better now she is sick very loud breathing, cough raspy voice say that this would normally turn into Bronchitis needing some help please. Can be reached at   Ph#  [phone]  Call to patient- patient states she has cough, sore throat, chest congestion, fatigue, SOB-with wheezing. Patient states she has not tested for COVID- but will do home test. Patient advised needs to be seen- no open appointment in office- advised UC. Patient states she will go. Reason for Disposition  MILD difficulty breathing (e.g., minimal/no SOB at rest, SOB with walking, pulse <100)  Answer Assessment - Initial Assessment Questions 1. COVID-19 DIAGNOSIS: "Who made your COVID-19 diagnosis?" "Was it confirmed by a positive lab test or self-test?" If not diagnosed by a doctor (or NP/PA), ask "Are there lots of cases (community spread) where you live?" Note: See public health department website, if unsure.     No diagnosis-will do home test 2. COVID-19 EXPOSURE: "Was there any known exposure to COVID before the symptoms began?" CDC Definition of close contact: within 6 feet (2 meters) for a total of 15 minutes or more over a 24-hour period.      No exposure- family members were negative 3. ONSET: "When did the COVID-19 symptoms start?"       3-4 days ago- sore throat- then congestion 4. WORST SYMPTOM: "What is your worst symptom?" (e.g., cough, fever, shortness of breath, muscle aches)     SOB- rattling/chest congestion 5. COUGH: "Do you have a cough?" If Yes, ask: "How bad is the cough?"       Yes- sputum little- white/yellow 6. FEVER: "Do you have a fever?" If Yes, ask: "What is your temperature, how was it measured, and when did it start?"     no 7. RESPIRATORY STATUS: "Describe your breathing?" (e.g., shortness of breath, wheezing, unable to speak)      Some SOB at times- breathing is not normal 8. BETTER-SAME-WORSE: "Are you getting better, staying the same or  getting worse compared to yesterday?"  If getting worse, ask, "In what way?"     Worse- increased symptoms 9. HIGH RISK DISEASE: "Do you have any chronic medical problems?" (e.g., asthma, heart or lung disease, weak immune system, obesity, etc.)     Age, chronic bronchitis  10. VACCINE: "Have you had the COVID-19 vaccine?" If Yes, ask: "Which one, how many shots, when did you get it?"       Yes- Pfizer  11. BOOSTER: "Have you received your COVID-19 booster?" If Yes, ask: "Which one and when did you get it?"       No 12. PREGNANCY: "Is there any chance you are pregnant?" "When was your last menstrual period?"       N/a 13. OTHER SYMPTOMS: "Do you have any other symptoms?"  (e.g., chills, fatigue, headache, loss of smell or taste, muscle pain, sore throat)       Nasal congestion, fatigue 14. O2 SATURATION MONITOR:  "Do you use an oxygen saturation monitor (pulse oximeter) at home?" If Yes, ask "What is your reading (oxygen level) today?" "What is your usual oxygen saturation reading?" (e.g., 95%)       97%  Protocols used: Coronavirus (COVID-19) Diagnosed or Suspected-A-AH

## 2020-12-01 NOTE — Telephone Encounter (Signed)
Copied from [place] [phone]. Topic: General - Other >> Dec 01, 2020  2:07 PM Leward Quan A wrote: Reason for CRM: Patient called in say that her whole family was sick got better now she is sick very loud breathing, cough raspy voice say that this would normally turn into Bronchitis needing some help please. Can be reached at   Ph#  [phone]

## 2020-12-01 NOTE — Telephone Encounter (Signed)
Patient advised to go to urgent care.

## 2020-12-02 DIAGNOSIS — R059 Cough, unspecified: Secondary | ICD-10-CM | POA: Diagnosis not present

## 2020-12-02 DIAGNOSIS — J329 Chronic sinusitis, unspecified: Secondary | ICD-10-CM | POA: Diagnosis not present

## 2020-12-02 DIAGNOSIS — J40 Bronchitis, not specified as acute or chronic: Secondary | ICD-10-CM | POA: Diagnosis not present

## 2020-12-05 IMAGING — DX DG KNEE 1-2V PORT*R*
2 series · 2 of 2 positions shown · non-contrast
Comparison: None.

CLINICAL DATA: Right knee arthroplasty

EXAM:
PORTABLE RIGHT KNEE - 1-2 VIEW

[knee ap]
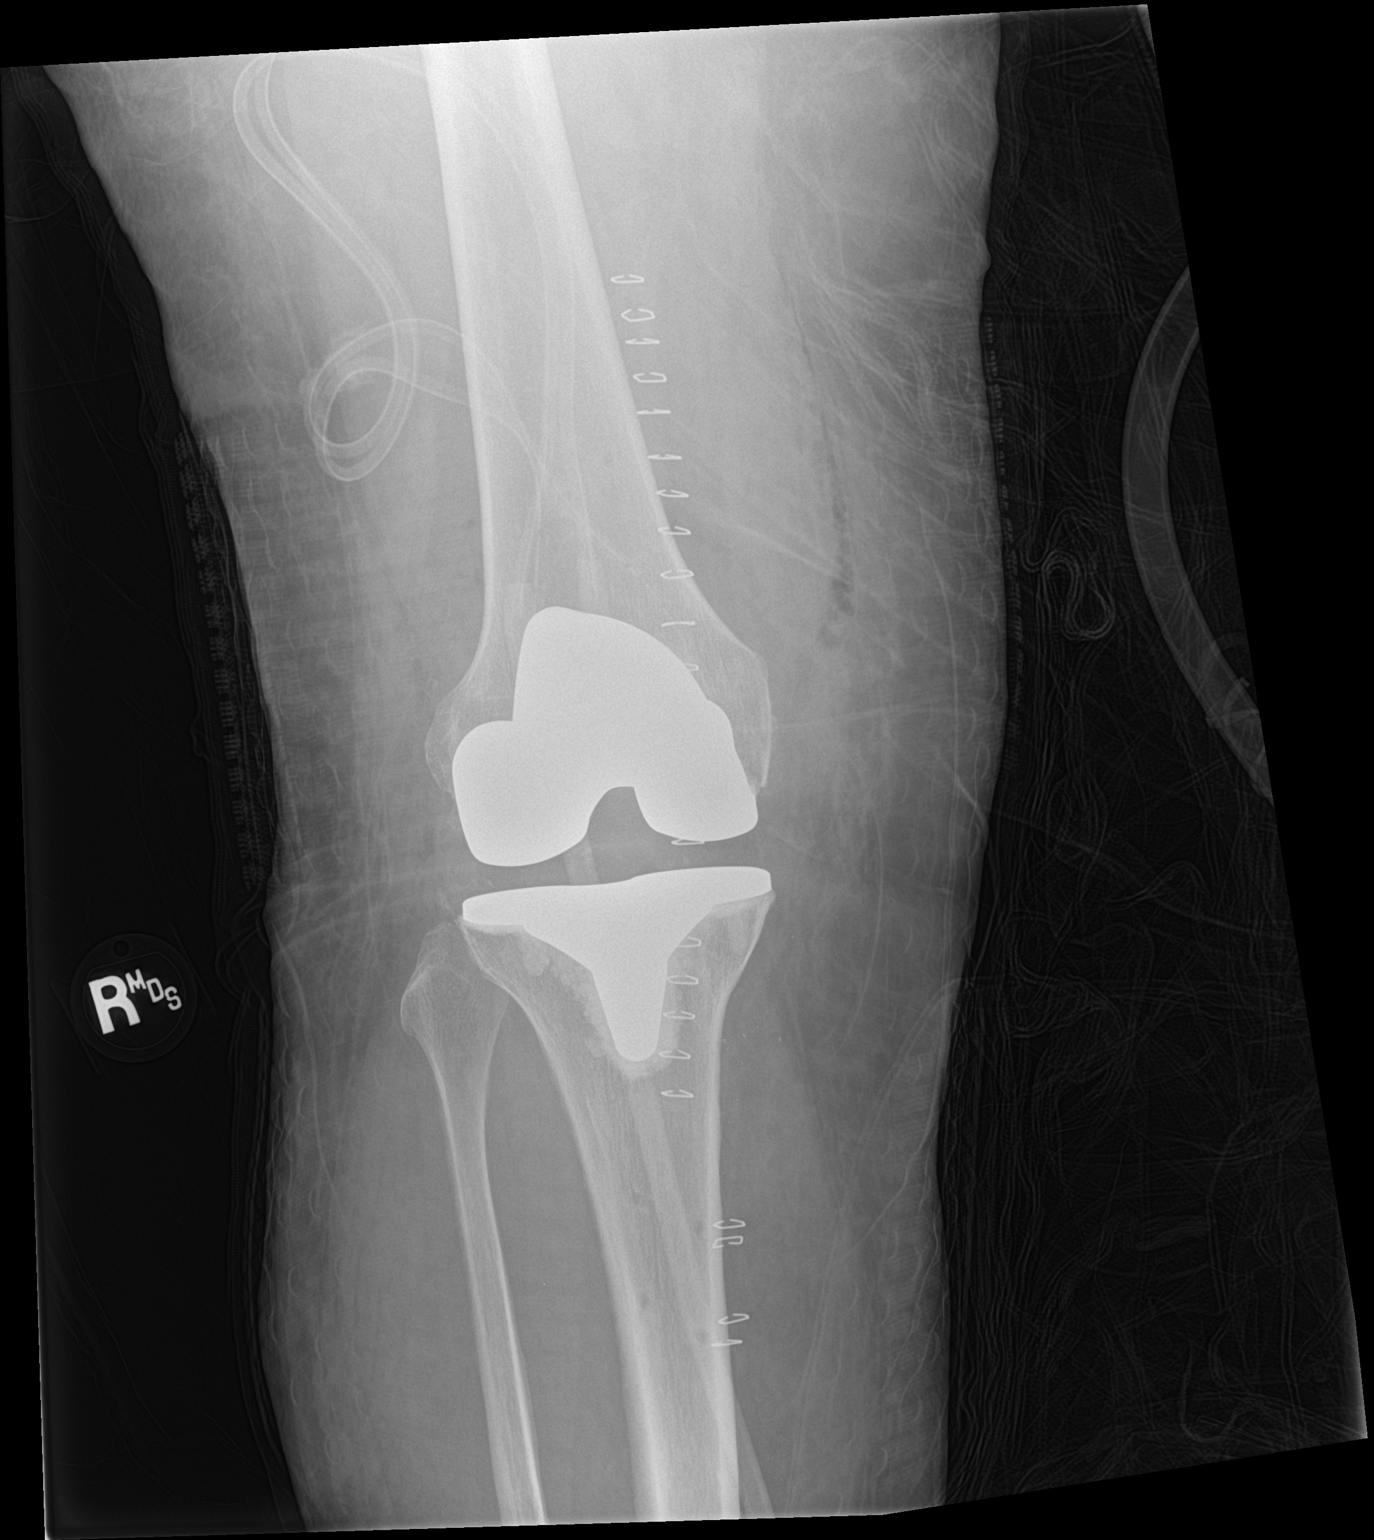

[knee lat]
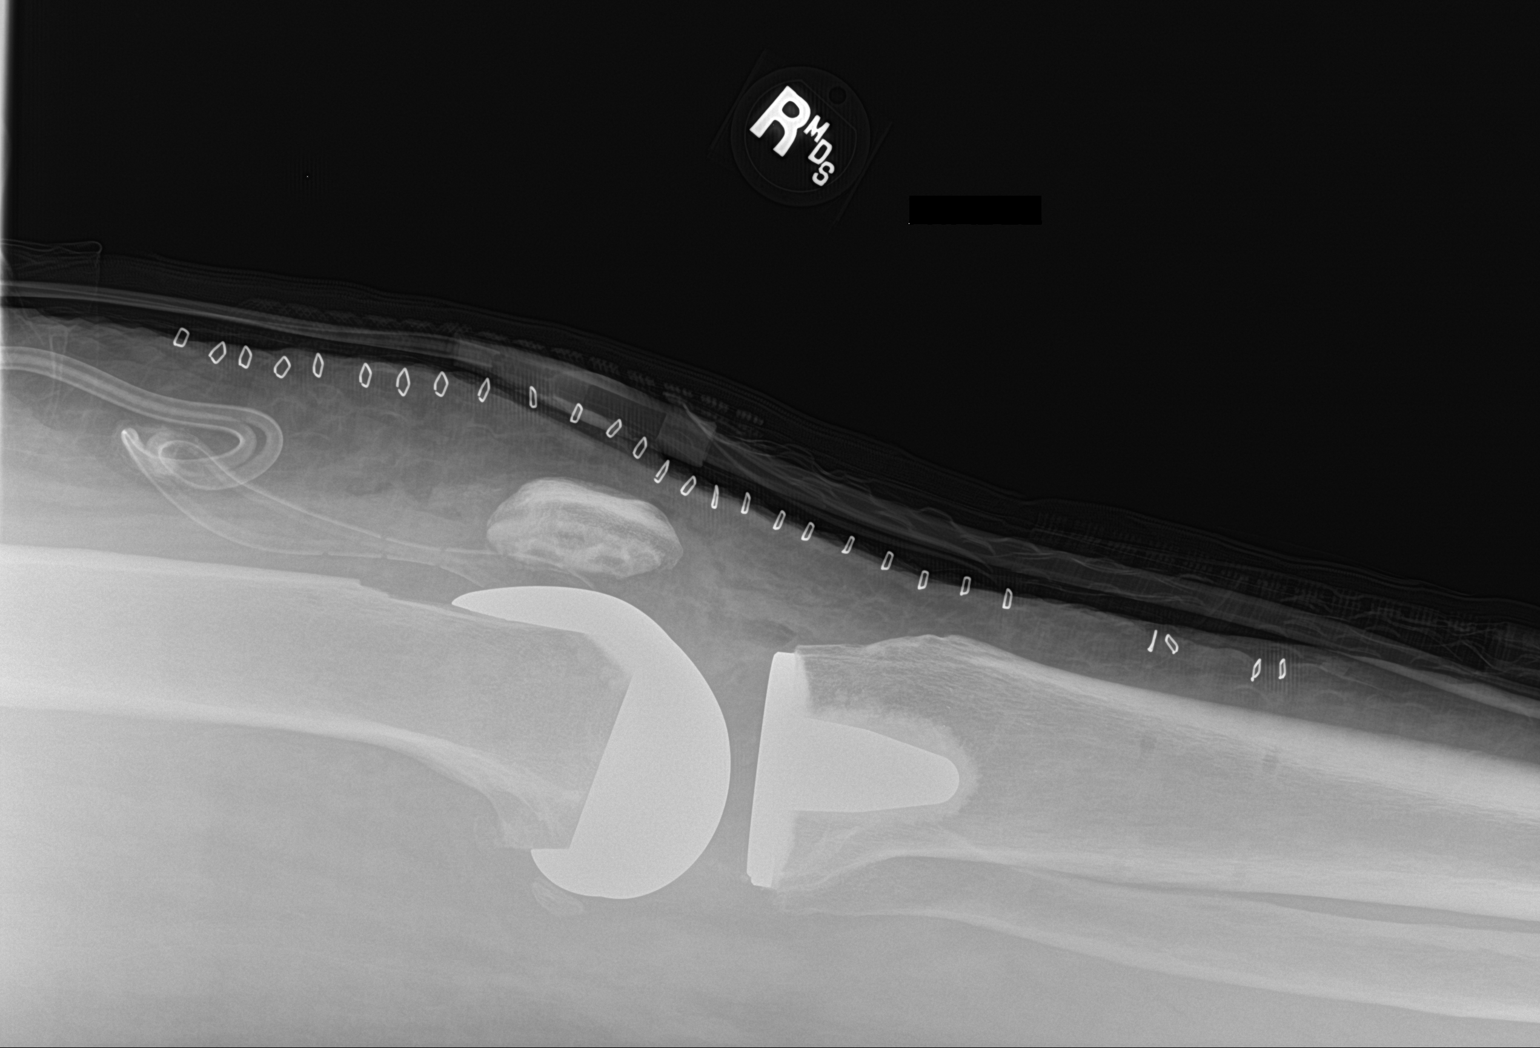

[2 of 2 positions shown; findings below may reference images not displayed]

FINDINGS: Postsurgical changes following right total knee arthroplasty.
Arthroplasty components are in their expected alignment without
periprosthetic fracture. Expected postoperative changes within the
anterior soft tissues including suprapatellar drains and overlying
skin staples.
IMPRESSION: Satisfactory postop appearance status post right total knee
arthroplasty.

## 2020-12-11 ENCOUNTER — Ambulatory Visit: Payer: Self-pay | Admitting: Family Medicine

## 2020-12-16 ENCOUNTER — Encounter: Payer: Self-pay | Admitting: Family Medicine

## 2020-12-16 ENCOUNTER — Ambulatory Visit (INDEPENDENT_AMBULATORY_CARE_PROVIDER_SITE_OTHER): Payer: BC Managed Care – PPO | Admitting: Family Medicine

## 2020-12-16 ENCOUNTER — Other Ambulatory Visit: Payer: Self-pay

## 2020-12-16 VITALS — BP 129/67 | HR 76 | Temp 98.8°F | Resp 16 | Ht 63.0 in | Wt 255.1 lb

## 2020-12-16 DIAGNOSIS — I10 Essential (primary) hypertension: Secondary | ICD-10-CM

## 2020-12-16 DIAGNOSIS — Z0000 Encounter for general adult medical examination without abnormal findings: Secondary | ICD-10-CM

## 2020-12-16 DIAGNOSIS — J45901 Unspecified asthma with (acute) exacerbation: Secondary | ICD-10-CM

## 2020-12-16 DIAGNOSIS — Z23 Encounter for immunization: Secondary | ICD-10-CM

## 2020-12-16 DIAGNOSIS — F339 Major depressive disorder, recurrent, unspecified: Secondary | ICD-10-CM

## 2020-12-16 NOTE — Progress Notes (Signed)
Complete physical exam   Patient: Rachael Brewer   DOB: [birth year]   60 y.o. Female  MRN: [id number] Visit Date: 12/16/2020  Today's healthcare provider: Lavon Paganini, MD   Chief Complaint  Patient presents with   Annual Exam   Subjective     HPI  Rachael Brewer is a 60 y.o. female who presents today for a complete physical exam. She reports consuming a general diet. The patient does not participate in regular exercise at present. She generally feels well. She reports sleeping fairly well. She does not have additional problems to discuss today.   Screenings  Last reported Mammogram 11/21/20 Colonoscopy 10/11/2011  Vaccines She had pneumonia and she is interested in receiving the vaccine. She has not received her second shingles vaccine. She has 3 COVID vaccines   Past Medical History:  Diagnosis Date   Anxiety    Arthritis    Complication of anesthesia    CAUSES MIGRAINE   Depression    Edema of both lower extremities    GERD (gastroesophageal reflux disease)    Migraines    Pre-diabetes    Sleep apnea    CPAP   Past Surgical History:  Procedure Laterality Date   ANTERIOR CRUCIATE LIGAMENT REPAIR Right 1996   DILATION AND CURETTAGE OF UTERUS  2004   FOOT SURGERY Bilateral    JOINT REPLACEMENT Right    KNEE ARTHROPLASTY Right 05/02/2019   Procedure: COMPUTER ASSISTED TOTAL KNEE ARTHROPLASTY;  Surgeon: Dereck Leep, MD;  Location: [facility];  Service: Orthopedics;  Laterality: Right;   KNEE ARTHROPLASTY Left 07/09/2019   Procedure: COMPUTER ASSISTED TOTAL KNEE ARTHROPLASTY;  Surgeon: Dereck Leep, MD;  Location: [facility];  Service: Orthopedics;  Laterality: Left;   KNEE SURGERY Right    MENISCUS REPAIR Bilateral    Social History   Socioeconomic History   Marital status: Married    Spouse name: Not on file   Number of children: Not on file   Years of education: Not on file   Highest education level: Not on file  Occupational History   Not  on file  Tobacco Use   Smoking status: Former    Packs/day: 1.00    Years: 15.00    Pack years: 15.00    Types: Cigarettes    Quit date: 06/21/1999    Years since quitting: 21.5   Smokeless tobacco: Never  Vaping Use   Vaping Use: Never used  Substance and Sexual Activity   Alcohol use: Yes    Alcohol/week: 0.0 standard drinks    Comment: occasional   Drug use: No   Sexual activity: Yes    Birth control/protection: None  Other Topics Concern   Not on file  Social History Narrative   Not on file   Social Determinants of Health   Financial Resource Strain: Not on file  Food Insecurity: Not on file  Transportation Needs: Not on file  Physical Activity: Not on file  Stress: Not on file  Social Connections: Not on file  Intimate Partner Violence: Not on file   Family Status  Relation Name Status   Mother  Alive   Father  Deceased   Brother  Alive   Other  (Not Specified)   Mat Aunt  (Not Specified)   Family History  Problem Relation Age of Onset   Hypertension Brother    Diabetes Brother    Diabetes Other    Hypertension Other    Glaucoma Other  Breast cancer Maternal Aunt        50's   Allergies  Allergen Reactions   Bactrim [Sulfamethoxazole-Trimethoprim] Shortness Of Breath and Swelling   Sulfa Antibiotics Anaphylaxis   Penicillins Hives    Did it involve swelling of the face/tongue/throat, SOB, or low BP? No Did it involve sudden or severe rash/hives, skin peeling, or any reaction on the inside of your mouth or nose? No Did you need to seek medical attention at a hospital or doctor's office? No When did it last happen?     10 years ago  If all above answers are "NO", may proceed with cephalosporin use.  Did it involve swelling of the face/tongue/throat, SOB, or low BP? No Did it involve sudden or severe rash/hives, skin peeling, or any reaction on the inside of your mouth or nose? No Did you need to seek medical attention at a hospital or doctor's  office? No When did it last happen?     10 years ago  If all above answers are "NO", may proceed with cephalosporin use.   Sumatriptan     Made migraine more intense and scalp felt like it was on fire    Patient Care Team: Virginia Crews, MD as PCP - General (Family Medicine)   Medications: Outpatient Medications Prior to Visit  Medication Sig   albuterol (VENTOLIN HFA) 108 (90 Base) MCG/ACT inhaler Inhale 1-2 puffs into the lungs every 6 (six) hours as needed for wheezing or shortness of breath.   aspirin-acetaminophen-caffeine (EXCEDRIN MIGRAINE) 250-250-65 MG tablet Take 2 tablets by mouth every 8 (eight) hours as needed for migraine.    Azelastine HCl 137 MCG/SPRAY SOLN Place 1 spray into both nostrils at bedtime.   baclofen (LIORESAL) 10 MG tablet Take 1 tablet (10 mg total) by mouth 2 (two) times daily as needed for muscle spasms.   buPROPion (WELLBUTRIN) 100 MG tablet TAKE 1 TABLET BY MOUTH TWICE A DAY   escitalopram (LEXAPRO) 20 MG tablet Take 1 tablet (20 mg total) by mouth daily.   fluticasone (FLONASE) 50 MCG/ACT nasal spray Place 2 sprays into both nostrils daily.   gabapentin (NEURONTIN) 600 MG tablet Take 1,200 mg by mouth at bedtime.    Multiple Vitamin (MULTIVITAMIN WITH MINERALS) TABS tablet Take 1 tablet by mouth daily.   omeprazole (PRILOSEC) 40 MG capsule Take 1 capsule (40 mg total) by mouth daily.   Potassium Chloride 40 MEQ/15ML (20%) SOLN Take 40 mEq by mouth daily.   rizatriptan (MAXALT) 10 MG tablet Take 10 mg by mouth every 2 (two) hours as needed for migraine (max 2 tablets/24 hrs.).    topiramate ER (QUDEXY XR) 150 MG CS24 sprinkle capsule Take 150 mg by mouth at bedtime.    torsemide (DEMADEX) 20 MG tablet Take 2 tablets (40 mg total) by mouth daily.   No facility-administered medications prior to visit.    Review of Systems  Constitutional:  Positive for appetite change. Negative for chills, fatigue and fever.  HENT:  Positive for hearing loss.  Negative for ear pain, sinus pressure, sinus pain and sore throat.   Eyes:  Negative for pain and visual disturbance.  Respiratory:  Positive for apnea. Negative for cough, chest tightness, shortness of breath and wheezing.   Cardiovascular:  Positive for leg swelling (mild). Negative for chest pain and palpitations.  Gastrointestinal:  Positive for constipation. Negative for abdominal pain, blood in stool, diarrhea, nausea and vomiting.  Endocrine: Positive for polyphagia.  Genitourinary:  Negative for dysuria, flank  pain, frequency, pelvic pain and urgency.  Musculoskeletal:  Positive for arthralgias, back pain, myalgias, neck pain and neck stiffness.  Neurological:  Positive for headaches. Negative for dizziness, seizures, syncope, weakness, light-headedness and numbness.  All other systems reviewed and are negative.    Objective    BP 129/67   Pulse 76   Temp 98.8 F (37.1 C) (Oral)   Resp 16   Ht 5\' 3"  (1.6 m)   Wt 255 lb 1.6 oz (115.7 kg)   LMP  (LMP Unknown)   SpO2 94%   BMI 45.19 kg/m    Physical Exam Vitals reviewed.  Constitutional:      General: She is not in acute distress.    Appearance: Normal appearance. She is well-developed. She is not diaphoretic.  HENT:     Head: Normocephalic and atraumatic.     Right Ear: Tympanic membrane, ear canal and external ear normal.     Left Ear: Tympanic membrane, ear canal and external ear normal.     Nose: Nose normal.     Mouth/Throat:     Mouth: Mucous membranes are moist.     Pharynx: Oropharynx is clear. No oropharyngeal exudate.  Eyes:     General: No scleral icterus.    Conjunctiva/sclera: Conjunctivae normal.     Pupils: Pupils are equal, round, and reactive to light.  Neck:     Thyroid: No thyromegaly.  Cardiovascular:     Rate and Rhythm: Normal rate and regular rhythm.     Pulses: Normal pulses.     Heart sounds: Normal heart sounds. No murmur heard. Pulmonary:     Effort: Pulmonary effort is normal. No  respiratory distress.     Breath sounds: Normal breath sounds. No wheezing or rales.  Abdominal:     General: There is no distension.     Palpations: Abdomen is soft.     Tenderness: There is no abdominal tenderness.  Musculoskeletal:        General: No deformity.     Cervical back: Neck supple.     Right lower leg: No edema.     Left lower leg: No edema.  Lymphadenopathy:     Cervical: No cervical adenopathy.  Skin:    General: Skin is warm and dry.     Findings: No rash.  Neurological:     Mental Status: She is alert and oriented to person, place, and time. Mental status is at baseline.     Sensory: No sensory deficit.     Motor: No weakness.     Gait: Gait normal.  Psychiatric:        Mood and Affect: Mood normal.        Behavior: Behavior normal.        Thought Content: Thought content normal.    Last depression screening scores PHQ 2/9 Scores 12/16/2020 07/03/2020 12/12/2019  PHQ - 2 Score 2 4 4   PHQ- 9 Score 11 15 14    Last fall risk screening Fall Risk  12/12/2019  Falls in the past year? 0  Number falls in past yr: 0  Injury with Fall? 0   Last Audit-C alcohol use screening Alcohol Use Disorder Test (AUDIT) 12/16/2020  1. How often do you have a drink containing alcohol? 1  2. How many drinks containing alcohol do you have on a typical day when you are drinking? 0  3. How often do you have six or more drinks on one occasion? 0  AUDIT-C Score 1  Alcohol Brief  Interventions/Follow-up -   A score of 3 or more in women, and 4 or more in men indicates increased risk for alcohol abuse, EXCEPT if all of the points are from question 1   No results found for any visits on 12/16/20.  Assessment & Plan    Routine Health Maintenance and Physical Exam  Exercise Activities and Dietary recommendations  Goals      Exercise 150 minutes per week (moderate activity)        Immunization History  Administered Date(s) Administered   Influenza Inj Mdck Quad Pf 03/13/2018    Influenza,inj,Quad PF,6+ Mos 04/02/2017, 02/09/2018, 05/03/2019   Influenza,inj,quad, With Preservative 04/02/2017   Influenza-Unspecified 05/17/2016, 04/21/2020   PFIZER(Purple Top)SARS-COV-2 Vaccination 12/15/2019, 01/05/2020, 07/03/2020   PNEUMOCOCCAL CONJUGATE-20 12/16/2020   Td 07/22/2000   Tdap 05/11/2017   Zoster Recombinat (Shingrix) 07/17/2019, 12/16/2020    Health Maintenance  Topic Date Due   COVID-19 Vaccine (4 - Booster for Pfizer series) 10/31/2020   INFLUENZA VACCINE  01/19/2021   COLONOSCOPY (Pts 45-18yrs Insurance coverage will need to be confirmed)  10/10/2021   MAMMOGRAM  11/22/2022   PAP SMEAR-Modifier  12/11/2024   TETANUS/TDAP  05/12/2027   Hepatitis C Screening  Completed   HIV Screening  Completed   Zoster Vaccines- Shingrix  Completed   Pneumococcal Vaccine 4-55 Years old  Aged Out   HPV VACCINES  Aged Out    Discussed health benefits of physical activity, and encouraged her to engage in regular exercise appropriate for her age and condition.  Problem List Items Addressed This Visit       Cardiovascular and Mediastinum   BP (high blood pressure)    Well controlled Continue current medications Recheck metabolic panel F/u in 6 months          Respiratory   AB (asthmatic bronchitis)    No active bronchitis currently PCV20 today Recent pneumonia episode       Relevant Orders   Pneumococcal conjugate vaccine 20-valent (Completed)     Other   Depression, recurrent (HCC)    Chronic and stable Continue current meds Encourage therapy       Morbid obesity ([place])    Discussed importance of healthy weight management Discussed diet and exercise        Other Visit Diagnoses     Encounter for annual physical exam    -  Primary   Need for zoster vaccination       Relevant Orders   Varicella-zoster vaccine IM (Completed)        Return in about 6 months (around 06/17/2021).     I,Essence Turner,acting as a Education administrator for Lavon Paganini, MD.,have documented all relevant documentation on the behalf of Lavon Paganini, MD,as directed by  Lavon Paganini, MD while in the presence of Lavon Paganini, MD.  I, Lavon Paganini, MD, have reviewed all documentation for this visit. The documentation on 12/17/20 for the exam, diagnosis, procedures, and orders are all accurate and complete.   Paddy Neis, Dionne Bucy, MD, MPH [facility]

## 2020-12-16 NOTE — Patient Instructions (Signed)
Health Maintenance, Female Adopting a healthy lifestyle and getting preventive care are important in promoting health and wellness. Ask your health care provider about: The right schedule for you to have regular tests and exams. Things you can do on your own to prevent diseases and keep yourself healthy. What should I know about diet, weight, and exercise? Eat a healthy diet  Eat a diet that includes plenty of vegetables, fruits, low-fat dairy products, and lean protein. Do not eat a lot of foods that are high in solid fats, added sugars, or sodium.  Maintain a healthy weight Body mass index (BMI) is used to identify weight problems. It estimates body fat based on height and weight. Your health care provider can help determineyour BMI and help you achieve or maintain a healthy weight. Get regular exercise Get regular exercise. This is one of the most important things you can do for your health. Most adults should: Exercise for at least 150 minutes each week. The exercise should increase your heart rate and make you sweat (moderate-intensity exercise). Do strengthening exercises at least twice a week. This is in addition to the moderate-intensity exercise. Spend less time sitting. Even light physical activity can be beneficial. Watch cholesterol and blood lipids Have your blood tested for lipids and cholesterol at 61 years of age, then havethis test every 5 years. Have your cholesterol levels checked more often if: Your lipid or cholesterol levels are high. You are older than 61 years of age. You are at high risk for heart disease. What should I know about cancer screening? Depending on your health history and family history, you may need to have cancer screening at various ages. This may include screening for: Breast cancer. Cervical cancer. Colorectal cancer. Skin cancer. Lung cancer. What should I know about heart disease, diabetes, and high blood pressure? Blood pressure and heart  disease High blood pressure causes heart disease and increases the risk of stroke. This is more likely to develop in people who have high blood pressure readings, are of African descent, or are overweight. Have your blood pressure checked: Every 3-5 years if you are 18-39 years of age. Every year if you are 40 years old or older. Diabetes Have regular diabetes screenings. This checks your fasting blood sugar level. Have the screening done: Once every three years after age 40 if you are at a normal weight and have a low risk for diabetes. More often and at a younger age if you are overweight or have a high risk for diabetes. What should I know about preventing infection? Hepatitis B If you have a higher risk for hepatitis B, you should be screened for this virus. Talk with your health care provider to find out if you are at risk forhepatitis B infection. Hepatitis C Testing is recommended for: Everyone born from 1945 through 1965. Anyone with known risk factors for hepatitis C. Sexually transmitted infections (STIs) Get screened for STIs, including gonorrhea and chlamydia, if: You are sexually active and are younger than 61 years of age. You are older than 61 years of age and your health care provider tells you that you are at risk for this type of infection. Your sexual activity has changed since you were last screened, and you are at increased risk for chlamydia or gonorrhea. Ask your health care provider if you are at risk. Ask your health care provider about whether you are at high risk for HIV. Your health care provider may recommend a prescription medicine to help   prevent HIV infection. If you choose to take medicine to prevent HIV, you should first get tested for HIV. You should then be tested every 3 months for as long as you are taking the medicine. Pregnancy If you are about to stop having your period (premenopausal) and you may become pregnant, seek counseling before you get  pregnant. Take 400 to 800 micrograms (mcg) of folic acid every day if you become pregnant. Ask for birth control (contraception) if you want to prevent pregnancy. Osteoporosis and menopause Osteoporosis is a disease in which the bones lose minerals and strength with aging. This can result in bone fractures. If you are 33 years old or older, or if you are at risk for osteoporosis and fractures, ask your health care provider if you should: Be screened for bone loss. Take a calcium or vitamin D supplement to lower your risk of fractures. Be given hormone replacement therapy (HRT) to treat symptoms of menopause. Follow these instructions at home: Lifestyle Do not use any products that contain nicotine or tobacco, such as cigarettes, e-cigarettes, and chewing tobacco. If you need help quitting, ask your health care provider. Do not use street drugs. Do not share needles. Ask your health care provider for help if you need support or information about quitting drugs. Alcohol use Do not drink alcohol if: Your health care provider tells you not to drink. You are pregnant, may be pregnant, or are planning to become pregnant. If you drink alcohol: Limit how much you use to 0-1 drink a day. Limit intake if you are breastfeeding. Be aware of how much alcohol is in your drink. In the [place] one drink equals one 12 oz bottle of beer (355 mL), one 5 oz glass of wine (148 mL), or one 1 oz glass of hard liquor (44 mL). General instructions Schedule regular health, dental, and eye exams. Stay current with your vaccines. Tell your health care provider if: You often feel depressed. You have ever been abused or do not feel safe at home. Summary Adopting a healthy lifestyle and getting preventive care are important in promoting health and wellness. Follow your health care provider's instructions about healthy diet, exercising, and getting tested or screened for diseases. Follow your health care provider's  instructions on monitoring your cholesterol and blood pressure. This information is not intended to replace advice given to you by your health care provider. Make sure you discuss any questions you have with your healthcare provider. Document Revised: 05/31/2018 Document Reviewed: 05/31/2018 Elsevier Patient Education  2022 [place] Breast self-awareness is knowing how your breasts look and feel. Doing breast self-awareness is important. It allows you to catch a breast problem early while it is still small and can be treated. All women should do breast self-awareness, including women who have had breast implants. Tell your doctorif you notice a change in your breasts. What you need: A mirror. A well-lit room. How to do a breast self-exam A breast self-exam is one way to learn what is normal for your breasts and tocheck for changes. To do a breast self-exam: Look for changes  Take off all the clothes above your waist. Stand in front of a mirror in a room with good lighting. Put your hands on your hips. Push your hands down. Look at your breasts and nipples in the mirror to see if one breast or nipple looks different from the other. Check to see if: The shape of one breast is different. The size of  one breast is different. There are wrinkles, dips, and bumps in one breast and not the other. Look at each breast for changes in the skin, such as: Redness. Scaly areas. Look for changes in your nipples, such as: Liquid around the nipples. Bleeding. Dimpling. Redness. A change in where the nipples are.  Feel for changes  Lie on your back on the floor. Feel each breast. To do this, follow these steps: Pick a breast to feel. Put the arm closest to that breast above your head. Use your other arm to feel the nipple area of your breast. Feel the area with the pads of your three middle fingers by making small circles with your fingers. For the first circle, press  lightly. For the second circle, press harder. For the third circle, press even harder. Keep making circles with your fingers at the different pressures as you move down your breast. Stop when you feel your ribs. Move your fingers a little toward the center of your body. Start making circles with your fingers again, this time going up until you reach your collarbone. Keep making up-and-down circles until you reach your armpit. Remember to keep using the three pressures. Feel the other breast in the same way. Sit or stand in the tub or shower. With soapy water on your skin, feel each breast the same way you did in step 2 when you were lying on the floor.  Write down what you find Writing down what you find can help you remember what to tell your doctor. Write down: What is normal for each breast. Any changes you find in each breast, including: The kind of changes you find. Whether you have pain. Size and location of any lumps. When you last had your menstrual period. General tips Check your breasts every month. If you are breastfeeding, the best time to check your breasts is after you feed your baby or after you use a breast pump. If you get menstrual periods, the best time to check your breasts is 5-7 days after your menstrual period is over. With time, you will become comfortable with the self-exam, and you will begin to know if there are changes in your breasts. Contact a doctor if you: See a change in the shape or size of your breasts or nipples. See a change in the skin of your breast or nipples, such as red or scaly skin. Have fluid coming from your nipples that is not normal. Find a lump or thick area that was not there before. Have pain in your breasts. Have any concerns about your breast health. Summary Breast self-awareness includes looking for changes in your breasts, as well as feeling for changes within your breasts. Breast self-awareness should be done in front of a mirror  in a well-lit room. You should check your breasts every month. If you get menstrual periods, the best time to check your breasts is 5-7 days after your menstrual period is over. Let your doctor know of any changes you see in your breasts, including changes in size, changes on the skin, pain or tenderness, or fluid from your nipples that is not normal. This information is not intended to replace advice given to you by your health care provider. Make sure you discuss any questions you have with your healthcare provider. Document Revised: 01/24/2018 Document Reviewed: 01/24/2018 Elsevier Patient Education  [place].  Pneumococcal Conjugate Vaccine (Prevnar 20) Suspension for Injection What is this medication? PNEUMOCOCCAL VACCINE (NEU mo KOK al vak  SEEN) is a vaccine. It prevents pneumococcus bacterial infections. These bacteria can cause serious infections like pneumonia, meningitis, and blood infections. This vaccine will not treat an infection and will not cause infection. This vaccine is recommended foradults 18 years and older. This medicine may be used for other purposes; ask your health care provider orpharmacist if you have questions. COMMON BRAND NAME(S): Prevnar 20 What should I tell my care team before I take this medication? They need to know if you have any of these conditions: bleeding disorder fever immune system problems an unusual or allergic reaction to pneumococcal vaccine, diphtheria toxoid, other vaccines, other medicines, foods, dyes, or preservatives pregnant or trying to get pregnant breast-feeding How should I use this medication? This vaccine is injected into a muscle. It is given by a health care provider. A copy of Vaccine Information Statements will be given before each vaccination. Be sure to read this information carefully each time. This sheet may changeoften. Talk to your health care provider about the use of this medicine in children.Special care may be  needed. Overdosage: If you think you have taken too much of this medicine contact apoison control center or emergency room at once. NOTE: This medicine is only for you. Do not share this medicine with others. What if I miss a dose? This does not apply. This medicine is not for regular use. What may interact with this medication? medicines for cancer chemotherapy medicines that suppress your immune function steroid medicines like prednisone or cortisone This list may not describe all possible interactions. Give your health care provider a list of all the medicines, herbs, non-prescription drugs, or dietary supplements you use. Also tell them if you smoke, drink alcohol, or use illegaldrugs. Some items may interact with your medicine. What should I watch for while using this medication? Mild fever and pain should go away in 3 days or less. Report any unusualsymptoms to your health care provider. What side effects may I notice from receiving this medication? Side effects that you should report to your doctor or health care professionalas soon as possible: allergic reactions (skin rash, itching or hives; swelling of the face, lips, or tongue) confusion fast, irregular heartbeat fever over 102 degrees F muscle weakness seizures trouble breathing unusual bruising or bleeding Side effects that usually do not require medical attention (report to yourdoctor or health care professional if they continue or are bothersome): fever of 102 degrees F or less headache joint pain muscle cramps, pain pain, tender at site where injected This list may not describe all possible side effects. Call your doctor for medical advice about side effects. You may report side effects to FDA at1-800-FDA-1088. Where should I keep my medication? This vaccine is only given by a health care provider. It will not be stored athome. NOTE: This sheet is a summary. It may not cover all possible information. If you have  questions about this medicine, talk to your doctor, pharmacist, orhealth care provider.  2022 Elsevier/[employer] Standard (2020-02-14 16:14:35)  Zoster Vaccine, Recombinant injection What is this medication? ZOSTER VACCINE (ZOS ter vak SEEN) is a vaccine used to reduce the risk of getting shingles. This vaccine is not used to treat shingles or nerve pain fromshingles. This medicine may be used for other purposes; ask your health care provider orpharmacist if you have questions. COMMON BRAND NAME(S): [facility] What should I tell my care team before I take this medication? They need to know if you have any of these conditions: cancer immune system  problems an unusual or allergic reaction to Zoster vaccine, other medications, foods, dyes, or preservatives pregnant or trying to get pregnant breast-feeding How should I use this medication? This vaccine is injected into a muscle. It is given by a health care provider. A copy of Vaccine Information Statements will be given before each vaccination. Be sure to read this information carefully each time. This sheet may changeoften. Talk to your health care provider about the use of this vaccine in children.This vaccine is not approved for use in children. Overdosage: If you think you have taken too much of this medicine contact apoison control center or emergency room at once. NOTE: This medicine is only for you. Do not share this medicine with others. What if I miss a dose? Keep appointments for follow-up (booster) doses. It is important not to miss your dose. Call your health care provider if you are unable to keep anappointment. What may interact with this medication? medicines that suppress your immune system medicines to treat cancer steroid medicines like prednisone or cortisone This list may not describe all possible interactions. Give your health care provider a list of all the medicines, herbs, non-prescription drugs, or dietary supplements you  use. Also tell them if you smoke, drink alcohol, or use illegaldrugs. Some items may interact with your medicine. What should I watch for while using this medication? Visit your health care provider regularly. This vaccine, like all vaccines, may not fully protect everyone. What side effects may I notice from receiving this medication? Side effects that you should report to your doctor or health care professionalas soon as possible: allergic reactions (skin rash, itching or hives; swelling of the face, lips, or tongue) trouble breathing Side effects that usually do not require medical attention (report these toyour doctor or health care professional if they continue or are bothersome): chills headache fever nausea pain, redness, or irritation at site where injected tiredness vomiting This list may not describe all possible side effects. Call your doctor for medical advice about side effects. You may report side effects to FDA at1-800-FDA-1088. Where should I keep my medication? This vaccine is only given by a health care provider. It will not be stored athome. NOTE: This sheet is a summary. It may not cover all possible information. If you have questions about this medicine, talk to your doctor, pharmacist, orhealth care provider.  2022 Elsevier/Gold Standard (2019-07-13 16:23:07)

## 2020-12-17 NOTE — Assessment & Plan Note (Signed)
No active bronchitis currently PCV20 today Recent pneumonia episode

## 2020-12-17 NOTE — Assessment & Plan Note (Signed)
Well controlled Continue current medications Recheck metabolic panel F/u in 6 months  

## 2020-12-17 NOTE — Assessment & Plan Note (Signed)
Discussed importance of healthy weight management Discussed diet and exercise  

## 2020-12-17 NOTE — Assessment & Plan Note (Signed)
Chronic and stable Continue current meds Encourage therapy

## 2020-12-18 ENCOUNTER — Ambulatory Visit: Payer: BC Managed Care – PPO | Admitting: Family Medicine

## 2020-12-18 ENCOUNTER — Ambulatory Visit: Payer: Self-pay | Admitting: *Deleted

## 2020-12-18 ENCOUNTER — Encounter: Payer: Self-pay | Admitting: Family Medicine

## 2020-12-18 ENCOUNTER — Other Ambulatory Visit: Payer: Self-pay

## 2020-12-18 VITALS — BP 122/58 | HR 65 | Temp 98.7°F | Resp 15 | Wt 255.0 lb

## 2020-12-18 DIAGNOSIS — M62838 Other muscle spasm: Secondary | ICD-10-CM

## 2020-12-18 DIAGNOSIS — T50Z95A Adverse effect of other vaccines and biological substances, initial encounter: Secondary | ICD-10-CM | POA: Diagnosis not present

## 2020-12-18 MED ORDER — CYCLOBENZAPRINE HCL 5 MG PO TABS: 5.0000 mg | ORAL_TABLET | Freq: Three times a day (TID) | ORAL | 0 refills | Status: DC | PRN

## 2020-12-18 NOTE — Telephone Encounter (Signed)
Appt scheduled per Dr B.

## 2020-12-18 NOTE — Patient Instructions (Signed)
Neck Exercises Ask your health care provider which exercises are safe for you. Do exercises exactly as told by your health care provider and adjust them as directed. It is normal to feel mild stretching, pulling, tightness, or discomfort as you do these exercises. Stop right away if you feel sudden pain or your pain gets worse. Do not begin these exercises until told by your health care provider. Neck exercises can be important for many reasons. They can improve strength and maintain flexibility in your neck, which will help your upper back and preventneck pain. Stretching exercises Rotation neck stretching  Sit in a chair or stand up. Place your feet flat on the floor, shoulder width apart. Slowly turn your head (rotate) to the right until a slight stretch is felt. Turn it all the way to the right so you can look over your right shoulder. Do not tilt or tip your head. Hold this position for 10-30 seconds. Slowly turn your head (rotate) to the left until a slight stretch is felt. Turn it all the way to the left so you can look over your left shoulder. Do not tilt or tip your head. Hold this position for 10-30 seconds. Repeat __________ times. Complete this exercise __________ times a day. Neck retraction Sit in a sturdy chair or stand up. Look straight ahead. Do not bend your neck. Use your fingers to push your chin backward (retraction). Do not bend your neck for this movement. Continue to face straight ahead. If you are doing the exercise properly, you will feel a slight sensation in your throat and a stretch at the back of your neck. Hold the stretch for 1-2 seconds. Repeat __________ times. Complete this exercise __________ times a day. Strengthening exercises Neck press Lie on your back on a firm bed or on the floor with a pillow under your head. Use your neck muscles to push your head down on the pillow and straighten your spine. Hold the position as well as you can. Keep your head  facing up (in a neutral position) and your chin tucked. Slowly count to 5 while holding this position. Repeat __________ times. Complete this exercise __________ times a day. Isometrics These are exercises in which you strengthen the muscles in your neck while keeping your neck still (isometrics). Sit in a supportive chair and place your hand on your forehead. Keep your head and face facing straight ahead. Do not flex or extend your neck while doing isometrics. Push forward with your head and neck while pushing back with your hand. Hold for 10 seconds. Do the sequence again, this time putting your hand against the back of your head. Use your head and neck to push backward against the hand pressure. Finally, do the same exercise on either side of your head, pushing sideways against the pressure of your hand. Repeat __________ times. Complete this exercise __________ times a day. Prone head lifts Lie face-down (prone position), resting on your elbows so that your chest and upper back are raised. Start with your head facing downward, near your chest. Position your chin either on or near your chest. Slowly lift your head upward. Lift until you are looking straight ahead. Then continue lifting your head as far back as you can comfortably stretch. Hold your head up for 5 seconds. Then slowly lower it to your starting position. Repeat __________ times. Complete this exercise __________ times a day. Supine head lifts Lie on your back (supine position), bending your knees to point to the   ceiling and keeping your feet flat on the floor. Lift your head slowly off the floor, raising your chin toward your chest. Hold for 5 seconds. Repeat __________ times. Complete this exercise __________ times a day. Scapular retraction Stand with your arms at your sides. Look straight ahead. Slowly pull both shoulders (scapulae) backward and downward (retraction) until you feel a stretch between your shoulder blades in  your upper back. Hold for 10-30 seconds. Relax and repeat. Repeat __________ times. Complete this exercise __________ times a day. Contact a health care provider if: Your neck pain or discomfort gets much worse when you do an exercise. Your neck pain or discomfort does not improve within 2 hours after you exercise. If you have any of these problems, stop exercising right away. Do not do the exercises again unless your health care provider says that you can. Get help right away if: You develop sudden, severe neck pain. If this happens, stop exercising right away. Do not do the exercises again unless your health care provider says that you can. This information is not intended to replace advice given to you by your health care provider. Make sure you discuss any questions you have with your healthcare provider. Document Revised: 04/05/2018 Document Reviewed: 04/05/2018 Elsevier Patient Education  2022 [employer]  

## 2020-12-18 NOTE — Progress Notes (Signed)
Established patient visit   Patient: Rachael Brewer   DOB: 05-Jul-1960   60 y.o. Female  MRN: [id number] Visit Date: 12/18/2020  Today's healthcare provider: Lavon Paganini, MD   Chief Complaint  Patient presents with   Immunization Reaction    Patient presents in office today with concerns of warmth and soreness at the site of injection in her right arm. Patient states that when she turns her neck to the right side she describes pain as a soreness in the muscle and reports redness on the right side spreading to the base of neck. Yesterday patient reports pain was more severe at base of neck and radiated to left side of her neck she states this morning she could not turn her head on the left side and states pain when bending head forward   Subjective    HPI  She denies tingling or numbness that radiates down to her fingers. Her headaches are associated with her immune reaction to the shingles vaccine.   HPI     Immunization Reaction           Comments: Patient presents in office today with concerns of warmth and soreness at the site of injection in her right arm. Patient states that when she turns her neck to the right side she describes pain as a soreness in the muscle and reports redness on the right side spreading to the base of neck. Yesterday patient reports pain was more severe at base of neck and radiated to left side of her neck she states this morning she could not turn her head on the left side and states pain when bending head forward       Last edited by Minette Headland, CMA on 12/18/2020 10:46 AM.      Immunization History  Administered Date(s) Administered   Influenza Inj Mdck Quad Pf 03/13/2018   Influenza,inj,Quad PF,6+ Mos 04/02/2017, 02/09/2018, 05/03/2019   Influenza,inj,quad, With Preservative 04/02/2017   Influenza-Unspecified 05/17/2016, 04/21/2020   PFIZER(Purple Top)SARS-COV-2 Vaccination 12/15/2019, 01/05/2020, 07/03/2020   PNEUMOCOCCAL  CONJUGATE-20 12/16/2020   Td 07/22/2000   Tdap 05/11/2017   Zoster Recombinat (Shingrix) 07/17/2019, 12/16/2020    Patient Active Problem List   Diagnosis Date Noted   Atypical squamous cell changes of undetermined significance (ASCUS) on cervical cytology with negative high risk human papilloma virus (HPV) test result 02/18/2020   Pre-diabetes 12/12/2019   Total knee replacement status 05/02/2019   Lymphedema 05/07/2016   Morbid obesity ([place]) 01/01/2016   Venous insufficiency 01/01/2016   Pes planus of both feet 01/01/2016   Apnea 01/01/2016   AB (asthmatic bronchitis) 06/10/2015   Allergic rhinitis 06/10/2015   Benign paroxysmal positional nystagmus 06/10/2015   Acid reflux 06/10/2015   Lesion of nose 06/10/2015   Low back strain 06/10/2015   Plantar fasciitis 12/02/2014   Posterior tibial tendonitis 12/02/2014   Arthropathia 12/18/2008   Depression, recurrent ([place]) 07/21/2006   Hypercholesteremia 07/21/2006   BP (high blood pressure) 07/21/2006   Headache, migraine 07/21/2006   Past Medical History:  Diagnosis Date   Anxiety    Arthritis    Complication of anesthesia    CAUSES MIGRAINE   Depression    Edema of both lower extremities    GERD (gastroesophageal reflux disease)    Migraines    Pre-diabetes    Sleep apnea    CPAP   Allergies  Allergen Reactions   Bactrim [Sulfamethoxazole-Trimethoprim] Shortness Of Breath and Swelling   Sulfa Antibiotics Anaphylaxis  Penicillins Hives    Did it involve swelling of the face/tongue/throat, SOB, or low BP? No Did it involve sudden or severe rash/hives, skin peeling, or any reaction on the inside of your mouth or nose? No Did you need to seek medical attention at a hospital or doctor's office? No When did it last happen?     10 years ago  If all above answers are "NO", may proceed with cephalosporin use.  Did it involve swelling of the face/tongue/throat, SOB, or low BP? No Did it involve sudden or severe rash/hives,  skin peeling, or any reaction on the inside of your mouth or nose? No Did you need to seek medical attention at a hospital or doctor's office? No When did it last happen?     10 years ago  If all above answers are "NO", may proceed with cephalosporin use.   Sumatriptan     Made migraine more intense and scalp felt like it was on fire    Medications: Outpatient Medications Prior to Visit  Medication Sig   albuterol (VENTOLIN HFA) 108 (90 Base) MCG/ACT inhaler Inhale 1-2 puffs into the lungs every 6 (six) hours as needed for wheezing or shortness of breath.   aspirin-acetaminophen-caffeine (EXCEDRIN MIGRAINE) 250-250-65 MG tablet Take 2 tablets by mouth every 8 (eight) hours as needed for migraine.    Azelastine HCl 137 MCG/SPRAY SOLN Place 1 spray into both nostrils at bedtime.   baclofen (LIORESAL) 10 MG tablet Take 1 tablet (10 mg total) by mouth 2 (two) times daily as needed for muscle spasms.   buPROPion (WELLBUTRIN) 100 MG tablet TAKE 1 TABLET BY MOUTH TWICE A DAY   escitalopram (LEXAPRO) 20 MG tablet Take 1 tablet (20 mg total) by mouth daily.   fluticasone (FLONASE) 50 MCG/ACT nasal spray Place 2 sprays into both nostrils daily.   gabapentin (NEURONTIN) 600 MG tablet Take 1,200 mg by mouth at bedtime.    Multiple Vitamin (MULTIVITAMIN WITH MINERALS) TABS tablet Take 1 tablet by mouth daily.   omeprazole (PRILOSEC) 40 MG capsule Take 1 capsule (40 mg total) by mouth daily.   Potassium Chloride 40 MEQ/15ML (20%) SOLN Take 40 mEq by mouth daily.   rizatriptan (MAXALT) 10 MG tablet Take 10 mg by mouth every 2 (two) hours as needed for migraine (max 2 tablets/24 hrs.).    topiramate ER (QUDEXY XR) 150 MG CS24 sprinkle capsule Take 150 mg by mouth at bedtime.    torsemide (DEMADEX) 20 MG tablet Take 2 tablets (40 mg total) by mouth daily.   No facility-administered medications prior to visit.   Review of Systems  Constitutional: Negative.  Negative for chills, fatigue and fever.  HENT:   Negative for sinus pressure, sinus pain and sore throat.   Eyes: Negative.  Negative for pain and visual disturbance.  Respiratory: Negative.  Negative for cough, chest tightness, shortness of breath and wheezing.   Cardiovascular: Negative.  Negative for chest pain, palpitations and leg swelling.  Gastrointestinal: Negative.  Negative for abdominal pain, diarrhea, nausea and vomiting.  Genitourinary:  Negative for flank pain, frequency, pelvic pain and urgency.  Musculoskeletal:  Positive for arthralgias, neck pain and neck stiffness. Negative for back pain.  Skin:  Positive for rash.  Neurological:  Positive for headaches. Negative for dizziness, weakness, light-headedness and numbness.      Objective    LMP  (LMP Unknown)     Physical Exam Vitals reviewed.  Constitutional:      General: She is not in  acute distress.    Appearance: Normal appearance. She is well-developed. She is not diaphoretic.  HENT:     Head: Normocephalic and atraumatic.  Eyes:     General: No scleral icterus.    Conjunctiva/sclera: Conjunctivae normal.  Neck:     Thyroid: No thyromegaly.     Comments: Negative for swollen lymph nodes  Decreased range of motion, no redness, or tenderness  Tightness of the trapezius muscles Cardiovascular:     Rate and Rhythm: Normal rate and regular rhythm.     Pulses: Normal pulses.     Heart sounds: Normal heart sounds. No murmur heard. Pulmonary:     Effort: Pulmonary effort is normal. No respiratory distress.     Breath sounds: Normal breath sounds. No wheezing, rhonchi or rales.  Musculoskeletal:     Cervical back: Neck supple. No muscular tenderness.     Right lower leg: No edema.     Left lower leg: No edema.  Lymphadenopathy:     Cervical: No cervical adenopathy.  Skin:    General: Skin is warm and dry.     Findings: No rash.  Neurological:     Mental Status: She is alert and oriented to person, place, and time. Mental status is at baseline.   Psychiatric:        Mood and Affect: Mood normal.        Behavior: Behavior normal.    No results found for any visits on 12/18/20.  Assessment & Plan     1. Neck muscle spasm - tightness of traps and SCDs that is contributing to tension headaches -felexeril, heat, HEP prn - return precautions discussed  2. Adverse effect of vaccine, initial encounter - discussed that redness from shingrix is common - no signs of allergic reaction - symptomatic management as above   No follow-ups on file.      I,Essence Turner,acting as a Education administrator for Lavon Paganini, MD.,have documented all relevant documentation on the behalf of Lavon Paganini, MD,as directed by  Lavon Paganini, MD while in the presence of Lavon Paganini, MD.  I, Lavon Paganini, MD, have reviewed all documentation for this visit. The documentation on 12/18/20 for the exam, diagnosis, procedures, and orders are all accurate and complete.   Blakeleigh Domek, Dionne Bucy, MD, MPH [facility]

## 2020-12-18 NOTE — Telephone Encounter (Signed)
Pt called in c/o having a bad reaction to the shingrix shot.    She was seen on 12/16/2020 (Tuesday) and given the Shingrix shot in her right arm and the pneumococcal shot in left arm. She noticed red splotches  on the right side of her neck and her neck muscles are so sore she can't hardly move.   She's also having migraines.   2 days later now the splotches and muscle soreness have traveled to the other side of her neck and into her back.   She can't hardly move her head.   Her right arm is sore from the Shringrix but nothing out of the ordinary what they told me to expect.   I guess this is a reaction to the Shingrix.    Denies feeling like her throat is closing, no wheezing or shortness of breath.    She's taking Excedrin for the headache.   She gets migraines but this is making the headaches worse and is taking Excedrin daily for the headaches. Her left arm "It's fine".      There are no appts available in [facility].    I'm sending a note to the office to see if they can work her in.   She was agreeable to this plan.  I went over the s/s to go to the ED if they occurred.    Shortness of breath, chest pain, wheezing, throat closing, any swelling in and around her neck, lips, tongue.   She verbalized understanding of these instructions.  I sent my notes high priority to the office.

## 2020-12-18 NOTE — Telephone Encounter (Signed)
Reason for Disposition  [1] Redness or red streak around the injection site AND [2] begins > 48 hours after shot AND [3] fever    Neck muscles extremely sore and having splotches on both sides of neck  Answer Assessment - Initial Assessment Questions 1. SYMPTOMS: "What is the main symptom?" (e.g., redness, swelling, pain)      I saw Dr. Brita Romp and got the shingles shot and the pneumonia shot. I noticed red spots on my neck and my muscles are so sore in my neck.   I assumed it's from the shot now it's moved to the other side of my neck and my back.     It started on the right side the shingles shot it went up into my neck and now it's traveled into my left side of my neck and back.    The muscles in my neck are extremely sore on both sides of my neck.  My right arm is sore but my neck is extremely sore. I tried Tylenol without relief.   Have not tried an antihistamine.    This is causing a migraine.   2. ONSET: "When was the vaccine (shot) given?" "How much later did the Tuesday it was given. Also got a pneumonia shot in left arm.     It's not even sore now in my left arm.    begin?" (e.g., hours, days ago)      severe 3. SEVERITY: "How bad is it?"      Severe 4. FEVER: "Is there a fever?" If Yes, ask: "What is it, how was it measured, and when did it start?"      No 5. IMMUNIZATIONS GIVEN: "What shots have you recently received?"       pneumonia shot and the shingles shot  6. PAST REACTIONS: "Have you reacted to immunizations before?" If Yes, ask: "What happened?"     No 7. OTHER SYMPTOMS: "Do you have any other symptoms?"     My neck muscles are extremely sore.   Headaches.   I'm having to take migraine medicine almost every day.  Excedrin.  Protocols used: Immunization Reactions-A-AH

## 2021-01-03 ENCOUNTER — Other Ambulatory Visit: Payer: Self-pay | Admitting: Family Medicine

## 2021-01-03 DIAGNOSIS — R609 Edema, unspecified: Secondary | ICD-10-CM

## 2021-01-03 NOTE — Telephone Encounter (Signed)
Requested Prescriptions  Pending Prescriptions Disp Refills  . escitalopram (LEXAPRO) 20 MG tablet [Pharmacy Med Name: ESCITALOPRAM 20 MG TABLET] 90 tablet 3    Sig: TAKE 1 TABLET BY MOUTH EVERY DAY     Psychiatry:  Antidepressants - SSRI Passed - 01/03/2021  5:40 AM      Passed - Completed PHQ-2 or PHQ-9 in the last 360 days      Passed - Valid encounter within last 6 months    Recent Outpatient Visits          2 weeks ago Neck muscle spasm   [facility] [place], Dionne Bucy, MD   2 weeks ago Encounter for annual physical exam   [facility] [place], Dionne Bucy, MD   3 months ago [place], Richard L Jr., MD   6 months ago Primary hypertension   [facility] [place], Dionne Bucy, MD   10 months ago Left leg cellulitis   [facility] [place], Dionne Bucy, MD      Future Appointments            In 5 months Bacigalupo, Dionne Bucy, MD [facility]           . cyclobenzaprine (FLEXERIL) 5 MG tablet [Pharmacy Med Name: CYCLOBENZAPRINE 5 MG TABLET] 30 tablet     Sig: TAKE 1 TABLET BY MOUTH THREE TIMES A DAY AS NEEDED FOR MUSCLE SPASMS     Not Delegated - Analgesics:  Muscle Relaxants Failed - 01/03/2021  5:40 AM      Failed - This refill cannot be delegated      Passed - Valid encounter within last 6 months    Recent Outpatient Visits          2 weeks ago Neck muscle spasm   [facility] [place], Dionne Bucy, MD   2 weeks ago Encounter for annual physical exam   [facility] [place], Dionne Bucy, MD   3 months ago [place] Jerrol Banana., MD   6 months ago Primary hypertension   [facility] [place], Dionne Bucy, MD   10 months ago Left leg cellulitis   [facility] [place], Dionne Bucy, MD      Future Appointments            In 5 months Bacigalupo, Dionne Bucy, MD  [facility]           . baclofen (LIORESAL) 10 MG tablet [Pharmacy Med Name: BACLOFEN 10 MG TABLET] 30 tablet     Sig: TAKE 1 TABLET BY MOUTH TWICE A DAY AS NEEDED FOR MUSCLE SPASMS     Not Delegated - Analgesics:  Muscle Relaxants Failed - 01/03/2021  5:40 AM      Failed - This refill cannot be delegated      Passed - Valid encounter within last 6 months    Recent Outpatient Visits          2 weeks ago Neck muscle spasm   [facility] [place], Dionne Bucy, MD   2 weeks ago Encounter for annual physical exam   [facility] [place], Dionne Bucy, MD   3 months ago [place] Jerrol Banana., MD   6 months ago Primary hypertension   [facility] [place], Dionne Bucy, MD   10 months ago Left leg cellulitis   [place],  Dionne Bucy, MD      Future Appointments            In 5 months Bacigalupo, Dionne Bucy, MD [facility]           . omeprazole (PRILOSEC) 40 MG capsule [Pharmacy Med Name: OMEPRAZOLE DR 40 MG CAPSULE] 90 capsule 1    Sig: TAKE 1 CAPSULE (40 MG TOTAL) BY MOUTH DAILY.     Gastroenterology: Proton Pump Inhibitors Passed - 01/03/2021  5:40 AM      Passed - Valid encounter within last 12 months    Recent Outpatient Visits          2 weeks ago Neck muscle spasm   [facility] [place], Dionne Bucy, MD   2 weeks ago Encounter for annual physical exam   [facility] [place], Dionne Bucy, MD   3 months ago [place] Jerrol Banana., MD   6 months ago Primary hypertension   [facility] [place], Dionne Bucy, MD   10 months ago Left leg cellulitis   [facility] [place], Dionne Bucy, MD      Future Appointments            In 5 months Bacigalupo, Dionne Bucy, MD [facility]           . torsemide (DEMADEX) 20 MG  tablet [Pharmacy Med Name: TORSEMIDE 20 MG TABLET] 180 tablet     Sig: TAKE 2 TABLETS BY MOUTH EVERY DAY     Cardiovascular:  Diuretics - Loop Failed - 01/03/2021  5:40 AM      Failed - Cr in normal range and within 360 days    Creat  Date Value Ref Range Status  05/13/2017 0.92 0.50 - 1.05 mg/dL Final    Comment:    For patients >49 years of age, the reference limit for Creatinine is approximately 13% higher for people identified as African-American. .    Creatinine, Ser  Date Value Ref Range Status  09/29/2020 1.10 (H) 0.57 - 1.00 mg/dL Final         Passed - K in normal range and within 360 days    Potassium  Date Value Ref Range Status  09/29/2020 3.7 3.5 - 5.2 mmol/L Final         Passed - Ca in normal range and within 360 days    Calcium  Date Value Ref Range Status  09/29/2020 9.3 8.7 - 10.3 mg/dL Final         Passed - Na in normal range and within 360 days    Sodium  Date Value Ref Range Status  09/29/2020 144 134 - 144 mmol/L Final         Passed - Last BP in normal range    BP Readings from Last 1 Encounters:  12/18/20 (!) 122/58         Passed - Valid encounter within last 6 months    Recent Outpatient Visits          2 weeks ago Neck muscle spasm   [facility] [place], Dionne Bucy, MD   2 weeks ago Encounter for annual physical exam   [facility] [place], Dionne Bucy, MD   3 months ago [place] Jerrol Banana., MD   6 months ago Primary hypertension   [facility] [place], Dionne Bucy, MD   10 months ago Left leg  cellulitis   [facility] [place], Dionne Bucy, MD      Future Appointments            In 5 months Bacigalupo, Dionne Bucy, MD [facility] [place], [place]

## 2021-01-03 NOTE — Telephone Encounter (Signed)
Requested medication (s) are due for refill today: yes to all  Requested medication (s) are on the active medication list: yes  Last refill:  cyclobenzaprine: 12/18/20 #30         Baclofen: 07/03/20 #30 2 RF       Torsemide: 12/12/19 #180 1 RF  Future visit scheduled: yes  Notes to clinic:  cyclobenzaprine and Baclofen= not delegated to NT to RF Torsemide: overdue labs    Requested Prescriptions  Pending Prescriptions Disp Refills   cyclobenzaprine (FLEXERIL) 5 MG tablet [Pharmacy Med Name: CYCLOBENZAPRINE 5 MG TABLET] 30 tablet     Sig: TAKE 1 TABLET BY MOUTH THREE TIMES A DAY AS NEEDED FOR MUSCLE SPASMS      Not Delegated - Analgesics:  Muscle Relaxants Failed - 01/03/2021  5:40 AM      Failed - This refill cannot be delegated      Passed - Valid encounter within last 6 months    Recent Outpatient Visits           2 weeks ago Neck muscle spasm   [facility] [place], Dionne Bucy, MD   2 weeks ago Encounter for annual physical exam   [facility] [place], Dionne Bucy, MD   3 months ago [place] Jerrol Banana., MD   6 months ago Primary hypertension   [facility] [place], Dionne Bucy, MD   10 months ago Left leg cellulitis   [facility] [place], Dionne Bucy, MD       Future Appointments             In 5 months Bacigalupo, Dionne Bucy, MD [facility]               baclofen (LIORESAL) 10 MG tablet [Pharmacy Med Name: BACLOFEN 10 MG TABLET] 30 tablet     Sig: TAKE 1 TABLET BY MOUTH TWICE A DAY AS NEEDED FOR MUSCLE SPASMS      Not Delegated - Analgesics:  Muscle Relaxants Failed - 01/03/2021  5:40 AM      Failed - This refill cannot be delegated      Passed - Valid encounter within last 6 months    Recent Outpatient Visits           2 weeks ago Neck muscle spasm   [facility] [place], Dionne Bucy, MD   2 weeks ago Encounter for annual  physical exam   [facility] [place], Dionne Bucy, MD   3 months ago [place] Jerrol Banana., MD   6 months ago Primary hypertension   [facility] [place], Dionne Bucy, MD   10 months ago Left leg cellulitis   [facility] [place], Dionne Bucy, MD       Future Appointments             In 5 months Bacigalupo, Dionne Bucy, MD [facility]               torsemide (DEMADEX) 20 MG tablet [Pharmacy Med Name: TORSEMIDE 20 MG TABLET] 180 tablet     Sig: TAKE 2 TABLETS BY MOUTH EVERY DAY      Cardiovascular:  Diuretics - Loop Failed - 01/03/2021  5:40 AM      Failed - Cr in normal range and within 360 days    Creat  Date Value Ref Range Status  05/13/2017 0.92 0.50 - 1.05  mg/dL Final    Comment:    For patients >24 years of age, the reference limit for Creatinine is approximately 13% higher for people identified as African-American. .    Creatinine, Ser  Date Value Ref Range Status  09/29/2020 1.10 (H) 0.57 - 1.00 mg/dL Final          Passed - K in normal range and within 360 days    Potassium  Date Value Ref Range Status  09/29/2020 3.7 3.5 - 5.2 mmol/L Final          Passed - Ca in normal range and within 360 days    Calcium  Date Value Ref Range Status  09/29/2020 9.3 8.7 - 10.3 mg/dL Final          Passed - Na in normal range and within 360 days    Sodium  Date Value Ref Range Status  09/29/2020 144 134 - 144 mmol/L Final          Passed - Last BP in normal range    BP Readings from Last 1 Encounters:  12/18/20 (!) 122/58          Passed - Valid encounter within last 6 months    Recent Outpatient Visits           2 weeks ago Neck muscle spasm   [facility] [place], Dionne Bucy, MD   2 weeks ago Encounter for annual physical exam   [facility] [place], Dionne Bucy, MD   3 months ago [place] Jerrol Banana., MD   6 months ago Primary hypertension   [facility] [place], Dionne Bucy, MD   10 months ago Left leg cellulitis   [facility] [place], Dionne Bucy, MD       Future Appointments             In 5 months Bacigalupo, Dionne Bucy, MD [facility]              Signed Prescriptions Disp Refills   escitalopram (LEXAPRO) 20 MG tablet 90 tablet 3    Sig: TAKE 1 TABLET BY MOUTH EVERY DAY      Psychiatry:  Antidepressants - SSRI Passed - 01/03/2021  5:40 AM      Passed - Completed PHQ-2 or PHQ-9 in the last 360 days      Passed - Valid encounter within last 6 months    Recent Outpatient Visits           2 weeks ago Neck muscle spasm   [facility] [place], Dionne Bucy, MD   2 weeks ago Encounter for annual physical exam   [facility] [place], Dionne Bucy, MD   3 months ago [place] Jerrol Banana., MD   6 months ago Primary hypertension   [facility] [place], Dionne Bucy, MD   10 months ago Left leg cellulitis   [facility] [place], Dionne Bucy, MD       Future Appointments             In 5 months Bacigalupo, Dionne Bucy, MD [facility]               omeprazole (PRILOSEC) 40 MG capsule 90 capsule 1    Sig: TAKE 1 CAPSULE (40 MG TOTAL) BY MOUTH DAILY.      Gastroenterology: Proton Pump Inhibitors Passed - 01/03/2021  5:40  AM      Passed - Valid encounter within last 12 months    Recent Outpatient Visits           2 weeks ago Neck muscle spasm   [facility] [place], Dionne Bucy, MD   2 weeks ago Encounter for annual physical exam   [facility] [place], Dionne Bucy, MD   3 months ago [place] Jerrol Banana., MD   6 months ago Primary hypertension   [facility] [place], Dionne Bucy, MD   10  months ago Left leg cellulitis   [facility] [place], Dionne Bucy, MD       Future Appointments             In 5 months Bacigalupo, Dionne Bucy, MD [facility], [place]

## 2021-01-27 DIAGNOSIS — G4733 Obstructive sleep apnea (adult) (pediatric): Secondary | ICD-10-CM | POA: Diagnosis not present

## 2021-02-15 ENCOUNTER — Other Ambulatory Visit: Payer: Self-pay | Admitting: Family Medicine

## 2021-02-15 DIAGNOSIS — R609 Edema, unspecified: Secondary | ICD-10-CM

## 2021-02-15 NOTE — Telephone Encounter (Signed)
Too soon:last RF 01/09/21 #180

## 2021-02-17 ENCOUNTER — Other Ambulatory Visit: Payer: Self-pay | Admitting: Family Medicine

## 2021-02-17 NOTE — Telephone Encounter (Signed)
Requested Prescriptions  Pending Prescriptions Disp Refills  . albuterol (VENTOLIN HFA) 108 (90 Base) MCG/ACT inhaler [Pharmacy Med Name: ALBUTEROL HFA (PROVENTIL) INH] 6.7 each 3    Sig: INHALE 1-2 PUFFS BY MOUTH EVERY 6 HOURS AS NEEDED FOR WHEEZE OR SHORTNESS OF BREATH     Pulmonology:  Beta Agonists Failed - 02/17/2021  3:32 AM      Failed - One inhaler should last at least one month. If the patient is requesting refills earlier, contact the patient to check for uncontrolled symptoms.      Passed - Valid encounter within last 12 months    Recent Outpatient Visits          2 months ago Neck muscle spasm   [facility] [place], Dionne Bucy, MD   2 months ago Encounter for annual physical exam   [facility] [place], Dionne Bucy, MD   4 months ago [place] Jerrol Banana., MD   7 months ago Primary hypertension   [facility] [place], Dionne Bucy, MD   1 year ago Left leg cellulitis   [place], Dionne Bucy, MD      Future Appointments            In 4 months Bacigalupo, Dionne Bucy, MD [facility], [place]

## 2021-03-03 ENCOUNTER — Other Ambulatory Visit: Payer: Self-pay

## 2021-03-03 ENCOUNTER — Ambulatory Visit: Payer: Self-pay | Admitting: *Deleted

## 2021-03-03 MED ORDER — PAXLOVID (300/100) 20 X 150 MG & 10 X 100MG PO TBPK
ORAL_TABLET | ORAL | 0 refills | Status: DC
  Filled 2021-03-03: qty 30, 5d supply, fill #0

## 2021-03-03 NOTE — Telephone Encounter (Signed)
Patient is calling to report she has tested + COVID. Patient states her family member got ill- her husband has been hospitalized and is improving. Patient states she started with her symptoms- 9/7. Patient states her worst symptoms now- fatigue, sore throat. Patient does have congestion. Patient advised per COVID protocol-treatment/isolation. Call sent for review- no virtual visit open today- she is requesting help with her sore throat symptom. Office is closed for lunch- unable to call in.

## 2021-03-03 NOTE — Telephone Encounter (Signed)
Patient advised.

## 2021-03-03 NOTE — Telephone Encounter (Signed)
Pt called in stating she has tested positive for covid and wanted to speak with someone and what she can take, please advise  Reason for Disposition  [1] COVID-19 diagnosed by positive lab test (e.g., PCR, rapid self-test kit) AND [2] mild symptoms (e.g., cough, fever, others) AND [9] no complications or SOB  Answer Assessment - Initial Assessment Questions 1. COVID-19 DIAGNOSIS: "Who made your COVID-19 diagnosis?" "Was it confirmed by a positive lab test or self-test?" If not diagnosed by a doctor (or NP/PA), ask "Are there lots of cases (community spread) where you live?" Note: See public health department website, if unsure.     + COVID home test- Sunday 2. COVID-19 EXPOSURE: "Was there any known exposure to COVID before the symptoms began?" CDC Definition of close contact: within 6 feet (2 meters) for a total of 15 minutes or more over a 24-hour period.      Unsure- grandson was ill- husband-in hospital  3. ONSET: "When did the COVID-19 symptoms start?"      Wednesday-9/7 4. WORST SYMPTOM: "What is your worst symptom?" (e.g., cough, fever, shortness of breath, muscle aches)     fatigue 5. COUGH: "Do you have a cough?" If Yes, ask: "How bad is the cough?"       Yes- slight 6. FEVER: "Do you have a fever?" If Yes, ask: "What is your temperature, how was it measured, and when did it start?"     Not any more- couple days ago- fever 7. RESPIRATORY STATUS: "Describe your breathing?" (e.g., shortness of breath, wheezing, unable to speak)      Ok- no SOB, patient gets very fatigued 8. BETTER-SAME-WORSE: "Are you getting better, staying the same or getting worse compared to yesterday?"  If getting worse, ask, "In what way?"     Same 9. HIGH RISK DISEASE: "Do you have any chronic medical problems?" (e.g., asthma, heart or lung disease, weak immune system, obesity, etc.)     Hx bronchitis  10. VACCINE: "Have you had the COVID-19 vaccine?" If Yes, ask: "Which one, how many shots, when did you get  it?"       Yes- J&J 11. BOOSTER: "Have you received your COVID-19 booster?" If Yes, ask: "Which one and when did you get it?"       20 22- booster 12. PREGNANCY: "Is there any chance you are pregnant?" "When was your last menstrual period?"       N/a 13. OTHER SYMPTOMS: "Do you have any other symptoms?"  (e.g., chills, fatigue, headache, loss of smell or taste, muscle pain, sore throat)       Sore throat, nasal congestion, chest congestion 14. O2 SATURATION MONITOR:  "Do you use an oxygen saturation monitor (pulse oximeter) at home?" If Yes, ask "What is your reading (oxygen level) today?" "What is your usual oxygen saturation reading?" (e.g., 95%)       Yes- 94%  Protocols used: Coronavirus (COVID-19) Diagnosed or Suspected-A-AH

## 2021-03-04 ENCOUNTER — Other Ambulatory Visit: Payer: Self-pay

## 2021-03-04 ENCOUNTER — Telehealth: Payer: Self-pay

## 2021-03-04 NOTE — Telephone Encounter (Signed)
Outpatient Pharmacy Oral COVID Treatment Note  I connected with Rachael Brewer on 03/04/2021/10:31 AM by telephone and verified that I am speaking with the correct person using two identifiers.  I discussed the limitations, risks, security, and privacy concerns of performing an evaluation and management service by telephone and the availability of in person appointments via referral to a physician. The patient expressed understanding and agreed to proceed.  Pharmacy location: [facility]  Diagnosis: COVID-19 infection  Purpose of visit: Discussion of potential use of Paxlovid, a new treatment for mild to moderate COVID-19 viral infection in non-hospitalized patients.  Subjective/Objective: Patient is a 60 y.o. female who is presenting with COVID 19 viral infection.  COVID 19 viral infection. Their symptoms began on 03/01/21 with cold symptoms.  The patient has confirmed COVID-19 via a home test.   Past Medical History:  Diagnosis Date   Anxiety    Arthritis    Complication of anesthesia    CAUSES MIGRAINE   Depression    Edema of both lower extremities    GERD (gastroesophageal reflux disease)    Migraines    Pre-diabetes    Sleep apnea    CPAP     Allergies  Allergen Reactions   Bactrim [Sulfamethoxazole-Trimethoprim] Shortness Of Breath and Swelling   Sulfa Antibiotics Anaphylaxis   Penicillins Hives    Did it involve swelling of the face/tongue/throat, SOB, or low BP? No Did it involve sudden or severe rash/hives, skin peeling, or any reaction on the inside of your mouth or nose? No Did you need to seek medical attention at a hospital or doctor's office? No When did it last happen?     10 years ago  If all above answers are "NO", may proceed with cephalosporin use.  Did it involve swelling of the face/tongue/throat, SOB, or low BP? No Did it involve sudden or severe rash/hives, skin peeling, or any reaction on the inside of your mouth or nose? No Did you  need to seek medical attention at a hospital or doctor's office? No When did it last happen?     10 years ago  If all above answers are "NO", may proceed with cephalosporin use.   Sumatriptan     Made migraine more intense and scalp felt like it was on fire     Current Outpatient Medications:    baclofen (LIORESAL) 10 MG tablet, TAKE 1 TABLET BY MOUTH TWICE A DAY AS NEEDED FOR MUSCLE SPASMS, Disp: 30 tablet, Rfl: 0   cyclobenzaprine (FLEXERIL) 5 MG tablet, TAKE 1 TABLET BY MOUTH THREE TIMES A DAY AS NEEDED FOR MUSCLE SPASMS, Disp: 30 tablet, Rfl: 0   torsemide (DEMADEX) 20 MG tablet, TAKE 2 TABLETS BY MOUTH EVERY DAY, Disp: 180 tablet, Rfl: 0   albuterol (VENTOLIN HFA) 108 (90 Base) MCG/ACT inhaler, INHALE 1-2 PUFFS BY MOUTH EVERY 6 HOURS AS NEEDED FOR WHEEZE OR SHORTNESS OF BREATH, Disp: 6.7 each, Rfl: 3   aspirin-acetaminophen-caffeine (EXCEDRIN MIGRAINE) 250-250-65 MG tablet, Take 2 tablets by mouth every 8 (eight) hours as needed for migraine. , Disp: , Rfl:    Azelastine HCl 137 MCG/SPRAY SOLN, Place 1 spray into both nostrils at bedtime., Disp: , Rfl:    buPROPion (WELLBUTRIN) 100 MG tablet, TAKE 1 TABLET BY MOUTH TWICE A DAY, Disp: 180 tablet, Rfl: 0   escitalopram (LEXAPRO) 20 MG tablet, TAKE 1 TABLET BY MOUTH EVERY DAY, Disp: 90 tablet, Rfl: 3   fluticasone (FLONASE) 50 MCG/ACT nasal spray, Place 2 sprays into  both nostrils daily., Disp: , Rfl:    gabapentin (NEURONTIN) 600 MG tablet, Take 1,200 mg by mouth at bedtime. , Disp: , Rfl:    Multiple Vitamin (MULTIVITAMIN WITH MINERALS) TABS tablet, Take 1 tablet by mouth daily., Disp: , Rfl:    nirmatrelvir & ritonavir (PAXLOVID, 300/100,) 20 x 150 MG & 10 x 100MG TBPK, Take 2 nirmatrelvir (150 mg) tablets and take 1 ritonavir (100 mg) tablet (3 tablets total) by mouth two times daily for 5 days, Disp: 30 tablet, Rfl: 0   omeprazole (PRILOSEC) 40 MG capsule, TAKE 1 CAPSULE (40 MG TOTAL) BY MOUTH DAILY., Disp: 90 capsule, Rfl: 1   Potassium  Chloride 40 MEQ/15ML (20%) SOLN, Take 40 mEq by mouth daily., Disp: 473 mL, Rfl: 3   rizatriptan (MAXALT) 10 MG tablet, Take 10 mg by mouth every 2 (two) hours as needed for migraine (max 2 tablets/24 hrs.). , Disp: , Rfl: 0   topiramate ER (QUDEXY XR) 150 MG CS24 sprinkle capsule, Take 150 mg by mouth at bedtime. , Disp: , Rfl:   Lab Monitoring: eGFR 67 on 09/29/20  Drug Interactions Noted: Patient will stop Flonase nasal spray while taking Paxlovid.  Plan:  This patient is a 60 y.o. female that meets the criteria for Emergency Use Authorization of Paxlovid. After reviewing the emergency use authorization with the patient, the patient agrees to receive Paxlovid.  Through FDA guidance and current [facility] standing order Paxlovid will be prescribed to the patient.   Patient contacted for counseling on 03/03/21 and verbalized understanding.   Delivery or Pick-Up Date: 03/04/21  Follow up instructions:    Take prescription BID x 5 days as directed Counseling was provided by pharmacist. Reach out to pharmacist with follow up questions For concerns regarding further COVID symptoms please follow up with your PCP or urgent care For urgent or life-threatening issues, seek care at your local emergency department   Carolynne Edouard 03/04/2021, 10:31 AM [place] Pharmacist Phone# [phone]

## 2021-03-30 ENCOUNTER — Ambulatory Visit: Payer: Self-pay | Admitting: Neurology

## 2021-04-01 ENCOUNTER — Other Ambulatory Visit: Payer: Self-pay | Admitting: Family Medicine

## 2021-04-01 DIAGNOSIS — R609 Edema, unspecified: Secondary | ICD-10-CM

## 2021-04-02 NOTE — Telephone Encounter (Signed)
Requested Prescriptions  Pending Prescriptions Disp Refills  . torsemide (DEMADEX) 20 MG tablet [Pharmacy Med Name: TORSEMIDE 20 MG TABLET] 180 tablet 0    Sig: TAKE 2 TABLETS BY MOUTH EVERY DAY     Cardiovascular:  Diuretics - Loop Failed - 04/01/2021 10:12 PM      Failed - Cr in normal range and within 360 days    Creat  Date Value Ref Range Status  05/13/2017 0.92 0.50 - 1.05 mg/dL Final    Comment:    For patients >61 years of age, the reference limit for Creatinine is approximately 13% higher for people identified as African-American. .    Creatinine, Ser  Date Value Ref Range Status  09/29/2020 1.10 (H) 0.57 - 1.00 mg/dL Final         Passed - K in normal range and within 360 days    Potassium  Date Value Ref Range Status  09/29/2020 3.7 3.5 - 5.2 mmol/L Final         Passed - Ca in normal range and within 360 days    Calcium  Date Value Ref Range Status  09/29/2020 9.3 8.7 - 10.3 mg/dL Final         Passed - Na in normal range and within 360 days    Sodium  Date Value Ref Range Status  09/29/2020 144 134 - 144 mmol/L Final         Passed - Last BP in normal range    BP Readings from Last 1 Encounters:  12/18/20 (!) 122/58         Passed - Valid encounter within last 6 months    Recent Outpatient Visits          3 months ago Neck muscle spasm   [facility] [place], Dionne Bucy, MD   3 months ago Encounter for annual physical exam   [facility] [place], Dionne Bucy, MD   6 months ago [place] Jerrol Banana., MD   9 months ago Primary hypertension   [facility] [place], Dionne Bucy, MD   1 year ago Left leg cellulitis   [place], Dionne Bucy, MD      Future Appointments            In 2 months Bacigalupo, Dionne Bucy, MD [facility], [place]

## 2021-04-21 DIAGNOSIS — H52223 Regular astigmatism, bilateral: Secondary | ICD-10-CM | POA: Diagnosis not present

## 2021-04-21 DIAGNOSIS — H524 Presbyopia: Secondary | ICD-10-CM | POA: Diagnosis not present

## 2021-04-21 DIAGNOSIS — H5203 Hypermetropia, bilateral: Secondary | ICD-10-CM | POA: Diagnosis not present

## 2021-04-21 DIAGNOSIS — H40013 Open angle with borderline findings, low risk, bilateral: Secondary | ICD-10-CM | POA: Diagnosis not present

## 2021-04-30 ENCOUNTER — Telehealth: Payer: Self-pay | Admitting: Neurology

## 2021-04-30 NOTE — Telephone Encounter (Signed)
Appt on 11/22 cancelled due to Md out, home phone busy, LVM on cell, sent mychart msg informing pt.

## 2021-05-12 ENCOUNTER — Ambulatory Visit: Payer: BC Managed Care – PPO | Admitting: Neurology

## 2021-06-25 NOTE — Progress Notes (Deleted)
Established patient visit   Patient: Rachael Brewer   DOB: January 03, 1961   61 y.o. Female  MRN: [id number] Visit Date: 06/26/2021  Today's healthcare provider: Lavon Paganini, MD   No chief complaint on file.  Subjective    HPI  Diabetes Mellitus Type II, follow-up  Lab Results  Component Value Date   HGBA1C 6.2 (H) 09/29/2020   HGBA1C 6.0 (H) 07/03/2020   HGBA1C 6.0 (H) 12/19/2019   Last seen for diabetes {1-12:18279} {days/wks/mos/yrs:310907} ago.  Management since then includes {management:2::"continuing the same treatment."} She reports {excellent/good/fair/poor:19665} compliance with treatment. She {is/is not:21021397} having side effects. {document side effects if present:1}  Home blood sugar records: {diabetes glucometry results:16657}  Episodes of hypoglycemia? {Yes/No:20286} {enter details if yes:1}   Current insulin regiment: {***Type 'None' if not taking insulin                                                otherwise enter complete                                                 details of insulin regiment:1} Most Recent Eye Exam: ***  --------------------------------------------------------------------------------------------------- Hypertension, follow-up  BP Readings from Last 3 Encounters:  12/18/20 (!) 122/58  12/16/20 129/67  10/14/20 122/78   Wt Readings from Last 3 Encounters:  12/18/20 255 lb (115.7 kg)  12/16/20 255 lb 1.6 oz (115.7 kg)  10/14/20 253 lb 5 oz (114.9 kg)     She was last seen for hypertension {NUMBERS 1-12:18279} {days/wks/mos/yrs:310907} ago.  BP at that visit was ***. Management since that visit includes ***. She reports {excellent/good/fair/poor:19665} compliance with treatment. She {is/is not:9024} having side effects. {document side effects if present:1} She {is/is not:9024} exercising. She {is/is not:9024} adherent to low salt diet.   Outside blood pressures are {enter patient reported home BP, or 'not being  checked':1}.    Medications: Outpatient Medications Prior to Visit  Medication Sig   baclofen (LIORESAL) 10 MG tablet TAKE 1 TABLET BY MOUTH TWICE A DAY AS NEEDED FOR MUSCLE SPASMS   cyclobenzaprine (FLEXERIL) 5 MG tablet TAKE 1 TABLET BY MOUTH THREE TIMES A DAY AS NEEDED FOR MUSCLE SPASMS   albuterol (VENTOLIN HFA) 108 (90 Base) MCG/ACT inhaler INHALE 1-2 PUFFS BY MOUTH EVERY 6 HOURS AS NEEDED FOR WHEEZE OR SHORTNESS OF BREATH   aspirin-acetaminophen-caffeine (EXCEDRIN MIGRAINE) 250-250-65 MG tablet Take 2 tablets by mouth every 8 (eight) hours as needed for migraine.    Azelastine HCl 137 MCG/SPRAY SOLN Place 1 spray into both nostrils at bedtime.   buPROPion (WELLBUTRIN) 100 MG tablet TAKE 1 TABLET BY MOUTH TWICE A DAY   escitalopram (LEXAPRO) 20 MG tablet TAKE 1 TABLET BY MOUTH EVERY DAY   fluticasone (FLONASE) 50 MCG/ACT nasal spray Place 2 sprays into both nostrils daily.   gabapentin (NEURONTIN) 600 MG tablet Take 1,200 mg by mouth at bedtime.    Multiple Vitamin (MULTIVITAMIN WITH MINERALS) TABS tablet Take 1 tablet by mouth daily.   nirmatrelvir & ritonavir (PAXLOVID, 300/100,) 20 x 150 MG & 10 x 100MG  TBPK Take 2 nirmatrelvir (150 mg) tablets and take 1 ritonavir (100 mg) tablet (3 tablets total) by mouth two times daily for  5 days   omeprazole (PRILOSEC) 40 MG capsule TAKE 1 CAPSULE (40 MG TOTAL) BY MOUTH DAILY.   Potassium Chloride 40 MEQ/15ML (20%) SOLN Take 40 mEq by mouth daily.   rizatriptan (MAXALT) 10 MG tablet Take 10 mg by mouth every 2 (two) hours as needed for migraine (max 2 tablets/24 hrs.).    topiramate ER (QUDEXY XR) 150 MG CS24 sprinkle capsule Take 150 mg by mouth at bedtime.    torsemide (DEMADEX) 20 MG tablet TAKE 2 TABLETS BY MOUTH EVERY DAY   No facility-administered medications prior to visit.    Review of Systems  {Labs   Heme   Chem   Endocrine   Serology   Results Review (optional):23779}   Objective    LMP  (LMP Unknown)  {Show previous vital  signs (optional):23777}  Physical Exam  ***  No results found for any visits on 06/26/21.  Assessment & Plan     ***  No follow-ups on file.      {provider attestation***:1}   Lavon Paganini, MD  [facility] [phone] (phone) [phone] (fax)  [place]

## 2021-06-26 ENCOUNTER — Ambulatory Visit: Payer: Self-pay | Admitting: Family Medicine

## 2021-07-11 ENCOUNTER — Other Ambulatory Visit: Payer: Self-pay | Admitting: Family Medicine

## 2021-07-11 DIAGNOSIS — R609 Edema, unspecified: Secondary | ICD-10-CM

## 2021-07-11 NOTE — Telephone Encounter (Signed)
Requested Prescriptions  Pending Prescriptions Disp Refills   torsemide (DEMADEX) 20 MG tablet [Pharmacy Med Name: TORSEMIDE 20 MG TABLET] 180 tablet 0    Sig: TAKE 2 TABLETS BY MOUTH EVERY DAY     Cardiovascular:  Diuretics - Loop Failed - 07/11/2021  3:10 AM      Failed - Cr in normal range and within 360 days    Creat  Date Value Ref Range Status  05/13/2017 0.92 0.50 - 1.05 mg/dL Final    Comment:    For patients >62 years of age, the reference limit for Creatinine is approximately 13% higher for people identified as African-American. .    Creatinine, Ser  Date Value Ref Range Status  09/29/2020 1.10 (H) 0.57 - 1.00 mg/dL Final         Failed - Valid encounter within last 6 months    Recent Outpatient Visits          6 months ago Neck muscle spasm   [facility] [place], Dionne Bucy, MD   6 months ago Encounter for annual physical exam   [facility] [place], Dionne Bucy, MD   9 months ago [place] Jerrol Banana., MD   1 year ago Primary hypertension   [facility] [place], Dionne Bucy, MD   1 year ago Left leg cellulitis   [facility] [place], MD             Passed - K in normal range and within 360 days    Potassium  Date Value Ref Range Status  09/29/2020 3.7 3.5 - 5.2 mmol/L Final         Passed - Ca in normal range and within 360 days    Calcium  Date Value Ref Range Status  09/29/2020 9.3 8.7 - 10.3 mg/dL Final         Passed - Na in normal range and within 360 days    Sodium  Date Value Ref Range Status  09/29/2020 144 134 - 144 mmol/L Final         Passed - Last BP in normal range    BP Readings from Last 1 Encounters:  12/18/20 (!) 122/58

## 2021-07-28 IMAGING — CR DG CHEST 2V
1 series · 2 of 2 positions shown · non-contrast
Comparison: Chest radiograph dated 06/05/2012

CLINICAL DATA: Fever

EXAM:
CHEST - 2 VIEW

[Series 1: dg chest 2 view · 0.14mm/px · 2 of 2 slices shown]
[im 1/2]
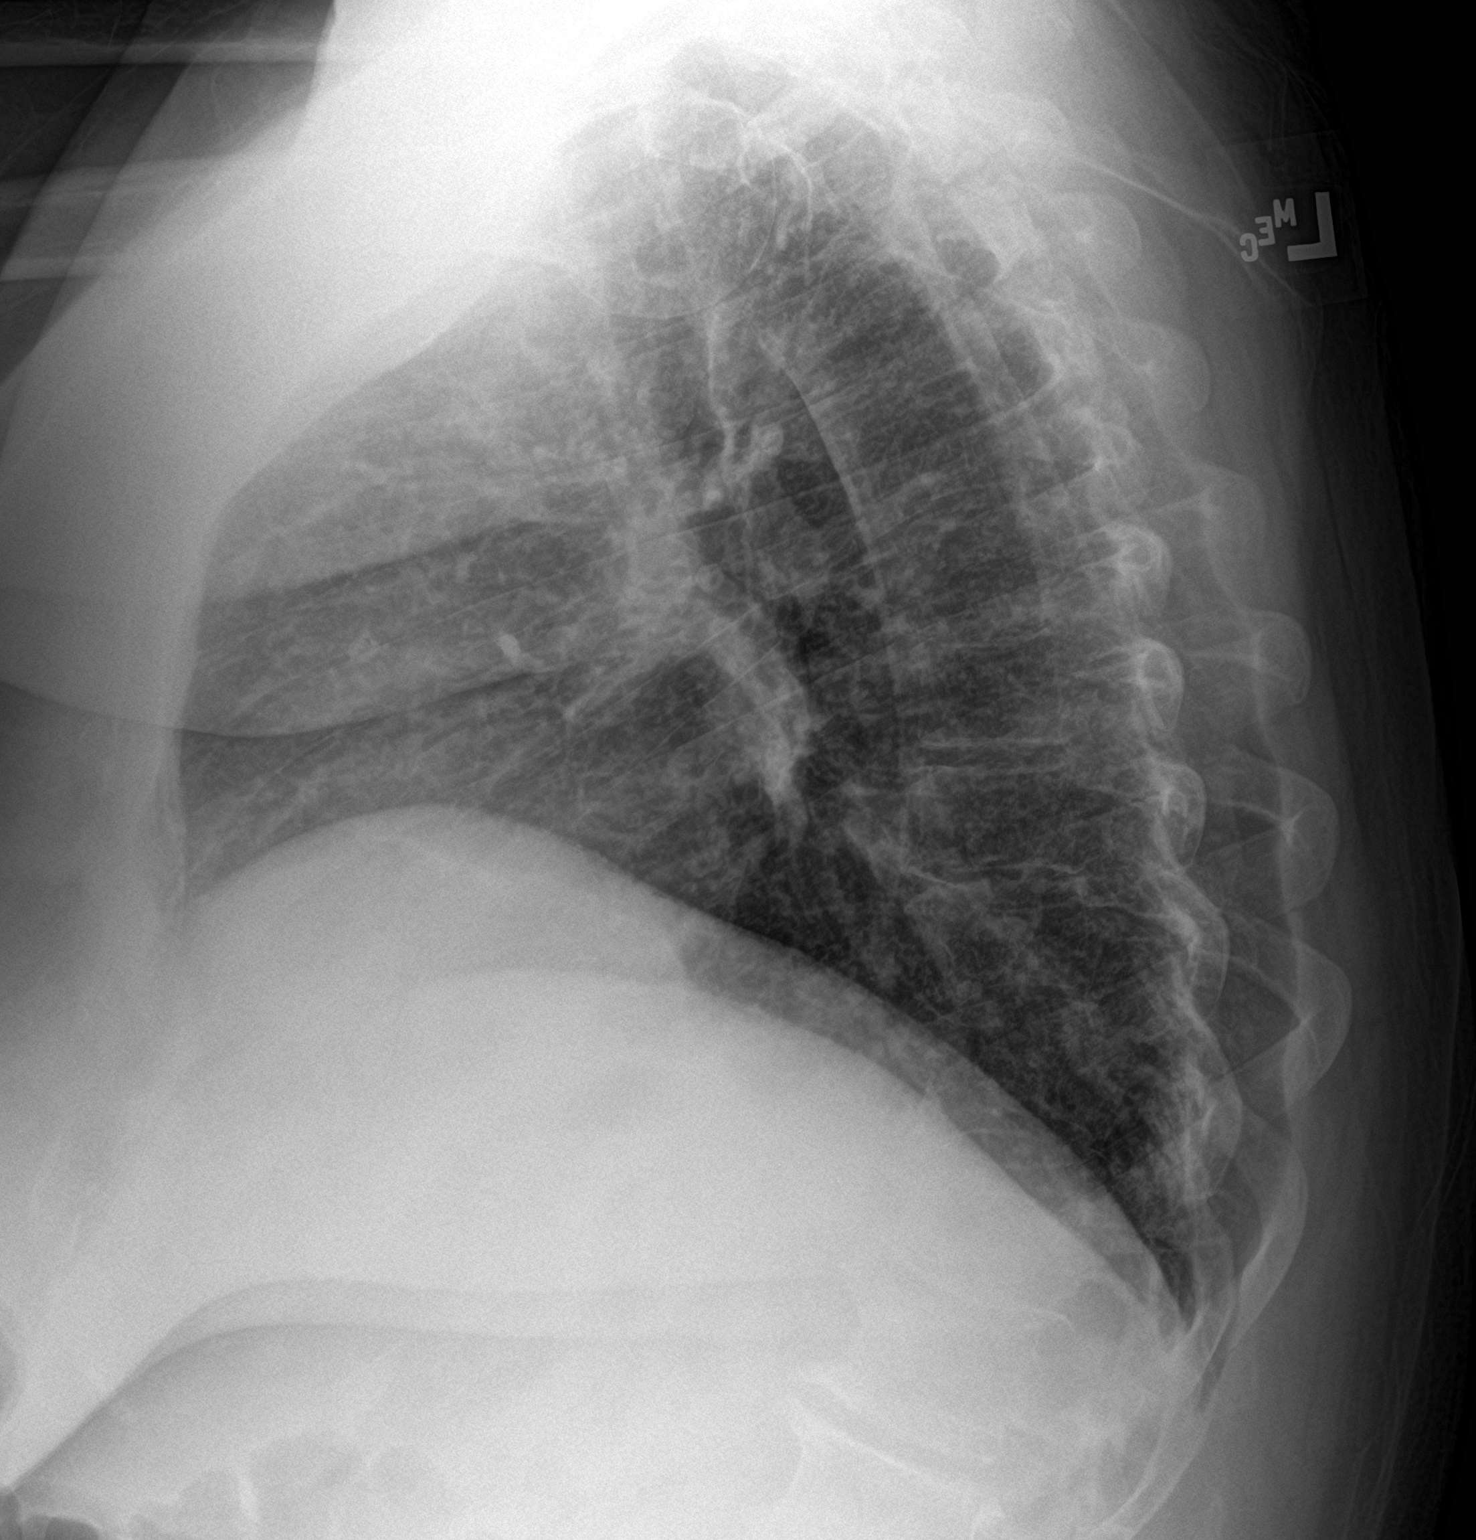
[im 2/2]
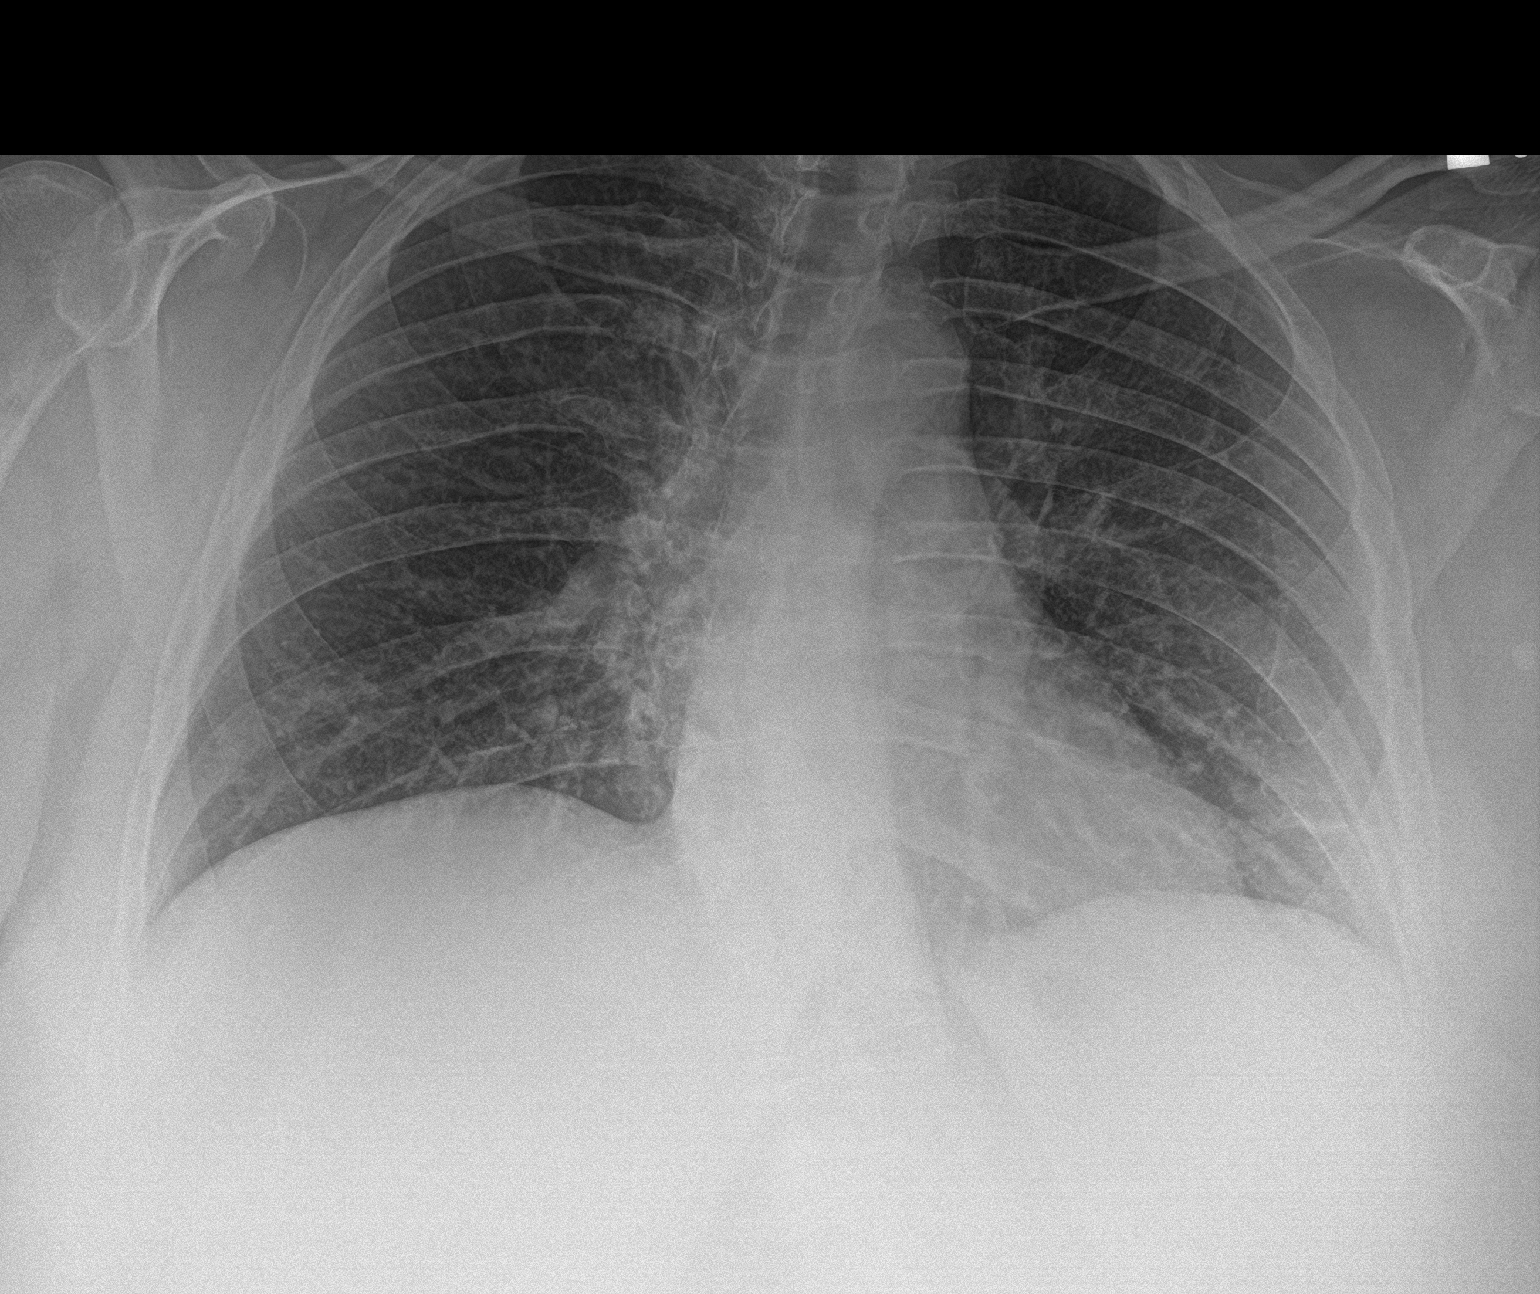

[2 of 2 positions shown; findings below may reference images not displayed]

FINDINGS: The heart size and mediastinal contours are within normal limits.
Both lungs are clear. There is a thoracic dextrocurvature of the
spine.
IMPRESSION: No active cardiopulmonary disease.

## 2021-08-17 ENCOUNTER — Telehealth: Payer: Self-pay

## 2021-08-17 NOTE — Telephone Encounter (Signed)
I can see her for headaches, thanks

## 2021-08-17 NOTE — Telephone Encounter (Signed)
Ok with switch  

## 2021-08-17 NOTE — Telephone Encounter (Signed)
We received a new referral for a TOC from [facility] for a patient of Dr Tori Milks, last seen in 2022. The TOC is for headaches and the patient is requesting to see Dr Billey Gosling.  Can you please advise if this switch is acceptable?  Thanks!

## 2021-09-08 ENCOUNTER — Encounter: Payer: Self-pay | Admitting: *Deleted

## 2021-09-09 ENCOUNTER — Telehealth: Payer: Self-pay | Admitting: *Deleted

## 2021-09-09 ENCOUNTER — Encounter: Payer: Self-pay | Admitting: Psychiatry

## 2021-09-09 ENCOUNTER — Other Ambulatory Visit: Payer: Self-pay | Admitting: Psychiatry

## 2021-09-09 ENCOUNTER — Ambulatory Visit: Payer: 59 | Admitting: Psychiatry

## 2021-09-09 VITALS — BP 136/73 | HR 64 | Ht 63.0 in | Wt 257.0 lb

## 2021-09-09 DIAGNOSIS — M542 Cervicalgia: Secondary | ICD-10-CM

## 2021-09-09 DIAGNOSIS — G43019 Migraine without aura, intractable, without status migrainosus: Secondary | ICD-10-CM

## 2021-09-09 MED ORDER — TOPIRAMATE ER 100 MG PO SPRINKLE CAP24: EXTENDED_RELEASE_CAPSULE | ORAL | 0 refills | Status: DC

## 2021-09-09 MED ORDER — TOPIRAMATE ER 50 MG PO SPRINKLE CAP24: EXTENDED_RELEASE_CAPSULE | ORAL | 0 refills | Status: DC

## 2021-09-09 MED ORDER — BACLOFEN 10 MG PO TABS
10.0000 mg | ORAL_TABLET | Freq: Two times a day (BID) | ORAL | 3 refills | Status: DC | PRN
Start: 2021-09-09 — End: 2021-10-22

## 2021-09-09 NOTE — Patient Instructions (Addendum)
Take 50 mg of topiramate at bedtime for 5 days, then take 100 mg (two 50 mg pills) at bedtime for 5 days, then take 150 mg at bedtime ?Referral to Physical therapy for neck pain and tension ?

## 2021-09-09 NOTE — Telephone Encounter (Signed)
She needs to slowly increase her dose, so I'll have to send in a different prescription for each week (50 mg pills first week, 100 mg pills second week, 150 mg pills after that). She already has 150 mg pills at home. I'll update the Rx

## 2021-09-09 NOTE — Progress Notes (Signed)
? ?Referring:  ?Orie Rout, MD ?[address] ?[place],  [place] [address] ? ?PCP: ?Virginia Crews, MD ? ?Neurology was asked to evaluate Rachael Brewer, a 61 year old female for a chief complaint of headaches.  Our recommendations of care will be communicated by shared medical record.   ? ?CC:  headaches ? ?History provided from self and grandson ? ?HPI:  ?Medical co-morbidities: anxiety, depression, OSA ? ?The patient presents for evaluation of migraines which began when she was 61 years old. She is here to transfer care due to change in insurance. ? ?Takes Qudexy 150 mg daily and gabapentin 600 mg BID for headache prevention. She has been on these medications for at least 8 years. Ran out of Topamax 3-4 weeks ago and headaches became daily. Just restarted this 4-5 days ago and headaches are slowly starting to improve, however she is very sleepy and has been falling asleep in the middle of the day. Currently has 1-2 migraines per week. They are described as occipital or temporal aching with associated photophobia, phonophobia, and nausea. They can last several hours at a time without medication. ? ?She takes Excedrin as needed which works the best for her for rescue. Will sometimes use Maxalt for rescue if Excedrin doesn't work. She has significant neck tension and takes baclofen as needed for muscle spasms. ? ?Headache History: ?Onset: 61 years old ?Triggers: neck pain ?Aura: no ?Location: bilateral occiput, temples, frontal ?Quality/Description: aching, sore ?Associated Symptoms: ? Photophobia: yes ? Phonophobia: yes ? Nausea: yes ?Worse with activity?: yes ?Duration of headaches: 20-30 minutes with medication, several hours without ? ?Headache days per month: 8 ?Headache free days per month: 12 ? ?Current Treatment: ?Abortive ?Excedrin ?Maxalt 10 mg RN ? ?Preventative ?Qudexy 150 mg QHS ?Gabapentin 600 mg BID ? ?Prior Therapies                                 ?Excedrin ?BC  powder ?[place] ?Frovatriptan ?Imitrex ?Maxalt ?Relpax ?Zomig ?Reglan ?Baclofen  ?Flexeril ?Gabapentin ?Topamax ?Citalopram ?Escitalopram ?Sertraline ?Paroxetine ?Occipital nerve block ?Trigger point injections ? ?LABS: ? ?  Latest Ref Rng & Units 09/29/2020  ?  3:20 PM 01/03/2020  ?  9:06 AM 12/26/2019  ?  4:26 AM  ?CBC  ?WBC 3.4 - 10.8 x10E3/uL 6.8   6.3   9.3    ?Hemoglobin 11.1 - 15.9 g/dL 15.9   15.4   12.7    ?Hematocrit 34.0 - 46.6 % 47.0   45.9   37.5    ?Platelets 150 - 450 x10E3/uL 269   346   211    ? ? ?  Latest Ref Rng & Units 09/29/2020  ?  3:20 PM 07/03/2020  ? 11:34 AM 02/20/2020  ?  8:03 AM  ?CMP  ?Glucose 65 - 99 mg/dL 126   99   108    ?BUN 8 - 27 mg/dL '16   18   26    '$ ?Creatinine 0.57 - 1.00 mg/dL 1.10   0.95   1.10    ?Sodium 134 - 144 mmol/L 144   140   139    ?Potassium 3.5 - 5.2 mmol/L 3.7   3.9   3.8    ?Chloride 96 - 106 mmol/L 103   105   101    ?CO2 20 - 29 mmol/L '20   23   24    '$ ?Calcium 8.7 - 10.3 mg/dL 9.3  9.0   9.3    ?Total Protein 6.0 - 8.5 g/dL 6.9      ?Total Bilirubin 0.0 - 1.2 mg/dL 0.3      ?Alkaline Phos 44 - 121 IU/L 77      ?AST 0 - 40 IU/L 28      ?ALT 0 - 32 IU/L 23      ? ? ? ?IMAGING:  ?MRI brain 10/27/20: no acute intracranial abnormality ? ?Imaging independently reviewed on September 09, 2021  ? ?Current Outpatient Medications on File Prior to Visit  ?Medication Sig Dispense Refill  ? albuterol (VENTOLIN HFA) 108 (90 Base) MCG/ACT inhaler INHALE 1-2 PUFFS BY MOUTH EVERY 6 HOURS AS NEEDED FOR WHEEZE OR SHORTNESS OF BREATH 6.7 each 3  ? aspirin-acetaminophen-caffeine (EXCEDRIN MIGRAINE) 250-250-65 MG tablet Take 2 tablets by mouth every 8 (eight) hours as needed for migraine.     ? Azelastine HCl 137 MCG/SPRAY SOLN Place 1 spray into both nostrils at bedtime.    ? buPROPion (WELLBUTRIN) 100 MG tablet TAKE 1 TABLET BY MOUTH TWICE A DAY 180 tablet 0  ? escitalopram (LEXAPRO) 20 MG tablet TAKE 1 TABLET BY MOUTH EVERY DAY 90 tablet 3  ? fluticasone (FLONASE) 50 MCG/ACT nasal spray Place  2 sprays into both nostrils daily.    ? gabapentin (NEURONTIN) 600 MG tablet Take 1,200 mg by mouth at bedtime.     ? Multiple Vitamin (MULTIVITAMIN WITH MINERALS) TABS tablet Take 1 tablet by mouth daily.    ? omeprazole (PRILOSEC) 40 MG capsule TAKE 1 CAPSULE (40 MG TOTAL) BY MOUTH DAILY. 90 capsule 1  ? rizatriptan (MAXALT) 10 MG tablet Take 10 mg by mouth every 2 (two) hours as needed for migraine (max 2 tablets/24 hrs.).   0  ? topiramate ER (QUDEXY XR) 150 MG CS24 sprinkle capsule Take 150 mg by mouth at bedtime.     ? torsemide (DEMADEX) 20 MG tablet TAKE 2 TABLETS BY MOUTH EVERY DAY 180 tablet 0  ? Potassium Chloride 40 MEQ/15ML (20%) SOLN Take 40 mEq by mouth daily. (Patient not taking: Reported on 09/09/2021) 473 mL 3  ? ?No current facility-administered medications on file prior to visit.  ? ? ? ?Allergies: ?Allergies  ?Allergen Reactions  ? Bactrim [Sulfamethoxazole-Trimethoprim] Shortness Of Breath and Swelling  ? Sulfa Antibiotics Anaphylaxis  ? Penicillins Hives  ?  Did it involve swelling of the face/tongue/throat, SOB, or low BP? No ?Did it involve sudden or severe rash/hives, skin peeling, or any reaction on the inside of your mouth or nose? No ?Did you need to seek medical attention at a hospital or doctor's office? No ?When did it last happen?     10 years ago  ?If all above answers are "NO", may proceed with cephalosporin use. ? ?Did it involve swelling of the face/tongue/throat, SOB, or low BP? No ?Did it involve sudden or severe rash/hives, skin peeling, or any reaction on the inside of your mouth or nose? No ?Did you need to seek medical attention at a hospital or doctor's office? No ?When did it last happen?     10 years ago  ?If all above answers are "NO", may proceed with cephalosporin use.  ? Sumatriptan   ?  Made migraine more intense and scalp felt like it was on fire  ? ? ?Family History: ?Family History  ?Problem Relation Age of Onset  ? Hypertension Brother   ? Diabetes Brother   ?  Diabetes Other   ? Hypertension Other   ?  Glaucoma Other   ? Breast cancer Maternal Aunt   ?     44's  ? ? ? ?Past Medical History: ?Past Medical History:  ?Diagnosis Date  ? Anxiety   ? Arthritis   ? Complication of anesthesia   ? CAUSES MIGRAINE  ? Depression   ? Diabetes ([place])   ? Edema of both lower extremities   ? GERD (gastroesophageal reflux disease)   ? Headache   ? Menetrier disease   ? Migraines   ? Pre-diabetes   ? Sleep apnea   ? CPAP  ? ? ?Past Surgical History ?Past Surgical History:  ?Procedure Laterality Date  ? ANTERIOR CRUCIATE LIGAMENT REPAIR Right 1996  ? DILATION AND CURETTAGE OF UTERUS  2004  ? FOOT SURGERY Bilateral   ? JOINT REPLACEMENT Right   ? KNEE ARTHROPLASTY Right 05/02/2019  ? Procedure: COMPUTER ASSISTED TOTAL KNEE ARTHROPLASTY;  Surgeon: Dereck Leep, MD;  Location: [facility] ORS;  Service: Orthopedics;  Laterality: Right;  ? KNEE ARTHROPLASTY Left 07/09/2019  ? Procedure: COMPUTER ASSISTED TOTAL KNEE ARTHROPLASTY;  Surgeon: Dereck Leep, MD;  Location: [facility] OR[facility];  Service: Orthopedics;  Laterality: Left;  ? KNEE SURGERY Right   ? MENISCUS REPAIR Bilateral   ? ? ?Social History: ?Social History  ? ?Tobacco Use  ? Smoking status: Former  ?  Packs/day: 1.00  ?  Years: 15.00  ?  Pack years: 15.00  ?  Types: Cigarettes  ?  Quit date: 06/21/1999  ?  Years since quitting: 22.2  ? Smokeless tobacco: Never  ?Vaping Use  ? Vaping Use: Never used  ?Substance Use Topics  ? Alcohol use: Yes  ?  Alcohol/week: 0.0 standard drinks  ?  Comment: occasional  ? Drug use: No  ? ? ? ?ROS: ?Negative for fevers, chills. Positive for headaches. All other systems reviewed and negative unless stated otherwise in HPI. ? ? ?Physical Exam:  ? ?Vital Signs: ?BP 136/73   Pulse 64   Ht '5\' 3"'$  (1.6 m)   Wt 257 lb (116.6 kg)   LMP  (LMP Unknown)   BMI 45.53 kg/m?  ?GENERAL: well appearing,in no acute distress,alert ?SKIN:  Color, texture, turgor normal. No rashes or lesions ?HEAD:  Normocephalic/atraumatic. ?CV:   RRR ?RESP: Normal respiratory effort ?MSK: +tenderness to palpation over bilateral occiput, neck, and shoulders ? ?NEUROLOGICAL: ?Mental Status: Alert, oriented to person, place and time,Follows commands ?Cranial Nerves: PERRL, vi

## 2021-09-09 NOTE — Telephone Encounter (Signed)
Faxed note to pharmacy advising MD is sending in new Rxs.  ?

## 2021-09-09 NOTE — Telephone Encounter (Signed)
Received fax form [facility] stating topiramate ER needs PA, plan only allows one tab daily. Will send to MD.  ? ? ?

## 2021-09-10 ENCOUNTER — Telehealth: Payer: Self-pay | Admitting: Psychiatry

## 2021-09-10 NOTE — Telephone Encounter (Signed)
Referral sent to [place] Location [phone]. ?

## 2021-09-16 ENCOUNTER — Telehealth: Payer: Self-pay | Admitting: Psychiatry

## 2021-09-16 NOTE — Telephone Encounter (Signed)
Pt called stating that the insurance will not cover the '100mg'$  but will cover the '50mg'$  of the topiramate ER (QUDEXY XR) 50 MG CS24 sprinkle capsule  ?Pharmacy informed her that if she can get the provider to write the Rx for '50mg'$  and do a full prescription then she can double up an triple up on the '50mg'$  to cover her for the '100mg'$  and the '150mg'$ . Please call pt to discuss. ?

## 2021-09-17 ENCOUNTER — Other Ambulatory Visit: Payer: Self-pay | Admitting: Psychiatry

## 2021-09-17 MED ORDER — TOPIRAMATE ER 50 MG PO SPRINKLE CAP24: EXTENDED_RELEASE_CAPSULE | ORAL | 3 refills | Status: DC

## 2021-09-17 NOTE — Telephone Encounter (Signed)
New rx sent, thanks

## 2021-09-24 ENCOUNTER — Telehealth: Payer: Self-pay | Admitting: Psychiatry

## 2021-09-24 NOTE — Telephone Encounter (Signed)
Called patient and LVM explaining the Rx is not for 90 days. Explained Dr Georgina Peer uptitration dosing and advised she discuss with pharmacist. Left #. ?

## 2021-09-24 NOTE — Telephone Encounter (Signed)
Pt states her insurance will only cover a 30 day on topiramate ER (QUDEXY XR) 50 MG CS24 sprinkle capsule not a 90 day ?

## 2021-10-01 ENCOUNTER — Other Ambulatory Visit: Payer: Self-pay | Admitting: Family Medicine

## 2021-10-02 MED ORDER — BUPROPION HCL 100 MG PO TABS: 100.0000 mg | ORAL_TABLET | Freq: Two times a day (BID) | ORAL | 0 refills | Status: DC

## 2021-10-02 NOTE — Telephone Encounter (Signed)
Medication refill error que, did not transmit. Will resend to pharmacy. ?

## 2021-10-02 NOTE — Addendum Note (Signed)
Addended by: Durwin Nora on: 10/02/2021 08:25 AM ? ? Modules accepted: Orders ? ?

## 2021-10-09 ENCOUNTER — Other Ambulatory Visit: Payer: Self-pay | Admitting: Family Medicine

## 2021-10-09 DIAGNOSIS — R609 Edema, unspecified: Secondary | ICD-10-CM

## 2021-10-09 NOTE — Telephone Encounter (Signed)
Requested medication (s) are due for refill today: yes ? ?Requested medication (s) are on the active medication list: yes ? ?Last refill:  07/11/21 #180/0 ? ?Future visit scheduled: yes 10/22/21 ? ?Notes to clinic:  Unable to refill per protocol due to failed labs, no updated results. ? ?  ?Requested Prescriptions  ?Pending Prescriptions Disp Refills  ? torsemide (DEMADEX) 20 MG tablet [Pharmacy Med Name: TORSEMIDE 20 MG TABLET] 180 tablet 0  ?  Sig: TAKE 2 TABLETS BY MOUTH EVERY DAY  ?  ? Cardiovascular:  Diuretics - Loop Failed - 10/09/2021  2:21 AM  ?  ?  Failed - K in normal range and within 180 days  ?  Potassium  ?Date Value Ref Range Status  ?09/29/2020 3.7 3.5 - 5.2 mmol/L Final  ?  ?  ?  ?  Failed - Ca in normal range and within 180 days  ?  Calcium  ?Date Value Ref Range Status  ?09/29/2020 9.3 8.7 - 10.3 mg/dL Final  ?  ?  ?  ?  Failed - Na in normal range and within 180 days  ?  Sodium  ?Date Value Ref Range Status  ?09/29/2020 144 134 - 144 mmol/L Final  ?  ?  ?  ?  Failed - Cr in normal range and within 180 days  ?  Creat  ?Date Value Ref Range Status  ?05/13/2017 0.92 0.50 - 1.05 mg/dL Final  ?  Comment:  ?  For patients >47 years of age, the reference limit ?for Creatinine is approximately 13% higher for people ?identified as African-American. ?. ?  ? ?Creatinine, Ser  ?Date Value Ref Range Status  ?09/29/2020 1.10 (H) 0.57 - 1.00 mg/dL Final  ?  ?  ?  ?  Failed - Cl in normal range and within 180 days  ?  Chloride  ?Date Value Ref Range Status  ?09/29/2020 103 96 - 106 mmol/L Final  ?  ?  ?  ?  Failed - Mg Level in normal range and within 180 days  ?  Magnesium  ?Date Value Ref Range Status  ?12/26/2019 2.2 1.7 - 2.4 mg/dL Final  ?  Comment:  ?  Performed at [facility], [address] [place], [place] [address]  ?  ?  ?  ?  Failed - Valid encounter within last 6 months  ?  Recent Outpatient Visits   ? ?      ? 9 months ago Neck muscle spasm  ? [facility] [place], Dionne Bucy, MD  ? 9 months ago Encounter for annual physical exam  ? [facility], Dionne Bucy, MD  ? 1 year ago Dizziness  ? [facility] Jerrol Banana., MD  ? 1 year ago Primary hypertension  ? [facility] [place], Dionne Bucy, MD  ? 1 year ago Left leg cellulitis  ? [facility], Dionne Bucy, MD  ? ?  ?  ?Future Appointments   ? ?        ? In 1 week Bacigalupo, Dionne Bucy, MD [facility]  ? ?  ? ? ?  ?  ?  Passed - Last BP in normal range  ?  BP Readings from Last 1 Encounters:  ?09/09/21 136/73  ?  ?  ?  ?  ? ?

## 2021-10-21 NOTE — Progress Notes (Signed)
?  ? ?I,Sulibeya S Dimas,acting as a scribe for Lavon Paganini, MD.,have documented all relevant documentation on the behalf of Lavon Paganini, MD,as directed by  Lavon Paganini, MD while in the presence of Lavon Paganini, MD. ? ? ?Established patient visit ? ? ?Patient: Rachael Brewer   DOB: 1960-11-26   61 y.o. Female  MRN: [id number] ?Visit Date: 10/22/2021 ? ?Today's healthcare provider: Lavon Paganini, MD  ? ?Chief Complaint  ?Patient presents with  ? Depression  ? ?Subjective  ?  ?HPI  ?Depression, Follow-up ? ?She  was last seen for this 10 months ago. ?Changes made at last visit include no changes. Encouraged therapy. ?  ?She reports excellent compliance with treatment. ?She is not having side effects.  ? ?She reports excellent tolerance of treatment. ?Current symptoms include: depressed mood, difficulty concentrating, fatigue, feelings of worthlessness/guilt, and hopelessness ? ?patient report her husband passed in November, 2022. ?She feels she is Worse since last visit.  It was unexpected. He had been sick, but it cascaded quickly. ? ? ?  10/22/2021  ?  2:24 PM 12/16/2020  ?  3:21 PM 07/03/2020  ? 10:14 AM  ?Depression screen PHQ 2/9  ?Decreased Interest '3 1 3  '$ ?Down, Depressed, Hopeless '2 1 1  '$ ?PHQ - 2 Score '5 2 4  '$ ?Altered sleeping 0 1 2  ?Tired, decreased energy '3 2 3  '$ ?Change in appetite '2 3 3  '$ ?Feeling bad or failure about yourself  '2 3 3  '$ ?Trouble concentrating 1 0 0  ?Moving slowly or fidgety/restless 0 0 0  ?Suicidal thoughts 1 0 0  ?PHQ-9 Score '14 11 15  '$ ?Difficult doing work/chores Extremely dIfficult Extremely dIfficult Very difficult  ?  ?----------------------------------------------------------------------------------------- ? ? ?Medications: ?Outpatient Medications Prior to Visit  ?Medication Sig  ? albuterol (VENTOLIN HFA) 108 (90 Base) MCG/ACT inhaler INHALE 1-2 PUFFS BY MOUTH EVERY 6 HOURS AS NEEDED FOR WHEEZE OR SHORTNESS OF BREATH  ? aspirin-acetaminophen-caffeine  (EXCEDRIN MIGRAINE) 250-250-65 MG tablet Take 2 tablets by mouth every 8 (eight) hours as needed for migraine.   ? Azelastine HCl 137 MCG/SPRAY SOLN Place 1 spray into both nostrils at bedtime.  ? fluticasone (FLONASE) 50 MCG/ACT nasal spray Place 2 sprays into both nostrils daily.  ? gabapentin (NEURONTIN) 600 MG tablet Take 1,200 mg by mouth at bedtime.   ? Multiple Vitamin (MULTIVITAMIN WITH MINERALS) TABS tablet Take 1 tablet by mouth daily.  ? omeprazole (PRILOSEC) 40 MG capsule TAKE 1 CAPSULE (40 MG TOTAL) BY MOUTH DAILY.  ? rizatriptan (MAXALT) 10 MG tablet Take 10 mg by mouth every 2 (two) hours as needed for migraine (max 2 tablets/24 hrs.).   ? topiramate ER (QUDEXY XR) 50 MG CS24 sprinkle capsule Take 1 pill daily for 5 days, then take 2 pills daily for 5 days, then take 3 pills daily  ? torsemide (DEMADEX) 20 MG tablet TAKE 2 TABLETS BY MOUTH EVERY DAY  ? [DISCONTINUED] baclofen (LIORESAL) 10 MG tablet Take 1 tablet (10 mg total) by mouth 2 (two) times daily as needed for muscle spasms.  ? [DISCONTINUED] buPROPion (WELLBUTRIN) 100 MG tablet Take 1 tablet (100 mg total) by mouth 2 (two) times daily. Please schedule office visit before any future refill.  ? [DISCONTINUED] escitalopram (LEXAPRO) 20 MG tablet TAKE 1 TABLET BY MOUTH EVERY DAY  ? [DISCONTINUED] Potassium Chloride 40 MEQ/15ML (20%) SOLN Take 40 mEq by mouth daily. (Patient not taking: Reported on 09/09/2021)  ? ?No facility-administered medications prior to visit.  ? ? ?  Review of Systems  ?Constitutional:  Positive for appetite change and fatigue.  ?Respiratory:  Negative for shortness of breath.   ?Cardiovascular:  Negative for chest pain and palpitations.  ?Psychiatric/Behavioral:  Positive for decreased concentration. The patient is nervous/anxious.   ? ?  ?  Objective  ?  ?BP 126/70 (BP Location: Left Arm, Patient Position: Sitting, Cuff Size: Large)   Pulse 62   Temp 98.2 ?F (36.8 ?C) (Oral)   Resp 16   Wt 253 lb (114.8 kg)   LMP  (LMP  Unknown)   SpO2 96%   BMI 44.82 kg/m?  ?BP Readings from Last 3 Encounters:  ?10/22/21 126/70  ?09/09/21 136/73  ?12/18/20 (!) 122/58  ? ?Wt Readings from Last 3 Encounters:  ?10/22/21 253 lb (114.8 kg)  ?09/09/21 257 lb (116.6 kg)  ?12/18/20 255 lb (115.7 kg)  ? ?  ? ?Physical Exam ?Vitals reviewed.  ?Constitutional:   ?   General: She is not in acute distress. ?   Appearance: Normal appearance. She is well-developed. She is not diaphoretic.  ?HENT:  ?   Head: Normocephalic and atraumatic.  ?Eyes:  ?   General: No scleral icterus. ?   Conjunctiva/sclera: Conjunctivae normal.  ?Neck:  ?   Thyroid: No thyromegaly.  ?Cardiovascular:  ?   Rate and Rhythm: Normal rate and regular rhythm.  ?   Pulses: Normal pulses.  ?   Heart sounds: Normal heart sounds. No murmur heard. ?Pulmonary:  ?   Effort: Pulmonary effort is normal. No respiratory distress.  ?   Breath sounds: Normal breath sounds. No wheezing, rhonchi or rales.  ?Musculoskeletal:  ?   Cervical back: Neck supple.  ?   Right lower leg: No edema.  ?   Left lower leg: No edema.  ?Lymphadenopathy:  ?   Cervical: No cervical adenopathy.  ?Skin: ?   General: Skin is warm and dry.  ?   Findings: No rash.  ?Neurological:  ?   Mental Status: She is alert and oriented to person, place, and time. Mental status is at baseline.  ?Psychiatric:     ?   Mood and Affect: Mood normal.     ?   Behavior: Behavior normal.  ?  ? ? ?No results found for any visits on 10/22/21. ? Assessment & Plan  ?  ? ?Problem List Items Addressed This Visit   ? ?  ? Cardiovascular and Mediastinum  ? BP (high blood pressure)  ?  Well controlled ?Continue current medications ?Recheck metabolic panel ?  ?  ? Relevant Orders  ? Comprehensive metabolic panel  ?  ? Other  ? Depression, recurrent ([place]) - Primary  ?  Chronic and uncontrolled ?Grieving loss of husband ?Difficulty remembering 2nd daily dose of Wellbutrin - will switch to XL dosing and increase to '300mg'$  ?Continue lexapro '20mg'$  daily ?Recommend  therapy ? ?  ?  ? Relevant Medications  ? escitalopram (LEXAPRO) 20 MG tablet  ? buPROPion (WELLBUTRIN XL) 300 MG 24 hr tablet  ? Hypercholesteremia  ?  Reviewed last lipid panel ?Not currently on a statin ?Recheck FLP and CMP ?Discussed diet and exercise  ?  ?  ? Relevant Orders  ? Lipid panel  ? Comprehensive metabolic panel  ? Morbid obesity ([place])  ?  Discussed importance of healthy weight management ?Discussed diet and exercise  ?  ?  ? Pre-diabetes  ?  Recommend low carb diet ?Recheck A1c  ?  ?  ? Relevant Orders  ?  Hemoglobin A1c  ?  ? ?Return in about 2 months (around 12/22/2021) for CPE.  ?   ? ?I, Lavon Paganini, MD, have reviewed all documentation for this visit. The documentation on 10/22/21 for the exam, diagnosis, procedures, and orders are all accurate and complete. ? ? ?Virginia Crews, MD, MPH ?[place] ?[facility]   ?

## 2021-10-22 ENCOUNTER — Ambulatory Visit (INDEPENDENT_AMBULATORY_CARE_PROVIDER_SITE_OTHER): Payer: 59 | Admitting: Family Medicine

## 2021-10-22 ENCOUNTER — Encounter: Payer: Self-pay | Admitting: Family Medicine

## 2021-10-22 VITALS — BP 126/70 | HR 62 | Temp 98.2°F | Resp 16 | Wt 253.0 lb

## 2021-10-22 DIAGNOSIS — R7303 Prediabetes: Secondary | ICD-10-CM | POA: Diagnosis not present

## 2021-10-22 DIAGNOSIS — F339 Major depressive disorder, recurrent, unspecified: Secondary | ICD-10-CM

## 2021-10-22 DIAGNOSIS — E7800 Pure hypercholesterolemia, unspecified: Secondary | ICD-10-CM | POA: Diagnosis not present

## 2021-10-22 DIAGNOSIS — I10 Essential (primary) hypertension: Secondary | ICD-10-CM

## 2021-10-22 MED ORDER — BACLOFEN 10 MG PO TABS: 10.0000 mg | ORAL_TABLET | Freq: Two times a day (BID) | ORAL | 5 refills | Status: DC | PRN

## 2021-10-22 MED ORDER — BUPROPION HCL ER (XL) 300 MG PO TB24: 300.0000 mg | ORAL_TABLET | Freq: Every day | ORAL | 1 refills | Status: DC

## 2021-10-22 MED ORDER — ESCITALOPRAM OXALATE 20 MG PO TABS: 20.0000 mg | ORAL_TABLET | Freq: Every day | ORAL | 3 refills | Status: DC

## 2021-10-22 NOTE — Assessment & Plan Note (Signed)
Discussed importance of healthy weight management Discussed diet and exercise  

## 2021-10-22 NOTE — Assessment & Plan Note (Signed)
Chronic and uncontrolled ?Grieving loss of husband ?Difficulty remembering 2nd daily dose of Wellbutrin - will switch to XL dosing and increase to '300mg'$  ?Continue lexapro '20mg'$  daily ?Recommend therapy ?

## 2021-10-22 NOTE — Assessment & Plan Note (Signed)
Well controlled Continue current medications Recheck metabolic panel 

## 2021-10-22 NOTE — Assessment & Plan Note (Signed)
Reviewed last lipid panel Not currently on a statin Recheck FLP and CMP Discussed diet and exercise  

## 2021-10-22 NOTE — Assessment & Plan Note (Signed)
Recommend low carb diet °Recheck A1c  °

## 2021-10-23 ENCOUNTER — Ambulatory Visit: Payer: Self-pay

## 2021-10-23 ENCOUNTER — Other Ambulatory Visit: Payer: Self-pay

## 2021-10-23 ENCOUNTER — Other Ambulatory Visit: Payer: Self-pay | Admitting: Physician Assistant

## 2021-10-23 ENCOUNTER — Other Ambulatory Visit: Payer: Self-pay | Admitting: Family Medicine

## 2021-10-23 ENCOUNTER — Emergency Department
Admission: EM | Admit: 2021-10-23 | Discharge: 2021-10-23 | Disposition: A | Discharge status: Home / Self Care | Payer: 59 | Attending: Emergency Medicine | Admitting: Emergency Medicine

## 2021-10-23 ENCOUNTER — Encounter: Payer: Self-pay | Admitting: Emergency Medicine

## 2021-10-23 ENCOUNTER — Emergency Department: Payer: 59

## 2021-10-23 DIAGNOSIS — E119 Type 2 diabetes mellitus without complications: Secondary | ICD-10-CM | POA: Diagnosis not present

## 2021-10-23 DIAGNOSIS — M5030 Other cervical disc degeneration, unspecified cervical region: Secondary | ICD-10-CM | POA: Insufficient documentation

## 2021-10-23 DIAGNOSIS — M542 Cervicalgia: Secondary | ICD-10-CM | POA: Diagnosis present

## 2021-10-23 DIAGNOSIS — R609 Edema, unspecified: Secondary | ICD-10-CM

## 2021-10-23 MED ORDER — PREDNISONE 10 MG PO TABS: ORAL_TABLET | ORAL | 0 refills | Status: DC

## 2021-10-23 MED ORDER — OXYCODONE HCL 5 MG PO TABS: 5.0000 mg | ORAL_TABLET | Freq: Four times a day (QID) | ORAL | 0 refills | Status: DC | PRN

## 2021-10-23 NOTE — ED Notes (Signed)
Dc ppw provided to patient. Questions and followup/rx info reviewed.pt declines vs at dc. Pt provides verbal consent for dc. Pt ambulatory off unit on foot alert and oriented. ?

## 2021-10-23 NOTE — ED Provider Notes (Signed)
? ?[facility] ?Provider Note ? ? ? Event Date/Time  ? First MD Initiated Contact with Patient 10/23/21 1045   ?  (approximate) ? ? ?History  ? ?Neck Pain ? ? ?HPI ? ?Rachael Brewer is a 61 y.o. female   to the ED with complaint of left-sided neck pain.  Patient states that she had seen her migraine doctor and mentioned that her neck hurt.  Her doctor then prescribed physical therapy which she has been doing.  She reports that she was doing well until yesterday when she began having pain to the left side of her neck and today has decreased range of motion due to increased pain.  She also has some minimal radiculopathy and was unable to lift an object today.  Patient has not had imaging done of her cervical spine.  Patient has a history of arthritis, diabetes, GERD, migraines and sleep apnea.  Currently she rates her pain as 9 out of 10. ? ?  ? ? ?Physical Exam  ? ?Triage Vital Signs: ?ED Triage Vitals  ?Enc Vitals Group  ?   BP 10/23/21 1013 137/69  ?   Pulse Rate 10/23/21 1013 68  ?   Resp 10/23/21 1013 16  ?   Temp 10/23/21 1017 98.6 ?F (37 ?C)  ?   Temp src --   ?   SpO2 10/23/21 1013 96 %  ?   Weight 10/23/21 1014 251 lb 5.2 oz (114 kg)  ?   Height 10/23/21 1041 '5\' 3"'$  (1.6 m)  ?   Head Circumference --   ?   Peak Flow --   ?   Pain Score 10/23/21 1013 9  ?   Pain Loc --   ?   Pain Edu? --   ?   Excl. in [place]? --   ? ? ?Most recent vital signs: ?Vitals:  ? 10/23/21 1013 10/23/21 1017  ?BP: 137/69   ?Pulse: 68   ?Resp: 16   ?Temp:  98.6 ?F (37 ?C)  ?SpO2: 96%   ? ? ? ?General: Awake, no distress.  ?CV:  Good peripheral perfusion.  Heart regular rate and rhythm. ?Resp:  Normal effort.  Lungs clear bilaterally. ?Abd:  No distention.  ?Other:  No point tenderness is noted on palpation of cervical spine posteriorly.  Range of motion laterally is restricted to the left side.  Patient is able to flex and extend without difficulty.  No localized tenderness on palpation of the cervical muscles,  trapezius and rhomboid muscles.  No evidence of injury.  Patient is able move upper extremities without any difficulty.  Good muscle strength bilaterally at 5/5. ? ? ?ED Results / Procedures / Treatments  ? ?Labs ?(all labs ordered are listed, but only abnormal results are displayed) ?Labs Reviewed - No data to display ? ? ? ?RADIOLOGY ?Cervical spine x-rays were reviewed independently by me and noted to have degenerative disc disease.  Radiology report also talks about moderate degenerative disc disease. ? ? ? ?PROCEDURES: ? ?Critical Care performed:  ? ?Procedures ? ? ?MEDICATIONS ORDERED IN ED: ?Medications - No data to display ? ? ?IMPRESSION / MDM / ASSESSMENT AND PLAN / ED COURSE  ?I reviewed the triage vital signs and the nursing notes. ? ? ?Differential diagnosis includes, but is not limited to, cervical pain, cervical radiculopathy, cervical strain.,  Torticollis. ? ?61 year old female presents to the ED with complaint of cervical spine, left side.  Patient has been having physical therapy and states  that after physical therapy yesterday she had increased pain and now has difficulty moving her neck laterally especially to the left.  Patient was shown her x-rays and also we discussed this at length.  A prescription for prednisone was sent to the pharmacy along with oxycodone 5 mg to take as needed for moderate to severe pain.  She is to follow-up with her PCP if any continued problems or concerns. ? ?  ? ? ?FINAL CLINICAL IMPRESSION(S) / ED DIAGNOSES  ? ?Final diagnoses:  ?Degenerative disc disease, cervical  ? ? ? ?Rx / DC Orders  ? ?ED Discharge Orders   ? ?      Ordered  ?  predniSONE (DELTASONE) 10 MG tablet       ? 10/23/21 1157  ?  oxyCODONE (ROXICODONE) 5 MG immediate release tablet  Every 6 hours PRN       ? 10/23/21 1157  ? ?  ?  ? ?  ? ? ? ?Note:  This document was prepared using Dragon voice recognition software and may include unintentional dictation errors. ?  ?Johnn Hai, PA-C ?10/23/21  1527 ? ?  ?Blake Divine, MD ?10/23/21 1625 ? ?

## 2021-10-23 NOTE — ED Notes (Signed)
See triage note  presents with pain to left side of neck and into left arm with some left arm/hand weakness  states she has had problems with her neck in past    has been going to PT per her PCP  pain increased over the past couple of days ?

## 2021-10-23 NOTE — Telephone Encounter (Signed)
Requested Prescriptions  ?Pending Prescriptions Disp Refills  ?? omeprazole (PRILOSEC) 40 MG capsule [Pharmacy Med Name: OMEPRAZOLE DR 40 MG CAPSULE] 90 capsule 1  ?  Sig: TAKE 1 CAPSULE (40 MG TOTAL) BY MOUTH DAILY.  ?  ? Gastroenterology: Proton Pump Inhibitors Passed - 10/23/2021  1:27 PM  ?  ?  Passed - Valid encounter within last 12 months  ?  Recent Outpatient Visits   ?      ? Yesterday Depression, recurrent ([place])  ? [facility] [place], Dionne Bucy, MD  ? 10 months ago Neck muscle spasm  ? [facility] Bacigal[place], Dionne Bucy, MD  ? 10 months ago Encounter for annual physical exam  ? [facility], Dionne Bucy, MD  ? 1 year ago Dizziness  ? [facility] Jerrol Banana., MD  ? 1 year ago Primary hypertension  ? [facility] Bacigalupo, Dionne Bucy, MD  ?  ?  ?Future Appointments   ?        ? In 2 months Bacigalupo, Dionne Bucy, MD [facility]  ?  ? ?  ?  ?  ? ? ?

## 2021-10-23 NOTE — Telephone Encounter (Signed)
Noted  

## 2021-10-23 NOTE — Discharge Instructions (Addendum)
Follow-up with your primary care provider if any continued problems or concerns.  At this time discontinue your physical therapy and you may restart this the middle of next week if your neck is better.  You may use moist heat or ice to your neck as needed for discomfort.  2 medications were sent to the pharmacy.  Oxycodone is every 6 hours as needed for moderate to severe pain.  Be aware that this medication could cause drowsiness and increase your risk for falling.  The prednisone is 3 tablets once a day for the next 7 days.  Also take your baclofen as directed by your doctor. ?

## 2021-10-23 NOTE — ED Triage Notes (Signed)
Pt to ED via POV c/o left sided neck pain. Pt states that it is hard to turn her head due to the pain. Pt states that she called her PCP and they seem to think it may be a pinched nerve. Pt states that she is currently in PT for her neck. Pt is in NAD.  ?

## 2021-10-23 NOTE — Telephone Encounter (Signed)
? ? ?  Chief Complaint: Neck pain - left side ?Symptoms: Neck pain, stiffness, pain, left arm numbness ?Frequency: Since yesterday afternoon ?Pertinent Negatives: Patient denies fever - inability to put chin to chest ?Disposition: '[x]'$ ED /'[]'$ Urgent Care (no appt availability in office) / '[]'$ Appointment(In office/virtual)/ '[]'$  [facility]/ '[]'$ Home Care/ '[]'$ Refused Recommended Disposition /'[]'$ [facility]/ '[]'$  Follow-up with PCP ?Additional Notes: Pt reports ongoing neck pain of 4-5/10, yesterday afternoon pain increased to 10/10. Pt states that pain starts in left shoulder (trapezius muscle) and radiates to neck. Pt is also report left arm weakness. ? ?Reason for Disposition ? Weakness of an arm or hand ? ?Answer Assessment - Initial Assessment Questions ?1. ONSET: "When did the pain begin?"  ?    Yesterday - before yesterday was 4-5/10 now 10/10 ?2. LOCATION: "Where does it hurt?"  ?    Left side ?3. PATTERN "Does the pain come and go, or has it been constant since it started?"  ?    constant ?4. SEVERITY: "How bad is the pain?"  (Scale 1-10; or mild, moderate, severe) ?  - NO PAIN (0): no pain or only slight stiffness  ?  - MILD (1-3): doesn't interfere with normal activities  ?  - MODERATE (4-7): interferes with normal activities or awakens from sleep  ?  - SEVERE (8-10):  excruciating pain, unable to do any normal activities  ?    10/10 ?5. RADIATION: "Does the pain go anywhere else, shoot into your arms?" ?    Starts in trapezius muscle and goes to head ?6. CORD SYMPTOMS: "Any weakness or numbness of the arms or legs?" ?    Left arm weakness ?7. CAUSE: "What do you think is causing the neck pain?" ?    no ?8. NECK OVERUSE: "Any recent activities that involved turning or twisting the neck?" ?    no ?9. OTHER SYMPTOMS: "Do you have any other symptoms?" (e.g., headache, fever, chest pain, difficulty breathing, neck swelling) ?    no ?10. PREGNANCY: "Is there any chance you are pregnant?" "When was  your last menstrual period?" ?      no ? ?Protocols used: Neck Pain or Stiffness-A-AH ? ?

## 2021-10-26 ENCOUNTER — Other Ambulatory Visit: Payer: Self-pay | Admitting: Family Medicine

## 2021-10-26 NOTE — Telephone Encounter (Signed)
Pt called in to follow up on her request for Oxycodone. Pt says that she is still in pain. Pt says that she will be out of her medication by the time she have her hospital follow up visit. Pt would like to know if provider could provide her with a refill until?  ? ? ?Pharmacy:  ?[id number] [place] - [address] Phone:  [phone] ?Fax:  [phone] ?  ? ? ? ?

## 2021-10-26 NOTE — Telephone Encounter (Signed)
Pt called and stated that she would like a refill for Oxycodone. She states that she is still in pain. She was in the ER on 10/23/20. Please advise  ? ? ?[id number] [place] - [address] Phone:  [phone] ?Fax:  [phone] ?  ? ?

## 2021-10-27 MED ORDER — OXYCODONE HCL 5 MG PO TABS: 5.0000 mg | ORAL_TABLET | Freq: Four times a day (QID) | ORAL | 0 refills | Status: AC | PRN

## 2021-10-27 NOTE — Telephone Encounter (Signed)
Requested medication (s) are due for refill today: yes ? ?Requested medication (s) are on the active medication list: yes ? ?Last refill:  10/23/21 #12 ? ?Future visit scheduled: yes ? ?Notes to clinic:  med not delegated to NT to RF      Last refill was written at hospital discharge ? ? ?Requested Prescriptions  ?Pending Prescriptions Disp Refills  ? oxyCODONE (ROXICODONE) 5 MG immediate release tablet 12 tablet 0  ?  Sig: Take 1 tablet (5 mg total) by mouth every 6 (six) hours as needed for moderate pain or severe pain.  ?  ? Not Delegated - Analgesics:  Opioid Agonists Failed - 10/26/2021  3:32 PM  ?  ?  Failed - This refill cannot be delegated  ?  ?  Failed - Urine Drug Screen completed in last 360 days  ?  ?  Passed - Valid encounter within last 3 months  ?  Recent Outpatient Visits   ? ?      ? 5 days ago Depression, recurrent ([place])  ? [facility] [place], Dionne Bucy, MD  ? 10 months ago Neck muscle spasm  ? [facility] Bacigalupo, Dionne Bucy, MD  ? 10 months ago Encounter for annual physical exam  ? [facility], Dionne Bucy, MD  ? 1 year ago Dizziness  ? [facility] Jerrol Banana., MD  ? 1 year ago Primary hypertension  ? [facility] Bacigalupo, Dionne Bucy, MD  ? ?  ?  ?Future Appointments   ? ?        ? In 2 days Bacigalupo, Dionne Bucy, MD [facility]  ? In 2 months Bacigalupo, Dionne Bucy, MD [facility]  ? ?  ? ? ?  ?  ?  ? ? ? ? ?

## 2021-10-28 NOTE — Progress Notes (Signed)
?  ? ?I,Rachael Brewer,acting as a scribe for Lavon Paganini, MD.,have documented all relevant documentation on the behalf of Lavon Paganini, MD,as directed by  Lavon Paganini, MD while in the presence of Lavon Paganini, MD. ? ? ?Established patient visit ? ? ?Patient: Rachael Brewer   DOB: 1961-02-24   61 y.o. Female  MRN: [id number] ?Visit Date: 10/29/2021 ? ?Today's healthcare provider: Lavon Paganini, MD  ? ?Chief Complaint  ?Patient presents with  ? Follow-up  ? ?Subjective  ?  ?HPI  ?Follow up ER visit ? ?Patient was seen in ER for neck pain on 10/23/21. ?She was treated for Degenerative disc disease, cervical. ?Treatment for this included prednisone and oxycodone. ?She reports excellent compliance with treatment. Patient reports taking oxycodone once daily.  ?Last dose of prednisone tomorrow. ?She reports this condition is Improved, but still somewhat present. ?Patient requesting referral to specialist for neck pain. ? ?No radicular symptoms. ? ?Already doing PT - started for migraines. ? ?reviewed XRay - mod DJD C5-7 ?----------------------------------------------------------------------------------------- ? ? ?Medications: ?Outpatient Medications Prior to Visit  ?Medication Sig  ? albuterol (VENTOLIN HFA) 108 (90 Base) MCG/ACT inhaler INHALE 1-2 PUFFS BY MOUTH EVERY 6 HOURS AS NEEDED FOR WHEEZE OR SHORTNESS OF BREATH  ? aspirin-acetaminophen-caffeine (EXCEDRIN MIGRAINE) 250-250-65 MG tablet Take 2 tablets by mouth every 8 (eight) hours as needed for migraine.   ? Azelastine HCl 137 MCG/SPRAY SOLN Place 1 spray into both nostrils at bedtime.  ? baclofen (LIORESAL) 10 MG tablet Take 1 tablet (10 mg total) by mouth 2 (two) times daily as needed for muscle spasms.  ? buPROPion (WELLBUTRIN XL) 300 MG 24 hr tablet Take 1 tablet (300 mg total) by mouth daily.  ? escitalopram (LEXAPRO) 20 MG tablet Take 1 tablet (20 mg total) by mouth daily.  ? fluticasone (FLONASE) 50 MCG/ACT nasal spray Place 2  sprays into both nostrils daily.  ? gabapentin (NEURONTIN) 600 MG tablet Take 1,200 mg by mouth at bedtime.   ? Multiple Vitamin (MULTIVITAMIN WITH MINERALS) TABS tablet Take 1 tablet by mouth daily.  ? omeprazole (PRILOSEC) 40 MG capsule TAKE 1 CAPSULE (40 MG TOTAL) BY MOUTH DAILY.  ? oxyCODONE (ROXICODONE) 5 MG immediate release tablet Take 1 tablet (5 mg total) by mouth every 6 (six) hours as needed for up to 5 days for moderate pain or severe pain.  ? predniSONE (DELTASONE) 10 MG tablet Take 3 tablets once a day for 7 days  ? rizatriptan (MAXALT) 10 MG tablet Take 10 mg by mouth every 2 (two) hours as needed for migraine (max 2 tablets/24 hrs.).   ? topiramate ER (QUDEXY XR) 50 MG CS24 sprinkle capsule Take 1 pill daily for 5 days, then take 2 pills daily for 5 days, then take 3 pills daily  ? torsemide (DEMADEX) 20 MG tablet TAKE 2 TABLETS BY MOUTH EVERY DAY  ? ?No facility-administered medications prior to visit.  ? ? ?Review of Systems  ?Constitutional:  Negative for appetite change, chills and fever.  ?Respiratory:  Negative for shortness of breath.   ?Cardiovascular:  Negative for chest pain and palpitations.  ?Musculoskeletal:  Positive for neck pain and neck stiffness.  ? ?Last CBC ?Lab Results  ?Component Value Date  ? WBC 6.8 09/29/2020  ? HGB 15.9 09/29/2020  ? HCT 47.0 (H) 09/29/2020  ? MCV 92 09/29/2020  ? MCH 31.2 09/29/2020  ? RDW 12.8 09/29/2020  ? PLT 269 09/29/2020  ? ?Last metabolic panel ?Lab Results  ?Component Value Date  ?  GLUCOSE 126 (H) 09/29/2020  ? NA 144 09/29/2020  ? K 3.7 09/29/2020  ? CL 103 09/29/2020  ? CO2 20 09/29/2020  ? BUN 16 09/29/2020  ? CREATININE 1.10 (H) 09/29/2020  ? EGFR 57 (L) 09/29/2020  ? CALCIUM 9.3 09/29/2020  ? PHOS 3.7 12/26/2019  ? PROT 6.9 09/29/2020  ? ALBUMIN 4.3 09/29/2020  ? LABGLOB 2.6 09/29/2020  ? AGRATIO 1.7 09/29/2020  ? BILITOT 0.3 09/29/2020  ? ALKPHOS 77 09/29/2020  ? AST 28 09/29/2020  ? ALT 23 09/29/2020  ? ANIONGAP 8 12/26/2019  ? ?Last  lipids ?Lab Results  ?Component Value Date  ? CHOL 137 12/19/2019  ? HDL 40 12/19/2019  ? [place] 73 12/19/2019  ? TRIG 138 12/19/2019  ? CHOLHDL 3.4 12/19/2019  ? ?  ?  Objective  ?  ?BP 119/73 (BP Location: Left Arm, Patient Position: Sitting, Cuff Size: Large)   Pulse 64   Temp 98.6 ?F (37 ?C) (Oral)   Resp 16   Wt 252 lb 4.8 oz (114.4 kg)   LMP  (LMP Unknown)   BMI 44.69 kg/m?  ?BP Readings from Last 3 Encounters:  ?10/29/21 119/73  ?10/23/21 137/69  ?10/22/21 126/70  ? ?Wt Readings from Last 3 Encounters:  ?10/29/21 252 lb 4.8 oz (114.4 kg)  ?10/23/21 251 lb 5.2 oz (114 kg)  ?10/22/21 253 lb (114.8 kg)  ? ?  ? ?Physical Exam ?Constitutional:   ?   General: She is not in acute distress. ?   Appearance: Normal appearance.  ?HENT:  ?   Head: Normocephalic.  ?Pulmonary:  ?   Effort: Pulmonary effort is normal. No respiratory distress.  ?Musculoskeletal:  ?   Comments: Neck: ?Negative spurling's ?Tightness of L trapezius ?Grip strength and sensation normal in bilateral hands ?Strength good C4 to T1 distribution ?No sensory change to C4 to T1  ?Neurological:  ?   Mental Status: She is alert and oriented to person, place, and time. Mental status is at baseline.  ?  ? ? ?No results found for any visits on 10/29/21. ? Assessment & Plan  ?  ? ?Problem List Items Addressed This Visit   ? ?  ? Musculoskeletal and Integument  ? Spondylosis of cervical region without myelopathy or radiculopathy - Primary  ? Relevant Orders  ? Ambulatory referral to Orthopedic Surgery  ? - new problem ?- already doing PT - will continue ?- referral to Ortho for further eval/management ?- no radicular symptoms, so no MRI at this time ?- continue meds from ED ? ?Return if symptoms worsen or fail to improve.  ?   ? ?I, Lavon Paganini, MD, have reviewed all documentation for this visit. The documentation on 10/29/21 for the exam, diagnosis, procedures, and orders are all accurate and complete. ? ? ?Virginia Crews, MD,  MPH ?[place] ?[facility]   ?

## 2021-10-29 ENCOUNTER — Ambulatory Visit (INDEPENDENT_AMBULATORY_CARE_PROVIDER_SITE_OTHER): Payer: 59 | Admitting: Family Medicine

## 2021-10-29 ENCOUNTER — Encounter: Payer: Self-pay | Admitting: Family Medicine

## 2021-10-29 VITALS — BP 119/73 | HR 64 | Temp 98.6°F | Resp 16 | Wt 252.3 lb

## 2021-10-29 DIAGNOSIS — M47812 Spondylosis without myelopathy or radiculopathy, cervical region: Secondary | ICD-10-CM | POA: Diagnosis not present

## 2021-11-05 ENCOUNTER — Telehealth: Payer: Self-pay

## 2021-11-05 NOTE — Telephone Encounter (Signed)
Copied from [place] [id number]. Topic: General - Other >> Nov 05, 2021  1:03 PM Bayard Beaver wrote: Reason for CRM:Pt called in states  referral for Orthopaedic should be for  [facility], but received call from [facility]. Please call back to confirm if pt can be seen at [facility] instead of [facility].

## 2021-11-05 NOTE — Telephone Encounter (Signed)
Message sent to referral coordinator to change referral to [facility].

## 2021-11-05 NOTE — Telephone Encounter (Signed)
Patient advised that [facility] does not accept her insurance.

## 2021-11-10 ENCOUNTER — Telehealth: Payer: Self-pay | Admitting: Family Medicine

## 2021-11-10 LAB — COMPREHENSIVE METABOLIC PANEL
ALT: 17 IU/L (ref 0–32)
AST: 23 IU/L (ref 0–40)
Albumin/Globulin Ratio: 1.8 (ref 1.2–2.2)
Albumin: 4 g/dL (ref 3.8–4.8)
Alkaline Phosphatase: 70 IU/L (ref 44–121)
BUN/Creatinine Ratio: 24 (ref 12–28)
BUN: 20 mg/dL (ref 8–27)
Bilirubin Total: 0.4 mg/dL (ref 0.0–1.2)
CO2: 27 mmol/L (ref 20–29)
Calcium: 9.2 mg/dL (ref 8.7–10.3)
Chloride: 104 mmol/L (ref 96–106)
Creatinine, Ser: 0.84 mg/dL (ref 0.57–1.00)
Globulin, Total: 2.2 g/dL (ref 1.5–4.5)
Glucose: 115 mg/dL — ABNORMAL HIGH (ref 70–99)
Potassium: 4.7 mmol/L (ref 3.5–5.2)
Sodium: 144 mmol/L (ref 134–144)
Total Protein: 6.2 g/dL (ref 6.0–8.5)
eGFR: 79 mL/min/{1.73_m2} (ref >59)

## 2021-11-10 LAB — LIPID PANEL
Chol/HDL Ratio: 3 ratio (ref 0.0–4.4)
Cholesterol, Total: 130 mg/dL (ref 100–199)
HDL: 43 mg/dL (ref >39)
LDL Chol Calc (NIH): 70 mg/dL (ref 0–99)
Triglycerides: 91 mg/dL (ref 0–149)
VLDL Cholesterol Cal: 17 mg/dL (ref 5–40)

## 2021-11-10 LAB — HEMOGLOBIN A1C
Est. average glucose Bld gHb Est-mCnc: 131 mg/dL
Hgb A1c MFr Bld: 6.2 % — ABNORMAL HIGH (ref 4.8–5.6)

## 2021-11-10 NOTE — Telephone Encounter (Signed)
Patient advised that [facility] does not take her insurance. Patient advised referral was sent to [facility].

## 2021-11-10 NOTE — Telephone Encounter (Signed)
Referral Request - Has patient seen PCP for this complaint? Yes.   *If NO, is insurance requiring patient see PCP for this issue before PCP can refer them? Referral for which specialty: orthopedics Preferred provider/office: [facility] Dr Harlow Mares Reason for referral: neck pain Pt states it was her understanding Dr B was going to put referral in for [facility], but she has not gotten any calls.

## 2021-11-11 ENCOUNTER — Other Ambulatory Visit: Payer: Self-pay | Admitting: Podiatry

## 2021-11-11 ENCOUNTER — Ambulatory Visit (INDEPENDENT_AMBULATORY_CARE_PROVIDER_SITE_OTHER): Payer: 59

## 2021-11-11 ENCOUNTER — Encounter: Payer: Self-pay | Admitting: Podiatry

## 2021-11-11 ENCOUNTER — Ambulatory Visit: Payer: 59 | Admitting: Podiatry

## 2021-11-11 DIAGNOSIS — M778 Other enthesopathies, not elsewhere classified: Secondary | ICD-10-CM

## 2021-11-11 DIAGNOSIS — M7661 Achilles tendinitis, right leg: Secondary | ICD-10-CM | POA: Diagnosis not present

## 2021-11-11 DIAGNOSIS — M7751 Other enthesopathy of right foot: Secondary | ICD-10-CM | POA: Diagnosis not present

## 2021-11-11 MED ORDER — TRIAMCINOLONE ACETONIDE 40 MG/ML IJ SUSP
20.0000 mg | Freq: Once | INTRAMUSCULAR | Status: AC
  Administered 2021-11-11: 20 mg

## 2021-11-11 MED ORDER — DEXAMETHASONE SODIUM PHOSPHATE 120 MG/30ML IJ SOLN
2.0000 mg | Freq: Once | INTRAMUSCULAR | Status: AC
  Administered 2021-11-11: 2 mg via INTRA_ARTICULAR

## 2021-11-11 NOTE — Progress Notes (Signed)
She presents today for a chief complaint of pain to the posterior heel and the anterior lateral ankle.  She has been aching for the past few months and swelling some she denies any injury or overuse.  Objective: Vital signs are stable she is alert and oriented x3.  Pulses are palpable.  She has great range of motion of the ankle joint tenderness on medial lateral subtalar joint motion particular overlying the superior sinus tarsi area.  She also has tenderness on palpation over the Achilles tendon at its insertion site.  Mild tenderness on palpation of the plantar fascia.  Radiographs taken today do not demonstrate any significant acute findings.  Assessment: Sinus tarsitis subtalar joint capsulitis right foot.  Achilles tendinitis retrocalcaneal bursitis right foot.  Cannot rule out planter fasciitis.  Plan: At this point we injected subtalar joint with Kenalog and local anesthetic injected the bursa of the retrocalcaneal area today with 2 mg of dexamethasone making sure not to inject into the tendon itself.  I would like to follow-up with her in 6 weeks we did discuss new shoes.  May need to consider an injection to the plantar fascia.

## 2021-11-25 ENCOUNTER — Ambulatory Visit: Payer: 59 | Admitting: Podiatry

## 2021-12-09 DIAGNOSIS — R21 Rash and other nonspecific skin eruption: Secondary | ICD-10-CM | POA: Insufficient documentation

## 2021-12-14 ENCOUNTER — Ambulatory Visit: Payer: Self-pay | Admitting: *Deleted

## 2021-12-15 ENCOUNTER — Ambulatory Visit: Payer: 59 | Admitting: Physician Assistant

## 2021-12-15 ENCOUNTER — Ambulatory Visit (INDEPENDENT_AMBULATORY_CARE_PROVIDER_SITE_OTHER): Payer: 59 | Admitting: Family Medicine

## 2021-12-15 ENCOUNTER — Encounter: Payer: Self-pay | Admitting: Family Medicine

## 2021-12-15 VITALS — BP 137/80 | HR 67 | Temp 99.1°F | Resp 16 | Ht 63.0 in | Wt 250.4 lb

## 2021-12-15 DIAGNOSIS — L239 Allergic contact dermatitis, unspecified cause: Secondary | ICD-10-CM | POA: Diagnosis not present

## 2021-12-15 MED ORDER — TRIAMCINOLONE ACETONIDE 0.5 % EX OINT: 1.0000 | TOPICAL_OINTMENT | Freq: Two times a day (BID) | CUTANEOUS | 0 refills | Status: DC

## 2021-12-15 MED ORDER — PREDNISONE 10 MG (21) PO TBPK: ORAL_TABLET | ORAL | 0 refills | Status: DC

## 2021-12-15 MED ORDER — CLOBETASOL PROPIONATE 0.05 % EX CREA: 1.0000 | TOPICAL_CREAM | Freq: Two times a day (BID) | CUTANEOUS | 0 refills | Status: DC

## 2021-12-23 ENCOUNTER — Ambulatory Visit: Payer: 59 | Admitting: Podiatry

## 2022-01-05 ENCOUNTER — Other Ambulatory Visit: Payer: Self-pay | Admitting: Family Medicine

## 2022-01-05 ENCOUNTER — Other Ambulatory Visit: Payer: Self-pay | Admitting: Physician Assistant

## 2022-01-05 DIAGNOSIS — R609 Edema, unspecified: Secondary | ICD-10-CM

## 2022-01-07 ENCOUNTER — Encounter: Payer: 59 | Admitting: Family Medicine

## 2022-01-11 ENCOUNTER — Encounter: Payer: Self-pay | Admitting: Family Medicine

## 2022-01-11 ENCOUNTER — Ambulatory Visit (INDEPENDENT_AMBULATORY_CARE_PROVIDER_SITE_OTHER): Payer: 59 | Admitting: Family Medicine

## 2022-01-11 VITALS — BP 118/63 | HR 62 | Temp 98.0°F | Resp 16 | Ht 63.0 in | Wt 246.7 lb

## 2022-01-11 DIAGNOSIS — Z0000 Encounter for general adult medical examination without abnormal findings: Secondary | ICD-10-CM

## 2022-01-11 DIAGNOSIS — R601 Generalized edema: Secondary | ICD-10-CM

## 2022-01-11 DIAGNOSIS — R87610 Atypical squamous cells of undetermined significance on cytologic smear of cervix (ASC-US): Secondary | ICD-10-CM

## 2022-01-11 DIAGNOSIS — F32A Depression, unspecified: Secondary | ICD-10-CM | POA: Diagnosis not present

## 2022-01-11 DIAGNOSIS — Z1231 Encounter for screening mammogram for malignant neoplasm of breast: Secondary | ICD-10-CM

## 2022-01-11 DIAGNOSIS — Z1211 Encounter for screening for malignant neoplasm of colon: Secondary | ICD-10-CM | POA: Diagnosis not present

## 2022-01-11 DIAGNOSIS — R5383 Other fatigue: Secondary | ICD-10-CM

## 2022-01-11 MED ORDER — BUPROPION HCL ER (XL) 150 MG PO TB24: 150.0000 mg | ORAL_TABLET | Freq: Every day | ORAL | 0 refills | Status: DC

## 2022-01-11 MED ORDER — TORSEMIDE 20 MG PO TABS: 40.0000 mg | ORAL_TABLET | Freq: Every day | ORAL | 0 refills | Status: DC

## 2022-01-11 MED ORDER — FLUTICASONE PROPIONATE 50 MCG/ACT NA SUSP
2.0000 | Freq: Every day | NASAL | 11 refills | Status: AC
Start: 2022-01-11 — End: ?

## 2022-01-11 NOTE — Assessment & Plan Note (Signed)
Due for screening for mammogram, denies breast concerns, provided with phone number to call and schedule appointment for mammogram. Encouraged to repeat breast cancer screening every 1-2 years.  Please call and schedule your mammogram:  [facility]  [address], [address] [facility] [place],  [place]  [address] Get Driving Directions Main: [phone]  Sunday:Closed Monday:7:20 AM - 5:00 PM Tuesday:7:20 AM - 5:00 PM Wednesday:7:20 AM - 5:00 PM Thursday:7:20 AM - 5:00 PM Friday:7:20 AM - 4:30 PM Saturday:Closed  

## 2022-01-11 NOTE — Assessment & Plan Note (Signed)
Reports occasional looseness; normal exam Due for colon cancer screening Referral placed to GI for traditional colonoscopy

## 2022-01-11 NOTE — Assessment & Plan Note (Signed)
Chronic, stable Report of use of daily diuretic to assist; encourage follow up with vascular/cardiology, patient refused at this time as this is chronic and she has been dealing with LE edema since she was 17
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
## 2022-01-11 NOTE — Assessment & Plan Note (Signed)
Referral placed to GYN to assist with necessary care

## 2022-01-11 NOTE — Assessment & Plan Note (Signed)
Due for dental Due for vision Things to do to keep yourself healthy  - Exercise at least 30-45 minutes a day, 3-4 days a week.  - Eat a low-fat diet with lots of fruits and vegetables, up to 7-9 servings per day.  - Seatbelts can save your life. Wear them always.  - Smoke detectors on every level of your home, check batteries every year.  - Eye Doctor - have an eye exam every 1-2 years  - Safe sex - if you may be exposed to STDs, use a condom.  - Alcohol -  If you drink, do it moderately, less than 2 drinks per day.  - [place]. Choose someone to speak for you if you are not able.  - Depression is common in our stressful world.If you're feeling down or losing interest in things you normally enjoy, please come in for a visit.  - Violence - If anyone is threatening or hurting you, please call immediately.

## 2022-01-12 ENCOUNTER — Other Ambulatory Visit: Payer: Self-pay

## 2022-01-12 ENCOUNTER — Telehealth: Payer: Self-pay | Admitting: *Deleted

## 2022-01-12 ENCOUNTER — Telehealth: Payer: Self-pay

## 2022-01-12 DIAGNOSIS — R87610 Atypical squamous cells of undetermined significance on cytologic smear of cervix (ASC-US): Secondary | ICD-10-CM | POA: Insufficient documentation

## 2022-01-12 DIAGNOSIS — Z1211 Encounter for screening for malignant neoplasm of colon: Secondary | ICD-10-CM

## 2022-01-12 LAB — CBC WITH DIFFERENTIAL/PLATELET
Basophils Absolute: 0.1 10*3/uL (ref 0.0–0.2)
Basos: 1 %
EOS (ABSOLUTE): 0.3 10*3/uL (ref 0.0–0.4)
Eos: 5 %
Hematocrit: 45.1 % (ref 34.0–46.6)
Hemoglobin: 15.3 g/dL (ref 11.1–15.9)
Immature Grans (Abs): 0 10*3/uL (ref 0.0–0.1)
Immature Granulocytes: 0 %
Lymphocytes Absolute: 2 10*3/uL (ref 0.7–3.1)
Lymphs: 32 %
MCH: 31 pg (ref 26.6–33.0)
MCHC: 33.9 g/dL (ref 31.5–35.7)
MCV: 91 fL (ref 79–97)
Monocytes Absolute: 0.6 10*3/uL (ref 0.1–0.9)
Monocytes: 10 %
Neutrophils Absolute: 3.3 10*3/uL (ref 1.4–7.0)
Neutrophils: 52 %
Platelets: 293 10*3/uL (ref 150–450)
RBC: 4.94 x10E6/uL (ref 3.77–5.28)
RDW: 12.7 % (ref 11.7–15.4)
WBC: 6.3 10*3/uL (ref 3.4–10.8)

## 2022-01-12 LAB — IRON,TIBC AND FERRITIN PANEL
Ferritin: 144 ng/mL (ref 15–150)
Iron Saturation: 29 % (ref 15–55)
Iron: 90 ug/dL (ref 27–139)
Total Iron Binding Capacity: 312 ug/dL (ref 250–450)
UIBC: 222 ug/dL (ref 118–369)

## 2022-01-12 LAB — TSH+FREE T4
Free T4: 1.36 ng/dL (ref 0.82–1.77)
TSH: 1.66 u[IU]/mL (ref 0.450–4.500)

## 2022-01-12 LAB — B12 AND FOLATE PANEL
Folate: 13.8 ng/mL (ref >3.0)
Vitamin B-12: 536 pg/mL (ref 232–1245)

## 2022-01-12 LAB — VITAMIN D 25 HYDROXY (VIT D DEFICIENCY, FRACTURES): Vit D, 25-Hydroxy: 31.4 ng/mL (ref 30.0–100.0)

## 2022-01-12 MED ORDER — NA SULFATE-K SULFATE-MG SULF 17.5-3.13-1.6 GM/177ML PO SOLN: 354.0000 mL | Freq: Once | ORAL | 0 refills | Status: AC

## 2022-01-12 NOTE — Telephone Encounter (Signed)
Gastroenterology Pre-Procedure Review  Request Date: 02/25/2022 Requesting Physician: Dr. Allen Norris   PATIENT REVIEW QUESTIONS: The patient responded to the following health history questions as indicated:    1. Are you having any GI issues? no 2. Do you have a personal history of Polyps? no 3. Do you have a family history of Colon Cancer or Polyps? no 4. Diabetes Mellitus? no 5. Joint replacements in the past 12 months? No 6. Major health problems in the past 3 months?no 7. Any artificial heart valves, MVP, or defibrillator?no    MEDICATIONS & ALLERGIES:    Patient reports the following regarding taking any anticoagulation/antiplatelet therapy:   Plavix, Coumadin, Eliquis, Xarelto, Lovenox, Pradaxa, Brilinta, or Effient? no Aspirin? no  Patient confirms/reports the following medications:  Current Outpatient Medications  Medication Sig Dispense Refill   albuterol (VENTOLIN HFA) 108 (90 Base) MCG/ACT inhaler INHALE 1-2 PUFFS BY MOUTH EVERY 6 HOURS AS NEEDED FOR WHEEZE OR SHORTNESS OF BREATH 6.7 each 3   aspirin-acetaminophen-caffeine (EXCEDRIN MIGRAINE) 250-250-65 MG tablet Take 2 tablets by mouth every 8 (eight) hours as needed for migraine.      Azelastine HCl 137 MCG/SPRAY SOLN Place 1 spray into both nostrils at bedtime.     baclofen (LIORESAL) 10 MG tablet Take 1 tablet (10 mg total) by mouth 2 (two) times daily as needed for muscle spasms. 30 tablet 5   buPROPion (WELLBUTRIN XL) 150 MG 24 hr tablet Take 1 tablet (150 mg total) by mouth daily. 90 tablet 0   buPROPion (WELLBUTRIN XL) 300 MG 24 hr tablet Take 1 tablet (300 mg total) by mouth daily. 90 tablet 1   clobetasol cream (TEMOVATE) 9.51 % Apply 1 Application topically 2 (two) times daily. 30 g 0   escitalopram (LEXAPRO) 20 MG tablet Take 1 tablet (20 mg total) by mouth daily. 90 tablet 3   fluticasone (FLONASE) 50 MCG/ACT nasal spray Place 2 sprays into both nostrils daily. 16 g 11   gabapentin (NEURONTIN) 600 MG tablet Take  1,200 mg by mouth at bedtime.      Multiple Vitamin (MULTIVITAMIN WITH MINERALS) TABS tablet Take 1 tablet by mouth daily.     omeprazole (PRILOSEC) 40 MG capsule TAKE 1 CAPSULE (40 MG TOTAL) BY MOUTH DAILY. 90 capsule 1   rizatriptan (MAXALT) 10 MG tablet Take 10 mg by mouth every 2 (two) hours as needed for migraine (max 2 tablets/24 hrs.).   0   topiramate ER (QUDEXY XR) 50 MG CS24 sprinkle capsule Take 1 pill daily for 5 days, then take 2 pills daily for 5 days, then take 3 pills daily 90 capsule 3   torsemide (DEMADEX) 20 MG tablet Take 2 tablets (40 mg total) by mouth daily. 180 tablet 0   triamcinolone ointment (KENALOG) 0.5 % Apply 1 Application topically 2 (two) times daily. 30 g 0   No current facility-administered medications for this visit.    Patient confirms/reports the following allergies:  Allergies  Allergen Reactions   Bactrim [Sulfamethoxazole-Trimethoprim] Shortness Of Breath and Swelling   Sulfa Antibiotics Anaphylaxis   Penicillins Hives    Did it involve swelling of the face/tongue/throat, SOB, or low BP? No Did it involve sudden or severe rash/hives, skin peeling, or any reaction on the inside of your mouth or nose? No Did you need to seek medical attention at a hospital or doctor's office? No When did it last happen?     10 years ago  If all above answers are "NO", may proceed with cephalosporin use.  Did it involve swelling of the face/tongue/throat, SOB, or low BP? No Did it involve sudden or severe rash/hives, skin peeling, or any reaction on the inside of your mouth or nose? No Did you need to seek medical attention at a hospital or doctor's office? No When did it last happen?     10 years ago  If all above answers are "NO", may proceed with cephalosporin use.   Sumatriptan     Made migraine more intense and scalp felt like it was on fire    No orders of the defined types were placed in this encounter.   AUTHORIZATION INFORMATION Primary  Insurance: 1D#: Group #:  Secondary Insurance: 1D#: Group #:  SCHEDULE INFORMATION: Date:  Time: Location:

## 2022-01-12 NOTE — Chronic Care Management (AMB) (Signed)
  Care Coordination  Note  01/12/2022 Name: JESS SULAK MRN: [id number] DOB: 03/02/1961  ALMAROSA BOHAC is a 61 y.o. year old female who is a primary care patient of Brita Romp, Dionne Bucy, MD. I reached out to Agnes Lawrence by phone today to offer care coordination services.      [name] was given information about Care Coordination services today including:  The Care Coordination services include support from the care team which includes your Nurse Coordinator, Clinical Social Worker, or Pharmacist.  The Care Coordination team is here to help remove barriers to the health concerns and goals most important to you. Care Coordination services are voluntary and the patient may decline or stop services at any time by request to their care team member.   Patient agreed to services and verbal consent obtained.   Follow up plan: Telephone appointment with care coordination team member scheduled for: 01/13/2022  Julian Hy, [place] Direct Dial: [phone]

## 2022-01-13 ENCOUNTER — Ambulatory Visit: Payer: Self-pay | Admitting: *Deleted

## 2022-01-13 NOTE — Patient Outreach (Signed)
  Care Coordination   Initial Visit Note   01/13/2022 Name: SAMAI COREA MRN: [id number] DOB: Sep 19, 1960  JACELYN CUEN is a 61 y.o. year old female who sees Bacigalupo, Dionne Bucy, MD for primary care. I spoke with  Agnes Lawrence by phone today  What matters to the patients health and wellness today?  Counseling Resources   Goals Addressed               This Visit's Progress     I need counseling for myself and my son (pt-stated)        Care Coordination Interventions: Confirmed recent loss of her spouse, normalizing grief response Grief counseling for patient and son recommended, resources for Grief Counseling through Hospice and Palliative Care provided [phone]-patient agreeable to calling to set up initial appointment today Patient discussed probability that her insurance coverage will change, plan to wait for referral for ongoing counseling until insurance coverage can be confirmed Motivational Interviewing employed PHQ2/PHQ9 completed/reviewed Solution-Focused Strategies employed Active listening / Reflection utilized  Engineer, petroleum Provided in regards to loss of her spouse, verbalization of feelings encouraged, grief response normalized         SDOH assessments and interventions completed:   Yes SDOH Interventions Today    Flowsheet Row Most Recent Value  SDOH Interventions   Depression Interventions/Treatment  Counseling       Care Coordination Interventions Activated:  Yes Care Coordination Interventions:  Yes, provided  Follow up plan: Follow up call scheduled for 01/28/22  Encounter Outcome:  Pt. Visit Completed

## 2022-01-13 NOTE — Patient Instructions (Signed)
Visit Information  Thank you for taking time to visit with me today. Please don't hesitate to contact me if I can be of assistance to you.   Following are the goals we discussed today:   Goals Addressed               This Visit's Progress     I need counseling for myself and my son (pt-stated)        Care Coordination Interventions: Confirmed recent loss of her spouse, normalizing grief response Grief counseling for patient and son recommended, resources for Grief Counseling through Hospice and Palliative Care provided [phone]-patient agreeable to calling to set up initial appointment today Patient discussed probability that her insurance coverage will change, plan to wait for referral for ongoing counseling until insurance coverage can be confirmed Motivational Interviewing employed PHQ2/PHQ9 completed/reviewed Solution-Focused Strategies employed Active listening / Reflection utilized  Emotional Support Provided in regards to loss of her spouse, verbalization of feelings encouraged, grief response normalized         Our next appointment is by telephone on 01/28/22 at 10am  Please call the care guide team at [phone] if you need to cancel or reschedule your appointment.   If you are experiencing a Mental Health or [place] or need someone to talk to, please call the Suicide and Crisis Lifeline: 988   Patient verbalizes understanding of instructions and care plan provided today and agrees to view in [place]. Active MyChart status and patient understanding of how to access instructions and care plan via MyChart confirmed with patient.       Sheralyn Boatman [facility] Care Management [phone]

## 2022-01-14 ENCOUNTER — Other Ambulatory Visit: Payer: Self-pay | Admitting: Family Medicine

## 2022-01-20 ENCOUNTER — Ambulatory Visit: Payer: 59 | Admitting: Psychiatry

## 2022-01-20 VITALS — BP 147/76 | HR 62 | Ht 63.0 in | Wt 251.2 lb

## 2022-01-20 DIAGNOSIS — G43019 Migraine without aura, intractable, without status migrainosus: Secondary | ICD-10-CM

## 2022-01-20 MED ORDER — BACLOFEN 10 MG PO TABS: 10.0000 mg | ORAL_TABLET | Freq: Two times a day (BID) | ORAL | 5 refills | Status: DC | PRN

## 2022-01-20 MED ORDER — GABAPENTIN 600 MG PO TABS: 600.0000 mg | ORAL_TABLET | Freq: Two times a day (BID) | ORAL | 6 refills | Status: DC

## 2022-01-20 MED ORDER — TOPIRAMATE ER 100 MG PO SPRINKLE CAP24: 100.0000 mg | EXTENDED_RELEASE_CAPSULE | Freq: Every day | ORAL | 6 refills | Status: DC

## 2022-01-20 NOTE — Patient Instructions (Addendum)
Increase topiramate to 100 mg daily, can take two 50 mg tablets

## 2022-01-20 NOTE — Progress Notes (Signed)
   CC:  headaches  Follow-up Visit  Last visit: 09/09/21  Brief HPI: 61 year old female with a history of anxiety, depression, OSA who follows in clinic for migraines. MRI brain 10/27/20 was unremarkable.  At her last visit she was continued on Qudexy and gabapentin for headache prevention. Referral to neck PT was placed.  Interval History: She currently has headaches every day. These resolve with OTCs, which she is taking daily. She will use Maxalt at night which helps but is not as fast acting. She is taking Qudexy 50 mg daily without side effects. Has not tried increasing her dose.  She has noticed that when she does not use her CPAP machine she will wake up with headaches. She was also diagnosed with degenerative disc disease at C5-6 and C6-7 and has been prescribed Celebrex. She has not started this yet. Physical therapy has helped a lot with her neck pain. Dry needling in particular has been effective.   Has some family stressors in her life. She has been looking into support groups to help with grief from losing her husband.  Headache days per month: 30 Headache free days per month: 0  Current Headache Regimen: Preventative: Qudexy 50 mg daily, gabapentin 600 mg BID Abortive: Excedrin, Maxalt 10 mg PRN, baclofen PRN  Prior Therapies                                  Rescue: Excedrin BC powder Cambia Frovatriptan Imitrex Maxalt Relpax Zomig Reglan Baclofen  Flexeril Preventive: Gabapentin Topamax Citalopram Escitalopram Sertraline Paroxetine Occipital nerve block Trigger point injections  Physical Exam:   Vital Signs: LMP  (LMP Unknown)  GENERAL:  well appearing, in no acute distress, alert  SKIN:  Color, texture, turgor normal. No rashes or lesions HEAD:  Normocephalic/atraumatic. RESP: normal respiratory effort MSK:  No gross joint deformities.   NEUROLOGICAL: Mental Status: Alert, oriented to person, place and time, Follows commands, and Speech fluent  and appropriate. Cranial Nerves: PERRL, face symmetric, no dysarthria, hearing grossly intact Motor: moves all extremities equally Gait: normal-based.  IMPRESSION: 61 year old female with a history of anxiety, depression, OSA who presents for follow up of migraines. She is currently having daily headaches. Will increase Qudexy to 100 mg daily and continue current dose of gabapentin for now. Counseled on limiting rescue medication use to avoid rebound headaches. Advised patient to wear CPAP regularly to help prevent morning headaches.  PLAN: -Prevention: Increase Qudexy to 100 mg daily -Rescue: Continue Maxalt 10 mg PRN and baclofen 5-10 mg PRN -Counseled on limiting rescue medication use to avoid rebound headaches -Advised to wear CPAP regularly at night   Follow-up: 4 months  I spent a total of 26 minutes on the date of the service. Headache education was done.  Discussed medication overuse headache and to limit use of acute treatments to no more than 2 days/week or 10 days/month. Discussed medication side effects, adverse reactions and drug interactions. Written educational materials and patient instructions outlining all of the above were given.  Genia Harold, MD 01/20/22 11:13 AM

## 2022-01-27 ENCOUNTER — Encounter (INDEPENDENT_AMBULATORY_CARE_PROVIDER_SITE_OTHER): Payer: Self-pay

## 2022-01-27 ENCOUNTER — Encounter: Payer: Self-pay | Admitting: Podiatry

## 2022-01-27 ENCOUNTER — Ambulatory Visit: Payer: 59 | Admitting: Podiatry

## 2022-01-27 DIAGNOSIS — M7751 Other enthesopathy of right foot: Secondary | ICD-10-CM | POA: Diagnosis not present

## 2022-01-27 DIAGNOSIS — G5791 Unspecified mononeuropathy of right lower limb: Secondary | ICD-10-CM

## 2022-01-27 MED ORDER — TRIAMCINOLONE ACETONIDE 40 MG/ML IJ SUSP
20.0000 mg | Freq: Once | INTRAMUSCULAR | Status: AC
  Administered 2022-01-27: 20 mg

## 2022-01-27 NOTE — Progress Notes (Signed)
She presents today for follow-up of her subtalar joint arthritis and her Achilles tendon tendinitis and Planter fasciitis all that she states is doing really well except for the sinus tarsi area I am getting some pain here and as she points toward the lateral gutter of the ankle.  Objective: Vital signs are stable tellurian x 3 pulses are palpable.  Everything is doing really well no pain on palpation of the Achilles or the plantar fascia though she does have some tenderness on palpation of the superior portion of the sinus tarsi.  She has minimal pain on palpation of the lateral gutter some tenderness on palpation of the tibialis anterior tendon though there is no fluctuance.  Assessment: Subtalar joint capsulitis and possible peroneal nerve neuritis.  Plan: Discussed etiology pathology and surgical therapies at this point went ahead and performed an injection to the sinus tarsi 10 mg Kenalog 5 mg Marcaine follow-up with me as needed.

## 2022-01-28 ENCOUNTER — Encounter: Payer: Self-pay | Admitting: *Deleted

## 2022-01-28 ENCOUNTER — Ambulatory Visit: Payer: Self-pay | Admitting: *Deleted

## 2022-01-28 NOTE — Patient Outreach (Signed)
  Care Coordination   01/28/2022 Name: Rachael Brewer MRN: [id number] DOB: 1961/04/05   Care Coordination Outreach Attempts:  An unsuccessful telephone outreach was attempted today to offer the patient information about available care coordination services as a benefit of their health plan.   Follow Up Plan:  Additional outreach attempts will be made to offer the patient care coordination information and services.   Encounter Outcome:  No Answer  Care Coordination Interventions Activated:  No   Care Coordination Interventions:  No, not indicated    [place], [place] Management [phone]

## 2022-02-01 ENCOUNTER — Ambulatory Visit: Payer: 59 | Admitting: Psychiatry

## 2022-02-16 ENCOUNTER — Encounter: Payer: Self-pay | Admitting: Gastroenterology

## 2022-02-17 ENCOUNTER — Encounter: Payer: Self-pay | Admitting: Gastroenterology

## 2022-02-17 DIAGNOSIS — H40013 Open angle with borderline findings, low risk, bilateral: Secondary | ICD-10-CM | POA: Diagnosis not present

## 2022-02-17 DIAGNOSIS — H524 Presbyopia: Secondary | ICD-10-CM | POA: Diagnosis not present

## 2022-02-17 DIAGNOSIS — H5203 Hypermetropia, bilateral: Secondary | ICD-10-CM | POA: Diagnosis not present

## 2022-02-17 DIAGNOSIS — H52223 Regular astigmatism, bilateral: Secondary | ICD-10-CM | POA: Diagnosis not present

## 2022-02-18 ENCOUNTER — Other Ambulatory Visit: Payer: Self-pay | Admitting: Family Medicine

## 2022-02-18 DIAGNOSIS — G4733 Obstructive sleep apnea (adult) (pediatric): Secondary | ICD-10-CM | POA: Diagnosis not present

## 2022-02-25 ENCOUNTER — Ambulatory Visit
Admission: RE | Admit: 2022-02-25 | Discharge: 2022-02-25 | Disposition: A | Discharge status: Home / Self Care | Payer: 59 | Attending: Gastroenterology | Admitting: Gastroenterology

## 2022-02-25 ENCOUNTER — Encounter: Payer: Self-pay | Admitting: Gastroenterology

## 2022-02-25 ENCOUNTER — Ambulatory Visit: Payer: 59 | Admitting: Anesthesiology

## 2022-02-25 ENCOUNTER — Encounter
Admission: RE | Disposition: A | Discharge status: Home / Self Care | Payer: Self-pay | Source: Home / Self Care | Attending: Gastroenterology

## 2022-02-25 ENCOUNTER — Ambulatory Visit (AMBULATORY_SURGERY_CENTER): Payer: 59 | Admitting: Anesthesiology

## 2022-02-25 ENCOUNTER — Other Ambulatory Visit: Payer: Self-pay

## 2022-02-25 DIAGNOSIS — F32A Depression, unspecified: Secondary | ICD-10-CM | POA: Diagnosis not present

## 2022-02-25 DIAGNOSIS — Z6841 Body Mass Index (BMI) 40.0 and over, adult: Secondary | ICD-10-CM

## 2022-02-25 DIAGNOSIS — Z79899 Other long term (current) drug therapy: Secondary | ICD-10-CM | POA: Insufficient documentation

## 2022-02-25 DIAGNOSIS — Z87891 Personal history of nicotine dependence: Secondary | ICD-10-CM | POA: Insufficient documentation

## 2022-02-25 DIAGNOSIS — G43909 Migraine, unspecified, not intractable, without status migrainosus: Secondary | ICD-10-CM | POA: Insufficient documentation

## 2022-02-25 DIAGNOSIS — Z1211 Encounter for screening for malignant neoplasm of colon: Secondary | ICD-10-CM

## 2022-02-25 DIAGNOSIS — F419 Anxiety disorder, unspecified: Secondary | ICD-10-CM | POA: Insufficient documentation

## 2022-02-25 DIAGNOSIS — K640 First degree hemorrhoids: Secondary | ICD-10-CM

## 2022-02-25 DIAGNOSIS — K219 Gastro-esophageal reflux disease without esophagitis: Secondary | ICD-10-CM | POA: Insufficient documentation

## 2022-02-25 DIAGNOSIS — G4730 Sleep apnea, unspecified: Secondary | ICD-10-CM | POA: Insufficient documentation

## 2022-02-25 DIAGNOSIS — R69 Illness, unspecified: Secondary | ICD-10-CM | POA: Diagnosis not present

## 2022-02-25 DIAGNOSIS — Z96653 Presence of artificial knee joint, bilateral: Secondary | ICD-10-CM | POA: Insufficient documentation

## 2022-02-25 HISTORY — PX: COLONOSCOPY WITH PROPOFOL: SHX5780

## 2022-02-25 SURGERY — COLONOSCOPY WITH PROPOFOL
Anesthesia: General | Site: Rectum

## 2022-02-25 MED ORDER — LIDOCAINE HCL (CARDIAC) PF 100 MG/5ML IV SOSY
PREFILLED_SYRINGE | INTRAVENOUS | Status: DC | PRN
  Administered 2022-02-25: 100 mg via INTRAVENOUS

## 2022-02-25 MED ORDER — LACTATED RINGERS IV SOLN: INTRAVENOUS | Status: DC

## 2022-02-25 MED ORDER — STERILE WATER FOR IRRIGATION IR SOLN
Status: DC | PRN
  Administered 2022-02-25: 150 mL

## 2022-02-25 MED ORDER — PROPOFOL 10 MG/ML IV BOLUS
INTRAVENOUS | Status: DC | PRN
  Administered 2022-02-25 (×4): 50 mg via INTRAVENOUS
  Administered 2022-02-25 (×2): 30 mg via INTRAVENOUS
  Administered 2022-02-25: 40 mg via INTRAVENOUS
  Administered 2022-02-25: 50 mg via INTRAVENOUS

## 2022-02-25 SURGICAL SUPPLY — 21 items

## 2022-02-25 NOTE — Anesthesia Preprocedure Evaluation (Signed)
Anesthesia Evaluation  Patient identified by MRN, date of birth, ID band Patient awake    Reviewed: Allergy & Precautions, H&P , NPO status , Patient's Chart, lab work & pertinent test results, reviewed documented beta blocker date and time   History of Anesthesia Complications (+) history of anesthetic complications  Airway Mallampati: II   Neck ROM: full    Dental  (+) Poor Dentition   Pulmonary sleep apnea , former smoker,    Pulmonary exam normal        Cardiovascular Exercise Tolerance: Poor negative cardio ROS Normal cardiovascular exam Rhythm:regular Rate:Normal     Neuro/Psych  Headaches, PSYCHIATRIC DISORDERS Anxiety Depression    GI/Hepatic Neg liver ROS, GERD  Medicated,  Endo/Other  diabetesMorbid obesity  Renal/GU negative Renal ROS  negative genitourinary   Musculoskeletal   Abdominal   Peds  Hematology negative hematology ROS (+)   Anesthesia Other Findings Past Medical History: No date: Anxiety No date: Arthritis No date: Complication of anesthesia     Comment:  CAUSES MIGRAINE No date: Depression No date: Diabetes (HCC) No date: Edema of both lower extremities No date: GERD (gastroesophageal reflux disease) No date: Headache 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 No date: Migraines No date: Pre-diabetes No date: Sleep apnea     Comment:  CPAP Past Surgical History: 1996: ANTERIOR CRUCIATE LIGAMENT REPAIR; Right 2004: DILATION AND CURETTAGE OF UTERUS No date: FOOT SURGERY; Bilateral No date: JOINT REPLACEMENT; Right 05/02/2019: KNEE ARTHROPLASTY; Right     Comment:  Procedure: COMPUTER ASSISTED TOTAL KNEE ARTHROPLASTY;                Surgeon: Dereck Leep, MD;  Location: [facility] ORS;                Service: Orthopedics;  Laterality: Right; 07/09/2019: KNEE ARTHROPLASTY; Left     Comment:  Procedure: COMPUTER ASSISTED TOTAL KNEE ARTHROPLASTY;                Surgeon: Dereck Leep, MD;   Location: [facility] ORS;                Service: Orthopedics;  Laterality: Left; No date: KNEE SURGERY; Right No date: MENISCUS REPAIR; Bilateral BMI    Body Mass Index: 43.67 kg/m     Reproductive/Obstetrics negative OB ROS                             Anesthesia Physical Anesthesia Plan  ASA: 3  Anesthesia Plan: General   Post-op Pain Management:    Induction:   PONV Risk Score and Plan:   Airway Management Planned:   Additional Equipment:   Intra-op Plan:   Post-operative Plan:   Informed Consent: I have reviewed the patients History and Physical, chart, labs and discussed the procedure including the risks, benefits and alternatives for the proposed anesthesia with the patient or authorized representative who has indicated his/her understanding and acceptance.     Dental Advisory Given  Plan Discussed with: CRNA  Anesthesia Plan Comments:         Anesthesia Quick Evaluation

## 2022-02-25 NOTE — Transfer of Care (Signed)
Immediate Anesthesia Transfer of Care Note  Patient: Rachael Brewer  Procedure(s) Performed: COLONOSCOPY WITH PROPOFOL (Rectum)  Patient Location: PACU  Anesthesia Type: General  Level of Consciousness: awake, alert  and patient cooperative  Airway and Oxygen Therapy: Patient Spontanous Breathing and Patient connected to supplemental oxygen  Post-op Assessment: Post-op Vital signs reviewed, Patient's Cardiovascular Status Stable, Respiratory Function Stable, Patent Airway and No signs of Nausea or vomiting  Post-op Vital Signs: Reviewed and stable  Complications: There were no known notable events for this encounter.

## 2022-02-25 NOTE — Anesthesia Postprocedure Evaluation (Signed)
Anesthesia Post Note  Patient: Rachael Brewer  Procedure(s) Performed: COLONOSCOPY WITH PROPOFOL (Rectum)     Patient location during evaluation: PACU Anesthesia Type: General Level of consciousness: awake and alert Pain management: pain level controlled Vital Signs Assessment: post-procedure vital signs reviewed and stable Respiratory status: spontaneous breathing, nonlabored ventilation, respiratory function stable and patient connected to nasal cannula oxygen Cardiovascular status: blood pressure returned to baseline and stable Postop Assessment: no apparent nausea or vomiting Anesthetic complications: no   There were no known notable events for this encounter.  Molli Barrows

## 2022-02-25 NOTE — H&P (Signed)
Rachael Lame, MD [facility] [address] [place],  [address] Phone: [phone] Fax : [phone]  Primary Care Physician:  Rachael Crews, MD Primary Gastroenterologist:  Rachael Brewer  Pre-Procedure History & Physical: HPI:  Rachael Brewer is a 61 y.o. female is here for a screening colonoscopy.   Past Medical History:  Diagnosis Date   Anxiety    Arthritis    Complication of anesthesia    CAUSES MIGRAINE   Depression    Diabetes ([place])    Edema of both lower extremities    GERD (gastroesophageal reflux disease)    Headache    Menetrier disease    Migraines    Pre-diabetes    Sleep apnea    CPAP    Past Surgical History:  Procedure Laterality Date   ANTERIOR CRUCIATE LIGAMENT REPAIR Right 1996   DILATION AND CURETTAGE OF UTERUS  2004   FOOT SURGERY Bilateral    JOINT REPLACEMENT Right    KNEE ARTHROPLASTY Right 05/02/2019   Procedure: COMPUTER ASSISTED TOTAL KNEE ARTHROPLASTY;  Surgeon: Rachael Leep, MD;  Location: [facility] ORS;  Service: Orthopedics;  Laterality: Right;   KNEE ARTHROPLASTY Left 07/09/2019   Procedure: COMPUTER ASSISTED TOTAL KNEE ARTHROPLASTY;  Surgeon: Rachael Leep, MD;  Location: [facility] ORS;  Service: Orthopedics;  Laterality: Left;   KNEE SURGERY Right    MENISCUS REPAIR Bilateral     Prior to Admission medications   Medication Sig Start Date End Date Taking? Authorizing Provider  albuterol (VENTOLIN HFA) 108 (90 Base) MCG/ACT inhaler INHALE 1-2 PUFFS BY MOUTH EVERY 6 HOURS AS NEEDED FOR WHEEZE OR SHORTNESS OF BREATH 02/17/21  Yes Bacigalupo, Dionne Bucy, MD  aspirin-acetaminophen-caffeine (EXCEDRIN MIGRAINE) [phone] MG tablet Take 2 tablets by mouth every 8 (eight) hours as needed for migraine.    Yes [provider]  Azelastine HCl 137 MCG/SPRAY SOLN Place 1 spray into both nostrils at bedtime.   Yes [provider]  baclofen (LIORESAL) 10 MG tablet Take 1 tablet (10 mg total) by mouth 2 (two) times daily as  needed for muscle spasms. 01/20/22  Yes Genia Harold, MD  buPROPion (WELLBUTRIN XL) 300 MG 24 hr tablet Take 1 tablet (300 mg total) by mouth daily. 10/22/21  Yes Bacigalupo, Dionne Bucy, MD  clobetasol cream (TEMOVATE) 8.41 % Apply 1 Application topically 2 (two) times daily. 12/15/21  Yes [name], FNP  escitalopram (LEXAPRO) 20 MG tablet Take 1 tablet (20 mg total) by mouth daily. 10/22/21  Yes Bacigalupo, Dionne Bucy, MD  fluticasone (FLONASE) 50 MCG/ACT nasal spray Place 2 sprays into both nostrils daily. 01/11/22  Yes [name], FNP  gabapentin (NEURONTIN) 600 MG tablet Take 1 tablet (600 mg total) by mouth 2 (two) times daily. 01/20/22  Yes Genia Harold, MD  Multiple Vitamin (MULTIVITAMIN WITH MINERALS) TABS tablet Take 1 tablet by mouth daily.   Yes [provider]  omeprazole (PRILOSEC) 40 MG capsule TAKE 1 CAPSULE (40 MG TOTAL) BY MOUTH DAILY. 10/23/21  Yes Bacigalupo, Dionne Bucy, MD  rizatriptan (MAXALT) 10 MG tablet Take 10 mg by mouth every 2 (two) hours as needed for migraine (max 2 tablets/24 hrs.).  10/13/15  Yes [provider]  topiramate ER (QUDEXY XR) 100 MG CS24 sprinkle capsule Take 1 capsule (100 mg total) by mouth daily. 01/20/22  Yes Genia Harold, MD  torsemide (DEMADEX) 20 MG tablet Take 2 tablets (40 mg total) by mouth daily. 01/11/22  Yes [name], FNP  buPROPion (  WELLBUTRIN XL) 150 MG 24 hr tablet Take 1 tablet (150 mg total) by mouth daily. Patient not taking: Reported on 02/16/2022 01/11/22   [name], FNP  triamcinolone ointment (KENALOG) 0.5 % Apply 1 Application topically 2 (two) times daily. 12/15/21   [name], FNP    Allergies as of 01/12/2022 - Review Complete 01/11/2022  Allergen Reaction Noted   Bactrim [sulfamethoxazole-trimethoprim] Shortness Of Breath and Swelling 04/18/2019   Sulfa antibiotics Anaphylaxis 05/28/2015   Penicillins Hives 04/29/2014   Sumatriptan  05/28/2015    Family History  Problem Relation Age of Onset    Hypertension Brother    Diabetes Brother    Diabetes Other    Hypertension Other    Glaucoma Other    Breast cancer Maternal Aunt        50's    Social History   Socioeconomic History   Marital status: Widowed    Spouse name: Not on file   Number of children: 1   Years of education: Not on file   Highest education level: Not on file  Occupational History   Not on file  Tobacco Use   Smoking status: Former    Packs/day: 1.00    Years: 15.00    Total pack years: 15.00    Types: Cigarettes    Quit date: 06/21/1999    Years since quitting: 22.6   Smokeless tobacco: Never  Vaping Use   Vaping Use: Never used  Substance and Sexual Activity   Alcohol use: Yes    Alcohol/week: 0.0 standard drinks of alcohol    Comment: occasional   Drug use: No   Sexual activity: Yes    Birth control/protection: None  Other Topics Concern   Not on file  Social History Narrative   1 adopted boy and 2 grandsons live with her- per patient the boys moved back with their mother 11/2021   Caffeine- coffee 1 c daily   Patient loss he husband November 28,2022.   Lives only with her son.   Social Determinants of Health   Financial Resource Strain: Low Risk  (01/13/2022)   Overall Financial Resource Strain (CARDIA)    Difficulty of Paying Living Expenses: Not hard at all  Food Insecurity: No Food Insecurity (01/13/2022)   Hunger Vital Sign    Worried About Running Out of Food in the Last Year: Never true    Ran Out of Food in the Last Year: Never true  Transportation Needs: Not on file  Physical Activity: Not on file  Stress: Not on file  Social Connections: Not on file  Intimate Partner Violence: Not on file    Review of Systems: See HPI, otherwise negative ROS  Physical Exam: BP (!) 171/76   Pulse 66   Temp (!) 97.5 F (36.4 C) (Temporal)   Ht '5\' 3"'$  (1.6 m)   Wt 111.8 kg   LMP  (LMP Unknown)   SpO2 96%   BMI 43.67 kg/m  General:   Alert,  pleasant and cooperative in  NAD Head:  Normocephalic and atraumatic. Neck:  Supple; no masses or thyromegaly. Lungs:  Clear throughout to auscultation.    Heart:  Regular rate and rhythm. Abdomen:  Soft, nontender and nondistended. Normal bowel sounds, without guarding, and without rebound.   Neurologic:  Alert and  oriented x4;  grossly normal neurologically.  Impression/Plan: Rachael Brewer is now here to undergo a screening colonoscopy.  Risks, benefits, and alternatives regarding colonoscopy have been reviewed with the patient.  Questions have been answered.  All parties agreeable.

## 2022-02-25 NOTE — Op Note (Signed)
[facility] Gastroenterology Patient Name: Rachael Brewer Procedure Date: 02/25/2022 8:17 AM MRN: [id number] Account #: [id number] Date of Birth: 12/28/1960 Admit Type: Outpatient Age: 61 Room: [facility] OR ROOM 01 Gender: Female Note Status: Finalized Instrument Name: [id number] Procedure:             Colonoscopy Indications:           Screening for colorectal malignant neoplasm Providers:             [name] MD, MD Referring MD:          Dionne Bucy. Bacigalupo (Referring MD) Medicines:             Propofol per Anesthesia Complications:         No immediate complications. Procedure:             Pre-Anesthesia Assessment:                        - Prior to the procedure, a History and Physical was                         performed, and patient medications and allergies were                         reviewed. The patient's tolerance of previous                         anesthesia was also reviewed. The risks and benefits                         of the procedure and the sedation options and risks                         were discussed with the patient. All questions were                         answered, and informed consent was obtained. Prior                         Anticoagulants: The patient has taken no previous                         anticoagulant or antiplatelet agents. ASA Grade                         Assessment: II - A patient with mild systemic disease.                         After reviewing the risks and benefits, the patient                         was deemed in satisfactory condition to undergo the                         procedure.                        After obtaining informed consent, the colonoscope was  passed under direct vision. Throughout the procedure,                         the patient's blood pressure, pulse, and oxygen                         saturations were monitored continuously. The                         Colonoscope  was introduced through the anus and                         advanced to the the cecum, identified by appendiceal                         orifice and ileocecal valve. The colonoscopy was                         performed without difficulty. The patient tolerated                         the procedure well. The quality of the bowel                         preparation was excellent. Findings:      The perianal and digital rectal examinations were normal.      Non-bleeding internal hemorrhoids were found during retroflexion. The       hemorrhoids were Grade I (internal hemorrhoids that do not prolapse). Impression:            - Non-bleeding internal hemorrhoids.                        - No specimens collected. Recommendation:        - Discharge patient to home.                        - Resume previous diet.                        - Continue present medications.                        - Repeat colonoscopy in 10 years for screening                         purposes. Procedure Code(s):     --- Professional ---                        [phone], Colonoscopy, flexible; diagnostic, including                         collection of specimen(s) by brushing or washing, when                         performed (separate procedure) Diagnosis Code(s):     --- Professional ---                        Z12.11, Encounter for screening for malignant neoplasm  of colon CPT copyright 2019 American Medical Association. All rights reserved. The codes documented in this report are preliminary and upon coder review may  be revised to meet current compliance requirements. [name] MD, MD 02/25/2022 8:48:09 AM This report has been signed electronically. Number of Addenda: 0 Note Initiated On: 02/25/2022 8:17 AM Scope Withdrawal Time: 0 hours 7 minutes 36 seconds  Total Procedure Duration: 0 hours 15 minutes 24 seconds  Estimated Blood Loss:  Estimated blood loss: none.      [facility]

## 2022-02-26 ENCOUNTER — Encounter: Payer: Self-pay | Admitting: Gastroenterology

## 2022-03-26 NOTE — Progress Notes (Signed)
I,Rachael Brewer,acting as a scribe for Rachael Paganini, MD.,have documented all relevant documentation on the behalf of Rachael Paganini, MD,as directed by  Rachael Paganini, MD while in the presence of Rachael Paganini, MD.     Established patient visit   Patient: Rachael Brewer   DOB: Nov 29, 1961   61 y.o. Female  MRN: [id number] Visit Date: 03/29/2022  Today's healthcare provider: Lavon Paganini, MD   Chief Complaint  Patient presents with   Depression   Subjective    HPI  Depression, Follow-up  She  was last seen for this 6 weeks ago. Changes made at last visit include titrate wellbutrin to '450mg'$ .    She reports excellent compliance with treatment. She is not having side effects.   She reports excellent tolerance of treatment. Current symptoms include: depressed mood, fatigue, feelings of worthlessness/guilt, hopelessness, and hypersomnia  She feels she is Improved since last visit. Patient reports taking wellbutrin '300mg'$  daily.  No longer having afternoon fatigue like before. Getting out with her puppy more lately. Started grief counseling this week and is feeling the grief.     03/29/2022    1:13 PM 01/13/2022    9:12 AM 01/11/2022   10:14 AM  Depression screen PHQ 2/9  Decreased Interest '3 2 2  '$ Down, Depressed, Hopeless '2 1 1  '$ PHQ - 2 Score '5 3 3  '$ Altered sleeping '1 1 1  '$ Tired, decreased energy '2 3 3  '$ Change in appetite '3 2 2  '$ Feeling bad or failure about yourself  '1 3 3  '$ Trouble concentrating 0 0 0  Moving slowly or fidgety/restless 0 0 0  Suicidal thoughts 0 0 0  PHQ-9 Score '12 12 12  '$ Difficult doing work/chores Very difficult  Somewhat difficult       03/29/2022    1:12 PM 09/22/2017    8:42 AM 06/24/2017    8:55 AM 05/11/2017    9:44 AM  GAD 7 : Generalized Anxiety Score  Nervous, Anxious, on Edge 0 0 2 1  Control/stop worrying 0 0 2 0  Worry too much - different things 0 1 0 0  Trouble relaxing 3 0 3 1  Restless 0 0 0 0  Easily  annoyed or irritable 0 '1 3 3  '$ Afraid - awful might happen 0 0 0 0  Total GAD 7 Score '3 2 10 5  '$ Anxiety Difficulty Somewhat difficult Somewhat difficult Somewhat difficult Very difficult     -----------------------------------------------------------------------------------------  Medications: Outpatient Medications Prior to Visit  Medication Sig   albuterol (VENTOLIN HFA) 108 (90 Base) MCG/ACT inhaler INHALE 1-2 PUFFS BY MOUTH EVERY 6 HOURS AS NEEDED FOR WHEEZE OR SHORTNESS OF BREATH   aspirin-acetaminophen-caffeine (EXCEDRIN MIGRAINE) 250-250-65 MG tablet Take 2 tablets by mouth every 8 (eight) hours as needed for migraine.    Azelastine HCl 137 MCG/SPRAY SOLN Place 1 spray into both nostrils at bedtime.   baclofen (LIORESAL) 10 MG tablet Take 1 tablet (10 mg total) by mouth 2 (two) times daily as needed for muscle spasms.   buPROPion (WELLBUTRIN XL) 300 MG 24 hr tablet Take 1 tablet (300 mg total) by mouth daily.   clobetasol cream (TEMOVATE) 0.73 % Apply 1 Application topically 2 (two) times daily.   escitalopram (LEXAPRO) 20 MG tablet Take 1 tablet (20 mg total) by mouth daily.   fluticasone (FLONASE) 50 MCG/ACT nasal spray Place 2 sprays into both nostrils daily.   gabapentin (NEURONTIN) 600 MG tablet Take 1 tablet (600 mg total) by mouth  2 (two) times daily.   Multiple Vitamin (MULTIVITAMIN WITH MINERALS) TABS tablet Take 1 tablet by mouth daily.   omeprazole (PRILOSEC) 40 MG capsule TAKE 1 CAPSULE (40 MG TOTAL) BY MOUTH DAILY.   rizatriptan (MAXALT) 10 MG tablet Take 10 mg by mouth every 2 (two) hours as needed for migraine (max 2 tablets/24 hrs.).    topiramate ER (QUDEXY XR) 100 MG CS24 sprinkle capsule Take 1 capsule (100 mg total) by mouth daily.   torsemide (DEMADEX) 20 MG tablet Take 2 tablets (40 mg total) by mouth daily.   triamcinolone ointment (KENALOG) 0.5 % Apply 1 Application topically 2 (two) times daily.   [DISCONTINUED] buPROPion (WELLBUTRIN XL) 150 MG 24 hr tablet  Take 1 tablet (150 mg total) by mouth daily. (Patient not taking: Reported on 03/29/2022)   No facility-administered medications prior to visit.    Review of Systems  Constitutional:  Positive for appetite change and fatigue.  Respiratory:  Negative for chest tightness and shortness of breath.   Cardiovascular:  Negative for chest pain and palpitations.  Gastrointestinal:  Negative for abdominal pain, constipation, diarrhea, nausea and vomiting.  Psychiatric/Behavioral:  Positive for dysphoric mood and sleep disturbance. The patient is nervous/anxious.        Objective    BP 134/80 (BP Location: Left Arm, Patient Position: Sitting, Cuff Size: Large)   Pulse 68   Temp 97.9 F (36.6 C) (Oral)   Resp 16   Wt 252 lb 6.4 oz (114.5 kg)   LMP  (LMP Unknown)   BMI 44.71 kg/m    Physical Exam Vitals reviewed.  Constitutional:      General: She is not in acute distress.    Appearance: Normal appearance. She is well-developed. She is not diaphoretic.  HENT:     Head: Normocephalic and atraumatic.  Eyes:     General: No scleral icterus.    Conjunctiva/sclera: Conjunctivae normal.  Neck:     Thyroid: No thyromegaly.     Comments: Muscle tightness Cardiovascular:     Rate and Rhythm: Normal rate and regular rhythm.     Pulses: Normal pulses.     Heart sounds: Normal heart sounds. No murmur heard. Pulmonary:     Effort: Pulmonary effort is normal. No respiratory distress.     Breath sounds: Normal breath sounds. No wheezing, rhonchi or rales.  Musculoskeletal:     Cervical back: Neck supple.     Right lower leg: No edema.     Left lower leg: No edema.  Lymphadenopathy:     Cervical: No cervical adenopathy.  Skin:    General: Skin is warm and dry.     Findings: No rash.  Neurological:     Mental Status: She is alert and oriented to person, place, and time. Mental status is at baseline.  Psychiatric:        Mood and Affect: Mood normal.        Behavior: Behavior normal.        No results found for any visits on 03/29/22.  Assessment & Plan     Problem List Items Addressed This Visit       Musculoskeletal and Integument   Neck muscle spasm    Chronic, intermittent issue Was doing well with PT and dry needling Will refer back to [facility]      Relevant Orders   Ambulatory referral to Physical Therapy     Other   Depression, recurrent ([place]) - Primary    Stable Still having  periods of higher depression symptoms while initiating grief counseling Recommend increasing wellbutrin to 450 XL Continue lexapro at current dose      Relevant Medications   buPROPion (WELLBUTRIN XL) 150 MG 24 hr tablet   Atypical squamous cell changes of undetermined significance (ASCUS) on cervical cytology with negative high risk human papilloma virus (HPV) test result    Patient was advised to see GYN by NP at last visit She had an ASCUS and HPV negative Pap in 2021 She is just due for her to be repeated next year By guidelines, she does not need colposcopy or biopsy at this time      Fatigue due to depression    Improving, despite no changes in meds yet      Relevant Medications   buPROPion (WELLBUTRIN XL) 150 MG 24 hr tablet   Other Visit Diagnoses     Need for influenza vaccination       Relevant Orders   Flu Vaccine QUAD 37moIM (Fluarix, Fluzone & Alfiuria Quad PF) (Completed)        Return in about 6 months (around 09/28/2022) for chronic disease f/u.      I, ALavon Paganini MD, have reviewed all documentation for this visit. The documentation on 03/29/22 for the exam, diagnosis, procedures, and orders are all accurate and complete.   Grayson White, ADionne Bucy MD, MPH [facility]

## 2022-03-29 ENCOUNTER — Encounter: Payer: Self-pay | Admitting: Family Medicine

## 2022-03-29 ENCOUNTER — Ambulatory Visit: Payer: 59 | Admitting: Family Medicine

## 2022-03-29 VITALS — BP 134/80 | HR 68 | Temp 97.9°F | Resp 16 | Wt 252.4 lb

## 2022-03-29 DIAGNOSIS — R5383 Other fatigue: Secondary | ICD-10-CM | POA: Diagnosis not present

## 2022-03-29 DIAGNOSIS — F32A Depression, unspecified: Secondary | ICD-10-CM

## 2022-03-29 DIAGNOSIS — R69 Illness, unspecified: Secondary | ICD-10-CM | POA: Diagnosis not present

## 2022-03-29 DIAGNOSIS — M62838 Other muscle spasm: Secondary | ICD-10-CM

## 2022-03-29 DIAGNOSIS — F339 Major depressive disorder, recurrent, unspecified: Secondary | ICD-10-CM | POA: Diagnosis not present

## 2022-03-29 DIAGNOSIS — Z23 Encounter for immunization: Secondary | ICD-10-CM | POA: Diagnosis not present

## 2022-03-29 DIAGNOSIS — R87610 Atypical squamous cells of undetermined significance on cytologic smear of cervix (ASC-US): Secondary | ICD-10-CM

## 2022-03-29 MED ORDER — BUPROPION HCL ER (XL) 150 MG PO TB24: 150.0000 mg | ORAL_TABLET | Freq: Every day | ORAL | 0 refills | Status: DC

## 2022-03-29 NOTE — Assessment & Plan Note (Signed)
Improving, despite no changes in meds yet

## 2022-03-29 NOTE — Assessment & Plan Note (Signed)
Chronic, intermittent issue Was doing well with PT and dry needling Will refer back to [facility]

## 2022-03-29 NOTE — Patient Instructions (Addendum)
Call [facility] to schedule a mammogram [phone]  Ask at the pharmacy about RSV and COVID vaccines

## 2022-03-29 NOTE — Assessment & Plan Note (Signed)
Stable Still having periods of higher depression symptoms while initiating grief counseling Recommend increasing wellbutrin to 450 XL Continue lexapro at current dose

## 2022-03-29 NOTE — Assessment & Plan Note (Signed)
Patient was advised to see GYN by NP at last visit She had an ASCUS and HPV negative Pap in 2021 She is just due for her to be repeated next year By guidelines, she does not need colposcopy or biopsy at this time

## 2022-04-03 ENCOUNTER — Other Ambulatory Visit: Payer: Self-pay | Admitting: Family Medicine

## 2022-04-05 NOTE — Telephone Encounter (Signed)
Unable to refill per protocol, Rx expired. Medication was discontinued by PCP 10/22/21, dose change. Will refuse.  Requested Prescriptions  Pending Prescriptions Disp Refills  . buPROPion (WELLBUTRIN) 100 MG tablet [Pharmacy Med Name: BUPROPION HCL 100 MG TABLET] 180 tablet 0    Sig: TAKE 1 TABLET BY MOUTH 2 (TWO) TIMES DAILY. PLEASE SCHEDULE OFFICE VISIT BEFORE ANY FUTURE REFILL.     Psychiatry: Antidepressants - bupropion Passed - 04/03/2022 10:08 AM      Passed - Cr in normal range and within 360 days    Creat  Date Value Ref Range Status  05/13/2017 0.92 0.50 - 1.05 mg/dL Final    Comment:    For patients >76 years of age, the reference limit for Creatinine is approximately 13% higher for people identified as African-American. .    Creatinine, Ser  Date Value Ref Range Status  11/09/2021 0.84 0.57 - 1.00 mg/dL Final         Passed - AST in normal range and within 360 days    AST  Date Value Ref Range Status  11/09/2021 23 0 - 40 IU/L Final         Passed - ALT in normal range and within 360 days    ALT  Date Value Ref Range Status  11/09/2021 17 0 - 32 IU/L Final         Passed - Completed PHQ-2 or PHQ-9 in the last 360 days      Passed - Last BP in normal range    BP Readings from Last 1 Encounters:  03/29/22 134/80         Passed - Valid encounter within last 6 months    Recent Outpatient Visits          1 week ago Depression, recurrent [facility])   [facility] [place], Dionne Bucy, MD   2 months ago Annual physical exam   [facility] [name], FNP   3 months ago Allergic dermatitis   [facility] Tally Joe T, FNP   5 months ago Spondylosis of cervical region without myelopathy or radiculopathy   [facility], Dionne Bucy, MD   5 months ago Depression, recurrent [facility])   [place], Dionne Bucy, MD      Future Appointments            In 5 months Bacigalupo,  Dionne Bucy, MD [facility], [place]

## 2022-04-06 ENCOUNTER — Ambulatory Visit: Payer: 59 | Admitting: Podiatry

## 2022-04-06 ENCOUNTER — Telehealth: Payer: Self-pay | Admitting: *Deleted

## 2022-04-06 ENCOUNTER — Ambulatory Visit (INDEPENDENT_AMBULATORY_CARE_PROVIDER_SITE_OTHER): Payer: 59

## 2022-04-06 DIAGNOSIS — S92354A Nondisplaced fracture of fifth metatarsal bone, right foot, initial encounter for closed fracture: Secondary | ICD-10-CM | POA: Diagnosis not present

## 2022-04-06 DIAGNOSIS — M7751 Other enthesopathy of right foot: Secondary | ICD-10-CM

## 2022-04-06 NOTE — Telephone Encounter (Signed)
Patient is calling because there is a lot of pressure on ball of foot is causing it to hurt more,can she take the boot off while resting and sitting or is it ok to wear the [place] shoe(has one at home from previously)?

## 2022-04-06 NOTE — Telephone Encounter (Signed)
Spoke with patient giving ok to wear the shoe or take boot off while resting, verbalized understanding and does not need any pain medicines right now, is taking tylenol, will call back if she feels the need for additional  pain medicines.

## 2022-04-07 ENCOUNTER — Encounter: Payer: Self-pay | Admitting: Podiatry

## 2022-04-07 DIAGNOSIS — R519 Headache, unspecified: Secondary | ICD-10-CM | POA: Diagnosis not present

## 2022-04-07 DIAGNOSIS — M542 Cervicalgia: Secondary | ICD-10-CM | POA: Diagnosis not present

## 2022-04-07 NOTE — Progress Notes (Signed)
Subjective:  Patient ID: Rachael Brewer, female    DOB: 1961-05-08,  MRN: [id number]  Chief Complaint  Patient presents with   Foot Injury    61 y.o. female presents with the above complaint.  Patient presents after undergoing a right fifth metatarsal fracture.  Patient states that she hurt her foot on Thursday a lot of pain discomfort.  She has not seen either urgent care or got any other x-rays she is here to see ER first and get her opinion.  She is having a lot of pain discomfort.  Pain scale 7 out of 10 hurts with ambulation hurts with pressure.  She has not been ever seen for this.   Review of Systems: Negative except as noted in the HPI. Denies N/V/F/Ch.  Past Medical History:  Diagnosis Date   Anxiety    Arthritis    Complication of anesthesia    CAUSES MIGRAINE   Depression    Diabetes (HCC)    Edema of both lower extremities    GERD (gastroesophageal reflux disease)    Headache    Menetrier disease    Migraines    Pre-diabetes    Sleep apnea    CPAP    Current Outpatient Medications:    albuterol (VENTOLIN HFA) 108 (90 Base) MCG/ACT inhaler, INHALE 1-2 PUFFS BY MOUTH EVERY 6 HOURS AS NEEDED FOR WHEEZE OR SHORTNESS OF BREATH, Disp: 6.7 each, Rfl: 3   aspirin-acetaminophen-caffeine (EXCEDRIN MIGRAINE) 250-250-65 MG tablet, Take 2 tablets by mouth every 8 (eight) hours as needed for migraine. , Disp: , Rfl:    Azelastine HCl 137 MCG/SPRAY SOLN, Place 1 spray into both nostrils at bedtime., Disp: , Rfl:    baclofen (LIORESAL) 10 MG tablet, Take 1 tablet (10 mg total) by mouth 2 (two) times daily as needed for muscle spasms., Disp: 30 tablet, Rfl: 5   buPROPion (WELLBUTRIN XL) 150 MG 24 hr tablet, Take 1 tablet (150 mg total) by mouth daily., Disp: 90 tablet, Rfl: 0   buPROPion (WELLBUTRIN XL) 300 MG 24 hr tablet, Take 1 tablet (300 mg total) by mouth daily., Disp: 90 tablet, Rfl: 1   clobetasol cream (TEMOVATE) 4.09 %, Apply 1 Application topically 2 (two) times  daily., Disp: 30 g, Rfl: 0   escitalopram (LEXAPRO) 20 MG tablet, Take 1 tablet (20 mg total) by mouth daily., Disp: 90 tablet, Rfl: 3   fluticasone (FLONASE) 50 MCG/ACT nasal spray, Place 2 sprays into both nostrils daily., Disp: 16 g, Rfl: 11   gabapentin (NEURONTIN) 600 MG tablet, Take 1 tablet (600 mg total) by mouth 2 (two) times daily., Disp: 60 tablet, Rfl: 6   Multiple Vitamin (MULTIVITAMIN WITH MINERALS) TABS tablet, Take 1 tablet by mouth daily., Disp: , Rfl:    omeprazole (PRILOSEC) 40 MG capsule, TAKE 1 CAPSULE (40 MG TOTAL) BY MOUTH DAILY., Disp: 90 capsule, Rfl: 1   rizatriptan (MAXALT) 10 MG tablet, Take 10 mg by mouth every 2 (two) hours as needed for migraine (max 2 tablets/24 hrs.). , Disp: , Rfl: 0   topiramate ER (QUDEXY XR) 100 MG CS24 sprinkle capsule, Take 1 capsule (100 mg total) by mouth daily., Disp: 30 capsule, Rfl: 6   torsemide (DEMADEX) 20 MG tablet, Take 2 tablets (40 mg total) by mouth daily., Disp: 180 tablet, Rfl: 0   triamcinolone ointment (KENALOG) 0.5 %, Apply 1 Application topically 2 (two) times daily., Disp: 30 g, Rfl: 0  Social History   Tobacco Use  Smoking Status Former  Packs/day: 1.00   Years: 15.00   Total pack years: 15.00   Types: Cigarettes   Quit date: 06/21/1999   Years since quitting: 22.8  Smokeless Tobacco Never    Allergies  Allergen Reactions   Bactrim [Sulfamethoxazole-Trimethoprim] Shortness Of Breath and Swelling   Sulfa Antibiotics Anaphylaxis   Penicillins Hives    Did it involve swelling of the face/tongue/throat, SOB, or low BP? No Did it involve sudden or severe rash/hives, skin peeling, or any reaction on the inside of your mouth or nose? No Did you need to seek medical attention at a hospital or doctor's office? No When did it last happen?     10 years ago  If all above answers are "NO", may proceed with cephalosporin use.  Did it involve swelling of the face/tongue/throat, SOB, or low BP? No Did it involve sudden  or severe rash/hives, skin peeling, or any reaction on the inside of your mouth or nose? No Did you need to seek medical attention at a hospital or doctor's office? No When did it last happen?     10 years ago  If all above answers are "NO", may proceed with cephalosporin use.   Sumatriptan     Made migraine more intense and scalp felt like it was on fire   Objective:  There were no vitals filed for this visit. There is no height or weight on file to calculate BMI. Constitutional Well developed. Well nourished.  Vascular Dorsalis pedis pulses palpable bilaterally. Posterior tibial pulses palpable bilaterally. Capillary refill normal to all digits.  No cyanosis or clubbing noted. Pedal hair growth normal.  Neurologic Normal speech. Oriented to person, place, and time. Epicritic sensation to light touch grossly present bilaterally.  Dermatologic Nails well groomed and normal in appearance. No open wounds. No skin lesions.  Orthopedic: Pain on palpation along the course of the fifth metatarsal head and midshaft.  No pain at the third and fourth digit.  No pain at the metatarsophalangeal joint of the fifth.  Ecchymosis and bruising noted   Radiographs: 3 views of skeletally mature adult right foot: Oblique fracture noted at the metatarsal head.  Mild shortening.  Noted.  No angulation noted.  No bony abnormalities identified.  Appears to be closing well. Assessment:   1. Nondisplaced fracture of fifth metatarsal bone, right foot, initial encounter for closed fracture    Plan:  Patient was evaluated and treated and all questions answered.  Right fifth metatarsal fracture head -All questions and concerns were discussed with the patient at this time. -.  At this time no surgical plans are indicated.  I discussed with her that she will benefit from cam boot immobilization to allow the soft tissue to heal.  She agrees with the plan and will do so. -Could be weightbearing as tolerated in  cam boot she already has 1 at home she will place herself in it.  No follow-ups on file.

## 2022-04-15 ENCOUNTER — Other Ambulatory Visit: Payer: Self-pay | Admitting: Family Medicine

## 2022-05-05 ENCOUNTER — Ambulatory Visit
Admission: RE | Admit: 2022-05-05 | Discharge: 2022-05-05 | Disposition: A | Discharge status: Home / Self Care | Payer: 59 | Source: Ambulatory Visit | Attending: Family Medicine | Admitting: Family Medicine

## 2022-05-05 DIAGNOSIS — Z1231 Encounter for screening mammogram for malignant neoplasm of breast: Secondary | ICD-10-CM | POA: Diagnosis not present

## 2022-05-07 NOTE — Progress Notes (Signed)
Hi Rachael Brewer,  Normal mammogram; repeat in 1 year.  Please let us know if you have any questions.  Thank you,  Tally Joe, FNP

## 2022-05-14 ENCOUNTER — Other Ambulatory Visit: Payer: Self-pay | Admitting: Family Medicine

## 2022-05-14 NOTE — Telephone Encounter (Signed)
Requested Prescriptions  Pending Prescriptions Disp Refills   omeprazole (PRILOSEC) 40 MG capsule [Pharmacy Med Name: OMEPRAZOLE DR 40 MG CAPSULE] 90 capsule 2    Sig: TAKE 1 CAPSULE (40 MG TOTAL) BY MOUTH DAILY.     Gastroenterology: Proton Pump Inhibitors Passed - 05/14/2022  2:07 AM      Passed - Valid encounter within last 12 months    Recent Outpatient Visits           1 month ago Depression, recurrent [facility])   [facility] [place], Dionne Bucy, MD   4 months ago Annual physical exam   [facility] [name], FNP   5 months ago Allergic dermatitis   [facility] Tally Joe T, FNP   6 months ago Spondylosis of cervical region without myelopathy or radiculopathy   [facility], Dionne Bucy, MD   6 months ago Depression, recurrent [facility])   [place], Dionne Bucy, MD       Future Appointments             In 4 months Bacigalupo, Dionne Bucy, MD [facility], [place]

## 2022-05-18 ENCOUNTER — Ambulatory Visit (INDEPENDENT_AMBULATORY_CARE_PROVIDER_SITE_OTHER): Payer: 59 | Admitting: Family Medicine

## 2022-05-18 ENCOUNTER — Ambulatory Visit: Payer: 59 | Admitting: Family Medicine

## 2022-05-18 ENCOUNTER — Encounter: Payer: Self-pay | Admitting: Family Medicine

## 2022-05-18 VITALS — BP 132/71 | HR 67 | Resp 16 | Wt 251.0 lb

## 2022-05-18 DIAGNOSIS — M7712 Lateral epicondylitis, left elbow: Secondary | ICD-10-CM | POA: Diagnosis not present

## 2022-05-18 DIAGNOSIS — M533 Sacrococcygeal disorders, not elsewhere classified: Secondary | ICD-10-CM

## 2022-05-18 NOTE — Progress Notes (Signed)
    SUBJECTIVE:   CHIEF COMPLAINT / HPI:   ABDOMINAL ISSUES - rectal discomfort/pressure, comes and goes, last experienced on Saturday. Notices more with longer car rides.  - some runny stool last night, none since.  Duration: few days Nature: pressure, weakness Location: rectal  Radiation: no Frequency: intermittent Alleviating factors: relieving pressure from area, not sitting on buttock Aggravating factors: sitting Treatments attempted: none Constipation: no Diarrhea: some loose stool last night. Episodes of diarrhea/day: 5 times yesterday.  Mucous in the stool: no Nausea: no Vomiting: no Episodes of vomit/day: Melena or hematochezia: no Fever: no Weight loss: no Recent colonoscopy 02/2022, non bleeding internal hemorrhoids, no polyps.  ARM PAIN - had some L upper arm soreness after vaccination last Monday, subsided next day with resultant L elbow soreness - feels grip strength lessened Duration: days Location: diffuse L elbow Mechanism of injury: unknown Onset: gradual  Quality:  soreness Frequency: constant Radiation: no Aggravating factors: lifting objects  Alleviating factors: rest  Status: stable Treatments attempted: rest  Relief with NSAIDs?:  No NSAIDs Taken Swelling: no Redness: no  Warmth: no Trauma: no Fever: no Decreased sensation: yes Paresthesias: yes Weakness: yes   OBJECTIVE:   BP 132/71 (BP Location: Left Arm, Patient Position: Sitting, Cuff Size: Large)   Pulse 67   Resp 16   Wt 251 lb (113.9 kg)   LMP  (LMP Unknown)   SpO2 95%   BMI 44.46 kg/m   Gen: well appearing, in NAD L elbow: 5/5 UE strength, intact grip strength. Negative Tinel's over cubital tunnel. Brachioradialis and triceps reflexes intact. Reproducible pain with resisted supination. 5/5 resisted strength without pain with finger abduction/adduction, pincer grasp.  Abd: soft, NTND, +BS Rectal exam: negative without mass, lesions or tenderness, sphincter tone normal.  Few nonthrombosed external rectal skin tags.  Ext: WWP, no edema   ASSESSMENT/PLAN:   Lateral epicondylitis 2/2 ?overuse vs ?vaccine side effect given timing. Neurovascularly and strength intact. Recommend usual conservative care with rest, NSAIDs for antiinflammatory and pain relief. F/u for new or worsened symptoms.   Coccydynia Rectal exam wnl. Suspect 2/2 prolonged pressure on coccyx in absence of trauma given recent long car rides makes worse and relieving pressure relieves pain. Recommend donut cushion for prolonged sitting. F/u if bowel habits change, new or worsened symptoms.   Myles Gip, DO

## 2022-05-19 ENCOUNTER — Ambulatory Visit (INDEPENDENT_AMBULATORY_CARE_PROVIDER_SITE_OTHER): Payer: 59

## 2022-05-19 ENCOUNTER — Ambulatory Visit: Payer: 59 | Admitting: Podiatry

## 2022-05-19 ENCOUNTER — Encounter: Payer: Self-pay | Admitting: Podiatry

## 2022-05-19 DIAGNOSIS — S92504D Nondisplaced unspecified fracture of right lesser toe(s), subsequent encounter for fracture with routine healing: Secondary | ICD-10-CM | POA: Diagnosis not present

## 2022-05-19 DIAGNOSIS — S92354A Nondisplaced fracture of fifth metatarsal bone, right foot, initial encounter for closed fracture: Secondary | ICD-10-CM

## 2022-05-19 NOTE — Progress Notes (Signed)
She presents today for follow-up of her fracture fifth metatarsal of her right foot.  States that it feels better and ready to get out of this boot shoe.  Objective: Vital signs stable alert oriented x 3 there is no erythema edema cellulitis drainage or odor she still has tenderness on palpation of her right fifth metatarsal.  Radiographs taken today demonstrate a minimally displaced oblique fracture of the fifth metatarsal right foot..  There is bone callus development.  Assessment: Bone callus with fracture fifth met right normal healing.  Plan: At this point recommend that she continue the use of the Darco wedge shoe for another 3 weeks and follow-up with me in the fourth week for x-rays.

## 2022-05-20 ENCOUNTER — Telehealth: Payer: Self-pay | Admitting: Family Medicine

## 2022-05-20 ENCOUNTER — Telehealth: Payer: Self-pay | Admitting: Psychiatry

## 2022-05-20 ENCOUNTER — Other Ambulatory Visit: Payer: Self-pay | Admitting: Family Medicine

## 2022-05-20 NOTE — Telephone Encounter (Signed)
PA was submitted.

## 2022-05-20 NOTE — Progress Notes (Signed)
Referring:  Virginia Crews, MD [address] [place],  [place] [address]  PCP: Virginia Crews, MD  Neurology was asked to evaluate Rachael Brewer, a 61 year old female for a chief complaint of headaches.  Our recommendations of care will be communicated by shared medical record.    CC:  headaches Chief Complaint  Patient presents with   Follow-up    Migraine fu , rm 2 Reports 4-5 mild migraines a week, states Excedrin works well for morning headaches and maxalt for headaches that occur during pm      History provided from self  HPI:  Follow-up Visit   Last visit: 01/20/2022 with Dr. Billey Gosling   Brief HPI: 61 year old female with a history of anxiety, depression, OSA who follows in clinic for migraines. MRI brain 10/27/20 was unremarkable.   At her last visit Qudexy was increased to 100 mg daily and continued gabapentin for headache prevention.  Also discussed avoidance of OTC overuse    Interval History: She currently experiences migraine about every other day typically upon awakening.  She does believe headache severity has improved since increasing Qudexy dosage.  She will occasionally experience 1 in the afternoon.  She will typically take Excedrin with morning headache and rizatriptan with evening headache.  She has not been using her CPAP routinely over the past few months as her house is currently under Architect.  She continues to work with PT for cervicalgia and dry needling which has been greatly beneficial.  She continues to experience significant family stressors especially since the passing of her husband a year ago, believes she has been able to start the grieving process over the past 6 months.  She routinely participates with grief counseling which has been beneficial.  She does complain of memory loss, will misplace items around her home, will forget driving directions to known locations, forgot she already saw renovations done at her friend's  home. Believes this has been more present over the past 6 months.      Headache days per month: 15-30 Headache free days per month: 0-15   Current Headache Regimen: Preventative: Qudexy 100 mg daily, gabapentin 600 mg BID Abortive: Excedrin, Maxalt 10 mg PRN, baclofen PRN   Prior Therapies                                  Rescue: Excedrin BC powder Cambia Frovatriptan Imitrex Maxalt Relpax Zomig Reglan Baclofen  Flexeril Preventive: Gabapentin Topamax Citalopram Escitalopram Sertraline Paroxetine Occipital nerve block Trigger point injections  LABS:    Latest Ref Rng & Units 01/11/2022   10:51 AM 09/29/2020    3:20 PM 01/03/2020    9:06 AM  CBC  WBC 3.4 - 10.8 x10E3/uL 6.3  6.8  6.3   Hemoglobin 11.1 - 15.9 g/dL 15.3  15.9  15.4   Hematocrit 34.0 - 46.6 % 45.1  47.0  45.9   Platelets 150 - 450 x10E3/uL 293  269  346       Latest Ref Rng & Units 11/09/2021    8:44 AM 09/29/2020    3:20 PM 07/03/2020   11:34 AM  CMP  Glucose 70 - 99 mg/dL 115  126  99   BUN 8 - 27 mg/dL '20  16  18   '$ Creatinine 0.57 - 1.00 mg/dL 0.84  1.10  0.95   Sodium 134 - 144 mmol/L 144  144  140   Potassium 3.5 - 5.2 mmol/L 4.7  3.7  3.9   Chloride 96 - 106 mmol/L 104  103  105   CO2 20 - 29 mmol/L '27  20  23   '$ Calcium 8.7 - 10.3 mg/dL 9.2  9.3  9.0   Total Protein 6.0 - 8.5 g/dL 6.2  6.9    Total Bilirubin 0.0 - 1.2 mg/dL 0.4  0.3    Alkaline Phos 44 - 121 IU/L 70  77    AST 0 - 40 IU/L 23  28    ALT 0 - 32 IU/L 17  23       IMAGING:  MRI brain 10/27/20: no acute intracranial abnormality  Imaging independently reviewed on May 24, 2022   Current Outpatient Medications on File Prior to Visit  Medication Sig Dispense Refill   albuterol (VENTOLIN HFA) 108 (90 Base) MCG/ACT inhaler INHALE 1-2 PUFFS BY MOUTH EVERY 6 HOURS AS NEEDED FOR WHEEZE OR SHORTNESS OF BREATH 6.7 each 3   aspirin-acetaminophen-caffeine (EXCEDRIN MIGRAINE) 250-250-65 MG tablet Take 2 tablets by mouth every 8  (eight) hours as needed for migraine.      Azelastine HCl 137 MCG/SPRAY SOLN Place 1 spray into both nostrils at bedtime.     baclofen (LIORESAL) 10 MG tablet Take 1 tablet (10 mg total) by mouth 2 (two) times daily as needed for muscle spasms. 30 tablet 5   buPROPion (WELLBUTRIN XL) 150 MG 24 hr tablet Take 1 tablet (150 mg total) by mouth daily. 90 tablet 0   buPROPion (WELLBUTRIN XL) 300 MG 24 hr tablet Take 1 tablet (300 mg total) by mouth daily. 90 tablet 1   clobetasol cream (TEMOVATE) 4.19 % Apply 1 Application topically 2 (two) times daily. 30 g 0   escitalopram (LEXAPRO) 20 MG tablet Take 1 tablet (20 mg total) by mouth daily. 90 tablet 3   fluticasone (FLONASE) 50 MCG/ACT nasal spray Place 2 sprays into both nostrils daily. 16 g 11   gabapentin (NEURONTIN) 600 MG tablet Take 1 tablet (600 mg total) by mouth 2 (two) times daily. 60 tablet 6   Multiple Vitamin (MULTIVITAMIN WITH MINERALS) TABS tablet Take 1 tablet by mouth daily.     omeprazole (PRILOSEC) 40 MG capsule TAKE 1 CAPSULE (40 MG TOTAL) BY MOUTH DAILY. 90 capsule 2   rizatriptan (MAXALT) 10 MG tablet Take 10 mg by mouth every 2 (two) hours as needed for migraine (max 2 tablets/24 hrs.).   0   topiramate ER (QUDEXY XR) 100 MG CS24 sprinkle capsule Take 1 capsule (100 mg total) by mouth daily. 30 capsule 6   torsemide (DEMADEX) 20 MG tablet Take 2 tablets (40 mg total) by mouth daily. 180 tablet 0   triamcinolone ointment (KENALOG) 0.5 % Apply 1 Application topically 2 (two) times daily. 30 g 0   No current facility-administered medications on file prior to visit.     Allergies: Allergies  Allergen Reactions   Bactrim [Sulfamethoxazole-Trimethoprim] Shortness Of Breath and Swelling   Sulfa Antibiotics Anaphylaxis   Penicillins Hives   Sumatriptan     Made migraine more intense and scalp felt like it was on fire    Family History: Family History  Problem Relation Age of Onset   Hypertension Brother    Diabetes  Brother    Diabetes Other    Hypertension Other    Glaucoma Other    Breast cancer Maternal Aunt        62's  Past Medical History: Past Medical History:  Diagnosis Date   Anxiety    Arthritis    Complication of anesthesia    CAUSES MIGRAINE   Depression    Diabetes (HCC)    Edema of both lower extremities    GERD (gastroesophageal reflux disease)    Headache    Menetrier disease    Migraines    Pre-diabetes    Sleep apnea    CPAP    Past Surgical History Past Surgical History:  Procedure Laterality Date   ANTERIOR CRUCIATE LIGAMENT REPAIR Right 1996   COLONOSCOPY WITH PROPOFOL N/A 02/25/2022   Procedure: COLONOSCOPY WITH PROPOFOL;  Surgeon: [name], MD;  Location: [facility];  Service: Endoscopy;  Laterality: N/A;   DILATION AND CURETTAGE OF UTERUS  2004   FOOT SURGERY Bilateral    JOINT REPLACEMENT Right    KNEE ARTHROPLASTY Right 05/02/2019   Procedure: COMPUTER ASSISTED TOTAL KNEE ARTHROPLASTY;  Surgeon: Dereck Leep, MD;  Location: [facility];  Service: Orthopedics;  Laterality: Right;   KNEE ARTHROPLASTY Left 07/09/2019   Procedure: COMPUTER ASSISTED TOTAL KNEE ARTHROPLASTY;  Surgeon: Dereck Leep, MD;  Location: [facility];  Service: Orthopedics;  Laterality: Left;   KNEE SURGERY Right    MENISCUS REPAIR Bilateral     Social History: Social History   Tobacco Use   Smoking status: Former    Packs/day: 1.00    Years: 15.00    Total pack years: 15.00    Types: Cigarettes    Quit date: 06/21/1999    Years since quitting: 22.9   Smokeless tobacco: Never  Vaping Use   Vaping Use: Never used  Substance Use Topics   Alcohol use: Yes    Alcohol/week: 0.0 standard drinks of alcohol    Comment: occasional   Drug use: No     ROS: Negative for fevers, chills. Positive for headaches. All other systems reviewed and negative unless stated otherwise in HPI.   Physical Exam:   Vital Signs: BP 136/73   Pulse 72   Ht '5\' 3"'$  (1.6 m)   Wt  247 lb (112 kg)   LMP  (LMP Unknown)   BMI 43.75 kg/m  GENERAL: well appearing,in no acute distress,alert SKIN:  Color, texture, turgor normal. No rashes or lesions HEAD:  Normocephalic/atraumatic. CV:  RRR RESP: Normal respiratory effort MSK: +tenderness to palpation over bilateral occiput, neck, and shoulders  NEUROLOGICAL: Mental Status: Alert, oriented to person, place and time,Follows commands Cranial Nerves: PERRL, visual fields intact to confrontation, extraocular movements intact, facial sensation intact, no facial droop or ptosis, hearing grossly intact, no dysarthria Motor: muscle strength 5/5 both upper and lower extremities,no drift, normal tone Reflexes: 2+ throughout Sensation: intact to light touch all 4 extremities Coordination: Finger-to- nose-finger intact bilaterally Gait: normal-based     IMPRESSION: 61 year old female with a history of anxiety, depression, OSA who presents for transfer of care for migraines.  She continues to experience migraine headaches about every other day, somewhat improved since increasing Qudexy dosage.  Continues to work with PT for cervicalgia with benefit.  Does mention short-term memory loss over the past 6 months.   PLAN: -Prevention: Increase Qudexy to 150 mg nightly. Continue gabapentin 600 mg BID.  -Rescue: Continue Excedrin but advised to limit amount, Maxalt 10 mg, baclofen PRN -Discussed obtaining brain MRI for 81-monthonset of memory loss but declines interest at this time due to insurance. B12 and TSH 12/2021 WNL. Suspect more in setting of significant stressors  currently. Discussed importance of managing stress potential benefit with establishing care with behavioral health but will defer to PCP -Discussed importance of nightly CPAP usage as this could be contributing to morning headaches -Continue working with PT for cervicalgia    Follow-up in 4 months or call earlier    I spent 31 minutes of face-to-face and  non-face-to-face time with patient.  This included previsit chart review, lab review, study review, order entry, electronic health record documentation, patient education and discussion regarding above diagnoses and treatment plan and answered questions to patient's satisfaction  Rachael Brewer, [facility]  [facility] [address] [place], [place] [address]  Phone [phone] Fax [phone] Note: This document was prepared with digital dictation and possible smart phrase technology. Any transcriptional errors that result from this process are unintentional.

## 2022-05-20 NOTE — Telephone Encounter (Signed)
Pt request refill for topiramate ER (QUDEXY XR) 100 MG CS24 sprinkle capsule at  [id number] Pt said Pharmacy requesting a PA

## 2022-05-20 NOTE — Telephone Encounter (Signed)
Pt states she needs a PA for omeprazole (PRILOSEC) 40 MG capsule   Please assist further

## 2022-05-20 NOTE — Telephone Encounter (Signed)
Requires PA- routed to office.

## 2022-05-20 NOTE — Telephone Encounter (Signed)
Pt has enough refills on file. It was sent in aug with 6 refills. Will work on completing a [place]

## 2022-05-20 NOTE — Telephone Encounter (Signed)
PA completed on CMM/[employer] [id number] Will await determination

## 2022-05-24 ENCOUNTER — Ambulatory Visit: Payer: 59 | Admitting: Adult Health

## 2022-05-24 ENCOUNTER — Encounter: Payer: Self-pay | Admitting: Adult Health

## 2022-05-24 VITALS — BP 136/73 | HR 72 | Ht 63.0 in | Wt 247.0 lb

## 2022-05-24 DIAGNOSIS — R413 Other amnesia: Secondary | ICD-10-CM

## 2022-05-24 DIAGNOSIS — M542 Cervicalgia: Secondary | ICD-10-CM

## 2022-05-24 DIAGNOSIS — G43019 Migraine without aura, intractable, without status migrainosus: Secondary | ICD-10-CM | POA: Diagnosis not present

## 2022-05-24 MED ORDER — TOPIRAMATE ER 150 MG PO SPRINKLE CAP24: 150.0000 mg | EXTENDED_RELEASE_CAPSULE | Freq: Every day | ORAL | 5 refills | Status: DC

## 2022-05-24 NOTE — Patient Instructions (Addendum)
Increase dose of Quedexy to '150mg'$  nightly for migraine prevention if headaches persist  - please monitor for any worsening of memory as increasing dosage could affect your memory   Continue use of rizatriptan as needed  Please limit use of Excedrin as this can cause rebound headaches   Please restart using CPAP nightly   Please try to manage stress levels as this could be contributing to your memory concerns Doing things like cross word puzzles, word search and sodoku as this can help memory. Also ensuring good sleep, routine exercise and healthy diet can be helpful.  Can consider obtaining an MRI brain if symptoms persist      Follow up in 4 months or call earlier if needed

## 2022-05-27 ENCOUNTER — Other Ambulatory Visit: Payer: Self-pay | Admitting: Family Medicine

## 2022-05-27 ENCOUNTER — Telehealth: Payer: Self-pay | Admitting: Family Medicine

## 2022-05-27 MED ORDER — ALBUTEROL SULFATE HFA 108 (90 BASE) MCG/ACT IN AERS: INHALATION_SPRAY | RESPIRATORY_TRACT | 3 refills | Status: DC

## 2022-05-27 NOTE — Telephone Encounter (Signed)
Pt calling to follow up on PA for medication omeprazole (PRILOSEC) 40 MG capsule.  Please advise.

## 2022-05-27 NOTE — Telephone Encounter (Signed)
Pts insurance only covers 90 caps of Omeprazole in 365 days / pt needs a PA for omeprazole (PRILOSEC) 40 MG capsule / please advise [facility] when done / they stated the pta insurance does not advised them of PA outcome

## 2022-05-27 NOTE — Telephone Encounter (Signed)
Medication Refill - Medication: albuterol (VENTOLIN HFA) 108 (90 Base) MCG/ACT inhaler   Has the patient contacted their pharmacy? Yes.   No, more refills.   (Agent: If yes, when and what did the pharmacy advise?)  Preferred Pharmacy (with phone number or street name):  [id number] [place] [address] Phone: [phone]: [phone] Hours: Not open 24 hours   Has the patient been seen for an appointment in the last year OR does the patient have an upcoming appointment? Yes.    Agent: Please be advised that RX refills may take up to 3 business days. We ask that you follow-up with your pharmacy.

## 2022-05-29 ENCOUNTER — Other Ambulatory Visit: Payer: Self-pay | Admitting: Family Medicine

## 2022-05-29 DIAGNOSIS — R601 Generalized edema: Secondary | ICD-10-CM

## 2022-05-31 NOTE — Telephone Encounter (Signed)
Recommend she buys generic OTC.  They are unlikely to cover any PPIs if they do not cover omeprazole

## 2022-05-31 NOTE — Telephone Encounter (Signed)
Patient advised.

## 2022-05-31 NOTE — Telephone Encounter (Signed)
Pt called in requesting an alternative to omeprazole because her insurance will not cover it even with a prior auth. Pt needs an alternative.

## 2022-06-01 NOTE — Telephone Encounter (Signed)
PA denied for the 100 mg dose.  A 150 mg dose was initiated on CMM/[employer] KEY: [id number] Will await determination

## 2022-06-02 ENCOUNTER — Encounter: Payer: Self-pay | Admitting: Neurology

## 2022-06-02 NOTE — Telephone Encounter (Signed)
PA was denied for the 150 mg dose for the pt. I have submitted an appeal for the patient. Appeal faxed and received confirmation that it went through.

## 2022-06-11 NOTE — Telephone Encounter (Signed)
Patient called in states insurance isnt covering Omeprazole and she is looking for an alternative. I gave there the info about insurance not covering PPI's, so does this mean she has to find something OTC?

## 2022-06-15 NOTE — Telephone Encounter (Signed)
Left detailed message advising of buying generic OTC.

## 2022-06-16 ENCOUNTER — Ambulatory Visit: Payer: 59 | Admitting: Podiatry

## 2022-06-22 ENCOUNTER — Ambulatory Visit (INDEPENDENT_AMBULATORY_CARE_PROVIDER_SITE_OTHER): Payer: 59

## 2022-06-22 ENCOUNTER — Ambulatory Visit: Payer: 59 | Admitting: Podiatry

## 2022-06-22 VITALS — BP 175/75

## 2022-06-22 DIAGNOSIS — S92504D Nondisplaced unspecified fracture of right lesser toe(s), subsequent encounter for fracture with routine healing: Secondary | ICD-10-CM

## 2022-06-22 NOTE — Progress Notes (Signed)
She presents today for follow-up of her fracture fifth metatarsal of her right foot.  States that it feels better and would like to return to normal activities  Objective: Vital signs stable alert oriented x 3 there is no erythema edema cellulitis drainage or odor she still has tenderness on palpation of her right fifth metatarsal.  Radiographs taken today demonstrate a minimally displaced oblique fracture of the fifth metatarsal right foot..  There is bone callus development.  Assessment: Bone callus with fracture fifth met right normal healing.  Plan: Clinically healed and officially discharged from my care.  If any foot and ankle issues on future she will come back and see me.  She can return to normal activities.

## 2022-06-24 ENCOUNTER — Ambulatory Visit (INDEPENDENT_AMBULATORY_CARE_PROVIDER_SITE_OTHER): Payer: 59 | Admitting: Family Medicine

## 2022-06-24 ENCOUNTER — Encounter: Payer: Self-pay | Admitting: Family Medicine

## 2022-06-24 VITALS — BP 125/72 | HR 71 | Temp 98.7°F | Resp 16 | Wt 252.0 lb

## 2022-06-24 DIAGNOSIS — M19049 Primary osteoarthritis, unspecified hand: Secondary | ICD-10-CM | POA: Diagnosis not present

## 2022-06-24 DIAGNOSIS — F339 Major depressive disorder, recurrent, unspecified: Secondary | ICD-10-CM

## 2022-06-24 DIAGNOSIS — R69 Illness, unspecified: Secondary | ICD-10-CM | POA: Diagnosis not present

## 2022-06-24 DIAGNOSIS — F529 Unspecified sexual dysfunction not due to a substance or known physiological condition: Secondary | ICD-10-CM

## 2022-06-24 MED ORDER — SERTRALINE HCL 100 MG PO TABS: 100.0000 mg | ORAL_TABLET | Freq: Every day | ORAL | 3 refills | Status: DC

## 2022-06-24 NOTE — Progress Notes (Signed)
I,Sulibeya S Dimas,acting as a scribe for Lavon Paganini, MD.,have documented all relevant documentation on the behalf of Lavon Paganini, MD,as directed by  Lavon Paganini, MD while in the presence of Lavon Paganini, MD.     Established patient visit   Patient: Rachael Brewer   DOB: 1961-02-18   62 y.o. Female  MRN: [id number] Visit Date: 06/24/2022  Today's healthcare provider: Lavon Paganini, MD   Chief Complaint  Patient presents with   Sexual Problem   Subjective    HPI  Patient reports she has been "sexually aroused" on and off for several days. She reports "orgasms" frequently. Patient C/O for persistent genital arousal disorder.  Medications: Outpatient Medications Prior to Visit  Medication Sig   albuterol (VENTOLIN HFA) 108 (90 Base) MCG/ACT inhaler INHALE 1-2 PUFFS BY MOUTH EVERY 6 HOURS AS NEEDED FOR WHEEZE OR SHORTNESS OF BREATH   aspirin-acetaminophen-caffeine (EXCEDRIN MIGRAINE) 250-250-65 MG tablet Take 2 tablets by mouth every 8 (eight) hours as needed for migraine.    Azelastine HCl 137 MCG/SPRAY SOLN Place 1 spray into both nostrils at bedtime.   baclofen (LIORESAL) 10 MG tablet Take 1 tablet (10 mg total) by mouth 2 (two) times daily as needed for muscle spasms.   buPROPion (WELLBUTRIN XL) 150 MG 24 hr tablet Take 1 tablet (150 mg total) by mouth daily.   buPROPion (WELLBUTRIN XL) 300 MG 24 hr tablet TAKE 1 TABLET BY MOUTH EVERY DAY   clobetasol cream (TEMOVATE) 8.41 % Apply 1 Application topically 2 (two) times daily.   fluticasone (FLONASE) 50 MCG/ACT nasal spray Place 2 sprays into both nostrils daily.   gabapentin (NEURONTIN) 600 MG tablet Take 1 tablet (600 mg total) by mouth 2 (two) times daily.   Multiple Vitamin (MULTIVITAMIN WITH MINERALS) TABS tablet Take 1 tablet by mouth daily.   omeprazole (PRILOSEC) 40 MG capsule TAKE 1 CAPSULE (40 MG TOTAL) BY MOUTH DAILY.   rizatriptan (MAXALT) 10 MG tablet Take 10 mg by mouth every 2 (two)  hours as needed for migraine (max 2 tablets/24 hrs.).    topiramate ER (QUDEXY XR) 150 MG CS24 sprinkle capsule Take 1 capsule (150 mg total) by mouth daily.   torsemide (DEMADEX) 20 MG tablet TAKE 2 TABLETS (40 MG TOTAL) BY MOUTH DAILY.   triamcinolone ointment (KENALOG) 0.5 % Apply 1 Application topically 2 (two) times daily.   [DISCONTINUED] escitalopram (LEXAPRO) 20 MG tablet Take 1 tablet (20 mg total) by mouth daily.   No facility-administered medications prior to visit.    Review of Systems per HPI     Objective    BP 125/72 (BP Location: Left Arm, Patient Position: Sitting, Cuff Size: Large)   Pulse 71   Temp 98.7 F (37.1 C) (Oral)   Resp 16   Wt 252 lb (114.3 kg)   LMP  (LMP Unknown)   BMI 44.64 kg/m  BP Readings from Last 3 Encounters:  06/24/22 125/72  06/22/22 (!) 175/75  05/24/22 136/73   Wt Readings from Last 3 Encounters:  06/24/22 252 lb (114.3 kg)  05/24/22 247 lb (112 kg)  05/18/22 251 lb (113.9 kg)      Physical Exam Vitals reviewed.  Constitutional:      General: She is not in acute distress.    Appearance: She is well-developed.  HENT:     Head: Normocephalic and atraumatic.  Eyes:     General: No scleral icterus.    Conjunctiva/sclera: Conjunctivae normal.  Cardiovascular:     Rate and Rhythm:  Normal rate and regular rhythm.  Pulmonary:     Effort: Pulmonary effort is normal. No respiratory distress.  Skin:    General: Skin is warm and dry.     Findings: No rash.  Neurological:     Mental Status: She is alert and oriented to person, place, and time.  Psychiatric:        Behavior: Behavior normal.       No results found for any visits on 06/24/22.  Assessment & Plan     Problem List Items Addressed This Visit       Musculoskeletal and Integument   Hand arthritis    Ongoing, intermittent issue Trial of using Voltaren regularly Discussed using NSAIDs If not improving, could consider referral to Ortho hand for possible  injections        Other   Depression, recurrent (HCC)    Longstanding Has been fairly well-controlled Continue Wellbutrin at current dose As below, will switch Lexapro to Zoloft at similar dose      Relevant Medications   sertraline (ZOLOFT) 100 MG tablet   Sexual arousal disorder - Primary    Patient does meet several of the criteria for persistent genital arousal disorder She has long-lasting arousal with little to no improvement after orgasm in the absence of sexual desire She has had no medication changes and is on no hormone therapy Discussed possible use of CBT to help Discussed that SSRIs can be used to treat this Lexapro is touted to have less sexual side effects, so we will try a different SSRI Will switch from Lexapro 20 mg daily to Zoloft 100 mg daily Can consider further dose titration if needed No other med changes today Consider referral to gynecology if not improving        No follow-ups on file.      I, Lavon Paganini, MD, have reviewed all documentation for this visit. The documentation on 06/24/22 for the exam, diagnosis, procedures, and orders are all accurate and complete.   Andres Vest, Dionne Bucy, MD, MPH [facility]

## 2022-06-24 NOTE — Assessment & Plan Note (Signed)
Patient does meet several of the criteria for persistent genital arousal disorder She has long-lasting arousal with little to no improvement after orgasm in the absence of sexual desire She has had no medication changes and is on no hormone therapy Discussed possible use of CBT to help Discussed that SSRIs can be used to treat this Lexapro is touted to have less sexual side effects, so we will try a different SSRI Will switch from Lexapro 20 mg daily to Zoloft 100 mg daily Can consider further dose titration if needed No other med changes today Consider referral to gynecology if not improving

## 2022-06-24 NOTE — Assessment & Plan Note (Signed)
Longstanding Has been fairly well-controlled Continue Wellbutrin at current dose As below, will switch Lexapro to Zoloft at similar dose

## 2022-06-24 NOTE — Assessment & Plan Note (Signed)
Ongoing, intermittent issue Trial of using Voltaren regularly Discussed using NSAIDs If not improving, could consider referral to Ortho hand for possible injections

## 2022-06-27 IMAGING — MG MM DIGITAL SCREENING BILAT W/ TOMO AND CAD
8 series · 8 of 24 positions shown · non-contrast
Comparison: Previous exam(s).

CLINICAL DATA: Screening.

EXAM:
DIGITAL SCREENING BILATERAL MAMMOGRAM WITH TOMOSYNTHESIS AND CAD
TECHNIQUE: Bilateral screening digital craniocaudal and mediolateral oblique
mammograms were obtained. Bilateral screening digital breast
tomosynthesis was performed. The images were evaluated with
computer-aided detection.

[L MLO synth-2D]
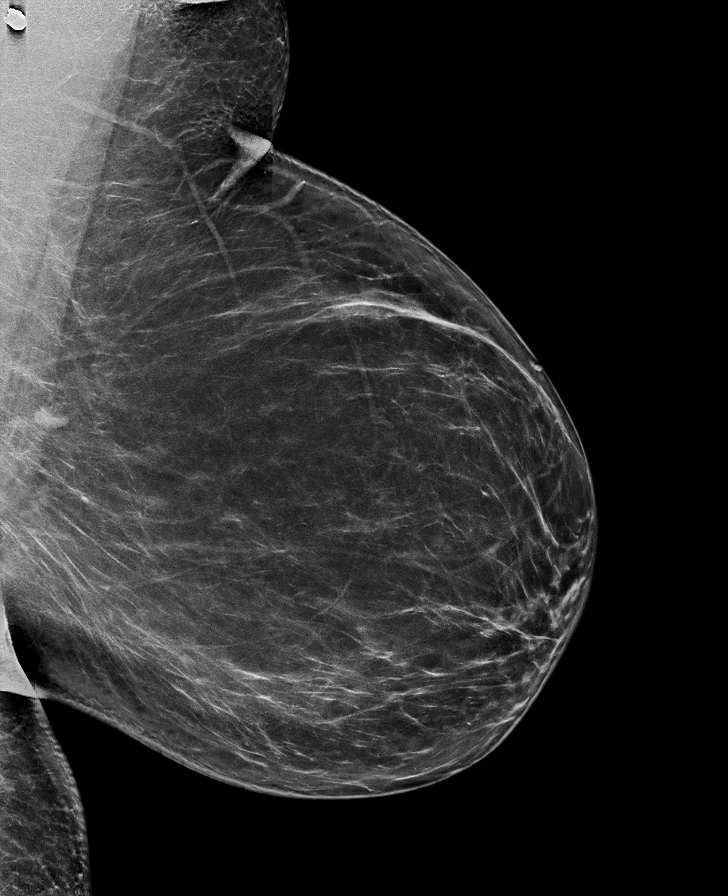

[R CC synth-2D]
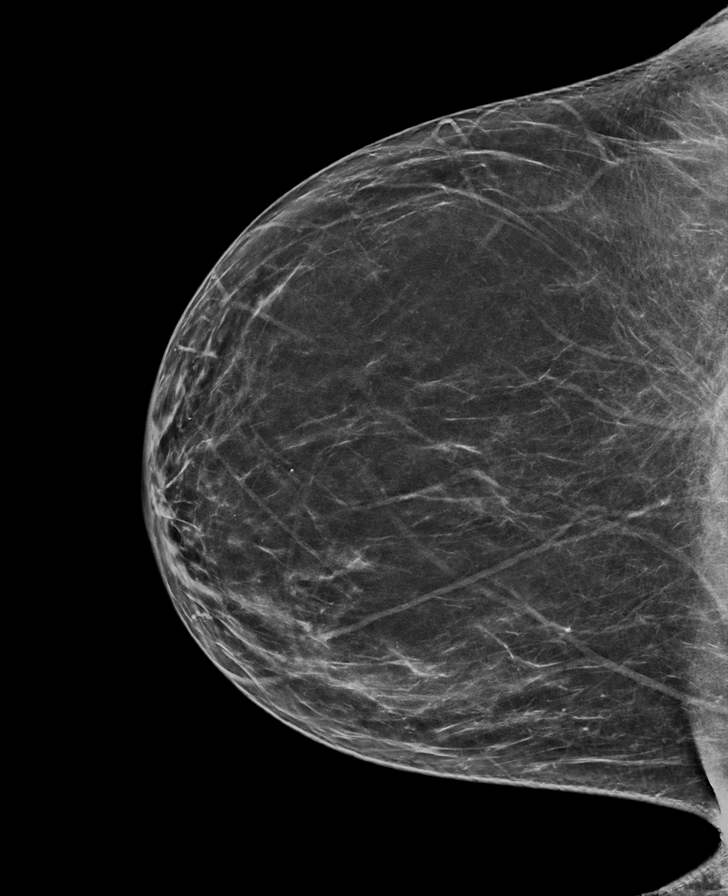

[L CC synth-2D]
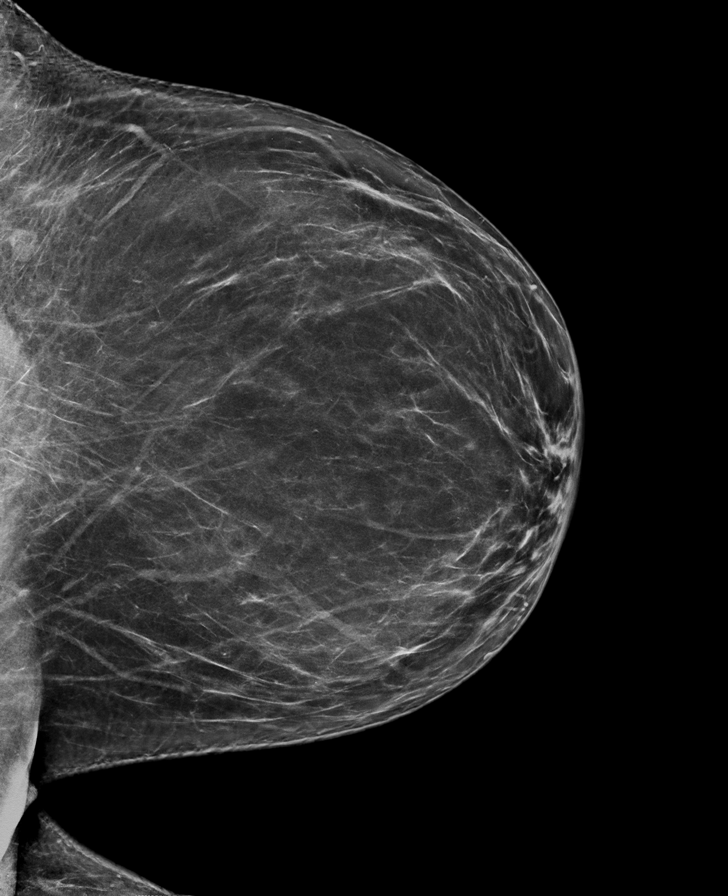

[R MLO synth-2D]
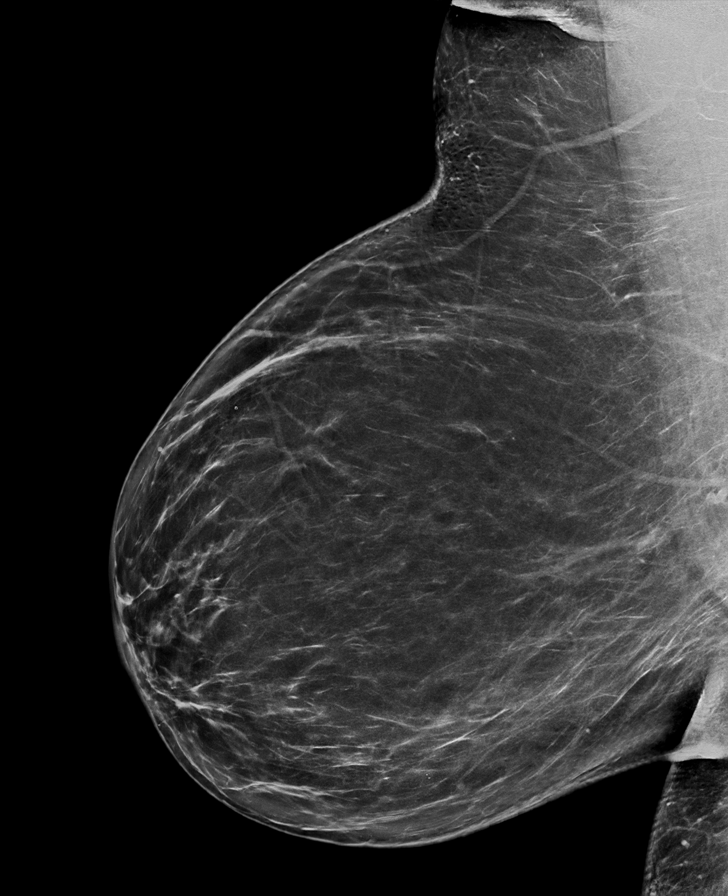

[R CC tomo · tomo slice 39/76.0]
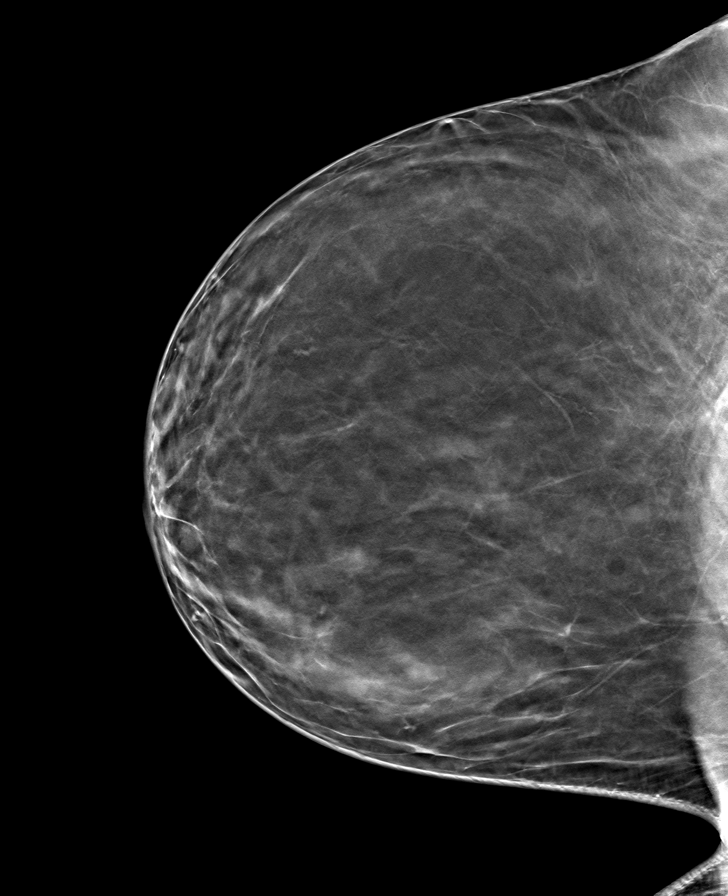

[L MLO tomo · tomo slice 43/85.0]
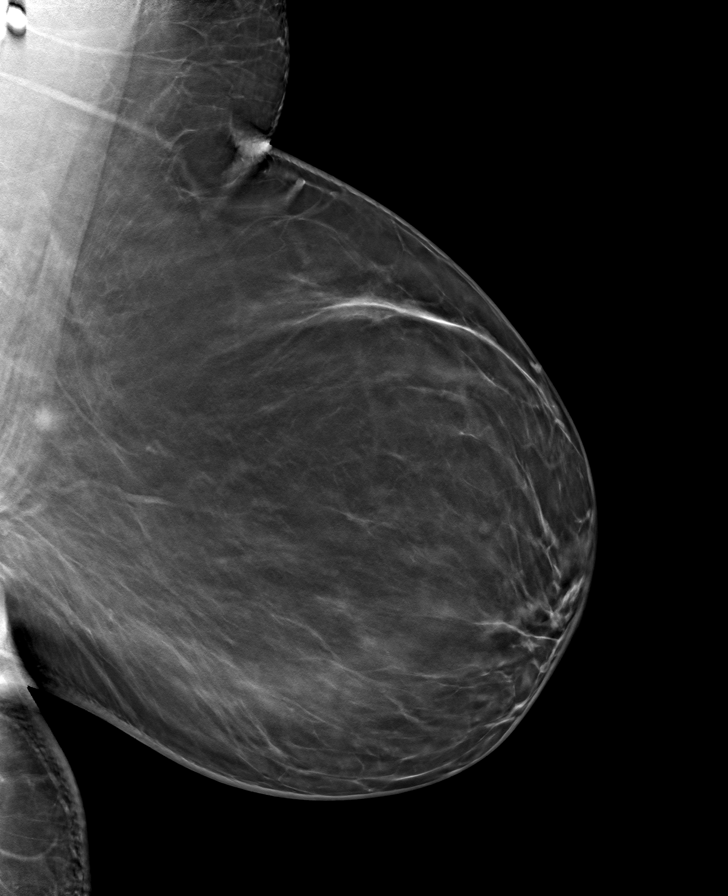

[L CC tomo · tomo slice 41/80.0]
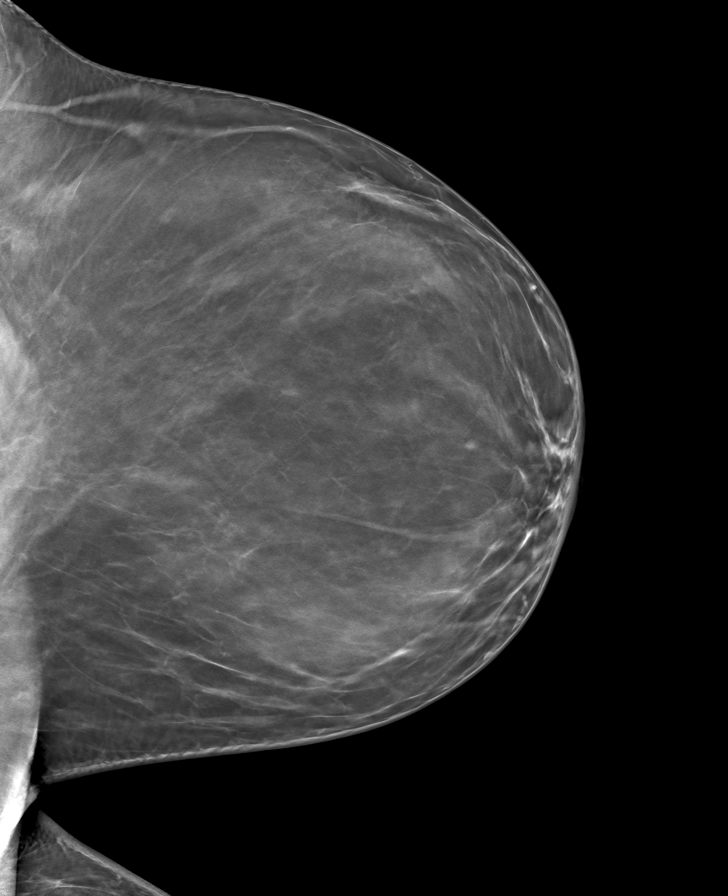

[R MLO tomo · tomo slice 43/85.0]
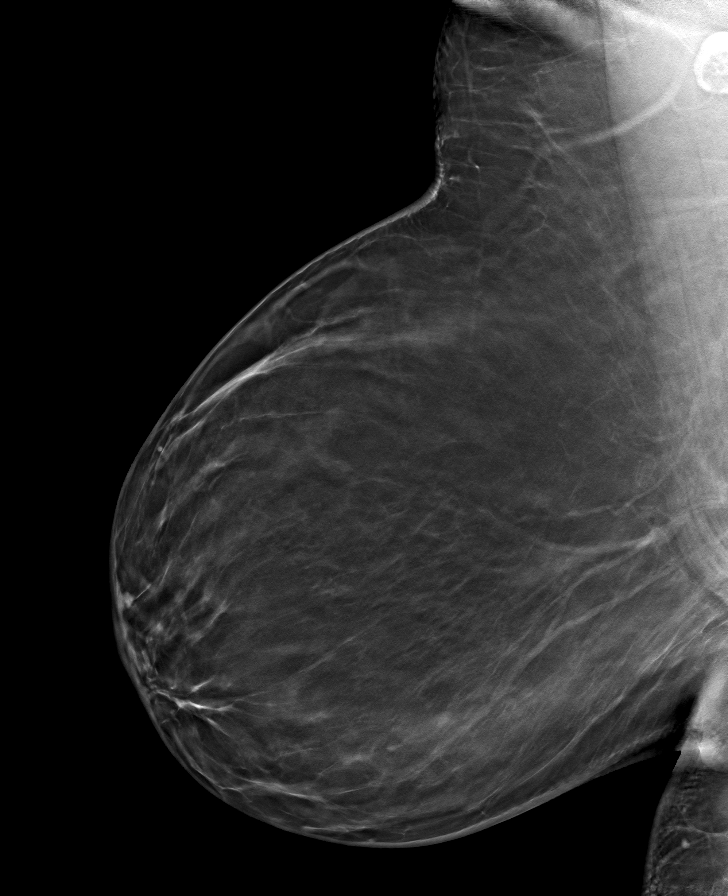

[8 of 24 positions shown; findings below may reference images not displayed]

ACR Breast Density Category b: There are scattered areas of
fibroglandular density.
FINDINGS: There are no findings suspicious for malignancy. The images were
evaluated with computer-aided detection.
IMPRESSION: No mammographic evidence of malignancy. A result letter of this
screening mammogram will be mailed directly to the patient.

RECOMMENDATION:
Screening mammogram in one year. (Code:[id number]-[id number])

BI-RADS CATEGORY  1: Negative.

## 2022-07-13 ENCOUNTER — Other Ambulatory Visit: Payer: Self-pay | Admitting: Podiatry

## 2022-07-13 DIAGNOSIS — S92504D Nondisplaced unspecified fracture of right lesser toe(s), subsequent encounter for fracture with routine healing: Secondary | ICD-10-CM

## 2022-07-14 ENCOUNTER — Ambulatory Visit (INDEPENDENT_AMBULATORY_CARE_PROVIDER_SITE_OTHER): Payer: 59

## 2022-07-14 ENCOUNTER — Encounter: Payer: Self-pay | Admitting: Podiatry

## 2022-07-14 ENCOUNTER — Ambulatory Visit: Payer: 59 | Admitting: Podiatry

## 2022-07-14 DIAGNOSIS — S92504D Nondisplaced unspecified fracture of right lesser toe(s), subsequent encounter for fracture with routine healing: Secondary | ICD-10-CM | POA: Diagnosis not present

## 2022-07-14 NOTE — Progress Notes (Signed)
She presents today for follow-up of her fracture fifth met right.  States that her foot started hurting again and now the little toe hurts also.  She has been using her Darco shoe wedge that Dr. Posey Pronto have provided her.  Objective: Vital signs stable alert oriented x 3.  Pulses are palpable.  There is minimal edema no erythema cellulitis drainage or odor no ecchymosis.  Radiographs taken today demonstrate resolving bone callus but fracture is still visible but appears to be healing.  She does have some tenderness on palpation of the fifth metatarsal and the fifth toe no new fractures are identified on radiograph.  Assessment: Well-healing fracture fifth met right.  Plan: Recommended that she get back into the cam boot in the flat Darco shoe or a sturdy tennis shoe.  She is not to walk barefoot in the house or at any other time.  Follow-up with her in about 2 weeks for another set of x-rays.

## 2022-08-18 ENCOUNTER — Ambulatory Visit: Payer: 59 | Admitting: Podiatry

## 2022-08-26 ENCOUNTER — Other Ambulatory Visit: Payer: Self-pay | Admitting: Psychiatry

## 2022-08-26 ENCOUNTER — Other Ambulatory Visit: Payer: Self-pay | Admitting: Family Medicine

## 2022-08-26 DIAGNOSIS — R5383 Other fatigue: Secondary | ICD-10-CM

## 2022-09-03 ENCOUNTER — Other Ambulatory Visit: Payer: Self-pay | Admitting: Family Medicine

## 2022-09-08 ENCOUNTER — Ambulatory Visit: Payer: 59 | Admitting: Podiatry

## 2022-09-20 ENCOUNTER — Telehealth: Payer: Self-pay | Admitting: Adult Health

## 2022-09-20 NOTE — Telephone Encounter (Signed)
LVM and sent mychart msg informing pt of need to reschedule 10/04/22 appointment - NP out  

## 2022-09-29 ENCOUNTER — Other Ambulatory Visit: Payer: Self-pay | Admitting: Psychiatry

## 2022-09-29 NOTE — Progress Notes (Unsigned)
Established patient visit   Patient: Rachael Brewer   DOB: [birth year]   62 y.o. Female  MRN: [id number] Visit Date: 09/30/2022  Today's healthcare provider: Shirlee Latch, MD   No chief complaint on file.  Subjective    HPI  Depression, Follow-up  She  was last seen for this 3 months ago. Changes made at last visit include will switch Lexapro to Zoloft 100 mg daily.   She reports {excellent/good/fair/poor:19665} compliance with treatment. She {is/is not:21021397} having side effects. ***  She reports {DESC; GOOD/FAIR/POOR:18685} tolerance of treatment. Current symptoms include: {Symptoms; depression:1002} She feels she is {improved/worse/unchanged:3041574} since last visit.     06/24/2022    3:02 PM 03/29/2022    1:13 PM 01/13/2022    9:12 AM  Depression screen PHQ 2/9  Decreased Interest  3 2  Down, Depressed, Hopeless 1 2 1   PHQ - 2 Score 1 5 3   Altered sleeping 0 1 1  Tired, decreased energy 3 2 3   Change in appetite 2 3 2   Feeling bad or failure about yourself  1 1 3   Trouble concentrating 0 0 0  Moving slowly or fidgety/restless 0 0 0  Suicidal thoughts 0 0 0  PHQ-9 Score 7 12 12   Difficult doing work/chores Very difficult Very difficult     -----------------------------------------------------------------------------------------   Medications: Outpatient Medications Prior to Visit  Medication Sig   albuterol (VENTOLIN HFA) 108 (90 Base) MCG/ACT inhaler INHALE 1-2 PUFFS BY MOUTH EVERY 6 HOURS AS NEEDED FOR WHEEZE OR SHORTNESS OF BREATH   aspirin-acetaminophen-caffeine (EXCEDRIN MIGRAINE) 250-250-65 MG tablet Take 2 tablets by mouth every 8 (eight) hours as needed for migraine.    Azelastine HCl 137 MCG/SPRAY SOLN Place 1 spray into both nostrils at bedtime.   baclofen (LIORESAL) 10 MG tablet TAKE 1 TABLET BY MOUTH TWICE A DAY AS NEEDED FOR MUSCLE SPASMS   buPROPion (WELLBUTRIN XL) 150 MG 24 hr tablet TAKE 1 TABLET BY MOUTH EVERY DAY   buPROPion  (WELLBUTRIN XL) 300 MG 24 hr tablet TAKE 1 TABLET BY MOUTH EVERY DAY   clobetasol cream (TEMOVATE) 0.05 % Apply 1 Application topically 2 (two) times daily.   fluticasone (FLONASE) 50 MCG/ACT nasal spray Place 2 sprays into both nostrils daily.   gabapentin (NEURONTIN) 600 MG tablet Take 1 tablet (600 mg total) by mouth 2 (two) times daily.   Multiple Vitamin (MULTIVITAMIN WITH MINERALS) TABS tablet Take 1 tablet by mouth daily.   omeprazole (PRILOSEC) 40 MG capsule TAKE 1 CAPSULE (40 MG TOTAL) BY MOUTH DAILY.   rizatriptan (MAXALT) 10 MG tablet Take 10 mg by mouth every 2 (two) hours as needed for migraine (max 2 tablets/24 hrs.).    sertraline (ZOLOFT) 100 MG tablet Take 1 tablet (100 mg total) by mouth daily.   topiramate ER (QUDEXY XR) 150 MG CS24 sprinkle capsule Take 1 capsule (150 mg total) by mouth daily.   torsemide (DEMADEX) 20 MG tablet TAKE 2 TABLETS (40 MG TOTAL) BY MOUTH DAILY.   triamcinolone ointment (KENALOG) 0.5 % Apply 1 Application topically 2 (two) times daily.   No facility-administered medications prior to visit.    Review of Systems  {Labs  Heme  Chem  Endocrine  Serology  Results Review (optional):23779}   Objective    LMP  (LMP Unknown)  {Show previous vital signs (optional):23777}  Physical Exam  ***  No results found for any visits on 09/30/22.  Assessment & Plan     ***  No  follow-ups on file.      {provider attestation***:1}   Shirlee Latch, MD  [facility] [phone] (phone) [phone] (fax)  [facility]

## 2022-09-30 ENCOUNTER — Ambulatory Visit (INDEPENDENT_AMBULATORY_CARE_PROVIDER_SITE_OTHER): Payer: 59 | Admitting: Family Medicine

## 2022-09-30 ENCOUNTER — Encounter: Payer: Self-pay | Admitting: Family Medicine

## 2022-09-30 VITALS — BP 105/67 | HR 78 | Temp 97.8°F | Resp 12 | Wt 252.6 lb

## 2022-09-30 DIAGNOSIS — F529 Unspecified sexual dysfunction not due to a substance or known physiological condition: Secondary | ICD-10-CM

## 2022-09-30 DIAGNOSIS — F339 Major depressive disorder, recurrent, unspecified: Secondary | ICD-10-CM

## 2022-09-30 NOTE — Telephone Encounter (Signed)
Last seen on 05/24/22 Follow up scheduled on 10/26/22 Last filled on 09/08/22 # 30 tablets (15 day supply)

## 2022-09-30 NOTE — Progress Notes (Signed)
Established patient visit   Patient: Rachael Brewer   DOB: Dec 19, 1960   62 y.o. Female  MRN: [id number] Visit Date: 09/30/2022  Today's healthcare provider: Shirlee Latch, MD   Chief Complaint  Patient presents with   Depression   Subjective    Depression        Associated symptoms include fatigue and appetite change.  Associated symptoms include no headaches.   Depression, Follow-up  She  was last seen for this 3 months ago. Changes made at last visit include will switch Lexapro to Zoloft 100 mg daily.   She reports excellent compliance with treatment. She is not having side effects.   She reports excellent tolerance of treatment. Current symptoms include: depressed mood, fatigue, feelings of worthlessness/guilt, hopelessness, and insomnia She feels she is Improved since last visit.  Has started at home family therapy and counseling with her son through [place]. Started grief counseling for the loss of her husband 1 year ago.   Rash Noted a localized rash in the upper left chest that overlaps with where her seatbelt cover sits while driving.  Cannot recalled the last time the cover was washed.          09/30/2022   10:45 AM 06/24/2022    3:02 PM 03/29/2022    1:13 PM  Depression screen PHQ 2/9  Decreased Interest 2  3  Down, Depressed, Hopeless 1 1 2   PHQ - 2 Score 3 1 5   Altered sleeping 1 0 1  Tired, decreased energy 1 3 2   Change in appetite 3 2 3   Feeling bad or failure about yourself  1 1 1   Trouble concentrating 0 0 0  Moving slowly or fidgety/restless 0 0 0  Suicidal thoughts 0 0 0  PHQ-9 Score 9 7 12   Difficult doing work/chores Somewhat difficult Very difficult Very difficult    -----------------------------------------------------------------------------------------   Medications: Outpatient Medications Prior to Visit  Medication Sig   albuterol (VENTOLIN HFA) 108 (90 Base) MCG/ACT inhaler INHALE 1-2 PUFFS BY MOUTH EVERY 6 HOURS AS  NEEDED FOR WHEEZE OR SHORTNESS OF BREATH   aspirin-acetaminophen-caffeine (EXCEDRIN MIGRAINE) 250-250-65 MG tablet Take 2 tablets by mouth every 8 (eight) hours as needed for migraine.    Azelastine HCl 137 MCG/SPRAY SOLN Place 1 spray into both nostrils at bedtime.   baclofen (LIORESAL) 10 MG tablet TAKE 1 TABLET BY MOUTH TWICE A DAY AS NEEDED FOR MUSCLE SPASM   buPROPion (WELLBUTRIN XL) 150 MG 24 hr tablet TAKE 1 TABLET BY MOUTH EVERY DAY   buPROPion (WELLBUTRIN XL) 300 MG 24 hr tablet TAKE 1 TABLET BY MOUTH EVERY DAY   clobetasol cream (TEMOVATE) 0.05 % Apply 1 Application topically 2 (two) times daily.   fluticasone (FLONASE) 50 MCG/ACT nasal spray Place 2 sprays into both nostrils daily.   gabapentin (NEURONTIN) 600 MG tablet Take 1 tablet (600 mg total) by mouth 2 (two) times daily.   Multiple Vitamin (MULTIVITAMIN WITH MINERALS) TABS tablet Take 1 tablet by mouth daily.   omeprazole (PRILOSEC) 40 MG capsule TAKE 1 CAPSULE (40 MG TOTAL) BY MOUTH DAILY.   rizatriptan (MAXALT) 10 MG tablet Take 10 mg by mouth every 2 (two) hours as needed for migraine (max 2 tablets/24 hrs.).    sertraline (ZOLOFT) 100 MG tablet Take 1 tablet (100 mg total) by mouth daily.   topiramate ER (QUDEXY XR) 150 MG CS24 sprinkle capsule Take 1 capsule (150 mg total) by mouth daily.   torsemide (DEMADEX) 20  MG tablet TAKE 2 TABLETS (40 MG TOTAL) BY MOUTH DAILY.   triamcinolone ointment (KENALOG) 0.5 % Apply 1 Application topically 2 (two) times daily.   No facility-administered medications prior to visit.    Review of Systems  Constitutional:  Positive for appetite change and fatigue.  Respiratory:  Positive for shortness of breath.   Skin:  Positive for rash.       No itching or pain.  Neurological:  Negative for tremors and headaches.  Psychiatric/Behavioral:  Positive for depression, dysphoric mood and sleep disturbance. The patient is nervous/anxious.        Objective    BP 105/67 (BP Location: Left  Arm, Patient Position: Sitting, Cuff Size: Large)   Pulse 78   Temp 97.8 F (36.6 C) (Temporal)   Resp 12   Wt 252 lb 9.6 oz (114.6 kg)   LMP  (LMP Unknown)   SpO2 95%   BMI 44.75 kg/m  BP Readings from Last 3 Encounters:  09/30/22 105/67  06/24/22 125/72  06/22/22 (!) 175/75   Wt Readings from Last 3 Encounters:  09/30/22 252 lb 9.6 oz (114.6 kg)  06/24/22 252 lb (114.3 kg)  05/24/22 247 lb (112 kg)      Physical Exam Constitutional:      General: She is not in acute distress.    Appearance: Normal appearance.  Pulmonary:     Effort: Pulmonary effort is normal.  Musculoskeletal:     Cervical back: Normal range of motion.  Skin:    General: Skin is warm and dry.     Findings: Rash present. Rash is macular and papular.     Comments: Rash located on the upper left chest, lateral to the neck.   Neurological:     Mental Status: She is alert.       No results found for any visits on 09/30/22.  Assessment & Plan   1. Depression, recurrent 2. Sexual arousal disorder Chronic and improving.  Started family therapy and grief counseling.  Continue Sertraline 100 mg daily.   3. Rash Symptoms and presentation c/w contact dermatitis. Advised to wash or change seatbelt cover frequently. Advised to return if rash does not improve or worsens.    Return in about 3 months (around 12/30/2022) for CPE.      Ezekiel Slocumb, MS3 [employer] [employer] [facility] [facility]   Patient seen along with MS3 student Ezekiel Slocumb. I personally evaluated this patient along with the student, and verified all aspects of the history, physical exam, and medical decision making as documented by the student. I agree with the student's documentation and have made all necessary edits.  Daquavion Catala, Marzella Schlein, MD, MPH [facility]

## 2022-09-30 NOTE — Assessment & Plan Note (Signed)
Chronic and improving.  Started family therapy and grief counseling.  Continue sertraline 100 mg daily.

## 2022-09-30 NOTE — Assessment & Plan Note (Signed)
Chronic and improving.  Started family therapy and grief counseling.  Continue sertraline 100 mg daily. 

## 2022-10-01 ENCOUNTER — Other Ambulatory Visit: Payer: Self-pay | Admitting: Psychiatry

## 2022-10-04 ENCOUNTER — Ambulatory Visit: Payer: 59 | Admitting: Adult Health

## 2022-10-05 ENCOUNTER — Other Ambulatory Visit: Payer: Self-pay

## 2022-10-05 MED ORDER — GABAPENTIN 600 MG PO TABS: 600.0000 mg | ORAL_TABLET | Freq: Two times a day (BID) | ORAL | 0 refills | Status: DC

## 2022-10-11 ENCOUNTER — Ambulatory Visit: Payer: 59 | Admitting: Podiatry

## 2022-10-19 ENCOUNTER — Other Ambulatory Visit: Payer: Self-pay | Admitting: Psychiatry

## 2022-10-20 ENCOUNTER — Telehealth: Payer: Self-pay

## 2022-10-20 NOTE — Telephone Encounter (Signed)
Pt last seen on 05/24/22 per note "Rescue: Continue Excedrin but advised to limit amount, Maxalt 10 mg, baclofen PRN "  Follow up scheduled on 10/26/22 Last filled on 09/30/22 #30 tablets (15 day supply)

## 2022-10-20 NOTE — Telephone Encounter (Signed)
..   Medicaid Managed Care   Unsuccessful Outreach Note  10/20/2022 Name: Rachael Brewer MRN: [id number] DOB: [birth year]  Referred by: Erasmo Downer, MD Reason for referral : Appointment   An unsuccessful telephone outreach was attempted today. The patient was referred to the case management team for assistance with care management and care coordination.   Follow Up Plan: A HIPAA compliant phone message was left for the patient providing contact information and requesting a return call.  The care management team will reach out to the patient again over the next 7-14 days.   Weston Settle Care Guide  [facility] Managed  Care Guide [facility]  [phone]

## 2022-10-26 ENCOUNTER — Encounter: Payer: Self-pay | Admitting: Adult Health

## 2022-10-26 ENCOUNTER — Ambulatory Visit: Payer: 59 | Admitting: Adult Health

## 2022-10-26 ENCOUNTER — Ambulatory Visit (INDEPENDENT_AMBULATORY_CARE_PROVIDER_SITE_OTHER): Payer: 59 | Admitting: Adult Health

## 2022-10-26 VITALS — BP 136/68 | HR 67 | Ht 63.0 in | Wt 252.0 lb

## 2022-10-26 DIAGNOSIS — M542 Cervicalgia: Secondary | ICD-10-CM | POA: Diagnosis not present

## 2022-10-26 DIAGNOSIS — G43019 Migraine without aura, intractable, without status migrainosus: Secondary | ICD-10-CM

## 2022-10-26 DIAGNOSIS — G4733 Obstructive sleep apnea (adult) (pediatric): Secondary | ICD-10-CM

## 2022-10-26 NOTE — Patient Instructions (Signed)
Your Plan:  Start CPAP  FU in 45 days to review CPAP If your symptoms worsen or you develop new symptoms please let us know.  Thank you for coming to see [place] at [facility]. I hope we have been able to provide you high quality care today.  You may receive a patient satisfaction survey over the next few weeks. We would appreciate your feedback and comments so that we may continue to improve ourselves and the health of our patients.

## 2022-10-26 NOTE — Progress Notes (Signed)
PATIENT: Rachael Brewer DOB: Oct 19, 1960  REASON FOR VISIT: follow up HISTORY FROM: patient PRIMARY NEUROLOGIST: Dr. Lovenia Shuck  Chief Complaint  Patient presents with   RM 4    Patient is here alone for migraine follow-up. She weaned herself off the Topiramate and doesn't feel it was helpful. She gets migraines every other day. If it is at nighttime she will take Rizatriptan and that helps. During the day Excedrin Migraine helps the most. She takes Excedrin about 3-4 days per week. Typically wakes up with a migraine on the right side (not always) but sometimes they will come in the evening.  She wants more baclofen and feels she needs enough take BID.      HISTORY OF PRESENT ILLNESS: Today 10/26/22  Rachael Brewer is a 62 y.o. female who has been followed in this office for Migraine headaches. Returns today for follow-up. Reports that she is having migraines  Every other day- Wake up with most headaches. Will take excedrin for migraines. Weaned herself off Qudexy as she didn't find it beneficial. Continues on Gabapentin and baclofen.   Has a diagnosis of obstructive sleep apnea but this is not managed by our office.  She states that she has not used her machine in over 9 months.  She also states that since she has been put on machine she has not had any regular follow-ups with a physician to review her data   HISTORY Brief HPI: 62 year old female with a history of anxiety, depression, OSA who follows in clinic for migraines. MRI brain 10/27/20 was unremarkable.   At her last visit Qudexy was increased to 100 mg daily and continued gabapentin for headache prevention.  Also discussed avoidance of OTC overuse     Interval History: She currently experiences migraine about every other day typically upon awakening.  She does believe headache severity has improved since increasing Qudexy dosage.  She will occasionally experience 1 in the afternoon.  She will typically take Excedrin  with morning headache and rizatriptan with evening headache.  She has not been using her CPAP routinely over the past few months as her house is currently under Holiday representative.  She continues to work with PT for cervicalgia and dry needling which has been greatly beneficial.  She continues to experience significant family stressors especially since the passing of her husband a year ago, believes she has been able to start the grieving process over the past 6 months.  She routinely participates with grief counseling which has been beneficial.  She does complain of memory loss, will misplace items around her home, will forget driving directions to known locations, forgot she already saw renovations done at her friend's home. Believes this has been more present over the past 6 months.        Headache days per month: 15-30 Headache free days per month: 0-15   Current Headache Regimen: Preventative: Qudexy 100 mg daily, gabapentin 600 mg BID Abortive: Excedrin, Maxalt 10 mg 7PRN, baclofen PRN   Prior Therapies                                  Rescue: Excedrin BC powder Cambia Frovatriptan Imitrex Maxalt Relpax Zomig Reglan Baclofen  Flexeril Preventive: Gabapentin Topamax Citalopram Escitalopram Sertraline Paroxetine Occipital nerve block Trigger point injections  REVIEW OF SYSTEMS: Out of a complete 14 system review of symptoms, the patient complains only of the following symptoms,  and all other reviewed systems are negative.  ALLERGIES: Allergies  Allergen Reactions   Bactrim [Sulfamethoxazole-Trimethoprim] Shortness Of Breath and Swelling   Sulfa Antibiotics Anaphylaxis   Penicillins Hives   Sumatriptan     Made migraine more intense and scalp felt like it was on fire    HOME MEDICATIONS: Outpatient Medications Prior to Visit  Medication Sig Dispense Refill   albuterol (VENTOLIN HFA) 108 (90 Base) MCG/ACT inhaler INHALE 1-2 PUFFS BY MOUTH EVERY 6 HOURS AS NEEDED FOR  WHEEZE OR SHORTNESS OF BREATH 6.7 each 3   aspirin-acetaminophen-caffeine (EXCEDRIN MIGRAINE) 250-250-65 MG tablet Take 2 tablets by mouth every 8 (eight) hours as needed for migraine.      Azelastine HCl 137 MCG/SPRAY SOLN Place 1 spray into both nostrils at bedtime.     baclofen (LIORESAL) 10 MG tablet TAKE 1 TABLET BY MOUTH TWICE A DAY AS NEEDED FOR MUSCLE SPASMS 30 tablet 0   buPROPion (WELLBUTRIN XL) 150 MG 24 hr tablet TAKE 1 TABLET BY MOUTH EVERY DAY 30 tablet 2   buPROPion (WELLBUTRIN XL) 300 MG 24 hr tablet TAKE 1 TABLET BY MOUTH EVERY DAY 90 tablet 1   clobetasol cream (TEMOVATE) 0.05 % Apply 1 Application topically 2 (two) times daily. 30 g 0   fluticasone (FLONASE) 50 MCG/ACT nasal spray Place 2 sprays into both nostrils daily. 16 g 11   gabapentin (NEURONTIN) 600 MG tablet Take 1 tablet (600 mg total) by mouth 2 (two) times daily. 60 tablet 0   Multiple Vitamin (MULTIVITAMIN WITH MINERALS) TABS tablet Take 1 tablet by mouth daily.     omeprazole (PRILOSEC) 40 MG capsule TAKE 1 CAPSULE (40 MG TOTAL) BY MOUTH DAILY. 90 capsule 2   rizatriptan (MAXALT) 10 MG tablet Take 10 mg by mouth every 2 (two) hours as needed for migraine (max 2 tablets/24 hrs.).   0   sertraline (ZOLOFT) 100 MG tablet Take 1 tablet (100 mg total) by mouth daily. 30 tablet 3   torsemide (DEMADEX) 20 MG tablet TAKE 2 TABLETS (40 MG TOTAL) BY MOUTH DAILY. 60 tablet 2   triamcinolone ointment (KENALOG) 0.5 % Apply 1 Application topically 2 (two) times daily. 30 g 0   topiramate ER (QUDEXY XR) 150 MG CS24 sprinkle capsule Take 1 capsule (150 mg total) by mouth daily. (Patient not taking: Reported on 10/26/2022) 30 capsule 5   No facility-administered medications prior to visit.    PAST MEDICAL HISTORY: Past Medical History:  Diagnosis Date   Anxiety    Arthritis    Complication of anesthesia    CAUSES MIGRAINE   Depression    Diabetes (HCC)    Edema of both lower extremities    GERD (gastroesophageal reflux  disease)    Headache    Menetrier disease    Migraines    Pre-diabetes    Sleep apnea    CPAP    PAST SURGICAL HISTORY: Past Surgical History:  Procedure Laterality Date   ANTERIOR CRUCIATE LIGAMENT REPAIR Right 1996   COLONOSCOPY WITH PROPOFOL N/A 02/25/2022   Procedure: COLONOSCOPY WITH PROPOFOL;  Surgeon: Midge Minium, MD;  Location: [facility];  Service: Endoscopy;  Laterality: N/A;   DILATION AND CURETTAGE OF UTERUS  2004   FOOT SURGERY Bilateral    JOINT REPLACEMENT Right    KNEE ARTHROPLASTY Right 05/02/2019   Procedure: COMPUTER ASSISTED TOTAL KNEE ARTHROPLASTY;  Surgeon: Donato Heinz, MD;  Location: [facility];  Service: Orthopedics;  Laterality: Right;   KNEE ARTHROPLASTY Left 07/09/2019  Procedure: COMPUTER ASSISTED TOTAL KNEE ARTHROPLASTY;  Surgeon: Donato Heinz, MD;  Location: [facility];  Service: Orthopedics;  Laterality: Left;   KNEE SURGERY Right    MENISCUS REPAIR Bilateral     FAMILY HISTORY: Family History  Problem Relation Age of Onset   Hypertension Brother    Diabetes Brother    Diabetes Other    Hypertension Other    Glaucoma Other    Breast cancer Maternal Aunt        32's    SOCIAL HISTORY: Social History   Socioeconomic History   Marital status: Widowed    Spouse name: Not on file   Number of children: 1   Years of education: Not on file   Highest education level: Not on file  Occupational History   Not on file  Tobacco Use   Smoking status: Former    Packs/day: 1.00    Years: 15.00    Additional pack years: 0.00    Total pack years: 15.00    Types: Cigarettes    Quit date: 06/21/1999    Years since quitting: 23.3   Smokeless tobacco: Never  Vaping Use   Vaping Use: Never used  Substance and Sexual Activity   Alcohol use: Yes    Alcohol/week: 0.0 standard drinks of alcohol    Comment: occasional   Drug use: No   Sexual activity: Yes    Birth control/protection: None  Other Topics Concern   Not on file  Social  History Narrative   1 adopted boy and 2 grandsons live with her- per patient the boys moved back with their mother 11/2021   Caffeine- coffee 1 c daily   Patient loss he husband November 28,2022.   Lives only with her son.   Social Determinants of Health   Financial Resource Strain: Low Risk  (01/13/2022)   Overall Financial Resource Strain (CARDIA)    Difficulty of Paying Living Expenses: Not hard at all  Food Insecurity: No Food Insecurity (01/13/2022)   Hunger Vital Sign    Worried About Running Out of Food in the Last Year: Never true    Ran Out of Food in the Last Year: Never true  Transportation Needs: Not on file  Physical Activity: Not on file  Stress: Not on file  Social Connections: Not on file  Intimate Partner Violence: Not on file      PHYSICAL EXAM  Vitals:   10/26/22 1403  BP: 136/68  Pulse: 67  Weight: 252 lb (114.3 kg)  Height: 5\' 3"  (1.6 m)   Body mass index is 44.64 kg/m.  Generalized: Well developed, in no acute distress   Neurological examination  Mentation: Alert oriented to time, place, history taking. Follows all commands speech and language fluent Cranial nerve II-XII: Pupils were equal round reactive to light. Extraocular movements were full, visual field were full on confrontational test. Facial sensation and strength were normal. Uvula tongue midline. Head turning and shoulder shrug  were normal and symmetric. Motor: The motor testing reveals 5 over 5 strength of all 4 extremities. Good symmetric motor tone is noted throughout.  Sensory: Sensory testing is intact to soft touch on all 4 extremities. No evidence of extinction is noted.  Coordination: Cerebellar testing reveals good finger-nose-finger and heel-to-shin bilaterally.  Gait and station: Gait is normal.  Reflexes: Deep tendon reflexes are symmetric and normal bilaterally.   DIAGNOSTIC DATA (LABS, IMAGING, TESTING) - I reviewed patient records, labs, notes, testing and imaging myself  where available.  Lab Results  Component Value Date   WBC 6.3 01/11/2022   HGB 15.3 01/11/2022   HCT 45.1 01/11/2022   MCV 91 01/11/2022   PLT 293 01/11/2022      Component Value Date/Time   NA 144 11/09/2021 0844   K 4.7 11/09/2021 0844   CL 104 11/09/2021 0844   CO2 27 11/09/2021 0844   GLUCOSE 115 (H) 11/09/2021 0844   GLUCOSE 104 (H) 12/26/2019 0426   BUN 20 11/09/2021 0844   CREATININE 0.84 11/09/2021 0844   CREATININE 0.92 05/13/2017 0855   CALCIUM 9.2 11/09/2021 0844   PROT 6.2 11/09/2021 0844   ALBUMIN 4.0 11/09/2021 0844   AST 23 11/09/2021 0844   ALT 17 11/09/2021 0844   ALKPHOS 70 11/09/2021 0844   BILITOT 0.4 11/09/2021 0844   GFRNONAA 65 07/03/2020 1134   GFRNONAA 69 05/13/2017 0855   GFRAA 75 07/03/2020 1134   GFRAA 80 05/13/2017 0855   Lab Results  Component Value Date   CHOL 130 11/09/2021   HDL 43 11/09/2021   LDLCALC 70 11/09/2021   TRIG 91 11/09/2021   CHOLHDL 3.0 11/09/2021   Lab Results  Component Value Date   HGBA1C 6.2 (H) 11/09/2021   Lab Results  Component Value Date   VITAMINB12 536 01/11/2022   Lab Results  Component Value Date   TSH 1.660 01/11/2022      ASSESSMENT AND PLAN 62 y.o. year old female  has a past medical history of Anxiety, Arthritis, Complication of anesthesia, Depression, Diabetes (HCC), Edema of both lower extremities, GERD (gastroesophageal reflux disease), Headache, Menetrier disease, Migraines, Pre-diabetes, and Sleep apnea. here with:  1.  Migraine headaches 2.  Obstructive sleep apnea   The patient is waking up with most of her headaches.  Advised that this could be due to obstructive sleep apnea.  Encouraged her to restart CPAP therapy and I will review a download with her in 30 to 45 days.  During that time we can also see if her headache improves with consistent use of CPAP therapy.  She is amenable to this plan.  For now she will continue on gabapentin 600 mg twice a day and baclofen 10 mg twice a day  for muscle spasms.    Butch Penny, MSN, NP-C 10/26/2022, 2:05 PM [facility] [address], [address] [place], [place] [address] [phone]

## 2022-11-03 ENCOUNTER — Ambulatory Visit: Payer: 59 | Admitting: Podiatry

## 2022-11-20 ENCOUNTER — Other Ambulatory Visit: Payer: Self-pay | Admitting: Psychiatry

## 2022-11-23 NOTE — Telephone Encounter (Signed)
Last seen on 10/26/22 per note "For now she will continue on gabapentin 600 mg twice a day and baclofen 10 mg twice a day for muscle spasms." Follow up scheduled on 12/20/22 Last filled on 10/20/22 #30 tablets (15 day supply)

## 2022-11-30 ENCOUNTER — Encounter: Payer: Self-pay | Admitting: Family Medicine

## 2022-11-30 ENCOUNTER — Ambulatory Visit (INDEPENDENT_AMBULATORY_CARE_PROVIDER_SITE_OTHER): Payer: 59 | Admitting: Family Medicine

## 2022-11-30 VITALS — BP 139/74 | HR 69 | Temp 98.8°F | Wt 246.0 lb

## 2022-11-30 DIAGNOSIS — M62830 Muscle spasm of back: Secondary | ICD-10-CM

## 2022-11-30 DIAGNOSIS — M545 Low back pain, unspecified: Secondary | ICD-10-CM | POA: Diagnosis not present

## 2022-11-30 LAB — POCT URINALYSIS DIPSTICK
Bilirubin, UA: NEGATIVE
Blood, UA: NEGATIVE
Glucose, UA: NEGATIVE
Ketones, UA: NEGATIVE
Leukocytes, UA: NEGATIVE
Nitrite, UA: NEGATIVE
Protein, UA: NEGATIVE
Spec Grav, UA: 1.02 (ref 1.010–1.025)
Urobilinogen, UA: 0.2 E.U./dL
pH, UA: 5 (ref 5.0–8.0)

## 2022-11-30 MED ORDER — BACLOFEN 10 MG PO TABS: 10.0000 mg | ORAL_TABLET | ORAL | 0 refills | Status: DC | PRN

## 2022-11-30 NOTE — Progress Notes (Signed)
Established patient visit   Patient: Rachael Brewer   DOB: 03/14/1961   62 y.o. Female  MRN: [id number] Visit Date: 11/30/2022  Today's healthcare provider: Sherlyn Hay, DO   Chief Complaint  Patient presents with   Back Pain   Subjective    HPI  Patient is a 62 year old female who presents for evaluation of low back, right hip and right groin pain.  She said it started about 1 month ago with just the low back pain.  It would hurt on the left sometimes but mostly the right.  She said over the last week it has intensified.  She denies any known injury.  She has been using ice and heat with some relief.   She denies any urinary symptoms.  Pain occurs on both sides but right side will wrap to front on occasion, and sometimes includes her right groin.  - No pain over spine. - Describes it as achy pain with intermittent sharp pains when moving in certain positions or trying to get out of bed.  States that it took an hour of moving around and stretching yesterday, as well as a couple days before that (on Saturday), to be able to get out of bed. - Can move after stretching in bed but still has pain there. - Has been doing a lot of gardening while sitting on a low stool, bending back and forth. Does this ever year; started this year somewhere between mid-April and early May.  She has been tilling back yard, which her late husband normally did; her son helps some. - Her back hurts a lot while standing (such as while washing dishes; has to take breaks).  No regular exercise routine. Does stretch a lot in the morning. Has had a lot of stress between husband dying 1.5 years ago and son being in hospital recently for behavioral health reasons.  Has had intermittent low back pain and neck pain for years.   Medications: Outpatient Medications Prior to Visit  Medication Sig   albuterol (VENTOLIN HFA) 108 (90 Base) MCG/ACT inhaler INHALE 1-2 PUFFS BY MOUTH EVERY 6 HOURS AS NEEDED FOR  WHEEZE OR SHORTNESS OF BREATH   aspirin-acetaminophen-caffeine (EXCEDRIN MIGRAINE) 250-250-65 MG tablet Take 2 tablets by mouth every 8 (eight) hours as needed for migraine.    Azelastine HCl 137 MCG/SPRAY SOLN Place 1 spray into both nostrils at bedtime.   baclofen (LIORESAL) 10 MG tablet TAKE 1 TABLET BY MOUTH TWICE A DAY AS NEEDED FOR MUSCLE SPASM   buPROPion (WELLBUTRIN XL) 150 MG 24 hr tablet TAKE 1 TABLET BY MOUTH EVERY DAY   buPROPion (WELLBUTRIN XL) 300 MG 24 hr tablet TAKE 1 TABLET BY MOUTH EVERY DAY   clobetasol cream (TEMOVATE) 0.05 % Apply 1 Application topically 2 (two) times daily.   fluticasone (FLONASE) 50 MCG/ACT nasal spray Place 2 sprays into both nostrils daily.   gabapentin (NEURONTIN) 600 MG tablet Take 1 tablet (600 mg total) by mouth 2 (two) times daily.   Multiple Vitamin (MULTIVITAMIN WITH MINERALS) TABS tablet Take 1 tablet by mouth daily.   omeprazole (PRILOSEC) 40 MG capsule TAKE 1 CAPSULE (40 MG TOTAL) BY MOUTH DAILY.   rizatriptan (MAXALT) 10 MG tablet Take 10 mg by mouth every 2 (two) hours as needed for migraine (max 2 tablets/24 hrs.).    sertraline (ZOLOFT) 100 MG tablet Take 1 tablet (100 mg total) by mouth daily.   torsemide (DEMADEX) 20 MG tablet TAKE 2 TABLETS (  40 MG TOTAL) BY MOUTH DAILY.   triamcinolone ointment (KENALOG) 0.5 % Apply 1 Application topically 2 (two) times daily.   topiramate ER (QUDEXY XR) 150 MG CS24 sprinkle capsule Take 1 capsule (150 mg total) by mouth daily. (Patient not taking: Reported on 10/26/2022)   No facility-administered medications prior to visit.    Review of Systems  Constitutional:  Negative for appetite change, chills, fatigue and fever.  Eyes:  Negative for visual disturbance.  Respiratory:  Negative for shortness of breath.   Cardiovascular:  Negative for chest pain and palpitations.  Gastrointestinal:  Negative for abdominal pain, nausea and vomiting.  Genitourinary:  Positive for flank pain. Negative for difficulty  urinating, dysuria, frequency, hematuria and urgency.  Musculoskeletal:  Positive for back pain and gait problem. Negative for arthralgias, joint swelling, neck pain and neck stiffness.  Skin:  Negative for color change and rash.  Neurological:  Negative for dizziness, light-headedness, numbness and headaches.       Objective    BP 139/74 (BP Location: Left Arm, Patient Position: Sitting, Cuff Size: Large)   Pulse 69   Temp 98.8 F (37.1 C) (Oral)   Wt 246 lb (111.6 kg)   LMP  (LMP Unknown)   SpO2 97%   BMI 43.58 kg/m    Physical Exam Vitals reviewed.  Constitutional:      General: She is not in acute distress.    Appearance: Normal appearance. She is well-developed. She is not diaphoretic.  HENT:     Head: Normocephalic and atraumatic.  Eyes:     General: No scleral icterus.    Conjunctiva/sclera: Conjunctivae normal.  Neck:     Thyroid: No thyromegaly.  Cardiovascular:     Rate and Rhythm: Normal rate and regular rhythm.     Pulses: Normal pulses.     Heart sounds: Normal heart sounds. No murmur heard. Pulmonary:     Effort: Pulmonary effort is normal. No respiratory distress.     Breath sounds: Normal breath sounds. No wheezing, rhonchi or rales.  Musculoskeletal:     Cervical back: Neck supple.     Lumbar back: Spasms (R > L) and tenderness (R > L) present. No edema, signs of trauma or bony tenderness. Normal range of motion.     Right lower leg: No edema.     Left lower leg: No edema.  Lymphadenopathy:     Cervical: No cervical adenopathy.  Skin:    General: Skin is warm and dry.     Findings: No rash.  Neurological:     Mental Status: She is alert and oriented to person, place, and time. Mental status is at baseline.  Psychiatric:        Mood and Affect: Mood normal.        Behavior: Behavior normal.      Results for orders placed or performed in visit on 11/30/22  POCT urinalysis dipstick  Result Value Ref Range   Color, UA yellow    Clarity, UA  clear    Glucose, UA Negative Negative   Bilirubin, UA neg    Ketones, UA neg    Spec Grav, UA 1.020 1.010 - 1.025   Blood, UA neg    pH, UA 5.0 5.0 - 8.0   Protein, UA Negative Negative   Urobilinogen, UA 0.2 0.2 or 1.0 E.U./dL   Nitrite, UA neg    Leukocytes, UA Negative Negative   Appearance     Odor      Assessment & Plan  1. Right-sided low back pain without sciatica, unspecified chronicity Point-of-care urinalysis negative; unlikely to be related to urinary tract infection.  Patient does have baclofen for her chronic neck pain at home, which she takes each night.  Will give her prescription for additional baclofen to be taken earlier in the day, such as in the morning.  Cautioned her not to drive while taking this medication.  Referred her to physical therapy as noted below for her to learn stretching and strengthening exercises she can do to help her current pain and avoid this happening in the future. - POCT urinalysis dipstick - Ambulatory referral to Physical Therapy - baclofen (LIORESAL) 10 MG tablet; Take 1 tablet (10 mg total) by mouth as needed for muscle spasms.  Dispense: 21 each; Refill: 0  2. Muscle spasm of back - baclofen (LIORESAL) 10 MG tablet; Take 1 tablet (10 mg total) by mouth as needed for muscle spasms.  Dispense: 21 each; Refill: 0   Return in about 4 weeks (around 12/28/2022), or if symptoms worsen or fail to improve.      The entirety of the information documented in the History of Present Illness, Review of Systems and Physical Exam were personally obtained by me. Portions of this information were initially documented by the CMA, Adline Peals, and reviewed by me for thoroughness and accuracy.  I discussed the assessment and treatment plan with the patient  The patient was provided an opportunity to ask questions and all were answered. The patient agreed with the plan and demonstrated an understanding of the instructions.   The patient was advised to  call back or seek an in-person evaluation if the symptoms worsen or if the condition fails to improve as anticipated.    Sherlyn Hay, DO  [facility] [phone] (phone) [phone] (fax)  [facility]

## 2022-12-10 ENCOUNTER — Other Ambulatory Visit: Payer: Self-pay | Admitting: Psychiatry

## 2022-12-13 ENCOUNTER — Telehealth: Payer: Self-pay | Admitting: Adult Health

## 2022-12-13 DIAGNOSIS — M180 Bilateral primary osteoarthritis of first carpometacarpal joints: Secondary | ICD-10-CM | POA: Diagnosis not present

## 2022-12-13 DIAGNOSIS — G5603 Carpal tunnel syndrome, bilateral upper limbs: Secondary | ICD-10-CM | POA: Diagnosis not present

## 2022-12-13 NOTE — Telephone Encounter (Signed)
We received Rx refill request from pharmacy. Refill has been sent.

## 2022-12-13 NOTE — Telephone Encounter (Signed)
Pt requesting refill on gabapentin (NEURONTIN) 600 MG tablet. Should be sent to [facility] [phone] -

## 2022-12-18 ENCOUNTER — Other Ambulatory Visit: Payer: Self-pay | Admitting: Family Medicine

## 2022-12-18 DIAGNOSIS — M545 Low back pain, unspecified: Secondary | ICD-10-CM

## 2022-12-18 DIAGNOSIS — R5383 Other fatigue: Secondary | ICD-10-CM

## 2022-12-18 DIAGNOSIS — M62830 Muscle spasm of back: Secondary | ICD-10-CM

## 2022-12-20 ENCOUNTER — Ambulatory Visit: Payer: 59 | Admitting: Adult Health

## 2022-12-22 ENCOUNTER — Ambulatory Visit: Payer: 59 | Admitting: Adult Health

## 2022-12-25 ENCOUNTER — Other Ambulatory Visit: Payer: Self-pay | Admitting: Adult Health

## 2022-12-27 DIAGNOSIS — H2513 Age-related nuclear cataract, bilateral: Secondary | ICD-10-CM | POA: Diagnosis not present

## 2022-12-27 DIAGNOSIS — H40009 Preglaucoma, unspecified, unspecified eye: Secondary | ICD-10-CM | POA: Diagnosis not present

## 2023-01-04 DIAGNOSIS — Z008 Encounter for other general examination: Secondary | ICD-10-CM | POA: Diagnosis not present

## 2023-01-04 DIAGNOSIS — I509 Heart failure, unspecified: Secondary | ICD-10-CM | POA: Diagnosis not present

## 2023-01-04 DIAGNOSIS — I110 Hypertensive heart disease with heart failure: Secondary | ICD-10-CM | POA: Diagnosis not present

## 2023-01-04 DIAGNOSIS — Z880 Allergy status to penicillin: Secondary | ICD-10-CM | POA: Diagnosis not present

## 2023-01-04 DIAGNOSIS — Z791 Long term (current) use of non-steroidal anti-inflammatories (NSAID): Secondary | ICD-10-CM | POA: Diagnosis not present

## 2023-01-04 DIAGNOSIS — Z8249 Family history of ischemic heart disease and other diseases of the circulatory system: Secondary | ICD-10-CM | POA: Diagnosis not present

## 2023-01-04 DIAGNOSIS — Z79899 Other long term (current) drug therapy: Secondary | ICD-10-CM | POA: Diagnosis not present

## 2023-01-04 DIAGNOSIS — Z833 Family history of diabetes mellitus: Secondary | ICD-10-CM | POA: Diagnosis not present

## 2023-01-04 DIAGNOSIS — J45909 Unspecified asthma, uncomplicated: Secondary | ICD-10-CM | POA: Diagnosis not present

## 2023-01-04 DIAGNOSIS — M1990 Unspecified osteoarthritis, unspecified site: Secondary | ICD-10-CM | POA: Diagnosis not present

## 2023-01-04 DIAGNOSIS — F419 Anxiety disorder, unspecified: Secondary | ICD-10-CM | POA: Diagnosis not present

## 2023-01-04 DIAGNOSIS — K219 Gastro-esophageal reflux disease without esophagitis: Secondary | ICD-10-CM | POA: Diagnosis not present

## 2023-01-04 DIAGNOSIS — Z87891 Personal history of nicotine dependence: Secondary | ICD-10-CM | POA: Diagnosis not present

## 2023-01-05 ENCOUNTER — Ambulatory Visit: Payer: 59 | Admitting: Adult Health

## 2023-01-14 ENCOUNTER — Ambulatory Visit (INDEPENDENT_AMBULATORY_CARE_PROVIDER_SITE_OTHER): Payer: 59 | Admitting: Family Medicine

## 2023-01-14 ENCOUNTER — Encounter: Payer: Self-pay | Admitting: Family Medicine

## 2023-01-14 ENCOUNTER — Other Ambulatory Visit (HOSPITAL_COMMUNITY)
Admission: RE | Admit: 2023-01-14 | Discharge: 2023-01-14 | Disposition: A | Discharge status: Home / Self Care | Payer: 59 | Source: Ambulatory Visit | Attending: Family Medicine | Admitting: Family Medicine

## 2023-01-14 VITALS — BP 128/67 | HR 69 | Temp 98.2°F | Resp 12 | Ht 63.0 in | Wt 250.0 lb

## 2023-01-14 DIAGNOSIS — M545 Low back pain, unspecified: Secondary | ICD-10-CM | POA: Diagnosis not present

## 2023-01-14 DIAGNOSIS — Z0000 Encounter for general adult medical examination without abnormal findings: Secondary | ICD-10-CM

## 2023-01-14 DIAGNOSIS — F339 Major depressive disorder, recurrent, unspecified: Secondary | ICD-10-CM | POA: Diagnosis not present

## 2023-01-14 DIAGNOSIS — Z124 Encounter for screening for malignant neoplasm of cervix: Secondary | ICD-10-CM | POA: Diagnosis not present

## 2023-01-14 DIAGNOSIS — R87610 Atypical squamous cells of undetermined significance on cytologic smear of cervix (ASC-US): Secondary | ICD-10-CM

## 2023-01-14 DIAGNOSIS — Z1231 Encounter for screening mammogram for malignant neoplasm of breast: Secondary | ICD-10-CM

## 2023-01-14 DIAGNOSIS — M62830 Muscle spasm of back: Secondary | ICD-10-CM

## 2023-01-14 DIAGNOSIS — R7303 Prediabetes: Secondary | ICD-10-CM

## 2023-01-14 MED ORDER — BACLOFEN 10 MG PO TABS: 10.0000 mg | ORAL_TABLET | Freq: Three times a day (TID) | ORAL | 2 refills | Status: DC | PRN

## 2023-01-14 NOTE — Patient Instructions (Signed)
Please call [facility] to schedule your annual routine mammogram [phone]  

## 2023-01-14 NOTE — Progress Notes (Signed)
Complete physical exam  Patient: Rachael Brewer   DOB: 05/26/1961   62 y.o. Female  MRN: [id number]  Subjective:    Chief Complaint  Patient presents with   Annual Exam    Rachael Brewer is a 62 y.o. female who presents today for a complete physical exam. She reports consuming a general diet.  She generally feels well. She reports sleeping fairly well. She does not have additional problems to discuss today.    Discussed the use of AI scribe software for clinical note transcription with the patient, who gave verbal consent to proceed.  History of Present Illness   The patient, a 62 year old with a history of Major Depressive Disorder (MDD), presents for an annual physical. She is currently managed on Wellbutrin XL 450 mg daily and Zoloft 100 mg daily for MDD, and torsemide 40 mg daily for edema.  In addition to her scheduled screenings, the patient reports worsening arthritis in her hands, causing significant pain. She has sought treatment from [facility], who prescribed meloxicam and nighttime braces.        Most recent fall risk assessment:    01/14/2023   10:28 AM  Fall Risk   Falls in the past year? 0  Number falls in past yr: 0  Injury with Fall? 0  Risk for fall due to : No Fall Risks  Follow up Falls evaluation completed     Most recent depression screenings:    01/14/2023   10:28 AM 09/30/2022   10:45 AM  PHQ 2/9 Scores  PHQ - 2 Score 2 3  PHQ- 9 Score 11 9        Patient Care Team: Erasmo Downer, MD as PCP - General (Family Medicine) Wenda Overland, LCSW as Social Worker   Outpatient Medications Prior to Visit  Medication Sig   albuterol (VENTOLIN HFA) 108 (90 Base) MCG/ACT inhaler INHALE 1-2 PUFFS BY MOUTH EVERY 6 HOURS AS NEEDED FOR WHEEZE OR SHORTNESS OF BREATH   aspirin-acetaminophen-caffeine (EXCEDRIN MIGRAINE) 250-250-65 MG tablet Take 2 tablets by mouth every 8 (eight) hours as needed for migraine.    Azelastine HCl 137 MCG/SPRAY  SOLN Place 1 spray into both nostrils at bedtime.   buPROPion (WELLBUTRIN XL) 150 MG 24 hr tablet TAKE 1 TABLET BY MOUTH EVERY DAY   buPROPion (WELLBUTRIN XL) 300 MG 24 hr tablet TAKE 1 TABLET BY MOUTH EVERY DAY   clobetasol cream (TEMOVATE) 0.05 % Apply 1 Application topically 2 (two) times daily.   fluticasone (FLONASE) 50 MCG/ACT nasal spray Place 2 sprays into both nostrils daily.   gabapentin (NEURONTIN) 600 MG tablet TAKE 1 TABLET BY MOUTH TWICE A DAY   meloxicam (MOBIC) 15 MG tablet Take 1 tablet by mouth daily.   Multiple Vitamin (MULTIVITAMIN WITH MINERALS) TABS tablet Take 1 tablet by mouth daily.   omeprazole (PRILOSEC) 40 MG capsule TAKE 1 CAPSULE (40 MG TOTAL) BY MOUTH DAILY.   rizatriptan (MAXALT) 10 MG tablet Take 10 mg by mouth every 2 (two) hours as needed for migraine (max 2 tablets/24 hrs.).    sertraline (ZOLOFT) 100 MG tablet TAKE 1 TABLET BY MOUTH EVERY DAY   torsemide (DEMADEX) 20 MG tablet TAKE 2 TABLETS (40 MG TOTAL) BY MOUTH DAILY.   triamcinolone ointment (KENALOG) 0.5 % Apply 1 Application topically 2 (two) times daily.   [DISCONTINUED] baclofen (LIORESAL) 10 MG tablet Take 1 tablet (10 mg total) by mouth as needed for muscle spasms.   topiramate ER (QUDEXY XR)  150 MG CS24 sprinkle capsule Take 1 capsule (150 mg total) by mouth daily.   [DISCONTINUED] baclofen (LIORESAL) 10 MG tablet TAKE 1 TABLET BY MOUTH TWICE A DAY AS NEEDED FOR MUSCLE SPASM   No facility-administered medications prior to visit.    ROS per HPI        Objective:     BP 128/67 (BP Location: Left Arm, Patient Position: Sitting, Cuff Size: Large)   Pulse 69   Temp 98.2 F (36.8 C) (Temporal)   Resp 12   Ht 5\' 3"  (1.6 m)   Wt 250 lb (113.4 kg)   LMP  (LMP Unknown)   SpO2 98%   BMI 44.29 kg/m    Physical Exam Vitals reviewed.  Constitutional:      General: She is not in acute distress.    Appearance: Normal appearance. She is well-developed. She is not diaphoretic.  HENT:      Head: Normocephalic and atraumatic.     Right Ear: Tympanic membrane, ear canal and external ear normal.     Left Ear: Tympanic membrane, ear canal and external ear normal.     Nose: Nose normal.     Mouth/Throat:     Mouth: Mucous membranes are moist.     Pharynx: Oropharynx is clear. No oropharyngeal exudate.  Eyes:     General: No scleral icterus.    Conjunctiva/sclera: Conjunctivae normal.     Pupils: Pupils are equal, round, and reactive to light.  Neck:     Thyroid: No thyromegaly.  Cardiovascular:     Rate and Rhythm: Normal rate and regular rhythm.     Heart sounds: Normal heart sounds. No murmur heard. Pulmonary:     Effort: Pulmonary effort is normal. No respiratory distress.     Breath sounds: Normal breath sounds. No wheezing or rales.  Abdominal:     General: There is no distension.     Palpations: Abdomen is soft.     Tenderness: There is no abdominal tenderness.  Genitourinary:    Comments: GYN:  External genitalia within normal limits.  Vaginal mucosa pink, moist, normal rugae.  Nonfriable cervix without lesions, no discharge or bleeding noted on speculum exam.  Musculoskeletal:        General: No deformity.     Cervical back: Neck supple.     Right lower leg: No edema.     Left lower leg: No edema.  Lymphadenopathy:     Cervical: No cervical adenopathy.  Skin:    General: Skin is warm and dry.     Findings: No rash.  Neurological:     Mental Status: She is alert and oriented to person, place, and time. Mental status is at baseline.     Gait: Gait normal.  Psychiatric:        Mood and Affect: Mood normal.        Behavior: Behavior normal.        Thought Content: Thought content normal.      No results found for any visits on 01/14/23.     Assessment & Plan:    Routine Health Maintenance and Physical Exam  Immunization History  Administered Date(s) Administered   COVID-19, mRNA, vaccine(Comirnaty)12 years and older 04/30/2022   Hepb-cpg 05/18/2022    Influenza Inj Mdck Quad Pf 03/13/2018   Influenza,inj,Quad PF,6+ Mos 04/02/2017, 02/09/2018, 05/03/2019, 03/29/2022   Influenza,inj,quad, With Preservative 04/02/2017   Influenza-Unspecified 05/17/2016, 04/21/2020   PFIZER(Purple Top)SARS-COV-2 Vaccination 12/15/2019, 01/05/2020, 07/03/2020   PNEUMOCOCCAL CONJUGATE-20 12/16/2020  Respiratory Syncytial Virus Vaccine,Recomb Aduvanted(Arexvy) 04/30/2022   Td 07/22/2000   Tdap 05/11/2017   Zoster Recombinant(Shingrix) 07/17/2019, 12/16/2020, 05/18/2022    Health Maintenance  Topic Date Due   PAP SMEAR-Modifier  12/12/2022   INFLUENZA VACCINE  01/20/2023   MAMMOGRAM  05/05/2024   DTaP/Tdap/Td (3 - Td or Tdap) 05/12/2027   Colonoscopy  02/26/2032   COVID-19 Vaccine  Completed   Hepatitis C Screening  Completed   HIV Screening  Completed   Zoster Vaccines- Shingrix  Completed   HPV VACCINES  Aged Out    Discussed health benefits of physical activity, and encouraged her to engage in regular exercise appropriate for her age and condition.  Problem List Items Addressed This Visit       Other   Depression, recurrent (HCC)   Morbid obesity (HCC)   Relevant Orders   Comprehensive metabolic panel   Lipid panel   Prediabetes   Relevant Orders   Hemoglobin A1c   Comprehensive metabolic panel   Lipid panel   Atypical squamous cell changes of undetermined significance (ASCUS) on cervical cytology with negative high risk human papilloma virus (HPV) test result   Relevant Orders   Cytology - PAP   Other Visit Diagnoses     Encounter for annual physical exam    -  Primary   Relevant Orders   MM 3D SCREENING MAMMOGRAM BILATERAL BREAST   Cytology - PAP   Hemoglobin A1c   Comprehensive metabolic panel   Lipid panel   Breast cancer screening by mammogram       Relevant Orders   MM 3D SCREENING MAMMOGRAM BILATERAL BREAST   Cervical cancer screening       Relevant Orders   Cytology - PAP   Right-sided low back pain without  sciatica, unspecified chronicity       Relevant Medications   meloxicam (MOBIC) 15 MG tablet   baclofen (LIORESAL) 10 MG tablet   Other Relevant Orders   Ambulatory referral to Physical Therapy   Muscle spasm of back       Relevant Medications   baclofen (LIORESAL) 10 MG tablet   Other Relevant Orders   Ambulatory referral to Physical Therapy           Arthritis: Pain in hands managed by [facility] with Meloxicam and night braces. -Continue current management plan.  Major Depressive Disorder: Stable on Wellbutrin XL 450mg  daily and Zoloft 100mg  daily. -Continue current medications.  Edema: Managed with Torsemide 40mg  daily. -Continue current medication.  General Health Maintenance: -Perform Pap smear today (last ASCUS HPV negative on 12/12/2019). -Schedule mammogram for November 2024.        Return in about 6 months (around 07/17/2023) for chronic disease f/u.     Shirlee Latch, MD

## 2023-01-24 ENCOUNTER — Other Ambulatory Visit: Payer: Self-pay | Admitting: Adult Health

## 2023-02-17 ENCOUNTER — Ambulatory Visit (INDEPENDENT_AMBULATORY_CARE_PROVIDER_SITE_OTHER): Payer: 59 | Admitting: Adult Health

## 2023-02-17 ENCOUNTER — Encounter: Payer: Self-pay | Admitting: Adult Health

## 2023-02-17 VITALS — BP 116/62 | Ht 63.0 in | Wt 245.0 lb

## 2023-02-17 DIAGNOSIS — G43009 Migraine without aura, not intractable, without status migrainosus: Secondary | ICD-10-CM | POA: Diagnosis not present

## 2023-02-17 DIAGNOSIS — G4733 Obstructive sleep apnea (adult) (pediatric): Secondary | ICD-10-CM

## 2023-02-28 ENCOUNTER — Other Ambulatory Visit: Payer: Self-pay | Admitting: Adult Health

## 2023-03-04 ENCOUNTER — Other Ambulatory Visit: Payer: Self-pay | Admitting: Family Medicine

## 2023-03-04 DIAGNOSIS — R601 Generalized edema: Secondary | ICD-10-CM

## 2023-03-06 DIAGNOSIS — M160 Bilateral primary osteoarthritis of hip: Secondary | ICD-10-CM | POA: Insufficient documentation

## 2023-03-14 ENCOUNTER — Other Ambulatory Visit: Payer: Self-pay | Admitting: Family Medicine

## 2023-03-22 ENCOUNTER — Other Ambulatory Visit: Payer: Self-pay | Admitting: Family Medicine

## 2023-03-22 DIAGNOSIS — R5383 Other fatigue: Secondary | ICD-10-CM

## 2023-03-23 NOTE — Telephone Encounter (Signed)
Requested Prescriptions  Pending Prescriptions Disp Refills   buPROPion (WELLBUTRIN XL) 150 MG 24 hr tablet [Pharmacy Med Name: BUPROPION HCL XL 150 MG TABLET] 90 tablet 0    Sig: TAKE 1 TABLET BY MOUTH EVERY DAY     Psychiatry: Antidepressants - bupropion Failed - 03/22/2023  2:31 PM      Failed - Cr in normal range and within 360 days    Creat  Date Value Ref Range Status  05/13/2017 0.92 0.50 - 1.05 mg/dL Final    Comment:    For patients >62 years of age, the reference limit for Creatinine is approximately 13% higher for people identified as African-American. .    Creatinine, Ser  Date Value Ref Range Status  01/14/2023 1.07 (H) 0.57 - 1.00 mg/dL Final         Passed - AST in normal range and within 360 days    AST  Date Value Ref Range Status  01/14/2023 22 0 - 40 IU/L Final         Passed - ALT in normal range and within 360 days    ALT  Date Value Ref Range Status  01/14/2023 15 0 - 32 IU/L Final         Passed - Completed PHQ-2 or PHQ-9 in the last 360 days      Passed - Last BP in normal range    BP Readings from Last 1 Encounters:  02/17/23 116/62         Passed - Valid encounter within last 6 months    Recent Outpatient Visits           2 months ago Encounter for annual physical exam   [place] [facility] [place], Marzella Schlein, MD   3 months ago Right-sided low back pain without sciatica, unspecified chronicity   [place] [facility] [place], Monico Blitz, DO   5 months ago Depression, recurrent [facility])   [place] [facility] [place], Marzella Schlein, MD   9 months ago Sexual arousal disorder    [facility] [place], Marzella Schlein, MD   10 months ago Lateral epicondylitis of left elbow   [facility] Caro Laroche, DO       Future Appointments             In 3 months Bacigalupo, Marzella Schlein, MD [facility], [facility]              sertraline (ZOLOFT) 100 MG tablet [Pharmacy Med Name: SERTRALINE HCL 100 MG TABLET] 90 tablet 0    Sig: TAKE 1 TABLET BY MOUTH EVERY DAY     Psychiatry:  Antidepressants - SSRI - sertraline Passed - 03/22/2023  2:31 PM      Passed - AST in normal range and within 360 days    AST  Date Value Ref Range Status  01/14/2023 22 0 - 40 IU/L Final         Passed - ALT in normal range and within 360 days    ALT  Date Value Ref Range Status  01/14/2023 15 0 - 32 IU/L Final         Passed - Completed PHQ-2 or PHQ-9 in the last 360 days      Passed - Valid encounter within last 6 months    Recent Outpatient Visits           2 months ago Encounter for annual physical exam  [facility] [place], Marzella Schlein, MD   3 months ago Right-sided low back pain without sciatica, unspecified chronicity   [place] [facility] [place], Monico Blitz, DO   5 months ago Depression, recurrent [facility])   [place] [facility] [place], Marzella Schlein, MD   9 months ago Sexual arousal disorder   [place] [facility] [place], Marzella Schlein, MD   10 months ago Lateral epicondylitis of left elbow   [facility] Caro Laroche, DO       Future Appointments             In 3 months Bacigalupo, Marzella Schlein, MD [facility], [facility]

## 2023-03-29 DIAGNOSIS — H5213 Myopia, bilateral: Secondary | ICD-10-CM | POA: Diagnosis not present

## 2023-03-31 ENCOUNTER — Other Ambulatory Visit: Payer: Self-pay | Admitting: Family Medicine

## 2023-03-31 DIAGNOSIS — G5603 Carpal tunnel syndrome, bilateral upper limbs: Secondary | ICD-10-CM | POA: Diagnosis not present

## 2023-03-31 DIAGNOSIS — M545 Low back pain, unspecified: Secondary | ICD-10-CM

## 2023-03-31 DIAGNOSIS — M62830 Muscle spasm of back: Secondary | ICD-10-CM

## 2023-03-31 NOTE — Telephone Encounter (Signed)
Requested Prescriptions  Pending Prescriptions Disp Refills   baclofen (LIORESAL) 10 MG tablet [Pharmacy Med Name: BACLOFEN 10 MG TABLET] 270 tablet 0    Sig: TAKE 1 TABLET BY MOUTH THREE TIMES A DAY AS NEEDED FOR MUSCLE SPASMS     Analgesics:  Muscle Relaxants - baclofen Failed - 03/31/2023 12:41 AM      Failed - Cr in normal range and within 180 days    Creat  Date Value Ref Range Status  05/13/2017 0.92 0.50 - 1.05 mg/dL Final    Comment:    For patients >18 years of age, the reference limit for Creatinine is approximately 13% higher for people identified as African-American. .    Creatinine, Ser  Date Value Ref Range Status  01/14/2023 1.07 (H) 0.57 - 1.00 mg/dL Final         Passed - eGFR is 30 or above and within 180 days    GFR, Est African American  Date Value Ref Range Status  05/13/2017 80 > OR = 60 mL/min/1.87m2 Final   GFR calc Af Amer  Date Value Ref Range Status  07/03/2020 75 >59 mL/min/1.73 Final    Comment:    **In accordance with recommendations from the NKF-ASN Task force,**   [facility] is in the process of updating its eGFR calculation to the   2021 CKD-EPI creatinine equation that estimates kidney function   without a race variable.    GFR, Est Non African American  Date Value Ref Range Status  05/13/2017 69 > OR = 60 mL/min/1.86m2 Final   GFR calc non Af Amer  Date Value Ref Range Status  07/03/2020 65 >59 mL/min/1.73 Final   eGFR  Date Value Ref Range Status  01/14/2023 58 (L) >59 mL/min/1.73 Final         Passed - Valid encounter within last 6 months    Recent Outpatient Visits           2 months ago Encounter for annual physical exam   [place] [facility] [place], Marzella Schlein, MD   4 months ago Right-sided low back pain without sciatica, unspecified chronicity   [place] [facility] [place], Monico Blitz, DO   6 months ago Depression, recurrent [facility])   [place] [facility],  Marzella Schlein, MD   9 months ago Sexual arousal disorder   [place] [facility] [place], Marzella Schlein, MD   10 months ago Lateral epicondylitis of left elbow   [facility] Caro Laroche, DO       Future Appointments             In 3 months Bacigalupo, Marzella Schlein, MD [facility], [facility]

## 2023-04-05 ENCOUNTER — Other Ambulatory Visit: Payer: Self-pay | Admitting: Family Medicine

## 2023-04-06 NOTE — Telephone Encounter (Signed)
Request is too soon, last refill 03/04/23. Duplicate request.  Requested Prescriptions  Pending Prescriptions Disp Refills   buPROPion (WELLBUTRIN XL) 300 MG 24 hr tablet [Pharmacy Med Name: BUPROPION HCL XL 300 MG TABLET] 90 tablet 3    Sig: TAKE 1 TABLET BY MOUTH EVERY DAY     Psychiatry: Antidepressants - bupropion Failed - 04/05/2023 12:30 PM      Failed - Cr in normal range and within 360 days    Creat  Date Value Ref Range Status  05/13/2017 0.92 0.50 - 1.05 mg/dL Final    Comment:    For patients >71 years of age, the reference limit for Creatinine is approximately 13% higher for people identified as African-American. .    Creatinine, Ser  Date Value Ref Range Status  01/14/2023 1.07 (H) 0.57 - 1.00 mg/dL Final         Passed - AST in normal range and within 360 days    AST  Date Value Ref Range Status  01/14/2023 22 0 - 40 IU/L Final         Passed - ALT in normal range and within 360 days    ALT  Date Value Ref Range Status  01/14/2023 15 0 - 32 IU/L Final         Passed - Completed PHQ-2 or PHQ-9 in the last 360 days      Passed - Last BP in normal range    BP Readings from Last 1 Encounters:  02/17/23 116/62         Passed - Valid encounter within last 6 months    Recent Outpatient Visits           2 months ago Encounter for annual physical exam   [place] [facility] [place], Marzella Schlein, MD   4 months ago Right-sided low back pain without sciatica, unspecified chronicity   [place] [facility] [place], Monico Blitz, DO   6 months ago Depression, recurrent [facility])   [place] [facility] [place], Marzella Schlein, MD   9 months ago Sexual arousal disorder   [place] [facility] [place], Marzella Schlein, MD   10 months ago Lateral epicondylitis of left elbow   [facility] Caro Laroche, DO       Future Appointments             In 3 months Bacigalupo, Marzella Schlein,  MD [facility], [facility]

## 2023-05-06 ENCOUNTER — Other Ambulatory Visit: Payer: Self-pay | Admitting: Family Medicine

## 2023-05-06 NOTE — Telephone Encounter (Signed)
Request is too soon. Last refill 03/04/23 for 90 and 3 refills.  Requested Prescriptions  Pending Prescriptions Disp Refills   buPROPion (WELLBUTRIN XL) 300 MG 24 hr tablet [Pharmacy Med Name: BUPROPION HCL XL 300 MG TABLET] 90 tablet 3    Sig: TAKE 1 TABLET BY MOUTH EVERY DAY     Psychiatry: Antidepressants - bupropion Failed - 05/06/2023 10:31 AM      Failed - Cr in normal range and within 360 days    Creat  Date Value Ref Range Status  05/13/2017 0.92 0.50 - 1.05 mg/dL Final    Comment:    For patients >69 years of age, the reference limit for Creatinine is approximately 13% higher for people identified as African-American. .    Creatinine, Ser  Date Value Ref Range Status  01/14/2023 1.07 (H) 0.57 - 1.00 mg/dL Final         Passed - AST in normal range and within 360 days    AST  Date Value Ref Range Status  01/14/2023 22 0 - 40 IU/L Final         Passed - ALT in normal range and within 360 days    ALT  Date Value Ref Range Status  01/14/2023 15 0 - 32 IU/L Final         Passed - Completed PHQ-2 or PHQ-9 in the last 360 days      Passed - Last BP in normal range    BP Readings from Last 1 Encounters:  02/17/23 116/62         Passed - Valid encounter within last 6 months    Recent Outpatient Visits           3 months ago Encounter for annual physical exam   [place] [facility] [place], Marzella Schlein, MD   5 months ago Right-sided low back pain without sciatica, unspecified chronicity   [place] [facility], Monico Blitz, DO   7 months ago Depression, recurrent [facility])   [place] [facility] [employer], Marzella Schlein, MD   10 months ago Sexual arousal disorder   [facility] [place], Marzella Schlein, MD   11 months ago Lateral epicondylitis of left elbow   [facility] Caro Laroche, DO       Future Appointments             In 2 months Bacigalupo, Marzella Schlein, MD [facility], [facility]

## 2023-05-16 DIAGNOSIS — M180 Bilateral primary osteoarthritis of first carpometacarpal joints: Secondary | ICD-10-CM | POA: Diagnosis not present

## 2023-05-29 IMAGING — CR DG CERVICAL SPINE 2 OR 3 VIEWS
1 series · 4 of 4 positions shown · non-contrast
Comparison: None Available.

CLINICAL DATA: Left-sided neck pain.  No known injury

EXAM:
CERVICAL SPINE - 2-3 VIEW

[Series 1: dg cervical spine 2 or 3 views · 0.14mm/px · 4 of 4 slices shown]
[im 1/4]
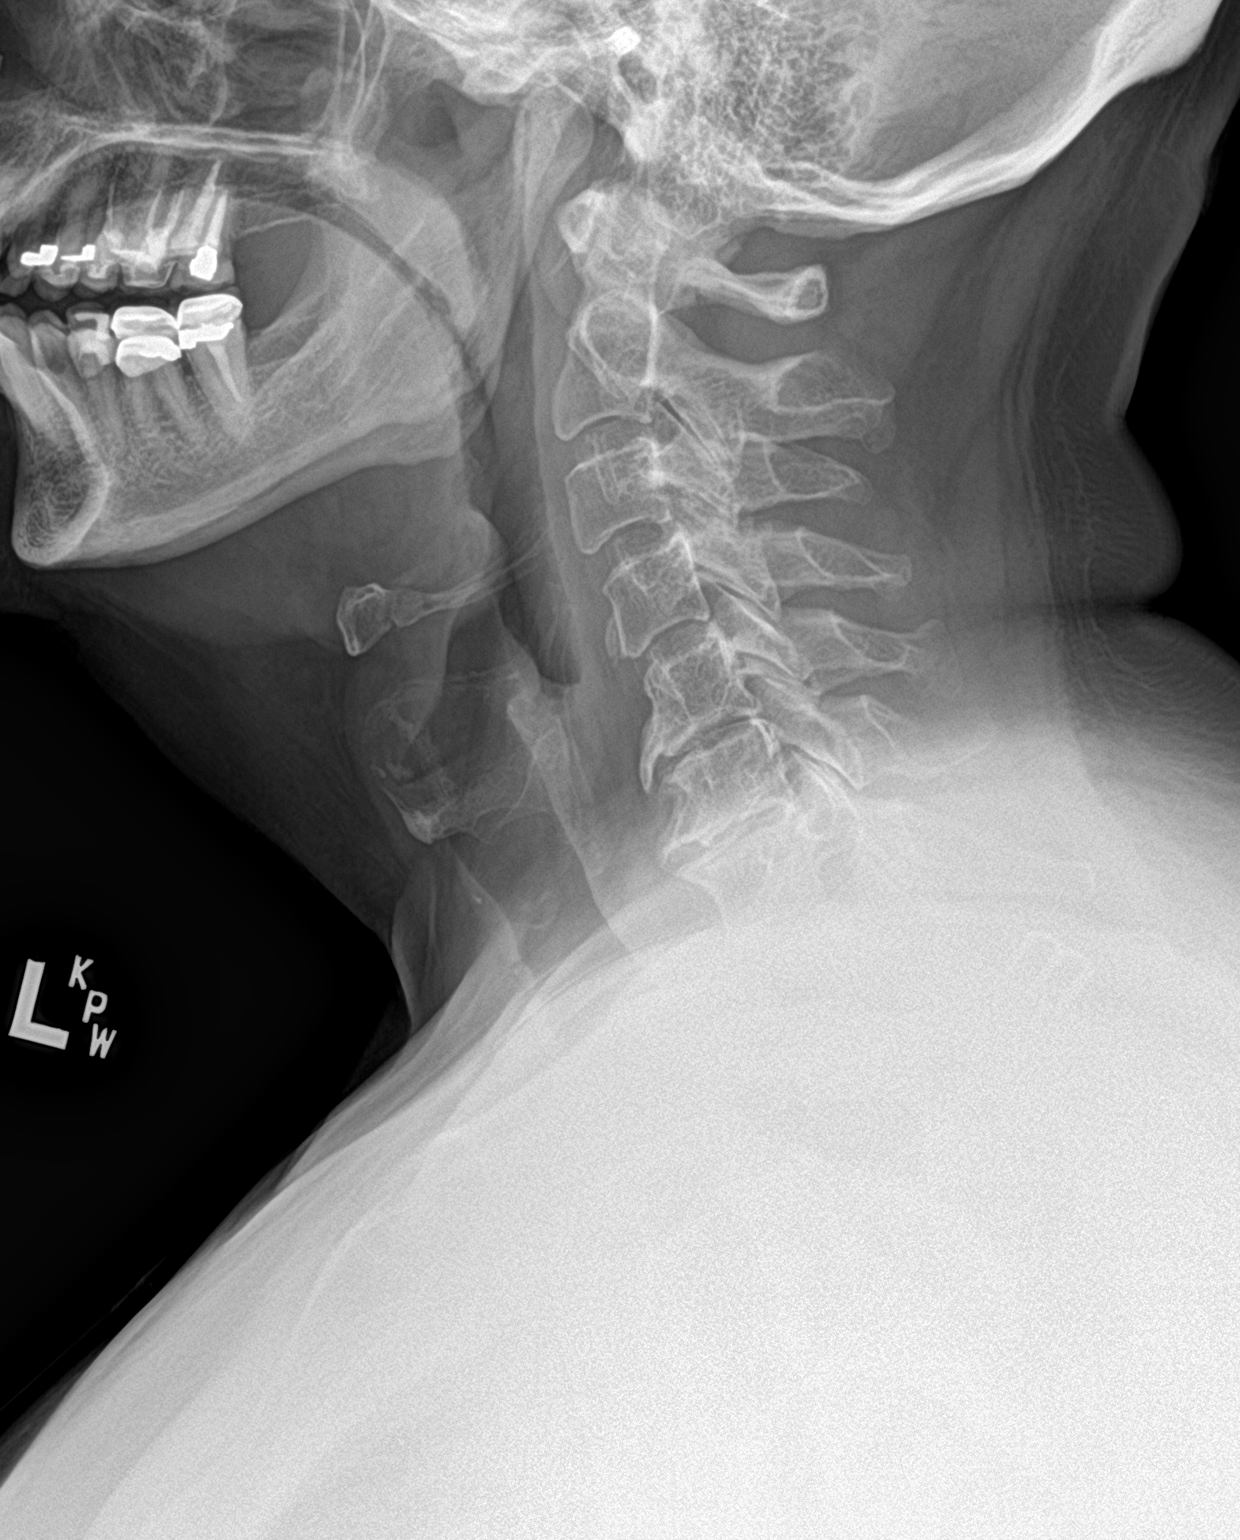
[im 2/4]
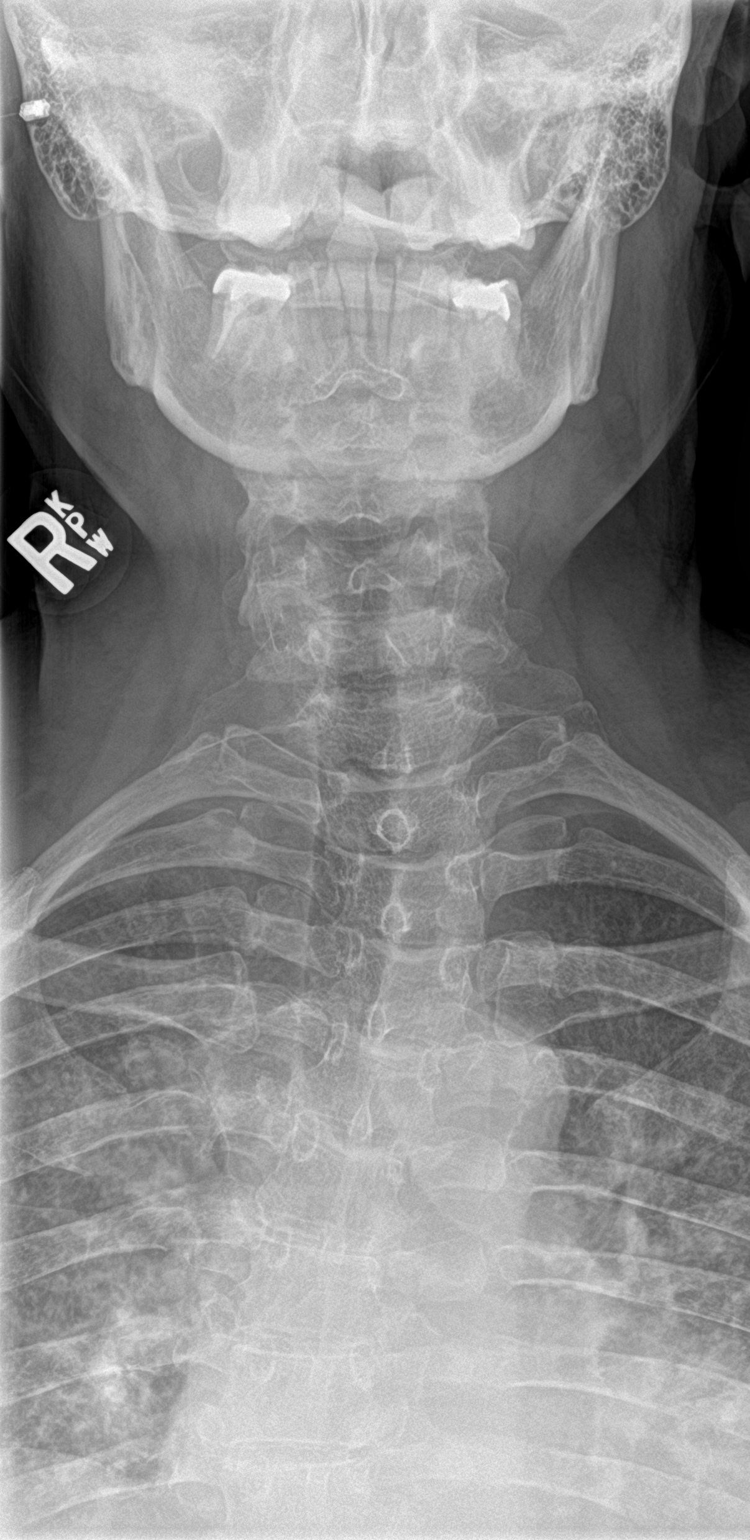
[im 3/4]
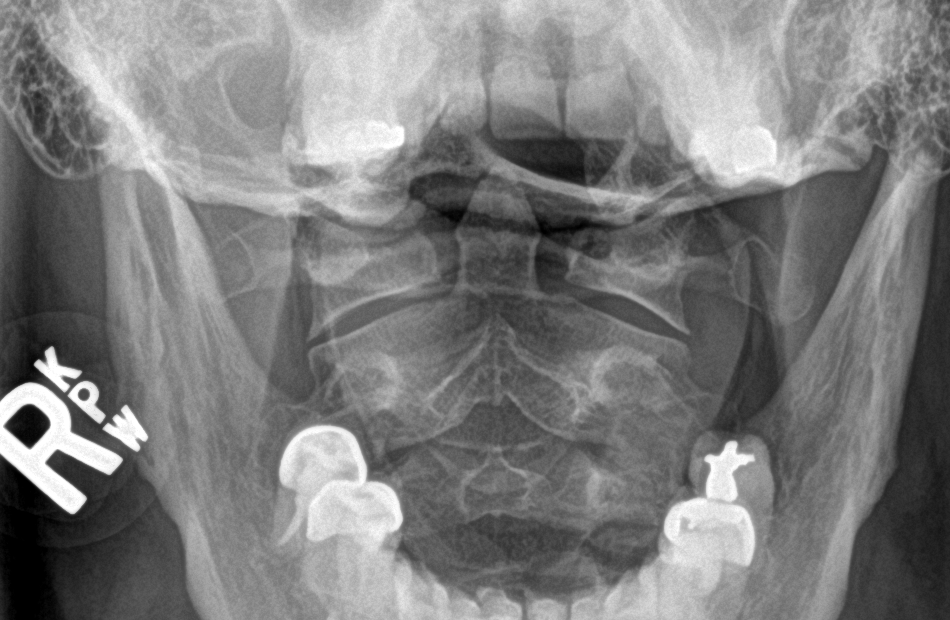
[im 4/4]
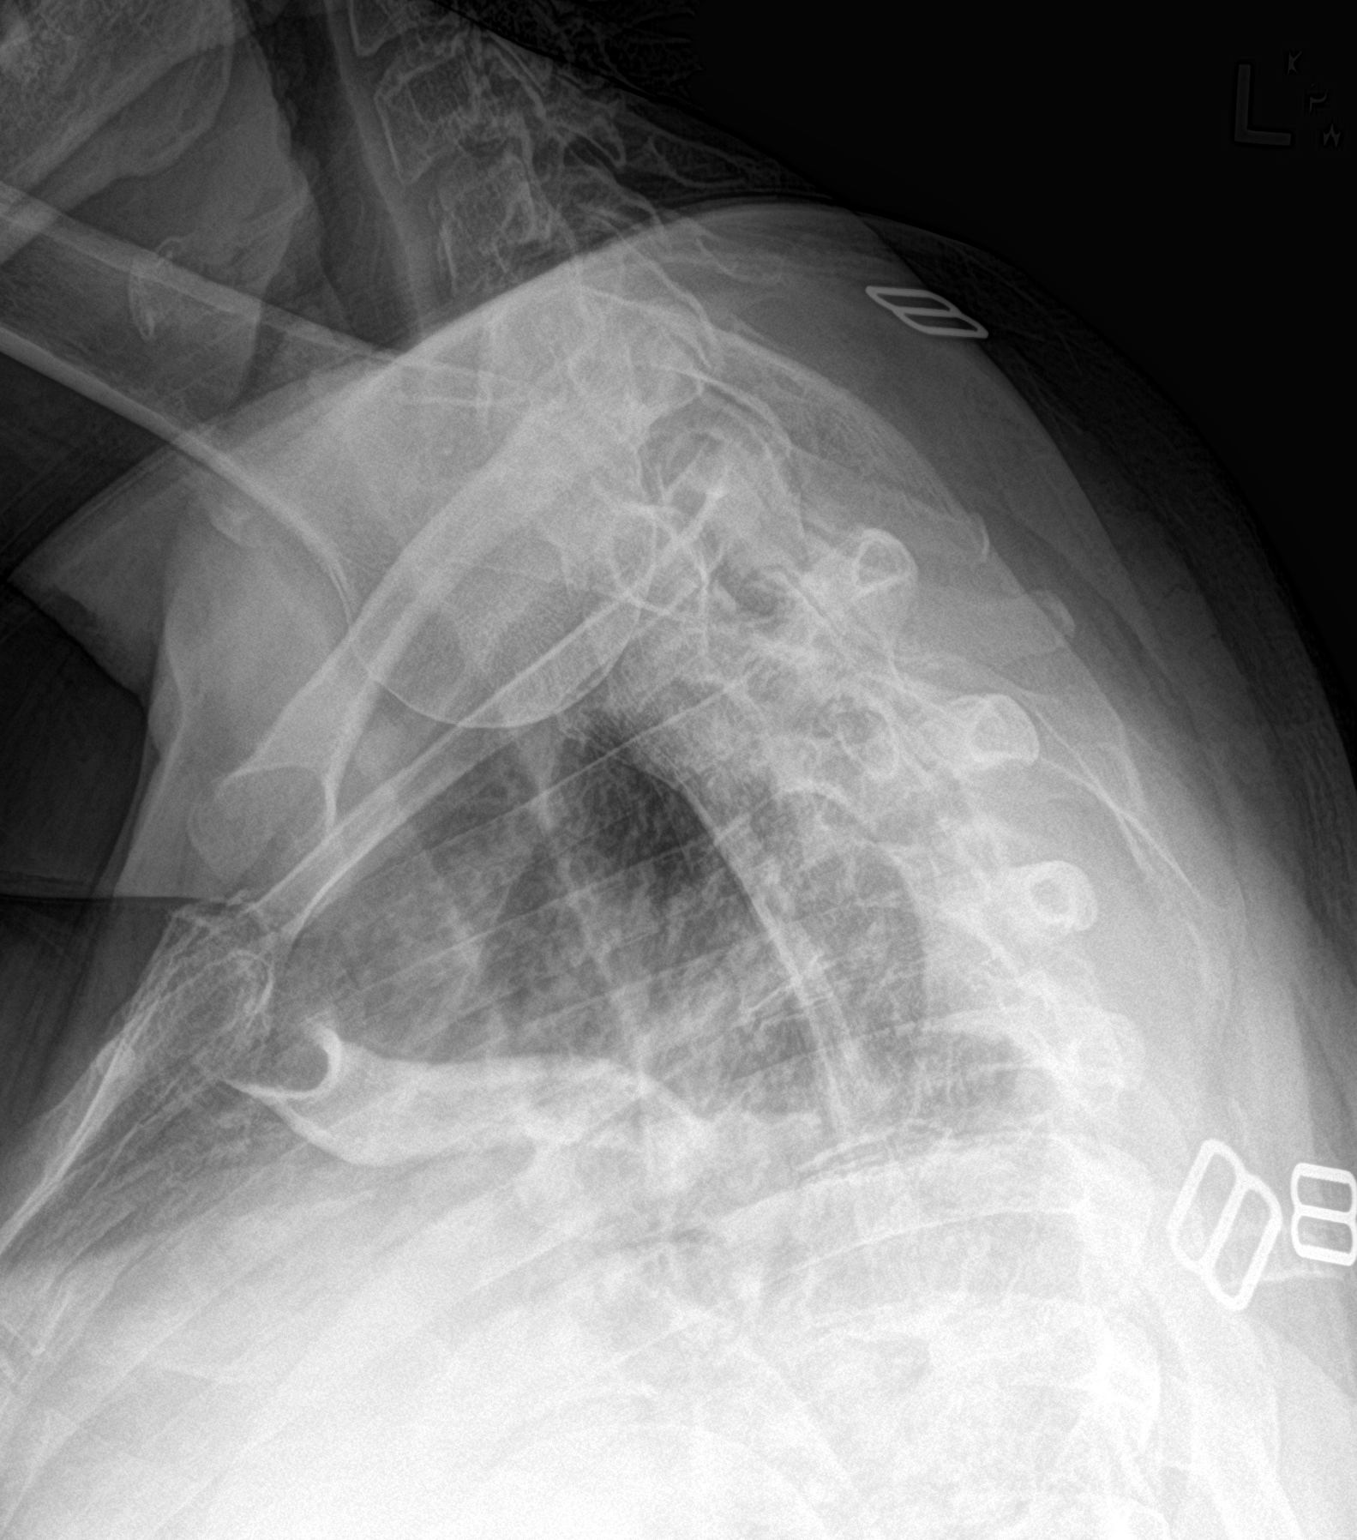

[4 of 4 positions shown; findings below may reference images not displayed]

FINDINGS: There is no evidence of cervical spine fracture or prevertebral soft
tissue swelling. Alignment is normal. Disc height loss with endplate
spurring at C5-6 and C6-7. Mild multilevel facet arthropathy.
IMPRESSION: 1. No acute osseous abnormality of the cervical spine.
2. Moderate degenerative disc disease at C5-6 and C6-7.

## 2023-06-01 ENCOUNTER — Other Ambulatory Visit: Payer: Self-pay | Admitting: Family Medicine

## 2023-06-02 NOTE — Telephone Encounter (Signed)
Requested medication (s) are due for refill today: No  Requested medication (s) are on the active medication list: Yes  Last refill:  03/04/23  Future visit scheduled: Yes  Notes to clinic:  See request.    Requested Prescriptions  Pending Prescriptions Disp Refills   buPROPion (WELLBUTRIN XL) 300 MG 24 hr tablet [Pharmacy Med Name: BUPROPION HCL XL 300 MG TABLET] 90 tablet 3    Sig: TAKE 1 TABLET BY MOUTH EVERY DAY     Psychiatry: Antidepressants - bupropion Failed - 06/02/2023  9:30 AM      Failed - Cr in normal range and within 360 days    Creat  Date Value Ref Range Status  05/13/2017 0.92 0.50 - 1.05 mg/dL Final    Comment:    For patients >29 years of age, the reference limit for Creatinine is approximately 13% higher for people identified as African-American. .    Creatinine, Ser  Date Value Ref Range Status  01/14/2023 1.07 (H) 0.57 - 1.00 mg/dL Final         Passed - AST in normal range and within 360 days    AST  Date Value Ref Range Status  01/14/2023 22 0 - 40 IU/L Final         Passed - ALT in normal range and within 360 days    ALT  Date Value Ref Range Status  01/14/2023 15 0 - 32 IU/L Final         Passed - Completed PHQ-2 or PHQ-9 in the last 360 days      Passed - Last BP in normal range    BP Readings from Last 1 Encounters:  02/17/23 116/62         Passed - Valid encounter within last 6 months    Recent Outpatient Visits           4 months ago Encounter for annual physical exam   [place] [facility] [place], Marzella Schlein, MD   6 months ago Right-sided low back pain without sciatica, unspecified chronicity   [place] [facility] [place], Monico Blitz, DO   8 months ago Depression, recurrent [facility])   [place] [facility], Marzella Schlein, MD   11 months ago Sexual arousal disorder   [place] [facility] [place], Marzella Schlein, MD   1 year ago Lateral epicondylitis of  left elbow   [place] [facility] Caro Laroche, DO       Future Appointments             In 1 month Bacigalupo, Marzella Schlein, MD [facility], [facility]

## 2023-06-02 NOTE — Telephone Encounter (Signed)
She is on 450 mg daily of Wellbutrin XL - hence the 2 different Rxs for 300 and 150 mg doses. Just FYI

## 2023-06-08 DIAGNOSIS — M5416 Radiculopathy, lumbar region: Secondary | ICD-10-CM | POA: Diagnosis not present

## 2023-06-14 ENCOUNTER — Other Ambulatory Visit: Payer: Self-pay | Admitting: Family Medicine

## 2023-06-14 DIAGNOSIS — F32A Depression, unspecified: Secondary | ICD-10-CM

## 2023-06-28 ENCOUNTER — Encounter: Payer: Self-pay | Admitting: Family Medicine

## 2023-07-04 DIAGNOSIS — H2513 Age-related nuclear cataract, bilateral: Secondary | ICD-10-CM | POA: Diagnosis not present

## 2023-07-04 DIAGNOSIS — H40009 Preglaucoma, unspecified, unspecified eye: Secondary | ICD-10-CM | POA: Diagnosis not present

## 2023-07-13 DIAGNOSIS — M5416 Radiculopathy, lumbar region: Secondary | ICD-10-CM | POA: Diagnosis not present

## 2023-07-18 ENCOUNTER — Ambulatory Visit: Payer: 59 | Admitting: Family Medicine

## 2023-07-28 ENCOUNTER — Encounter: Payer: Self-pay | Admitting: Family Medicine

## 2023-07-28 ENCOUNTER — Ambulatory Visit (INDEPENDENT_AMBULATORY_CARE_PROVIDER_SITE_OTHER): Payer: 59 | Admitting: Family Medicine

## 2023-07-28 VITALS — BP 122/64 | HR 64 | Ht 63.0 in | Wt 239.6 lb

## 2023-07-28 DIAGNOSIS — Z6841 Body Mass Index (BMI) 40.0 and over, adult: Secondary | ICD-10-CM

## 2023-07-28 DIAGNOSIS — R7303 Prediabetes: Secondary | ICD-10-CM | POA: Diagnosis not present

## 2023-07-28 DIAGNOSIS — F339 Major depressive disorder, recurrent, unspecified: Secondary | ICD-10-CM | POA: Diagnosis not present

## 2023-07-28 LAB — POCT GLYCOSYLATED HEMOGLOBIN (HGB A1C): Hemoglobin A1C: 5.7 % — AB (ref 4.0–5.6)

## 2023-07-28 NOTE — Patient Instructions (Addendum)
 Call [facility] to schedule a mammogram [phone]  (check and see if you have documentation from one in 2024)

## 2023-07-28 NOTE — Assessment & Plan Note (Signed)
 Recent A1c improved from 6.1 to 5.7, indicating good control. Weight loss of 11 pounds since July likely contributing to improved glycemic control. Discussed benefits of weight loss and healthy diet. - Continue current management - Monitor A1c levels regularly - Encourage continued weight management and healthy diet

## 2023-07-28 NOTE — Assessment & Plan Note (Signed)
 Reports improvement with current medications: Wellbutrin  XL 450 mg and Zoloft  100 mg daily. External stressors, particularly related to her son's substance use and behavioral issues, are contributing to her mood. Discussed importance of ongoing therapy for her son. - Continue current medications - Encourage ongoing therapy - she is currently looking for a therapist for herself

## 2023-07-28 NOTE — Progress Notes (Signed)
 Established patient visit   Patient: Rachael Brewer   DOB: 02-21-1961   63 y.o. Female  MRN: [id number] Visit Date: 07/28/2023  Today's healthcare provider: Jon Eva, MD   Chief Complaint  Patient presents with   Medical Management of Chronic Issues   Diabetes    Patient reports not checking at home Symptoms: Excessive thirst, anxiety   Subjective    HPI HPI     Diabetes    Additional comments: Patient reports not checking at home Symptoms: Excessive thirst, anxiety      Last edited by Lilian Fitzpatrick, CMA on 07/28/2023 11:04 AM.       Discussed the use of AI scribe software for clinical note transcription with the patient, who gave verbal consent to proceed.  History of Present Illness   The patient, with a history of prediabetes and depression, presents for a routine check-up. She reports a weight loss of 11 pounds since her last visit, which she attributes to healthier eating habits and waiting until she feels hungry to eat. She also mentions that she has been experiencing joint pain, which is being managed with meloxicam prescribed by Dr. Cleotilde. The patient also reports memory issues, which she finds embarrassing and frustrating.  In addition to her physical health, the patient discusses the stress and worry related to her son's substance abuse and mental health issues. Her son is reportedly using marijuana, vaping, and abusing Delsun cough syrup for its DM content. The patient expresses concern about her son's lack of insight into the dangers of his substance use and the impact on his developing brain. She also mentions her son's struggles with anxiety, depression, and grief, and that he is currently receiving therapy twice a week.  The patient also mentions the difficulty of dealing with these issues without the support of her late husband, who she describes as having been a good team with her in managing their son's issues. She expresses a sense of loss  and struggle in dealing with her son's issues without her husband's support.          01/14/2023   10:28 AM 09/30/2022   10:45 AM 06/24/2022    3:02 PM 03/29/2022    1:13 PM 01/13/2022    9:12 AM  Depression screen PHQ 2/9  Decreased Interest 1 2  3 2   Down, Depressed, Hopeless 1 1 1 2 1   PHQ - 2 Score 2 3 1 5 3   Altered sleeping 3 1 0 1 1  Tired, decreased energy 2 1 3 2 3   Change in appetite 3 3 2 3 2   Feeling bad or failure about yourself  1 1 1 1 3   Trouble concentrating 0 0 0 0 0  Moving slowly or fidgety/restless 0 0 0 0 0  Suicidal thoughts 0 0 0 0 0  PHQ-9 Score 11 9 7 12 12   Difficult doing work/chores Somewhat difficult Somewhat difficult Very difficult Very difficult      Medications: Outpatient Medications Prior to Visit  Medication Sig   albuterol  (VENTOLIN  HFA) 108 (90 Base) MCG/ACT inhaler INHALE 1-2 PUFFS BY MOUTH EVERY 6 HOURS AS NEEDED FOR WHEEZE OR SHORTNESS OF BREATH   aspirin -acetaminophen -caffeine  (EXCEDRIN  MIGRAINE) 250-250-65 MG tablet Take 2 tablets by mouth every 8 (eight) hours as needed for migraine.    Azelastine  HCl 137 MCG/SPRAY SOLN Place 1 spray into both nostrils at bedtime.   baclofen  (LIORESAL ) 10 MG tablet TAKE 1 TABLET BY MOUTH THREE TIMES A  DAY AS NEEDED FOR MUSCLE SPASMS   buPROPion  (WELLBUTRIN  XL) 150 MG 24 hr tablet TAKE 1 TABLET BY MOUTH EVERY DAY   buPROPion  (WELLBUTRIN  XL) 300 MG 24 hr tablet TAKE 1 TABLET BY MOUTH EVERY DAY   clobetasol  cream (TEMOVATE ) 0.05 % Apply 1 Application topically 2 (two) times daily.   fluticasone  (FLONASE ) 50 MCG/ACT nasal spray Place 2 sprays into both nostrils daily.   gabapentin  (NEURONTIN ) 600 MG tablet TAKE 1 TABLET BY MOUTH TWICE A DAY   meloxicam (MOBIC) 15 MG tablet Take 1 tablet by mouth daily.   Multiple Vitamin (MULTIVITAMIN WITH MINERALS) TABS tablet Take 1 tablet by mouth daily.   omeprazole  (PRILOSEC) 40 MG capsule TAKE 1 CAPSULE (40 MG TOTAL) BY MOUTH DAILY.   rizatriptan  (MAXALT ) 10 MG tablet  Take 10 mg by mouth every 2 (two) hours as needed for migraine (max 2 tablets/24 hrs.).    sertraline  (ZOLOFT ) 100 MG tablet TAKE 1 TABLET BY MOUTH EVERY DAY   torsemide  (DEMADEX ) 20 MG tablet TAKE 2 TABLETS (40 MG TOTAL) BY MOUTH DAILY.   triamcinolone  ointment (KENALOG ) 0.5 % Apply 1 Application topically 2 (two) times daily.   No facility-administered medications prior to visit.    Review of Systems     Objective    BP 122/64 (BP Location: Left Arm, Patient Position: Sitting, Cuff Size: Large)   Pulse 64   Ht 5' 3 (1.6 m)   Wt 239 lb 9.6 oz (108.7 kg)   LMP  (LMP Unknown)   SpO2 97%   BMI 42.44 kg/m    Physical Exam Vitals reviewed.  Constitutional:      General: She is not in acute distress.    Appearance: Normal appearance. She is well-developed. She is not diaphoretic.  HENT:     Head: Normocephalic and atraumatic.  Eyes:     General: No scleral icterus.    Conjunctiva/sclera: Conjunctivae normal.  Neck:     Thyroid : No thyromegaly.  Cardiovascular:     Rate and Rhythm: Normal rate and regular rhythm.     Heart sounds: Normal heart sounds. No murmur heard. Pulmonary:     Effort: Pulmonary effort is normal. No respiratory distress.     Breath sounds: Normal breath sounds. No wheezing, rhonchi or rales.  Musculoskeletal:     Cervical back: Neck supple.     Right lower leg: No edema.     Left lower leg: No edema.  Lymphadenopathy:     Cervical: No cervical adenopathy.  Skin:    General: Skin is warm and dry.     Findings: No rash.  Neurological:     Mental Status: She is alert and oriented to person, place, and time. Mental status is at baseline.  Psychiatric:        Mood and Affect: Mood normal.        Behavior: Behavior normal.      Results for orders placed or performed in visit on 07/28/23  POCT HgB A1C  Result Value Ref Range   Hemoglobin A1C 5.7 (A) 4.0 - 5.6 %   HbA1c POC (<> result, manual entry)     HbA1c, POC (prediabetic range)     HbA1c,  POC (controlled diabetic range)      Assessment & Plan     Problem List Items Addressed This Visit       Other   Depression, recurrent (HCC)   Reports improvement with current medications: Wellbutrin  XL 450 mg and Zoloft  100 mg daily.  External stressors, particularly related to her son's substance use and behavioral issues, are contributing to her mood. Discussed importance of ongoing therapy for her son. - Continue current medications - Encourage ongoing therapy - she is currently looking for a therapist for herself      Morbid obesity (HCC)   Discussed importance of healthy weight management Discussed diet and exercise       Prediabetes - Primary   Recent A1c improved from 6.1 to 5.7, indicating good control. Weight loss of 11 pounds since July likely contributing to improved glycemic control. Discussed benefits of weight loss and healthy diet. - Continue current management - Monitor A1c levels regularly - Encourage continued weight management and healthy diet      Relevant Orders   POCT HgB A1C (Completed)        Elevated BP Initial blood pressure slightly elevated but normalized to 122/64 upon recheck. Blood pressure may be influenced by physical activity prior to measurement. Discussed importance of regular monitoring. - Recheck blood pressure at the end of the visit - Continue monitoring blood pressure regularly  Chronic Pain On gabapentin  600 mg twice daily for nerve pain and meloxicam for joint pain, both effective. Under the care of Dr. Cleotilde for joint and back pain. Discussed benefits of current pain management regimen and importance of follow-up with Dr. Cleotilde. - Continue gabapentin  600 mg twice daily per Neuro - Continue meloxicam as prescribed by Dr. Cleotilde - Follow up with Dr. Cleotilde for joint and back pain management  General Health Maintenance Mammogram status unclear. Believes she had one in November or December 2024, but it is not documented. Memory  issues noted, possibly due to stress. Discussed importance of regular mammograms and need to confirm recent imaging. - Call [facility] to confirm recent mammogram - Schedule a mammogram if not done recently  Follow-up - Follow up in six months for physical - [facility] with [facility] regarding mammogram - Continue current medications and management plans.          Return in about 6 months (around 01/25/2024) for CPE.       Jon Eva, MD  [facility] [phone] (phone) [phone] (fax)  [facility]

## 2023-07-28 NOTE — Assessment & Plan Note (Signed)
 Discussed importance of healthy weight management Discussed diet and exercise

## 2023-07-30 ENCOUNTER — Other Ambulatory Visit: Payer: Self-pay | Admitting: Family Medicine

## 2023-07-30 DIAGNOSIS — M62830 Muscle spasm of back: Secondary | ICD-10-CM

## 2023-07-30 DIAGNOSIS — M545 Low back pain, unspecified: Secondary | ICD-10-CM

## 2023-08-01 NOTE — Telephone Encounter (Signed)
 Needs sooner appt

## 2023-08-01 NOTE — Telephone Encounter (Signed)
 Attempted to call patient- no answer- mailbox full. Courtesy 30 day Rx sent with pharmacy note. Requested Prescriptions  Pending Prescriptions Disp Refills   sertraline  (ZOLOFT ) 100 MG tablet [Pharmacy Med Name: SERTRALINE  HCL 100 MG TABLET] 30 tablet 2    Sig: TAKE 1 TABLET BY MOUTH EVERY DAY     Psychiatry:  Antidepressants - SSRI - sertraline  Failed - 08/01/2023  2:13 PM      Failed - Valid encounter within last 6 months    Recent Outpatient Visits           6 months ago Encounter for annual physical exam   [place] [facility] [place], Stan Eans, MD   8 months ago Right-sided low back pain without sciatica, unspecified chronicity   [place] [facility] [place], Asencion Blacksmith, DO   10 months ago Depression, recurrent [facility])   [place] [facility] [place], Stan Eans, MD   1 year ago Sexual arousal disorder   [place] [facility] [place], Stan Eans, MD   1 year ago Lateral epicondylitis of left elbow   [place] [facility] Kandis Ormond, DO       Future Appointments             In 5 months Bacigalupo, Stan Eans, MD [facility]            Passed - AST in normal range and within 360 days    AST  Date Value Ref Range Status  01/14/2023 22 0 - 40 IU/L Final         Passed - ALT in normal range and within 360 days    ALT  Date Value Ref Range Status  01/14/2023 15 0 - 32 IU/L Final         Passed - Completed PHQ-2 or PHQ-9 in the last 360 days       baclofen  (LIORESAL ) 10 MG tablet [Pharmacy Med Name: BACLOFEN  10 MG TABLET] 90 tablet 2    Sig: TAKE 1 TABLET BY MOUTH THREE TIMES A DAY AS NEEDED FOR MUSCLE SPASM     Analgesics:  Muscle Relaxants - baclofen  Failed - 08/01/2023  2:13 PM      Failed - Cr in normal range and within 180 days    Creat  Date Value Ref Range Status  05/13/2017 0.92 0.50 - 1.05 mg/dL Final    Comment:    For patients >42  years of age, the reference limit for Creatinine is approximately 13% higher for people identified as African-American. .    Creatinine, Ser  Date Value Ref Range Status  01/14/2023 1.07 (H) 0.57 - 1.00 mg/dL Final         Failed - eGFR is 30 or above and within 180 days    GFR, Est African American  Date Value Ref Range Status  05/13/2017 80 > OR = 60 mL/min/1.54m2 Final   GFR calc Af Amer  Date Value Ref Range Status  07/03/2020 75 >59 mL/min/1.73 Final    Comment:    **In accordance with recommendations from the NKF-ASN Task force,**   [facility] is in the process of updating its eGFR calculation to the   2021 CKD-EPI creatinine equation that estimates kidney function   without a race variable.    GFR, Est Non African American  Date Value Ref Range Status  05/13/2017 69 > OR = 60 mL/min/1.75m2 Final   GFR calc non Af Zara Heymann  Date Value Ref Range Status  07/03/2020 65 >59 mL/min/1.73 Final   eGFR  Date Value Ref Range Status  01/14/2023 58 (L) >59 mL/min/1.73 Final         Failed - Valid encounter within last 6 months    Recent Outpatient Visits           6 months ago Encounter for annual physical exam   [place] [facility] [place], Stan Eans, MD   8 months ago Right-sided low back pain without sciatica, unspecified chronicity   [place] [facility] [place], Asencion Blacksmith, DO   10 months ago Depression, recurrent [facility])   [place] [facility] [place], Stan Eans, MD   1 year ago Sexual arousal disorder    [facility] [place], Stan Eans, MD   1 year ago Lateral epicondylitis of left elbow   [facility] Kandis Ormond, DO       Future Appointments             In 5 months Bacigalupo, Stan Eans, MD [facility], [facility]

## 2023-08-04 DIAGNOSIS — F331 Major depressive disorder, recurrent, moderate: Secondary | ICD-10-CM | POA: Diagnosis not present

## 2023-08-04 DIAGNOSIS — M7541 Impingement syndrome of right shoulder: Secondary | ICD-10-CM | POA: Diagnosis not present

## 2023-08-04 DIAGNOSIS — M7542 Impingement syndrome of left shoulder: Secondary | ICD-10-CM | POA: Diagnosis not present

## 2023-08-17 DIAGNOSIS — J301 Allergic rhinitis due to pollen: Secondary | ICD-10-CM | POA: Diagnosis not present

## 2023-08-17 DIAGNOSIS — H6983 Other specified disorders of Eustachian tube, bilateral: Secondary | ICD-10-CM | POA: Diagnosis not present

## 2023-08-17 DIAGNOSIS — H93A2 Pulsatile tinnitus, left ear: Secondary | ICD-10-CM | POA: Diagnosis not present

## 2023-08-17 DIAGNOSIS — H903 Sensorineural hearing loss, bilateral: Secondary | ICD-10-CM | POA: Diagnosis not present

## 2023-08-17 DIAGNOSIS — H9312 Tinnitus, left ear: Secondary | ICD-10-CM | POA: Diagnosis not present

## 2023-08-31 ENCOUNTER — Other Ambulatory Visit: Payer: Self-pay | Admitting: Family Medicine

## 2023-08-31 DIAGNOSIS — M62830 Muscle spasm of back: Secondary | ICD-10-CM

## 2023-08-31 DIAGNOSIS — M545 Low back pain, unspecified: Secondary | ICD-10-CM

## 2023-08-31 NOTE — Telephone Encounter (Signed)
 Last OV 07/28/23 with protocol.  Requested Prescriptions  Pending Prescriptions Disp Refills   sertraline (ZOLOFT) 100 MG tablet [Pharmacy Med Name: SERTRALINE HCL 100 MG TABLET] 90 tablet 0    Sig: TAKE 1 TABLET BY MOUTH EVERY DAY     Psychiatry:  Antidepressants - SSRI - sertraline Failed - 08/31/2023  1:57 PM      Failed - Valid encounter within last 6 months    Recent Outpatient Visits           7 months ago Encounter for annual physical exam   [place] [facility] [place], Rachael Schlein, MD   9 months ago Right-sided low back pain without sciatica, unspecified chronicity   [place] [facility] [place], Monico Blitz, DO   11 months ago Depression, recurrent [facility])   [place] [facility] [place], Rachael Schlein, MD   1 year ago Sexual arousal disorder   [place] [facility] [place], Rachael Schlein, MD   1 year ago Lateral epicondylitis of left elbow   [place] [facility] Caro Laroche, DO       Future Appointments             In 4 months Bacigalupo, Rachael Schlein, MD [facility], [facility]            Passed - AST in normal range and within 360 days    AST  Date Value Ref Range Status  01/14/2023 22 0 - 40 IU/L Final         Passed - ALT in normal range and within 360 days    ALT  Date Value Ref Range Status  01/14/2023 15 0 - 32 IU/L Final         Passed - Completed PHQ-2 or PHQ-9 in the last 360 days       baclofen (LIORESAL) 10 MG tablet [Pharmacy Med Name: BACLOFEN 10 MG TABLET] 270 tablet 0    Sig: TAKE 1 TABLET BY MOUTH THREE TIMES A DAY AS NEEDED FOR MUSCLE SPASM     Analgesics:  Muscle Relaxants - baclofen Failed - 08/31/2023  1:57 PM      Failed - Cr in normal range and within 180 days    Creat  Date Value Ref Range Status  05/13/2017 0.92 0.50 - 1.05 mg/dL Final    Comment:    For patients >57 years of age, the reference limit for Creatinine is  approximately 13% higher for people identified as African-American. .    Creatinine, Ser  Date Value Ref Range Status  01/14/2023 1.07 (H) 0.57 - 1.00 mg/dL Final         Failed - eGFR is 30 or above and within 180 days    GFR, Est African American  Date Value Ref Range Status  05/13/2017 80 > OR = 60 mL/min/1.83m2 Final   GFR calc Af Amer  Date Value Ref Range Status  07/03/2020 75 >59 mL/min/1.73 Final    Comment:    **In accordance with recommendations from the NKF-ASN Task force,**   [facility] is in the process of updating its eGFR calculation to the   2021 CKD-EPI creatinine equation that estimates kidney function   without a race variable.    GFR, Est Non African American  Date Value Ref Range Status  05/13/2017 69 > OR = 60 mL/min/1.19m2 Final   GFR calc non Af Amer  Date Value Ref Range Status  07/03/2020 65 >59 mL/min/1.73  Final   eGFR  Date Value Ref Range Status  01/14/2023 58 (L) >59 mL/min/1.73 Final         Failed - Valid encounter within last 6 months    Recent Outpatient Visits           7 months ago Encounter for annual physical exam   [place] [facility] [place], Rachael Schlein, MD   9 months ago Right-sided low back pain without sciatica, unspecified chronicity   [place] [facility] Sherlyn Hay, DO   11 months ago Depression, recurrent [facility])   [place] [facility] [place], Rachael Schlein, MD   1 year ago Sexual arousal disorder   [place] [facility] [place], Rachael Schlein, MD   1 year ago Lateral epicondylitis of left elbow   [facility] Caro Laroche, DO       Future Appointments             In 4 months Bacigalupo, Rachael Schlein, MD [facility], [facility]

## 2023-09-14 DIAGNOSIS — M180 Bilateral primary osteoarthritis of first carpometacarpal joints: Secondary | ICD-10-CM | POA: Diagnosis not present

## 2023-09-14 DIAGNOSIS — M7541 Impingement syndrome of right shoulder: Secondary | ICD-10-CM | POA: Diagnosis not present

## 2023-09-15 ENCOUNTER — Other Ambulatory Visit: Payer: Self-pay | Admitting: Adult Health

## 2023-09-15 ENCOUNTER — Encounter: Payer: Self-pay | Admitting: Adult Health

## 2023-09-15 DIAGNOSIS — M5416 Radiculopathy, lumbar region: Secondary | ICD-10-CM | POA: Diagnosis not present

## 2023-09-15 NOTE — Telephone Encounter (Signed)
 Done, Andrey Campanile sent pt a message.

## 2023-10-12 DIAGNOSIS — H6983 Other specified disorders of Eustachian tube, bilateral: Secondary | ICD-10-CM | POA: Diagnosis not present

## 2023-10-12 DIAGNOSIS — J301 Allergic rhinitis due to pollen: Secondary | ICD-10-CM | POA: Diagnosis not present

## 2023-10-12 DIAGNOSIS — H93A2 Pulsatile tinnitus, left ear: Secondary | ICD-10-CM | POA: Diagnosis not present

## 2023-10-14 ENCOUNTER — Ambulatory Visit: Admitting: Family Medicine

## 2023-10-17 ENCOUNTER — Encounter: Payer: Self-pay | Admitting: Podiatry

## 2023-10-17 DIAGNOSIS — S92335A Nondisplaced fracture of third metatarsal bone, left foot, initial encounter for closed fracture: Secondary | ICD-10-CM | POA: Diagnosis not present

## 2023-10-17 DIAGNOSIS — M79672 Pain in left foot: Secondary | ICD-10-CM | POA: Diagnosis not present

## 2023-10-17 DIAGNOSIS — S9032XA Contusion of left foot, initial encounter: Secondary | ICD-10-CM | POA: Diagnosis not present

## 2023-10-17 DIAGNOSIS — S90122A Contusion of left lesser toe(s) without damage to nail, initial encounter: Secondary | ICD-10-CM | POA: Diagnosis not present

## 2023-10-18 NOTE — Progress Notes (Unsigned)
 Rachael Brewer

## 2023-10-19 ENCOUNTER — Ambulatory Visit (INDEPENDENT_AMBULATORY_CARE_PROVIDER_SITE_OTHER): Payer: 59 | Admitting: Adult Health

## 2023-10-19 ENCOUNTER — Encounter: Payer: Self-pay | Admitting: Adult Health

## 2023-10-19 VITALS — BP 125/71 | HR 59 | Ht 63.0 in | Wt 233.0 lb

## 2023-10-19 DIAGNOSIS — G4733 Obstructive sleep apnea (adult) (pediatric): Secondary | ICD-10-CM

## 2023-10-19 DIAGNOSIS — G43009 Migraine without aura, not intractable, without status migrainosus: Secondary | ICD-10-CM | POA: Diagnosis not present

## 2023-10-19 NOTE — Progress Notes (Signed)
 PATIENT: Rachael Brewer DOB: April 05, 1961  REASON FOR VISIT: follow up HISTORY FROM: patient PRIMARY NEUROLOGIST: Dr. Altamese Ates  Chief Complaint  Patient presents with   Room 5    Pt is here Alone. Pt states that everything has been fair with her CPAP Machine. Pt states that she will sometimes forget to put water  in her Machine, or she will forget to put her CPAP back on when she wakes up at night and go to the bathroom and forget to put her mask back on.      HISTORY OF PRESENT ILLNESS: Today 10/19/23:  Rachael Brewer is a 63 y.o. female with a history of OSA on CPAP and migraine headaches. Returns today for follow-up. Overall she reports that her Headaches are manageable. Haivng about 2 headaches a week- usually wakes up with a headache or gets one late in the afternoon.  She is not sure if their is a correlation to her morning headaches and CPAP usage. Has remained on gabapentin  600 mg BID.Will use excedrin - resolves headaches within 20-30 minutes.CPAP is working well. Denies any issues with the machine. Has tried to use it more.   At the end of the visit- she mentioned some trouble with her memory. States that PCP is aware and did blood work?    02/17/23: Rachael Brewer is a 63 y.o. female with a history of OSA on CPAP. Returns today for follow-up.  She states that her headaches have improved since she has been consistent with CPAP.  She still struggles sometimes falling asleep at night.  She does not have a set bedtime routine.  She states that she often "snacks" a lot at bedtime.  Her download is below.   10/26/22: Rachael Brewer is a 63 y.o. female who has been followed in this office for Migraine headaches. Returns today for follow-up. Reports that she is having migraines  Every other day- Wake up with most headaches. Will take excedrin  for migraines. Weaned herself off Qudexy  as she didn't find it beneficial. Continues on Gabapentin  and baclofen .   Has a diagnosis  of obstructive sleep apnea but this is not managed by our office.  She states that she has not used her machine in over 9 months.  She also states that since she has been put on machine she has not had any regular follow-ups with a physician to review her data   HISTORY Brief HPI: 63 year old female with a history of anxiety, depression, OSA who follows in clinic for migraines. MRI brain 10/27/20 was unremarkable.   At her last visit Qudexy  was increased to 100 mg daily and continued gabapentin  for headache prevention.  Also discussed avoidance of OTC overuse     Interval History: She currently experiences migraine about every other day typically upon awakening.  She does believe headache severity has improved since increasing Qudexy  dosage.  She will occasionally experience 1 in the afternoon.  She will typically take Excedrin  with morning headache and rizatriptan  with evening headache.  She has not been using her CPAP routinely over the past few months as her house is currently under Holiday representative.  She continues to work with PT for cervicalgia and dry needling which has been greatly beneficial.  She continues to experience significant family stressors especially since the passing of her husband a year ago, believes she has been able to start the grieving process over the past 6 months.  She routinely participates with grief counseling which has been beneficial.  She does complain of memory loss, will misplace items around her home, will forget driving directions to known locations, forgot she already saw renovations done at her friend's home. Believes this has been more present over the past 6 months.        Headache days per month: 15-30 Headache free days per month: 0-15   Current Headache Regimen: Preventative: Qudexy  100 mg daily, gabapentin  600 mg BID Abortive: Excedrin , Maxalt  10 mg 7PRN, baclofen  PRN   Prior Therapies                                  Rescue: Excedrin  BC  powder Cambia Frovatriptan Imitrex  Maxalt  Relpax Zomig Reglan  Baclofen   Flexeril  Preventive: Gabapentin  Topamax  Citalopram Escitalopram  Sertraline  Paroxetine Occipital nerve block Trigger point injections  REVIEW OF SYSTEMS: Out of a complete 14 system review of symptoms, the patient complains only of the following symptoms, and all other reviewed systems are negative.  ESS 9   ALLERGIES: Allergies  Allergen Reactions   Bactrim [Sulfamethoxazole-Trimethoprim] Shortness Of Breath and Swelling   Sulfa Antibiotics Anaphylaxis   Penicillins Hives   Sumatriptan      Made migraine more intense and scalp felt like it was on fire    HOME MEDICATIONS: Outpatient Medications Prior to Visit  Medication Sig Dispense Refill   albuterol  (VENTOLIN  HFA) 108 (90 Base) MCG/ACT inhaler INHALE 1-2 PUFFS BY MOUTH EVERY 6 HOURS AS NEEDED FOR WHEEZE OR SHORTNESS OF BREATH 18 each 3   aspirin -acetaminophen -caffeine  (EXCEDRIN  MIGRAINE) 250-250-65 MG tablet Take 2 tablets by mouth every 8 (eight) hours as needed for migraine.      Azelastine  HCl 137 MCG/SPRAY SOLN Place 1 spray into both nostrils at bedtime.     baclofen  (LIORESAL ) 10 MG tablet TAKE 1 TABLET BY MOUTH THREE TIMES A DAY AS NEEDED FOR MUSCLE SPASM 270 tablet 0   buPROPion  (WELLBUTRIN  XL) 150 MG 24 hr tablet TAKE 1 TABLET BY MOUTH EVERY DAY 90 tablet 3   buPROPion  (WELLBUTRIN  XL) 300 MG 24 hr tablet TAKE 1 TABLET BY MOUTH EVERY DAY 90 tablet 3   fluticasone  (FLONASE ) 50 MCG/ACT nasal spray Place 2 sprays into both nostrils daily. 16 g 11   gabapentin  (NEURONTIN ) 600 MG tablet TAKE 1 TABLET BY MOUTH TWICE A DAY 60 tablet 5   meloxicam (MOBIC) 15 MG tablet Take 1 tablet by mouth daily.     Multiple Vitamin (MULTIVITAMIN WITH MINERALS) TABS tablet Take 1 tablet by mouth daily.     omeprazole  (PRILOSEC) 40 MG capsule TAKE 1 CAPSULE (40 MG TOTAL) BY MOUTH DAILY. 90 capsule 2   rizatriptan  (MAXALT ) 10 MG tablet Take 10 mg by mouth every  2 (two) hours as needed for migraine (max 2 tablets/24 hrs.).   0   sertraline  (ZOLOFT ) 100 MG tablet TAKE 1 TABLET BY MOUTH EVERY DAY 90 tablet 0   torsemide  (DEMADEX ) 20 MG tablet TAKE 2 TABLETS (40 MG TOTAL) BY MOUTH DAILY. 180 tablet 1   triamcinolone  ointment (KENALOG ) 0.5 % Apply 1 Application topically 2 (two) times daily. 30 g 0   clobetasol  cream (TEMOVATE ) 0.05 % Apply 1 Application topically 2 (two) times daily. (Patient not taking: Reported on 10/19/2023) 30 g 0   No facility-administered medications prior to visit.    PAST MEDICAL HISTORY: Past Medical History:  Diagnosis Date   Anxiety    Arthritis    Complication of anesthesia    CAUSES MIGRAINE  Depression    Diabetes (HCC)    Edema of both lower extremities    GERD (gastroesophageal reflux disease)    Headache    Menetrier disease    Migraines    Pre-diabetes    Sleep apnea    CPAP    PAST SURGICAL HISTORY: Past Surgical History:  Procedure Laterality Date   ANTERIOR CRUCIATE LIGAMENT REPAIR Right 1996   COLONOSCOPY WITH PROPOFOL  N/A 02/25/2022   Procedure: COLONOSCOPY WITH PROPOFOL ;  Surgeon: Marnee Sink, MD;  Location: [facility];  Service: Endoscopy;  Laterality: N/A;   DILATION AND CURETTAGE OF UTERUS  2004   FOOT SURGERY Bilateral    JOINT REPLACEMENT Right    KNEE ARTHROPLASTY Right 05/02/2019   Procedure: COMPUTER ASSISTED TOTAL KNEE ARTHROPLASTY;  Surgeon: Arlyne Lame, MD;  Location: [facility];  Service: Orthopedics;  Laterality: Right;   KNEE ARTHROPLASTY Left 07/09/2019   Procedure: COMPUTER ASSISTED TOTAL KNEE ARTHROPLASTY;  Surgeon: Arlyne Lame, MD;  Location: [facility];  Service: Orthopedics;  Laterality: Left;   KNEE SURGERY Right    MENISCUS REPAIR Bilateral     FAMILY HISTORY: Family History  Problem Relation Age of Onset   Hypertension Brother    Diabetes Brother    Diabetes Other    Hypertension Other    Glaucoma Other    Breast cancer Maternal Aunt        47's     SOCIAL HISTORY: Social History   Socioeconomic History   Marital status: Widowed    Spouse name: Not on file   Number of children: 1   Years of education: Not on file   Highest education level: Bachelor's degree (e.g., BA, AB, BS)  Occupational History   Not on file  Tobacco Use   Smoking status: Former    Current packs/day: 0.00    Average packs/day: 1 pack/day for 15.0 years (15.0 ttl pk-yrs)    Types: Cigarettes    Start date: 06/20/1984    Quit date: 06/21/1999    Years since quitting: 24.3   Smokeless tobacco: Never  Vaping Use   Vaping status: Never Used  Substance and Sexual Activity   Alcohol use: Yes    Alcohol/week: 0.0 standard drinks of alcohol    Comment: occasional   Drug use: No   Sexual activity: Yes    Birth control/protection: None  Other Topics Concern   Not on file  Social History Narrative   1 adopted boy and 2 grandsons live with her- per patient the boys moved back with their mother 11/2021   Caffeine - coffee 1 c daily   Patient loss he husband November 28,2022.   Lives only with her son.   Right handed   Social Drivers of Health   Financial Resource Strain: Low Risk  (07/17/2023)   Overall Financial Resource Strain (CARDIA)    Difficulty of Paying Living Expenses: Not hard at all  Food Insecurity: No Food Insecurity (07/17/2023)   Hunger Vital Sign    Worried About Running Out of Food in the Last Year: Never true    Ran Out of Food in the Last Year: Never true  Transportation Needs: No Transportation Needs (07/17/2023)   PRAPARE - Administrator, Civil Service (Medical): No    Lack of Transportation (Non-Medical): No  Physical Activity: Insufficiently Active (07/17/2023)   Exercise Vital Sign    Days of Exercise per Week: 2 days    Minutes of Exercise per Session: 20 min  Stress:  Stress Concern Present (07/17/2023)   [employer] of Occupational Health - Occupational Stress Questionnaire    Feeling of Stress : Rather  much  Social Connections: Moderately Integrated (07/17/2023)   Social Connection and Isolation Panel [NHANES]    Frequency of Communication with Friends and Family: More than three times a week    Frequency of Social Gatherings with Friends and Family: Once a week    Attends Religious Services: More than 4 times per year    Active Member of [employer] or Organizations: Yes    Attends Banker Meetings: More than 4 times per year    Marital Status: Widowed  Intimate Partner Violence: Not on file      PHYSICAL EXAM  Vitals:   10/19/23 1111  Weight: 233 lb (105.7 kg)  Height: 5\' 3"  (1.6 m)   Body mass index is 41.27 kg/m.  Generalized: Well developed, in no acute distress   Neurological examination  Mentation: Alert oriented to time, place, history taking. Follows all commands speech and language fluent Cranial nerve II-XII: Pupils were equal round reactive to light. Extraocular movements were full, visual field were full on confrontational test. Facial sensation and strength were normal. Uvula tongue midline. Head turning and shoulder shrug  were normal and symmetric. Motor: The motor testing reveals 5 over 5 strength of all 4 extremities. Good symmetric motor tone is noted throughout.  Sensory: Sensory testing is intact to soft touch on all 4 extremities. No evidence of extinction is noted.  Coordination: Cerebellar testing reveals good finger-nose-finger and heel-to-shin bilaterally.  Gait and station: Gait is normal. Romberg negative Reflexes: Deep tendon reflexes are symmetric and normal bilaterally.   DIAGNOSTIC DATA (LABS, IMAGING, TESTING) - I reviewed patient records, labs, notes, testing and imaging myself where available.  Lab Results  Component Value Date   WBC 6.3 01/11/2022   HGB 15.3 01/11/2022   HCT 45.1 01/11/2022   MCV 91 01/11/2022   PLT 293 01/11/2022      Component Value Date/Time   NA 143 01/14/2023 1109   K 4.2 01/14/2023 1109   CL 101  01/14/2023 1109   CO2 26 01/14/2023 1109   GLUCOSE 105 (H) 01/14/2023 1109   GLUCOSE 104 (H) 12/26/2019 0426   BUN 17 01/14/2023 1109   CREATININE 1.07 (H) 01/14/2023 1109   CREATININE 0.92 05/13/2017 0855   CALCIUM 9.3 01/14/2023 1109   PROT 6.8 01/14/2023 1109   ALBUMIN 4.4 01/14/2023 1109   AST 22 01/14/2023 1109   ALT 15 01/14/2023 1109   ALKPHOS 84 01/14/2023 1109   BILITOT 0.5 01/14/2023 1109   GFRNONAA 65 07/03/2020 1134   GFRNONAA 69 05/13/2017 0855   GFRAA 75 07/03/2020 1134   GFRAA 80 05/13/2017 0855   Lab Results  Component Value Date   CHOL 154 01/14/2023   HDL 50 01/14/2023   LDLCALC 83 01/14/2023   TRIG 116 01/14/2023   CHOLHDL 3.1 01/14/2023   Lab Results  Component Value Date   HGBA1C 5.7 (A) 07/28/2023   Lab Results  Component Value Date   VITAMINB12 536 01/11/2022   Lab Results  Component Value Date   TSH 1.660 01/11/2022      ASSESSMENT AND PLAN 63 y.o. year old female  has a past medical history of Anxiety, Arthritis, Complication of anesthesia, Depression, Diabetes (HCC), Edema of both lower extremities, GERD (gastroesophageal reflux disease), Headache, Menetrier disease, Migraines, Pre-diabetes, and Sleep apnea. here with:  1.  Migraine headaches  -- Improved -- Did  advised to see if morning headahces correlate with CPAP usage.  -- will try to reduce gabapentin  dose to see how she responds. Advised to reduce AM dose to 300 mg, continue 600 mg at bedtime for 2-3 weeks. If tolerating, can reduce to 300 mg BID.   2.  Obstructive sleep apnea on CPAP  -- Compliance good -- Residual AHI in normal range -- Continue CPAP nightly and greater than 4 hours each night -- Encouraged the patient to try to use the machine nightly and >7 hours every night  At the end of the visit she mentions memory problems. She reports telling her PCP and blood work was completed. She has an appointment later this week with PCP. I advised to discuss with PCP if they  feel her symptoms warrant a full memory evaluation by neurology they can send us  a referral. I will message PCP as well. Upon chart review after the visit- he appears that she mentioned some trouble with her memory in 2023 with another provider at our office but was dealing with grief at that time. Also has a history of depression. Certainly memory could be mulitfactoral. I will ask PCP to weigh in and if needed we will get her back in for full memory eval   Follow-up in 6 months or sooner if needed     Clem Currier, MSN, NP-C 10/19/2023, 11:18 AM [facility] [address] [place], [place] [address] [phone]

## 2023-10-19 NOTE — Patient Instructions (Addendum)
 Your Plan:  Continue using CPAP nightly and greater than 4 hours each night Try to reduce gabapentin - decrease dose to 300 mg in the AM and 600 mg in the PM for 2-3 weeks. If tolerating, then can further reduce to 300 mg twice a day.  Also see if their is a correlation to when you have morning headaches and cpap usage the night before. Discuss memory concerns with PCP if they feel that you need a complete memory work-up they can send over a referral.  If your symptoms worsen or you develop new symptoms please let us  know.    Thank you for coming to see us  at [facility]. I hope we have been able to provide you high quality care today.  You may receive a patient satisfaction survey over the next few weeks. We would appreciate your feedback and comments so that we may continue to improve ourselves and the health of our patients.

## 2023-10-24 ENCOUNTER — Ambulatory Visit: Admitting: Podiatry

## 2023-10-26 ENCOUNTER — Ambulatory Visit (INDEPENDENT_AMBULATORY_CARE_PROVIDER_SITE_OTHER): Admitting: Podiatry

## 2023-10-26 ENCOUNTER — Encounter: Payer: Self-pay | Admitting: Podiatry

## 2023-10-26 ENCOUNTER — Ambulatory Visit (INDEPENDENT_AMBULATORY_CARE_PROVIDER_SITE_OTHER)

## 2023-10-26 DIAGNOSIS — S9032XA Contusion of left foot, initial encounter: Secondary | ICD-10-CM | POA: Diagnosis not present

## 2023-10-26 DIAGNOSIS — S92335A Nondisplaced fracture of third metatarsal bone, left foot, initial encounter for closed fracture: Secondary | ICD-10-CM

## 2023-10-26 DIAGNOSIS — M2040 Other hammer toe(s) (acquired), unspecified foot: Secondary | ICD-10-CM

## 2023-10-26 DIAGNOSIS — S92505A Nondisplaced unspecified fracture of left lesser toe(s), initial encounter for closed fracture: Secondary | ICD-10-CM

## 2023-10-26 NOTE — Progress Notes (Addendum)
 She presents today with a date of injury 10/16/2023 walking through the yard barefoot and stubbed her foot on a 4 x 4 in the yard with bruising and swelling.  [facility] and [facility]ed and was told that there were no fractures.  But she still noted pain and she was dispensed a surgical shoe.  With the swelling and bruising continuing and the pain continuing she thought that she should come here for another evaluation.  She is also complaining of her left ankle still being painful.  Stating that it is inhibiting her from walking.  Objective: Pulses are palpable to the left foot.  She has swelling and erythema overlying the base of the second toe in the head of the third metatarsal.  Pain on palpation left ankle subtalar joint area left mild edema no erythema cellulitis drainage or odor no open lesions or wounds  Radiographs taken today demonstrate osseously mature individual with generalized demineralization of bone with a fracture third metatarsal neck slightly dorsal and lateral dislocation of the head on the neck but still bone to bone contact.  She also has a nondisplaced fracture of the base of the proximal phalanx second digit.  Assessment: Fracture third metatarsal and second digit.  Chronic pain capsulitis anterolateral ankle peroneals left.  Cannot rule out tears.  Plan: Recommended that she go home and place her foot in her cam boot and continue to wear the cam boot for the next 6 weeks I will follow-up with her in 4 weeks for another set of x-rays.  She will continue current therapies for her left foot and ankle anti-inflammatories at home stretching.  However she will wear this cam boot which may assist in healing of the ankle and peroneal tendons for the next 6 weeks.

## 2023-11-01 DIAGNOSIS — M5416 Radiculopathy, lumbar region: Secondary | ICD-10-CM | POA: Diagnosis not present

## 2023-11-08 ENCOUNTER — Telehealth (INDEPENDENT_AMBULATORY_CARE_PROVIDER_SITE_OTHER): Admitting: Family Medicine

## 2023-11-08 ENCOUNTER — Encounter: Payer: Self-pay | Admitting: Family Medicine

## 2023-11-08 DIAGNOSIS — R413 Other amnesia: Secondary | ICD-10-CM

## 2023-11-08 NOTE — Progress Notes (Signed)
 MyChart Video Visit    Virtual Visit via Video Note   This format is felt to be most appropriate for this patient at this time. Physical exam was limited by quality of the video and audio technology used for the visit.    Patient location: home Provider location: [facility] Persons involved in the visit: patient, provider  I discussed the limitations of evaluation and management by telemedicine and the availability of in person appointments. The patient expressed understanding and agreed to proceed.  Patient: Rachael Brewer   DOB: 01-15-1961   63 y.o. Female  MRN: [id number] Visit Date: 11/08/2023  Today's healthcare provider: Aden Agreste, MD   Chief Complaint  Patient presents with   Acute Visit    memory loss   Subjective    HPI HPI     Acute Visit    Additional comments: memory loss      Last edited by Mazie Speed, MD on 11/08/2023  3:57 PM.       Discussed the use of AI scribe software for clinical note transcription with the patient, who gave verbal consent to proceed.  History of Present Illness   Rachael Brewer "Rachael Brewer" is a 63 year old female who presents with memory issues.  [name] experiences significant memory issues, initially noticed by a friend (she mentioned how much she liked the changes to their house 4 different times). She forgets recent events, appointments, and frequently misplaces her phone. She struggles with word-finding and feels her speech has regressed.  She has a history of migraines and uses a CPAP machine. She manages her mood and depression through exercise, yoga, stretching, and intermittent fasting. She engages in social activities, including Bible study, church, and spending time with friends and family, which she feels has improved her mood.  She has broken two toes, limiting her ability to walk her dog, an activity she previously enjoyed. Despite these efforts, she continues to experience low  moments, although her mood has generally improved.           11/08/2023    3:36 PM 02/09/2018   10:00 AM  MMSE - Mini Mental State Exam  Orientation to time 5 5  Orientation to Place 5 5  Registration 3 3  Attention/ Calculation 5 5  Recall 3 3  Language- name 2 objects 2 2  Language- repeat 1 1  Language- follow 3 step command 3 3  Language- follow 3 step command-comments Did take more prompting   Language- read & follow direction 1 1  Write a sentence 1 1  Copy design 1 1  Total score 30 30     Review of Systems      Objective    LMP  (LMP Unknown)       Physical Exam Constitutional:      General: She is not in acute distress.    Appearance: Normal appearance.  HENT:     Head: Normocephalic.  Pulmonary:     Effort: Pulmonary effort is normal. No respiratory distress.  Neurological:     Mental Status: She is alert and oriented to person, place, and time. Mental status is at baseline.        Assessment & Plan     Problem List Items Addressed This Visit   None Visit Diagnoses       Memory loss    -  Primary   Relevant Orders   B12 and Folate Panel   TSH  Comprehensive metabolic panel with GFR   CBC with Differential/Platelet   RPR   HIV Antibody (routine testing w rflx)           Memory loss Memory loss with recent worsening, characterized by forgetfulness of appointments, difficulty recalling recent events, and notable difficulty with word-finding and speech. Concerns about potential Alzheimer's disease. Differential diagnosis includes pseudodementia due to depression, vascular causes, or other organic causes. Memory test performed with full points achieved, though some difficulty with three-step command noted. Previous MRI of the brain in 2022 showed no significant findings (chronic white matter changes). Plan to rule out reversible causes of memory loss. Discussed potential use of medications like Aricept and Namenda to slow memory decline,  though decision deferred pending further evaluation. - Order blood tests including B12, folate, TSH, CBC, kidney and liver function, syphilis, and HIV. - Refer to neurology for further memory evaluation. - Message neurology to inform her of the current findings and request follow-up appointment.  Depression Depression with some improvement. Engaging in activities such as exercise, yoga, and social interactions to manage mood. No current evidence of pseudodementia due to depression.       No orders of the defined types were placed in this encounter.    Return if symptoms worsen or fail to improve.     I discussed the assessment and treatment plan with the patient. The patient was provided an opportunity to ask questions and all were answered. The patient agreed with the plan and demonstrated an understanding of the instructions.   The patient was advised to call back or seek an in-person evaluation if the symptoms worsen or if the condition fails to improve as anticipated.   I personally spent a total of 45 minutes in the care of the patient today including preparing to see the patient, getting/reviewing separately obtained history, performing a medically appropriate exam/evaluation, counseling and educating, placing orders, referring and communicating with other health care professionals, documenting clinical information in the EHR, and coordinating care.     Aden Agreste, MD [facility] [phone] (phone) [phone] (fax)  [facility]

## 2023-11-23 ENCOUNTER — Ambulatory Visit: Admitting: Podiatry

## 2023-11-23 DIAGNOSIS — S92335A Nondisplaced fracture of third metatarsal bone, left foot, initial encounter for closed fracture: Secondary | ICD-10-CM

## 2023-11-25 DIAGNOSIS — M5416 Radiculopathy, lumbar region: Secondary | ICD-10-CM | POA: Diagnosis not present

## 2023-11-28 DIAGNOSIS — M5416 Radiculopathy, lumbar region: Secondary | ICD-10-CM | POA: Diagnosis not present

## 2023-12-03 LAB — CBC WITH DIFFERENTIAL/PLATELET
Basophils Absolute: 0.1 10*3/uL (ref 0.0–0.2)
Basos: 1 %
EOS (ABSOLUTE): 0.2 10*3/uL (ref 0.0–0.4)
Eos: 3 %
Hematocrit: 50.8 % — ABNORMAL HIGH (ref 34.0–46.6)
Hemoglobin: 16.5 g/dL — ABNORMAL HIGH (ref 11.1–15.9)
Immature Grans (Abs): 0 10*3/uL (ref 0.0–0.1)
Immature Granulocytes: 0 %
Lymphocytes Absolute: 1.9 10*3/uL (ref 0.7–3.1)
Lymphs: 34 %
MCH: 31.1 pg (ref 26.6–33.0)
MCHC: 32.5 g/dL (ref 31.5–35.7)
MCV: 96 fL (ref 79–97)
Monocytes Absolute: 0.4 10*3/uL (ref 0.1–0.9)
Monocytes: 8 %
Neutrophils Absolute: 3 10*3/uL (ref 1.4–7.0)
Neutrophils: 54 %
Platelets: 295 10*3/uL (ref 150–450)
RBC: 5.31 x10E6/uL — ABNORMAL HIGH (ref 3.77–5.28)
RDW: 13 % (ref 11.7–15.4)
WBC: 5.6 10*3/uL (ref 3.4–10.8)

## 2023-12-03 LAB — COMPREHENSIVE METABOLIC PANEL WITH GFR
ALT: 13 IU/L (ref 0–32)
AST: 20 IU/L (ref 0–40)
Albumin: 4.5 g/dL (ref 3.9–4.9)
Alkaline Phosphatase: 84 IU/L (ref 44–121)
BUN/Creatinine Ratio: 19 (ref 12–28)
BUN: 21 mg/dL (ref 8–27)
Bilirubin Total: 0.6 mg/dL (ref 0.0–1.2)
CO2: 24 mmol/L (ref 20–29)
Calcium: 9.7 mg/dL (ref 8.7–10.3)
Chloride: 102 mmol/L (ref 96–106)
Creatinine, Ser: 1.1 mg/dL — ABNORMAL HIGH (ref 0.57–1.00)
Globulin, Total: 2.5 g/dL (ref 1.5–4.5)
Glucose: 110 mg/dL — ABNORMAL HIGH (ref 70–99)
Potassium: 4.7 mmol/L (ref 3.5–5.2)
Sodium: 142 mmol/L (ref 134–144)
Total Protein: 7 g/dL (ref 6.0–8.5)
eGFR: 56 mL/min/{1.73_m2} — ABNORMAL LOW (ref >59)

## 2023-12-03 LAB — HIV ANTIBODY (ROUTINE TESTING W REFLEX): HIV Screen 4th Generation wRfx: NONREACTIVE

## 2023-12-03 LAB — B12 AND FOLATE PANEL
Folate: >20 ng/mL (ref >3.0)
Vitamin B-12: 649 pg/mL (ref 232–1245)

## 2023-12-03 LAB — TSH: TSH: 3.11 u[IU]/mL (ref 0.450–4.500)

## 2023-12-03 LAB — RPR: RPR Ser Ql: NONREACTIVE

## 2023-12-05 ENCOUNTER — Ambulatory Visit: Payer: Self-pay | Admitting: Family Medicine

## 2023-12-07 ENCOUNTER — Other Ambulatory Visit: Payer: Self-pay | Admitting: Family Medicine

## 2023-12-07 DIAGNOSIS — M545 Low back pain, unspecified: Secondary | ICD-10-CM

## 2023-12-07 DIAGNOSIS — M62830 Muscle spasm of back: Secondary | ICD-10-CM

## 2023-12-08 NOTE — Telephone Encounter (Signed)
 Requested Prescriptions  Pending Prescriptions Disp Refills   sertraline  (ZOLOFT ) 100 MG tablet [Pharmacy Med Name: SERTRALINE  HCL 100 MG TABLET] 90 tablet 0    Sig: TAKE 1 TABLET BY MOUTH EVERY DAY     Psychiatry:  Antidepressants - SSRI - sertraline  Passed - 12/08/2023  5:30 PM      Passed - AST in normal range and within 360 days    AST  Date Value Ref Range Status  12/02/2023 20 0 - 40 IU/L Final         Passed - ALT in normal range and within 360 days    ALT  Date Value Ref Range Status  12/02/2023 13 0 - 32 IU/L Final         Passed - Completed PHQ-2 or PHQ-9 in the last 360 days      Passed - Valid encounter within last 6 months    Recent Outpatient Visits           1 month ago Memory loss   [facility] [place], Stan Eans, MD   4 months ago Prediabetes   [place] [facility] [place], Stan Eans, MD       Future Appointments             In 1 month Bacigalupo, Stan Eans, MD [facility]             baclofen  (LIORESAL ) 10 MG tablet [Pharmacy Med Name: BACLOFEN  10 MG TABLET] 270 tablet 0    Sig: TAKE 1 TABLET BY MOUTH THREE TIMES A DAY AS NEEDED FOR MUSCLE SPASM     Analgesics:  Muscle Relaxants - baclofen  Failed - 12/08/2023  5:30 PM      Failed - Cr in normal range and within 180 days    Creat  Date Value Ref Range Status  05/13/2017 0.92 0.50 - 1.05 mg/dL Final    Comment:    For patients >85 years of age, the reference limit for Creatinine is approximately 13% higher for people identified as African-American. .    Creatinine, Ser  Date Value Ref Range Status  12/02/2023 1.10 (H) 0.57 - 1.00 mg/dL Final         Passed - eGFR is 30 or above and within 180 days    GFR, Est African American  Date Value Ref Range Status  05/13/2017 80 > OR = 60 mL/min/1.72m2 Final   GFR calc Af Amer  Date Value Ref Range Status  07/03/2020 75 >59 mL/min/1.73 Final    Comment:    **In  accordance with recommendations from the NKF-ASN Task force,**   [facility] is in the process of updating its eGFR calculation to the   2021 CKD-EPI creatinine equation that estimates kidney function   without a race variable.    GFR, Est Non African American  Date Value Ref Range Status  05/13/2017 69 > OR = 60 mL/min/1.68m2 Final   GFR calc non Af Amer  Date Value Ref Range Status  07/03/2020 65 >59 mL/min/1.73 Final   eGFR  Date Value Ref Range Status  12/02/2023 56 (L) >59 mL/min/1.73 Final         Passed - Valid encounter within last 6 months    Recent Outpatient Visits           1 month ago Memory loss   [facility], Stan Eans, MD   4 months ago Prediabetes   [place]  [facility] Bacigalupo, Stan Eans, MD       Future Appointments             In 1 month Bacigalupo, Stan Eans, MD [facility]

## 2023-12-09 DIAGNOSIS — M5416 Radiculopathy, lumbar region: Secondary | ICD-10-CM | POA: Diagnosis not present

## 2023-12-14 DIAGNOSIS — M25551 Pain in right hip: Secondary | ICD-10-CM | POA: Diagnosis not present

## 2023-12-14 DIAGNOSIS — M5416 Radiculopathy, lumbar region: Secondary | ICD-10-CM | POA: Diagnosis not present

## 2023-12-19 ENCOUNTER — Telehealth: Payer: Self-pay | Admitting: Family Medicine

## 2023-12-19 ENCOUNTER — Ambulatory Visit (INDEPENDENT_AMBULATORY_CARE_PROVIDER_SITE_OTHER)

## 2023-12-19 ENCOUNTER — Ambulatory Visit (INDEPENDENT_AMBULATORY_CARE_PROVIDER_SITE_OTHER): Admitting: Podiatry

## 2023-12-19 ENCOUNTER — Encounter: Payer: Self-pay | Admitting: Podiatry

## 2023-12-19 DIAGNOSIS — S92335D Nondisplaced fracture of third metatarsal bone, left foot, subsequent encounter for fracture with routine healing: Secondary | ICD-10-CM | POA: Diagnosis not present

## 2023-12-19 DIAGNOSIS — Q666 Other congenital valgus deformities of feet: Secondary | ICD-10-CM | POA: Diagnosis not present

## 2023-12-19 DIAGNOSIS — M7752 Other enthesopathy of left foot: Secondary | ICD-10-CM

## 2023-12-19 MED ORDER — ALBUTEROL SULFATE HFA 108 (90 BASE) MCG/ACT IN AERS: INHALATION_SPRAY | RESPIRATORY_TRACT | 3 refills | Status: AC

## 2023-12-19 MED ORDER — TRIAMCINOLONE ACETONIDE 40 MG/ML IJ SUSP
20.0000 mg | Freq: Once | INTRAMUSCULAR | Status: AC
  Administered 2023-12-19: 20 mg

## 2023-12-19 NOTE — Telephone Encounter (Signed)
 [facility] faxed refill request for the following medications:   albuterol (VENTOLIN HFA) 108 (90 Base) MCG/ACT inhaler    Please advise.

## 2023-12-19 NOTE — Progress Notes (Signed)
 She presents today for follow-up of her third met fracture left foot.  States that it feels fine that she presents in regular shoe gear.  She is however having pain right and here she points to the sinus tarsi of the left foot.  Objective: Vital signs are stable alert oriented x 3 states that the grabbing pain is right in here.  She has pain on palpation to the sinus tarsi of pain and range of motion of the subtalar joint consistent with subtalar joint capsulitis.  She has no pain on palpation of the third metatarsal neck left.  Radiographs taken today demonstrate large bone callus with minimal deviation of the fracture site.  Assessment: Subtalar joint capsulitis left.  Healing fracture of third metatarsal neck left  Plan: I injected the subtalar joint today with Kenalog .

## 2023-12-27 DIAGNOSIS — M5416 Radiculopathy, lumbar region: Secondary | ICD-10-CM | POA: Diagnosis not present

## 2023-12-28 ENCOUNTER — Other Ambulatory Visit: Payer: Self-pay | Admitting: Family Medicine

## 2023-12-28 DIAGNOSIS — R601 Generalized edema: Secondary | ICD-10-CM

## 2024-01-04 ENCOUNTER — Other Ambulatory Visit: Payer: Self-pay | Admitting: Family Medicine

## 2024-01-05 DIAGNOSIS — M5416 Radiculopathy, lumbar region: Secondary | ICD-10-CM | POA: Diagnosis not present

## 2024-01-05 NOTE — Telephone Encounter (Signed)
 Requested Prescriptions  Refused Prescriptions Disp Refills   sertraline  (ZOLOFT ) 100 MG tablet [Pharmacy Med Name: SERTRALINE  HCL 100 MG TABLET] 90 tablet 1    Sig: TAKE 1 TABLET BY MOUTH EVERY DAY     Psychiatry:  Antidepressants - SSRI - sertraline  Passed - 01/05/2024  5:59 PM      Passed - AST in normal range and within 360 days    AST  Date Value Ref Range Status  12/02/2023 20 0 - 40 IU/L Final         Passed - ALT in normal range and within 360 days    ALT  Date Value Ref Range Status  12/02/2023 13 0 - 32 IU/L Final         Passed - Completed PHQ-2 or PHQ-9 in the last 360 days      Passed - Valid encounter within last 6 months    Recent Outpatient Visits           1 month ago Memory loss   [facility], Jon HERO, MD   5 months ago Prediabetes   [facility] [place], Jon HERO, MD       Future Appointments             In 3 weeks Bacigalupo, Jon HERO, MD [facility]

## 2024-01-16 ENCOUNTER — Other Ambulatory Visit: Payer: Self-pay | Admitting: Family Medicine

## 2024-01-16 DIAGNOSIS — F32A Depression, unspecified: Secondary | ICD-10-CM

## 2024-01-17 DIAGNOSIS — M5416 Radiculopathy, lumbar region: Secondary | ICD-10-CM | POA: Diagnosis not present

## 2024-01-25 DIAGNOSIS — M5416 Radiculopathy, lumbar region: Secondary | ICD-10-CM | POA: Diagnosis not present

## 2024-01-26 ENCOUNTER — Ambulatory Visit: Payer: 59 | Admitting: Family Medicine

## 2024-01-26 ENCOUNTER — Encounter: Payer: Self-pay | Admitting: Family Medicine

## 2024-01-26 VITALS — BP 119/79 | HR 64 | Temp 98.2°F | Ht 63.0 in | Wt 233.6 lb

## 2024-01-26 DIAGNOSIS — Z1231 Encounter for screening mammogram for malignant neoplasm of breast: Secondary | ICD-10-CM

## 2024-01-26 DIAGNOSIS — R7303 Prediabetes: Secondary | ICD-10-CM | POA: Diagnosis not present

## 2024-01-26 DIAGNOSIS — F339 Major depressive disorder, recurrent, unspecified: Secondary | ICD-10-CM | POA: Diagnosis not present

## 2024-01-26 DIAGNOSIS — M79604 Pain in right leg: Secondary | ICD-10-CM | POA: Diagnosis not present

## 2024-01-26 LAB — POCT GLYCOSYLATED HEMOGLOBIN (HGB A1C): Hemoglobin A1C: 5.6 % (ref 4.0–5.6)

## 2024-01-26 MED ORDER — SERTRALINE HCL 50 MG PO TABS: 50.0000 mg | ORAL_TABLET | Freq: Every day | ORAL | 1 refills | Status: DC

## 2024-01-26 MED ORDER — MUPIROCIN 2 % EX OINT: 1.0000 | TOPICAL_OINTMENT | Freq: Two times a day (BID) | CUTANEOUS | 0 refills | Status: AC
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
## 2024-01-26 NOTE — Assessment & Plan Note (Signed)
 Currently managed with medication and therapy. Reports improvement after talking to therapist consistently. States that most of her symptoms stem from her external stressors including her health, her son's substance use and behavioral control, and recent health of her mom. Endorses trying to take less Zoloft .  - Continue Zoloft  50 mg daily and Wellbutrin  XL 450 mg daily - Discussed wellbeing measures/taking time for herself, even if brief

## 2024-01-26 NOTE — Assessment & Plan Note (Signed)
 Currently well controlled with lifestyle modifications. Down 6 pounds since last in person visit. Reports eating better but not able to exercise as much due to back and leg pain. - Continue lifestyle modifications - Discussed use to semaglutide for weight loss (patient would like to read and discuss next time)

## 2024-01-26 NOTE — Assessment & Plan Note (Signed)
 Currently well controlled on lifestyle modifications. Last A1C was 5.7 and resulted as 5.6 today. Denies any symptoms. Recent weight loss of 6 pounds likely has benefited. - Continue lifestyle modifications - Order A1C (5.6 today)

## 2024-02-16 ENCOUNTER — Other Ambulatory Visit: Payer: Self-pay | Admitting: Family Medicine

## 2024-02-16 DIAGNOSIS — F32A Depression, unspecified: Secondary | ICD-10-CM

## 2024-04-11 DIAGNOSIS — M48061 Spinal stenosis, lumbar region without neurogenic claudication: Secondary | ICD-10-CM | POA: Diagnosis not present

## 2024-04-11 DIAGNOSIS — M5416 Radiculopathy, lumbar region: Secondary | ICD-10-CM | POA: Diagnosis not present

## 2024-04-11 DIAGNOSIS — R609 Edema, unspecified: Secondary | ICD-10-CM | POA: Diagnosis not present

## 2024-04-11 DIAGNOSIS — M4316 Spondylolisthesis, lumbar region: Secondary | ICD-10-CM | POA: Diagnosis not present

## 2024-04-13 DIAGNOSIS — M5416 Radiculopathy, lumbar region: Secondary | ICD-10-CM | POA: Diagnosis not present

## 2024-04-26 ENCOUNTER — Telehealth: Payer: Self-pay

## 2024-04-26 NOTE — Progress Notes (Unsigned)
 Complex Care Management Note Care Guide Note  04/26/2024 Name: Rachael Brewer MRN: [id number] DOB: Aug 31, 1960   Complex Care Management Outreach Attempts: An unsuccessful telephone outreach was attempted today to offer the patient information about available complex care management services.  Follow Up Plan:  Additional outreach attempts will be made to offer the patient complex care management information and services.   Encounter Outcome:  No Answer  Dreama [facility]  [facility], [facility] VBCI Assistant Direct Dial: [phone]  Fax: [phone]

## 2024-04-27 ENCOUNTER — Telehealth: Payer: Self-pay

## 2024-04-27 DIAGNOSIS — M5416 Radiculopathy, lumbar region: Secondary | ICD-10-CM

## 2024-04-27 NOTE — Progress Notes (Signed)
 Complex Care Management Note  Care Guide Note 04/27/2024 Name: Rachael Brewer MRN: [id number] DOB: 03/31/1961  Rachael Brewer is a 63 y.o. year old female who sees Bacigalupo, Jon CHRISTELLA, MD for primary care. I reached out to Montie CHRISTELLA Norlander by phone today to offer complex care management services.  Ms. Pascoe was given information about Complex Care Management services today including:   The Complex Care Management services include support from the care team which includes your Nurse Care Manager, Clinical Social Worker, or Pharmacist.  The Complex Care Management team is here to help remove barriers to the health concerns and goals most important to you. Complex Care Management services are voluntary, and the patient may decline or stop services at any time by request to their care team member.   Complex Care Management Consent Status: Patient agreed to services and verbal consent obtained.   Follow up plan:  Face to Face appointment with complex care management team member scheduled for:  05/25/24 at 11:00 a.m.   Encounter Outcome:  Patient Scheduled  Dreama [facility]  [facility], [facility] VBCI Assistant Direct Dial: [phone]  Fax: [phone]

## 2024-04-30 ENCOUNTER — Encounter: Payer: Self-pay | Admitting: Family Medicine

## 2024-04-30 NOTE — Telephone Encounter (Unsigned)
 Copied from CRM [id number]. Topic: Clinical - Request for Lab/Test Order >> Apr 30, 2024 12:51 PM Delon DASEN wrote: Reason for CRM: Patient has not been scheduled for an MRI yet, still waiting for neurologist to schedule an appt, would like Dr B to call them and expedite it if possible- [phone]

## 2024-05-01 ENCOUNTER — Encounter: Payer: Self-pay | Admitting: Family Medicine

## 2024-05-01 ENCOUNTER — Ambulatory Visit: Admitting: Family Medicine

## 2024-05-01 VITALS — BP 153/73 | HR 65 | Ht 63.0 in | Wt 234.8 lb

## 2024-05-01 DIAGNOSIS — Z23 Encounter for immunization: Secondary | ICD-10-CM

## 2024-05-01 DIAGNOSIS — F339 Major depressive disorder, recurrent, unspecified: Secondary | ICD-10-CM

## 2024-05-01 DIAGNOSIS — L239 Allergic contact dermatitis, unspecified cause: Secondary | ICD-10-CM

## 2024-05-01 DIAGNOSIS — Z0000 Encounter for general adult medical examination without abnormal findings: Secondary | ICD-10-CM | POA: Diagnosis not present

## 2024-05-01 DIAGNOSIS — G43709 Chronic migraine without aura, not intractable, without status migrainosus: Secondary | ICD-10-CM | POA: Diagnosis not present

## 2024-05-01 DIAGNOSIS — R030 Elevated blood-pressure reading, without diagnosis of hypertension: Secondary | ICD-10-CM

## 2024-05-01 DIAGNOSIS — R7303 Prediabetes: Secondary | ICD-10-CM | POA: Diagnosis not present

## 2024-05-01 MED ORDER — CLOBETASOL PROPIONATE 0.05 % EX CREA
1.0000 | TOPICAL_CREAM | Freq: Two times a day (BID) | CUTANEOUS | 5 refills | Status: AC
Start: 2024-05-01 — End: ?

## 2024-05-01 MED ORDER — SERTRALINE HCL 100 MG PO TABS: 100.0000 mg | ORAL_TABLET | Freq: Every day | ORAL | 1 refills | Status: AC

## 2024-05-01 NOTE — Assessment & Plan Note (Signed)
 Recommend low carb diet Recheck A1c

## 2024-05-01 NOTE — Progress Notes (Signed)
 Complete physical exam   Patient: Rachael Brewer   DOB: Mar 07, 1961   63 y.o. Female  MRN: [id number] Visit Date: 05/01/2024  Today's healthcare provider: Jon Eva, MD   Chief Complaint  Patient presents with   Annual Exam    Last completed 01/14/23 Diet -  General Exercise - walks dog a few times a week Feeling - okay Sleeping - fairly well due to trouble falling asleep sometimes Concerns -  Neurology advised patient you would need to contact them if you feel imaging needs to be done sooner as she reports having migraine daily and sometimes twice daily   Subjective    Rachael Brewer is a 63 y.o. female who presents today for a complete physical exam.   Discussed the use of AI scribe software for clinical note transcription with the patient, who gave verbal consent to proceed.  History of Present Illness   Rachael Brewer is a 63 year old female with migraines and hypertension who presents for an annual physical exam.  She experiences daily migraines, sometimes twice a day, and uses over-the-counter medication with acetaminophen  and aspirin  for relief. She is concerned about rebound headaches from frequent use. She has been without gabapentin  for about a week and a half due to insurance issues. Upon resuming her regular dose, she felt 'drugged' and has since reduced to one per day, which she feels is better.  Her torsemide  intake has been reduced from two to one per day as she no longer experiences fluid retention in her legs. Her blood pressure was elevated today, whereas it was previously well-controlled with torsemide , typically in the 120s to low 130s.  She has been feeling more depressed over the last month, despite taking Zoloft  and Wellbutrin . She lacks motivation and interest in activities she previously enjoyed, such as painting and attending family events. She has resumed reading fiction and working in her garden. She has increased her Zoloft   dose back to 50 mg daily after previously reducing it.  She experiences a recurring rash on her left side, which she treats with clobetasol  cream, resolving the rash within a day or two. The rash reappears every week or two.  She describes persistent lower back pain with a throbbing sensation radiating from her lower back down her leg to her knee, sometimes disrupting her sleep. A prior orthopedic evaluation and MRI indicated a pinched nerve.  She recently visited her mother, who had been in the hospital and rehab, and stayed for three weeks to assist her. She drove 18 hours over two days to reach her mother's home, staying at a pet-friendly hotel halfway.        Last depression screening scores    05/01/2024   10:14 AM 01/26/2024   10:12 AM 01/14/2023   10:28 AM  PHQ 2/9 Scores  PHQ - 2 Score 6 2 2   PHQ- 9 Score 13 11  11       Data saved with a previous flowsheet row definition   Last fall risk screening    05/01/2024   10:15 AM  Fall Risk   Falls in the past year? 1  Number falls in past yr: 0  Injury with Fall? 0  Risk for fall due to : History of fall(s)  Follow up Falls evaluation completed        Medications: Outpatient Medications Prior to Visit  Medication Sig   albuterol  (VENTOLIN  HFA) 108 (90 Base) MCG/ACT inhaler INHALE 1-2 PUFFS BY  MOUTH EVERY 6 HOURS AS NEEDED FOR WHEEZE OR SHORTNESS OF BREATHINHALE 1-2 PUFFS BY MOUTH EVERY 6 HOURS AS NEEDED FOR WHEEZE OR SHORTNESS OF BREATH   aspirin -acetaminophen -caffeine  (EXCEDRIN  MIGRAINE) 250-250-65 MG tablet Take 2 tablets by mouth every 8 (eight) hours as needed for migraine.    Azelastine  HCl 137 MCG/SPRAY SOLN Place 1 spray into both nostrils at bedtime.   baclofen  (LIORESAL ) 10 MG tablet TAKE 1 TABLET BY MOUTH THREE TIMES A DAY AS NEEDED FOR MUSCLE SPASM   buPROPion  (WELLBUTRIN  XL) 150 MG 24 hr tablet TAKE 1 TABLET BY MOUTH EVERY DAY   buPROPion  (WELLBUTRIN  XL) 300 MG 24 hr tablet TAKE 1 TABLET BY MOUTH EVERY DAY    fluticasone  (FLONASE ) 50 MCG/ACT nasal spray Place 2 sprays into both nostrils daily.   gabapentin  (NEURONTIN ) 600 MG tablet TAKE 1 TABLET BY MOUTH TWICE A DAY   Melatonin 10 MG CAPS Take 20 mg by mouth at bedtime.   meloxicam (MOBIC) 15 MG tablet Take 1 tablet by mouth daily.   Multiple Vitamin (MULTIVITAMIN WITH MINERALS) TABS tablet Take 1 tablet by mouth daily.   mupirocin  ointment (BACTROBAN ) 2 % Apply 1 Application topically 2 (two) times daily.   omeprazole  (PRILOSEC) 40 MG capsule TAKE 1 CAPSULE (40 MG TOTAL) BY MOUTH DAILY.   rizatriptan  (MAXALT ) 10 MG tablet Take 10 mg by mouth every 2 (two) hours as needed for migraine (max 2 tablets/24 hrs.).    torsemide  (DEMADEX ) 20 MG tablet TAKE 2 TABLETS (40 MG TOTAL) BY MOUTH DAILY. (Patient taking differently: Take 40 mg by mouth daily. Taking 1 tablet daily)   [DISCONTINUED] clobetasol  cream (TEMOVATE ) 0.05 % Apply 1 Application topically 2 (two) times daily.   [DISCONTINUED] sertraline  (ZOLOFT ) 50 MG tablet Take 1 tablet (50 mg total) by mouth daily.   [DISCONTINUED] triamcinolone  ointment (KENALOG ) 0.5 % Apply 1 Application topically 2 (two) times daily.   No facility-administered medications prior to visit.    Review of Systems    Objective    BP (!) 153/73 (BP Location: Left Arm, Patient Position: Sitting, Cuff Size: Large)   Pulse 65   Ht 5' 3 (1.6 m)   Wt 234 lb 12.8 oz (106.5 kg)   LMP  (LMP Unknown)   SpO2 100%   BMI 41.59 kg/m    Physical Exam Vitals reviewed.  Constitutional:      General: She is not in acute distress.    Appearance: Normal appearance. She is well-developed. She is not diaphoretic.  HENT:     Head: Normocephalic and atraumatic.     Right Ear: Tympanic membrane, ear canal and external ear normal.     Left Ear: Tympanic membrane, ear canal and external ear normal.     Nose: Nose normal.     Mouth/Throat:     Mouth: Mucous membranes are moist.     Pharynx: Oropharynx is clear. No oropharyngeal  exudate.  Eyes:     General: No scleral icterus.    Conjunctiva/sclera: Conjunctivae normal.     Pupils: Pupils are equal, round, and reactive to light.  Neck:     Thyroid : No thyromegaly.  Cardiovascular:     Rate and Rhythm: Normal rate and regular rhythm.     Heart sounds: Normal heart sounds. No murmur heard. Pulmonary:     Effort: Pulmonary effort is normal. No respiratory distress.     Breath sounds: Normal breath sounds. No wheezing or rales.  Abdominal:     General: There is no distension.  Palpations: Abdomen is soft.     Tenderness: There is no abdominal tenderness.  Musculoskeletal:        General: No deformity.     Cervical back: Neck supple.     Right lower leg: No edema.     Left lower leg: No edema.  Lymphadenopathy:     Cervical: No cervical adenopathy.  Skin:    General: Skin is warm and dry.     Findings: No rash.  Neurological:     Mental Status: She is alert and oriented to person, place, and time. Mental status is at baseline.     Gait: Gait normal.  Psychiatric:        Mood and Affect: Mood is depressed.        Behavior: Behavior normal.        Thought Content: Thought content normal.      No results found for any visits on 05/01/24.  Assessment & Plan    Routine Health Maintenance and Physical Exam  Exercise Activities and Dietary recommendations  Goals       Exercise 150 minutes per week (moderate activity)      I need counseling for myself and my son (pt-stated)      Care Coordination Interventions: Confirmed recent loss of her spouse, normalizing grief response Grief counseling for patient and son recommended, resources for Grief Counseling through Hospice and Palliative Care provided [phone]-patient agreeable to calling to set up initial appointment today Patient discussed probability that her insurance coverage will change, plan to wait for referral for ongoing counseling until insurance coverage can be confirmed Motivational  Interviewing employed PHQ2/PHQ9 completed/reviewed Solution-Focused Strategies employed Active listening / Reflection utilized  Emotional Support Provided in regards to loss of her spouse, verbalization of feelings encouraged, grief response normalized         Immunization History  Administered Date(s) Administered   Hepb-cpg 05/18/2022   Influenza Inj Mdck Quad Pf 03/13/2018   Influenza, Mdck, Trivalent,PF 6+ MOS(egg free) 06/08/2023   Influenza, Seasonal, Injecte, Preservative Fre 05/17/2016   Influenza,inj,Quad PF,6+ Mos 04/02/2017, 02/09/2018, 05/03/2019, 03/29/2022   Influenza,inj,quad, With Preservative 04/02/2017   Influenza-Unspecified 05/17/2016, 04/21/2020   PFIZER(Purple Top)SARS-COV-2 Vaccination 12/15/2019, 01/05/2020, 07/03/2020   PNEUMOCOCCAL CONJUGATE-20 12/16/2020   Pfizer(Comirnaty)Fall Seasonal Vaccine 12 years and older 04/30/2022, 06/08/2023   Respiratory Syncytial Virus Vaccine,Recomb Aduvanted(Arexvy) 04/30/2022   Td 07/22/2000   Tdap 05/11/2017   Zoster Recombinant(Shingrix) 07/17/2019, 12/16/2020, 05/18/2022    Health Maintenance  Topic Date Due   Hepatitis B Vaccines 19-59 Average Risk (2 of 2 - CpG 2-dose series) 06/15/2022   COVID-19 Vaccine (6 - 2025-26 season) 02/20/2024   Mammogram  05/05/2024   Influenza Vaccine  09/18/2024 (Originally 01/20/2024)   DTaP/Tdap/Td (3 - Td or Tdap) 05/12/2027   Cervical Cancer Screening (HPV/Pap Cotest)  01/14/2028   Colonoscopy  02/26/2032   Pneumococcal Vaccine: 50+ Years  Completed   Hepatitis C Screening  Completed   HIV Screening  Completed   Zoster Vaccines- Shingrix  Completed   HPV VACCINES  Aged Out   Meningococcal B Vaccine  Aged Out    Discussed health benefits of physical activity, and encouraged her to engage in regular exercise appropriate for her age and condition.  Problem List Items Addressed This Visit       Other   Depression, recurrent   Increased depressive symptoms over the past  month. Currently on Zoloft  and Wellbutrin . Engaging in activities like reading and gardening, but experiencing decreased motivation and energy. Considering  increasing Zoloft  dosage temporarily to manage symptoms. - Increased Zoloft  to 100 mg daily - Continue counseling sessions - Monitor mood and adjust treatment as needed      Relevant Medications   sertraline  (ZOLOFT ) 100 MG tablet   Morbid obesity (HCC)   Discussed importance of healthy weight management Discussed diet and exercise       Relevant Orders   Comprehensive metabolic panel with GFR   Lipid panel   Prediabetes   Recommend low carb diet Recheck A1c       Relevant Orders   Hemoglobin A1c   Other Visit Diagnoses       Encounter for annual physical exam    -  Primary   Relevant Orders   Hemoglobin A1c   Comprehensive metabolic panel with GFR   Lipid panel     Chronic migraine without aura without status migrainosus, not intractable       Relevant Medications   sertraline  (ZOLOFT ) 100 MG tablet     Allergic dermatitis       Relevant Medications   clobetasol  cream (TEMOVATE ) 0.05 %     Elevated BP reading w/ no diagnosis of HTN               Adult Wellness Visit Routine adult wellness visit with elevated blood pressure, possibly due to reduced torsemide  dosage. Recent mammogram was missed due to migraine and illness. Pap smear was normal last year. Colonoscopy was done two years ago with a 10-year follow-up interval. Shingles and pneumonia vaccinations are up to date. Flu shot is recommended annually. - Rechecked blood pressure at the end of the visit - Reschedule mammogram - Administered flu shot today - Ordered labs for blood sugar, A1c, cholesterol, kidney, and liver function - Printed after-visit summary with mammogram order  Chronic migraine Chronic migraines occurring daily, sometimes twice a day. Concerns about rebound headaches from over-the-counter medications. Gabapentin  was interrupted due to  insurance issues, leading to increased migraine frequency. Currently taking one gabapentin  daily, which is better tolerated. - Continue gabapentin  at one tablet daily for a week or two, then increase to two tablets daily - Discuss with neurology about potential MRI if migraine pattern changes  Elevated BP Blood pressure elevated today, possibly due to reduced torsemide  dosage. Torsemide  also aids in reducing leg swelling. - Recheck blood pressure at the end of the visit - this was forgotten, will need to follow up at next visit - Will consider adjusting torsemide  dosage based on blood pressure readings  Chronic low back and right lower extremity radicular pain Chronic low back pain with radicular symptoms down the right leg, consistent with a pinched nerve. Gabapentin  is expected to help with nerve pain once dosage is increased. - Increase gabapentin  to two tablets daily to manage nerve pain  Allergic contact dermatitis Intermittent rash, possibly eczema, responding well to clobetasol  cream. Rash is not related to seatbelt use. - Continue using clobetasol  cream as needed - Discontinued triamcinolone  ointment  General Health Maintenance Routine health maintenance discussed, including vaccinations and screenings. Flu shot recommended annually. COVID vaccine available at pharmacies. - Administered flu shot today - Recommended COVID vaccine at pharmacy - Ensured mammogram is rescheduled - Continue routine health maintenance screenings        Return in about 6 months (around 10/29/2024) for chronic disease f/u.     Jon Eva, MD  [facility] [phone] (phone) [phone] (fax)  [facility]

## 2024-05-01 NOTE — Addendum Note (Signed)
 Addended by: LILIAN SEVERO RAMAN on: 05/01/2024 11:41 AM   Modules accepted: Orders

## 2024-05-01 NOTE — Assessment & Plan Note (Signed)
 Discussed importance of healthy weight management Discussed diet and exercise

## 2024-05-01 NOTE — Assessment & Plan Note (Signed)
 Increased depressive symptoms over the past month. Currently on Zoloft  and Wellbutrin . Engaging in activities like reading and gardening, but experiencing decreased motivation and energy. Considering increasing Zoloft  dosage temporarily to manage symptoms. - Increased Zoloft  to 100 mg daily - Continue counseling sessions - Monitor mood and adjust treatment as needed

## 2024-05-01 NOTE — Patient Instructions (Signed)
 Call [facility] to schedule a mammogram [phone]

## 2024-05-02 LAB — COMPREHENSIVE METABOLIC PANEL WITH GFR
ALT: 15 IU/L (ref 0–32)
AST: 21 IU/L (ref 0–40)
Albumin: 4.5 g/dL (ref 3.9–4.9)
Alkaline Phosphatase: 73 IU/L (ref 49–135)
BUN/Creatinine Ratio: 20 (ref 12–28)
BUN: 19 mg/dL (ref 8–27)
Bilirubin Total: 0.6 mg/dL (ref 0.0–1.2)
CO2: 24 mmol/L (ref 20–29)
Calcium: 9.5 mg/dL (ref 8.7–10.3)
Chloride: 100 mmol/L (ref 96–106)
Creatinine, Ser: 0.97 mg/dL (ref 0.57–1.00)
Globulin, Total: 2.1 g/dL (ref 1.5–4.5)
Glucose: 96 mg/dL (ref 70–99)
Potassium: 4.1 mmol/L (ref 3.5–5.2)
Sodium: 138 mmol/L (ref 134–144)
Total Protein: 6.6 g/dL (ref 6.0–8.5)
eGFR: 65 mL/min/1.73 (ref >59)

## 2024-05-02 LAB — LIPID PANEL
Chol/HDL Ratio: 2.9 ratio (ref 0.0–4.4)
Cholesterol, Total: 147 mg/dL (ref 100–199)
HDL: 51 mg/dL (ref >39)
LDL Chol Calc (NIH): 74 mg/dL (ref 0–99)
Triglycerides: 122 mg/dL (ref 0–149)
VLDL Cholesterol Cal: 22 mg/dL (ref 5–40)

## 2024-05-02 LAB — HEMOGLOBIN A1C
Est. average glucose Bld gHb Est-mCnc: 111 mg/dL
Hgb A1c MFr Bld: 5.5 % (ref 4.8–5.6)

## 2024-05-03 ENCOUNTER — Ambulatory Visit: Payer: Self-pay | Admitting: Family Medicine

## 2024-05-08 ENCOUNTER — Other Ambulatory Visit: Payer: Self-pay | Admitting: Family Medicine

## 2024-05-08 DIAGNOSIS — M62830 Muscle spasm of back: Secondary | ICD-10-CM

## 2024-05-08 DIAGNOSIS — M545 Low back pain, unspecified: Secondary | ICD-10-CM

## 2024-05-09 DIAGNOSIS — M5416 Radiculopathy, lumbar region: Secondary | ICD-10-CM | POA: Diagnosis not present

## 2024-05-24 NOTE — Patient Outreach (Signed)
 RNCM - patient was scheduled for initial in person visit at [facility] tomorrow 05/25/24 at 11 am. Message left requesting call back to either reschedule or change visit to phone due to expected inclement weather.

## 2024-05-25 ENCOUNTER — Ambulatory Visit: Payer: Self-pay

## 2024-05-25 ENCOUNTER — Telehealth: Payer: Self-pay

## 2024-05-25 NOTE — Patient Outreach (Signed)
 RNCM - second message left regarding need to reschedule in person appointment today at [facility], 11am. Made aware will call at 11 am if she would like to complete visit via phone or reschedule. Contact info again provided. Office aware.

## 2024-05-30 ENCOUNTER — Encounter: Payer: Self-pay | Admitting: Adult Health

## 2024-05-30 ENCOUNTER — Telehealth: Payer: Self-pay

## 2024-05-30 ENCOUNTER — Ambulatory Visit (INDEPENDENT_AMBULATORY_CARE_PROVIDER_SITE_OTHER): Admitting: Adult Health

## 2024-05-30 VITALS — BP 157/79 | HR 65 | Ht 63.0 in | Wt 227.0 lb

## 2024-05-30 DIAGNOSIS — G43009 Migraine without aura, not intractable, without status migrainosus: Secondary | ICD-10-CM

## 2024-05-30 DIAGNOSIS — G4733 Obstructive sleep apnea (adult) (pediatric): Secondary | ICD-10-CM | POA: Diagnosis not present

## 2024-05-30 NOTE — Progress Notes (Signed)
 PATIENT: Rachael Brewer DOB: 02-02-1961  REASON FOR VISIT: follow up HISTORY FROM: patient   Chief Complaint  Patient presents with   Obstructive Sleep Apnea    RM 18 alone  Pt is well, reports she is having 4-5 migraines a week. No OSA/CPAP concerns.      HISTORY OF PRESENT ILLNESS: Today 05/30/24:  Rachael Brewer is a 63 y.o. female with a history of OSA on CPAP and Migraines. Returns today for follow-up. Reports that CPAP is working well. Uses a dreamwear mask. Reports that her migraines have gotten worse. Headaches are currently located on the right side and center. Reports during her headaches her face aches. Could be sinus related? Taking excedrin  migraine 4x week. Will use rizatriptan  at night. Has tried Topamax . Currently on gabapentin  but only taking 600 mg at bedtime. Reports that she has been depressed. Talking with her PCP and therapist.    Location: right side/center, causes face to ache could be sinus related Frequency: 5/week Duration: during the day- take OTC excedrin  migraine (4x week), at night she will take rizatriptan .  Aura: no Photophonia:yes  Phonophobia:yes  Nausea: yes  Vomiting: no Numbness: no Weakness: no Visual changes:no   Imaging: 10/27/20 MRI brain: IMPRESSION: Mildly motion degraded examination.   No evidence of acute intracranial abnormality.   No cerebellopontine angle or internal auditory canal mass.   Mild multifocal T2/FLAIR hyperintensity within the cerebral white matter, nonspecific but most often secondary to chronic small vessel ischemia.   Mild paranasal sinus disease, as described.  Cardiac history: no      10/19/23: Rachael Brewer is a 63 y.o. female with a history of OSA on CPAP and migraine headaches. Returns today for follow-up. Overall she reports that her Headaches are manageable. Haivng about 2 headaches a week- usually wakes up with a headache or gets one late in the afternoon.  She is not sure if their  is a correlation to her morning headaches and CPAP usage. Has remained on gabapentin  600 mg BID.Will use excedrin - resolves headaches within 20-30 minutes.CPAP is working well. Denies any issues with the machine. Has tried to use it more.   At the end of the visit- she mentioned some trouble with her memory. States that PCP is aware and did blood work?    02/17/23: Rachael Brewer is a 63 y.o. female with a history of OSA on CPAP. Returns today for follow-up.  She states that her headaches have improved since she has been consistent with CPAP.  She still struggles sometimes falling asleep at night.  She does not have a set bedtime routine.  She states that she often snacks a lot at bedtime.  Her download is below.   10/26/22: Rachael Brewer is a 63 y.o. female who has been followed in this office for Migraine headaches. Returns today for follow-up. Reports that she is having migraines  Every other day- Wake up with most headaches. Will take excedrin  for migraines. Weaned herself off Qudexy  as she didn't find it beneficial. Continues on Gabapentin  and baclofen .   Has a diagnosis of obstructive sleep apnea but this is not managed by our office.  She states that she has not used her machine in over 9 months.  She also states that since she has been put on machine she has not had any regular follow-ups with a physician to review her data   HISTORY Brief HPI: 63 year old female with a history of anxiety, depression, OSA  who follows in clinic for migraines. MRI brain 10/27/20 was unremarkable.   At her last visit Qudexy  was increased to 100 mg daily and continued gabapentin  for headache prevention.  Also discussed avoidance of OTC overuse     Interval History: She currently experiences migraine about every other day typically upon awakening.  She does believe headache severity has improved since increasing Qudexy  dosage.  She will occasionally experience 1 in the afternoon.  She will typically take  Excedrin  with morning headache and rizatriptan  with evening headache.  She has not been using her CPAP routinely over the past few months as her house is currently under holiday representative.  She continues to work with PT for cervicalgia and dry needling which has been greatly beneficial.  She continues to experience significant family stressors especially since the passing of her husband a year ago, believes she has been able to start the grieving process over the past 6 months.  She routinely participates with grief counseling which has been beneficial.  She does complain of memory loss, will misplace items around her home, will forget driving directions to known locations, forgot she already saw renovations done at her friend's home. Believes this has been more present over the past 6 months.        Headache days per month: 15-30 Headache free days per month: 0-15   Current Headache Regimen: Preventative: Qudexy  100 mg daily, gabapentin  600 mg BID Abortive: Excedrin , Maxalt  10 mg 7PRN, baclofen  PRN   Prior Therapies                                  Rescue: Excedrin  BC powder Cambia Frovatriptan Imitrex  Maxalt  Relpax Zomig Reglan  Baclofen   Flexeril  Preventive: Gabapentin  Topamax  Citalopram Escitalopram  Sertraline  Paroxetine Occipital nerve block Trigger point injections  REVIEW OF SYSTEMS: Out of a complete 14 system review of symptoms, the patient complains only of the following symptoms, and all other reviewed systems are negative.  ESS 9   ALLERGIES: Allergies  Allergen Reactions   Bactrim [Sulfamethoxazole-Trimethoprim] Shortness Of Breath and Swelling   Sulfa Antibiotics Anaphylaxis   Penicillamine Hives   Penicillins Hives   Sumatriptan      Made migraine more intense and scalp felt like it was on fire    HOME MEDICATIONS: Outpatient Medications Prior to Visit  Medication Sig Dispense Refill   albuterol  (VENTOLIN  HFA) 108 (90 Base) MCG/ACT inhaler INHALE 1-2 PUFFS  BY MOUTH EVERY 6 HOURS AS NEEDED FOR WHEEZE OR SHORTNESS OF BREATHINHALE 1-2 PUFFS BY MOUTH EVERY 6 HOURS AS NEEDED FOR WHEEZE OR SHORTNESS OF BREATH 18 each 3   aspirin -acetaminophen -caffeine  (EXCEDRIN  MIGRAINE) 250-250-65 MG tablet Take 2 tablets by mouth every 8 (eight) hours as needed for migraine.      Azelastine  HCl 137 MCG/SPRAY SOLN Place 1 spray into both nostrils at bedtime.     baclofen  (LIORESAL ) 10 MG tablet TAKE 1 TABLET BY MOUTH THREE TIMES A DAY AS NEEDED FOR MUSCLE SPASM 90 tablet 2   buPROPion  (WELLBUTRIN  XL) 150 MG 24 hr tablet TAKE 1 TABLET BY MOUTH EVERY DAY 90 tablet 2   buPROPion  (WELLBUTRIN  XL) 300 MG 24 hr tablet TAKE 1 TABLET BY MOUTH EVERY DAY 90 tablet 2   clobetasol  cream (TEMOVATE ) 0.05 % Apply 1 Application topically 2 (two) times daily. 30 g 5   fluticasone  (FLONASE ) 50 MCG/ACT nasal spray Place 2 sprays into both nostrils daily. 16 g 11   gabapentin  (  NEURONTIN ) 600 MG tablet TAKE 1 TABLET BY MOUTH TWICE A DAY 60 tablet 5   Melatonin 10 MG CAPS Take 20 mg by mouth at bedtime.     meloxicam (MOBIC) 15 MG tablet Take 1 tablet by mouth daily.     Multiple Vitamin (MULTIVITAMIN WITH MINERALS) TABS tablet Take 1 tablet by mouth daily.     mupirocin  ointment (BACTROBAN ) 2 % Apply 1 Application topically 2 (two) times daily. 22 g 0   omeprazole  (PRILOSEC) 40 MG capsule TAKE 1 CAPSULE (40 MG TOTAL) BY MOUTH DAILY. 90 capsule 2   rizatriptan  (MAXALT ) 10 MG tablet Take 10 mg by mouth every 2 (two) hours as needed for migraine (max 2 tablets/24 hrs.).   0   sertraline  (ZOLOFT ) 100 MG tablet Take 1 tablet (100 mg total) by mouth daily. 90 tablet 1   torsemide  (DEMADEX ) 20 MG tablet TAKE 2 TABLETS (40 MG TOTAL) BY MOUTH DAILY. (Patient taking differently: Take 40 mg by mouth daily. Taking 1 tablet daily) 60 tablet 5   No facility-administered medications prior to visit.    PAST MEDICAL HISTORY: Past Medical History:  Diagnosis Date   Anxiety    Arthritis    Complication  of anesthesia    CAUSES MIGRAINE   Depression    Diabetes (HCC)    Edema of both lower extremities    GERD (gastroesophageal reflux disease)    Headache    Menetrier disease    Migraines    Pre-diabetes    Sleep apnea    CPAP    PAST SURGICAL HISTORY: Past Surgical History:  Procedure Laterality Date   ANTERIOR CRUCIATE LIGAMENT REPAIR Right 1996   COLONOSCOPY WITH PROPOFOL  N/A 02/25/2022   Procedure: COLONOSCOPY WITH PROPOFOL ;  Surgeon: Jinny Carmine, MD;  [facility];  Service: Endoscopy;  Laterality: N/A;   DILATION AND CURETTAGE OF UTERUS  2004   FOOT SURGERY Bilateral    JOINT REPLACEMENT Right    KNEE ARTHROPLASTY Right 05/02/2019   Procedure: COMPUTER ASSISTED TOTAL KNEE ARTHROPLASTY;  Surgeon: Mardee Lynwood SQUIBB, MD;  Location: [facility];  Service: Orthopedics;  Laterality: Right;   KNEE ARTHROPLASTY Left 07/09/2019   Procedure: COMPUTER ASSISTED TOTAL KNEE ARTHROPLASTY;  Surgeon: Mardee Lynwood SQUIBB, MD;  Location: [facility];  Service: Orthopedics;  Laterality: Left;   KNEE SURGERY Right    MENISCUS REPAIR Bilateral     FAMILY HISTORY: Family History  Problem Relation Age of Onset   Hypertension Brother    Diabetes Brother    Diabetes Other    Hypertension Other    Glaucoma Other    Breast cancer Maternal Aunt        55's    SOCIAL HISTORY: Social History   Socioeconomic History   Marital status: Widowed    Spouse name: Not on file   Number of children: 1   Years of education: Not on file   Highest education level: Bachelor's degree (e.g., BA, AB, BS)  Occupational History   Not on file  Tobacco Use   Smoking status: Former    Current packs/day: 0.00    Average packs/day: 1 pack/day for 15.0 years (15.0 ttl pk-yrs)    Types: Cigarettes    Start date: 06/20/1984    Quit date: 06/21/1999    Years since quitting: 24.9   Smokeless tobacco: Never  Vaping Use   Vaping status: Never Used  Substance and Sexual Activity   Alcohol use: Yes     Alcohol/week: 0.0 standard drinks  of alcohol    Comment: occasional   Drug use: No   Sexual activity: Yes    Birth control/protection: None  Other Topics Concern   Not on file  Social History Narrative   1 adopted boy and 2 grandsons live with her- per patient the boys moved back with their mother 11/2021   Caffeine - coffee 1 c daily   Patient loss he husband November 28,2022.   Lives only with her son.   Right handed   Social Drivers of Health   Financial Resource Strain: Low Risk  (07/17/2023)   Overall Financial Resource Strain (CARDIA)    Difficulty of Paying Living Expenses: Not hard at all  Food Insecurity: No Food Insecurity (07/17/2023)   Hunger Vital Sign    Worried About Running Out of Food in the Last Year: Never true    Ran Out of Food in the Last Year: Never true  Transportation Needs: No Transportation Needs (07/17/2023)   PRAPARE - Administrator, Civil Service (Medical): No    Lack of Transportation (Non-Medical): No  Physical Activity: Insufficiently Active (07/17/2023)   Exercise Vital Sign    Days of Exercise per Week: 2 days    Minutes of Exercise per Session: 20 min  Stress: Stress Concern Present (07/17/2023)   [employer] of Occupational Health - Occupational Stress Questionnaire    Feeling of Stress : Rather much  Social Connections: Moderately Integrated (07/17/2023)   Social Connection and Isolation Panel    Frequency of Communication with Friends and Family: More than three times a week    Frequency of Social Gatherings with Friends and Family: Once a week    Attends Religious Services: More than 4 times per year    Active Member of [employer] or Organizations: Yes    Attends Banker Meetings: More than 4 times per year    Marital Status: Widowed  Intimate Partner Violence: Not on file      PHYSICAL EXAM  Vitals:   05/30/24 1107  Weight: 227 lb (103 kg)  Height: 5' 3 (1.6 m)   Body mass index is 40.21  kg/m.  Generalized: Well developed, in no acute distress   Neurological examination  Mentation: Alert oriented to time, place, history taking. Follows all commands speech and language fluent Cranial nerve II-XII: Pupils were equal round reactive to light. Extraocular movements were full, visual field were full on confrontational test. Facial sensation and strength were normal. Uvula tongue midline. Head turning and shoulder shrug  were normal and symmetric. Motor: The motor testing reveals 5 over 5 strength of all 4 extremities. Good symmetric motor tone is noted throughout.  Sensory: Sensory testing is intact to soft touch on all 4 extremities. No evidence of extinction is noted.  Coordination: Cerebellar testing reveals good finger-nose-finger and heel-to-shin bilaterally.  Gait and station: Gait is normal.  Reflexes: Deep tendon reflexes are symmetric and normal bilaterally.   DIAGNOSTIC DATA (LABS, IMAGING, TESTING) - I reviewed patient records, labs, notes, testing and imaging myself where available.  Lab Results  Component Value Date   WBC 5.6 12/02/2023   HGB 16.5 (H) 12/02/2023   HCT 50.8 (H) 12/02/2023   MCV 96 12/02/2023   PLT 295 12/02/2023      Component Value Date/Time   NA 138 05/01/2024 1055   K 4.1 05/01/2024 1055   CL 100 05/01/2024 1055   CO2 24 05/01/2024 1055   GLUCOSE 96 05/01/2024 1055   GLUCOSE 104 (H) 12/26/2019  0426   BUN 19 05/01/2024 1055   CREATININE 0.97 05/01/2024 1055   CREATININE 0.92 05/13/2017 0855   CALCIUM 9.5 05/01/2024 1055   PROT 6.6 05/01/2024 1055   ALBUMIN 4.5 05/01/2024 1055   AST 21 05/01/2024 1055   ALT 15 05/01/2024 1055   ALKPHOS 73 05/01/2024 1055   BILITOT 0.6 05/01/2024 1055   GFRNONAA 65 07/03/2020 1134   GFRNONAA 69 05/13/2017 0855   GFRAA 75 07/03/2020 1134   GFRAA 80 05/13/2017 0855   Lab Results  Component Value Date   CHOL 147 05/01/2024   HDL 51 05/01/2024   LDLCALC 74 05/01/2024   TRIG 122 05/01/2024    CHOLHDL 2.9 05/01/2024   Lab Results  Component Value Date   HGBA1C 5.5 05/01/2024   Lab Results  Component Value Date   VITAMINB12 649 12/02/2023   Lab Results  Component Value Date   TSH 3.110 12/02/2023      ASSESSMENT AND PLAN 63 y.o. year old female  has a past medical history of Anxiety, Arthritis, Complication of anesthesia, Depression, Diabetes (HCC), Edema of both lower extremities, GERD (gastroesophageal reflux disease), Headache, Menetrier disease, Migraines, Pre-diabetes, and Sleep apnea. here with:  1.  Migraine headaches  -- Consider Qulipta 60 mg daily. Information provided on AVS -- Limit use of Excedrin  to 2x/week -- Continue gabapentin  600 mg at bedtime. Can try to wean off gabapentin  in the future  2.  Obstructive sleep apnea on CPAP  -- Compliance good -- Residual AHI in normal range -- Continue CPAP nightly and greater than 4 hours each night -- Encouraged the patient to try to use the machine nightly and >7 hours every night   Follow-up in 6 months or sooner if needed     Duwaine Russell, MSN, NP-C 05/30/2024, 11:11 AM [facility] [address], [address] [place], [place] [address] [phone]  The patient's condition requires frequent monitoring and adjustments in the treatment plan, reflecting the ongoing complexity of care.  This provider is the continuing focal point for all needed services for this condition.

## 2024-05-30 NOTE — Progress Notes (Signed)
 Complex Care Management Care Guide Note  05/30/2024 Name: Rachael Brewer MRN: [id number] DOB: 02-17-1961  Rachael Brewer is a 63 y.o. year old female who is a primary care patient of Bacigalupo, Jon CHRISTELLA, MD and is actively engaged with the care management team. I reached out to Montie CHRISTELLA Norlander by phone today to assist with re-scheduling  with the RN Case Manager.  Follow up plan: Telephone appointment with complex care management team member scheduled for:  06/18/24 at 2:00 a.m.   Dreama Lynwood [facility]  [facility] VBCI Assistant Direct Dial: [phone]  Fax: [phone]

## 2024-05-30 NOTE — Patient Instructions (Signed)
 Your Plan:  Consider Qulipta  If this works then we can wean you off gabapentin   Continue CPAP Limit use of excedrin  to 2/week  Thank you for coming to see us  at [facility]. I hope we have been able to provide you high quality care today.  You may receive a patient satisfaction survey over the next few weeks. We would appreciate your feedback and comments so that we may continue to improve ourselves and the health of our patients.

## 2024-06-04 ENCOUNTER — Telehealth: Payer: Self-pay

## 2024-06-04 ENCOUNTER — Other Ambulatory Visit (HOSPITAL_COMMUNITY): Payer: Self-pay

## 2024-06-04 MED ORDER — QULIPTA 60 MG PO TABS: 60.0000 mg | ORAL_TABLET | Freq: Every day | ORAL | 3 refills | Status: AC

## 2024-06-04 NOTE — Telephone Encounter (Signed)
 Pharmacy Patient Advocate Encounter   Received notification from CoverMyMeds that prior authorization for Qulipta  is required/requested.   Insurance verification completed.   The patient is insured through [employer].   Per test claim: PA required; PA submitted to above mentioned insurance via Latent Key/confirmation #/EOC [id number] Status is pending

## 2024-06-05 NOTE — Telephone Encounter (Signed)
 Pharmacy Patient Advocate Encounter  Received notification from [employer] that Prior Authorization for Qulipta  has been DENIED.  Full denial letter will be uploaded to the media tab. See denial reason below.   PA #/Case ID/Reference #: [id number]

## 2024-06-09 ENCOUNTER — Other Ambulatory Visit: Payer: Self-pay | Admitting: Adult Health

## 2024-06-11 NOTE — Telephone Encounter (Signed)
 Spoke with patient and discussed the Qulipta  denial by insurance with preferred drugs: CGRP injections.  The patient is amenable to trying an injection instead of the tablet since the cash cost for the Qulipta  is so high.  We discussed the most common side effect of the injections and his injection site reaction.  We discussed possible injection sites that she can use and I let her know that each pen will be included with detailed information. Also informed her that injections will be need to be refrigerated until ready for use. The patient's questions were answered and she thanked me for the call.   Per denial letter, pt will have to try two of the CGRP injections before Qulipta  can be approved.

## 2024-06-12 MED ORDER — EMGALITY 120 MG/ML ~~LOC~~ SOAJ: 120.0000 mg | SUBCUTANEOUS | 11 refills | Status: AC

## 2024-06-12 NOTE — Addendum Note (Signed)
 Addended by: SHERRYL DUWAINE SQUIBB on: 06/12/2024 04:55 PM   Modules accepted: Orders

## 2024-06-12 NOTE — Telephone Encounter (Signed)
 Let's try Emgality  120 mg

## 2024-06-13 ENCOUNTER — Telehealth: Payer: Self-pay

## 2024-06-13 NOTE — Telephone Encounter (Signed)
 Pharmacy Patient Advocate Encounter  Received notification from [employer] that Prior Authorization for Emgality  has been DENIED.  Full denial letter will be uploaded to the media tab. See denial reason below.   PA #/Case ID/Reference #: [id number]

## 2024-06-13 NOTE — Telephone Encounter (Signed)
 Pharmacy Patient Advocate Encounter   Received notification from CoverMyMeds that prior authorization for Emgality  is required/requested.   Insurance verification completed.   The patient is insured through [employer].   Per test claim: PA required; PA submitted to above mentioned insurance via Latent Key/confirmation #/EOC [id number] Status is pending

## 2024-06-14 ENCOUNTER — Other Ambulatory Visit: Payer: Self-pay | Admitting: Adult Health

## 2024-06-18 ENCOUNTER — Other Ambulatory Visit: Payer: Self-pay

## 2024-06-18 NOTE — Telephone Encounter (Signed)
   I will forward to the appeals pharmacist for review.
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
## 2024-06-18 NOTE — Patient Instructions (Signed)
 Visit Information  Ms. Windholz was given information about Medicaid Managed Care team care coordination services as a part of their [place] [employer] benefit.   If you would like to schedule transportation through your [place] plan, please call the following number at least 2 days in advance of your appointment: [phone].   There is no limit to the number of trips during the year between medical appointments, healthcare facilities, or pharmacies. Transportation must be scheduled at least 2 business days before but not more than thirty 30 days before of your appointment.  Call the Behavioral Health Crisis Line at [phone], at any time, 24 hours a day, 7 days a week. If you are in danger or need immediate medical attention call 911.   Please see education materials related to HTN, Pre-Diabetes, Advance Directives, Fall Prevention Migraines provided by MyChart link.  Patient verbalizes understanding of instructions and care plan provided today and agrees to view in MyChart. Active MyChart status and patient understanding of how to access instructions and care plan via MyChart confirmed with patient.     Telephone follow up appointment with Managed Medicaid care management team member scheduled for: 07/06/2024 at 11:00 am  [name], MSN, RN [place]  [facility], [facility] RN Care Manager Direct Dial: [phone] Fax: [phone]   Following is a copy of your plan of care:   Goals Addressed             This Visit's Progress    [facility] RN Care Plan - HTN       Problems:  Chronic Disease Management support and education needs related to HTN  Goal: Over the next 90 days the Patient will attend all scheduled medical appointments: Patient will att end all appointments as evidenced by chart review        continue to work with RN Care Manager and/or Social Worker to address care management and care coordination needs  related to HTN as evidenced by adherence to care management team scheduled appointments     take all medications exactly as prescribed and will call provider for medication related questions as evidenced by patient report    verbalize basic understanding of HTN disease process and self health management plan as evidenced by taking medications as prescribed, promptly reporting concerns to provider, check BP at least 3x week and record, bring to PCP appointments, lifestyle modifications including diet and exercise, stress management (continue to see therapist, see family therapist, consider psychiatrist for med management)  Interventions:   Hypertension Interventions: Last practice recorded BP readings:  BP Readings from Last 3 Encounters:  05/30/24 (!) 157/79  05/01/24 (!) 153/73  01/26/24 119/79   Most recent eGFR/CrCl:  Lab Results  Component Value Date   EGFR 65 05/01/2024    No components found for: CRCL  Provided education to patient re: stroke prevention, s/s of heart attack and stroke Reviewed medications with patient and discussed importance of compliance Counseled on the importance of exercise goals with target of 150 minutes per week Discussed plans with patient for ongoing care management follow up and provided patient with direct contact information for care management team Advised patient, providing education and rationale, to monitor blood pressure daily and record, calling PCP for findings outside established parameters Provided education on prescribed diet hearty healthy/low carb (pre-DM) Discussed complications of poorly controlled blood pressure such as heart disease, stroke, circulatory complications, vision complications, kidney impairment, sexual dysfunction Screening for signs and symptoms of depression related to  chronic disease state  Assessed social determinant of health barriers  Patient Self-Care Activities:  Attend all scheduled provider appointments Call  pharmacy for medication refills 3-7 days in advance of running out of medications Call provider office for new concerns or questions  Take medications as prescribed   check blood pressure 3 times per week learn about high blood pressure keep a blood pressure log take blood pressure log to all doctor appointments call doctor for signs and symptoms of high blood pressure keep all doctor appointments begin an exercise program report new symptoms to your doctor eat more whole grains, fruits and vegetables, lean meats and healthy fats limit salt intake Stress management - discuss techniques with therapist Stretching daily to help manage migraines, call Neuro if no improvement with Emgality   Plan:  Telephone follow up appointment with care management team member scheduled for:  07/06/2024 at 11:00 am The patient has been provided with contact information for the care management team and has been advised to call with any health related questions or concerns.               Managing Your Hypertension Hypertension, also called high blood pressure, is when the force of the blood pressing against the walls of the arteries is too strong. Arteries are blood vessels that carry blood from your heart throughout your body. Hypertension forces the heart to work harder to pump blood and may cause the arteries to become narrow or stiff. Understanding blood pressure readings A blood pressure reading includes a higher number over a lower number: The first, or top, number is called the systolic pressure. It is a measure of the pressure in your arteries as your heart beats. The second, or bottom number, is called the diastolic pressure. It is a measure of the pressure in your arteries as the heart relaxes. For most people, a normal blood pressure is below 120/80. Your personal target blood pressure may vary depending on your medical conditions, your age, and other factors. Blood pressure is classified into  four stages. Based on your blood pressure reading, your health care provider may use the following stages to determine what type of treatment you need, if any. Systolic pressure and diastolic pressure are measured in a unit called millimeters of mercury (mmHg). Normal Systolic pressure: below 120. Diastolic pressure: below 80. Elevated Systolic pressure: 120-129. Diastolic pressure: below 80. Hypertension stage 1 Systolic pressure: 130-139. Diastolic pressure: 80-89. Hypertension stage 2 Systolic pressure: 140 or above. Diastolic pressure: 90 or above. How can this condition affect me? Managing your hypertension is very important. Over time, hypertension can damage the arteries and decrease blood flow to parts of the body, including the brain, heart, and kidneys. Having untreated or uncontrolled hypertension can lead to: A heart attack. A stroke. A weakened blood vessel (aneurysm). Heart failure. Kidney damage. Eye damage. Memory and concentration problems. Vascular dementia. What actions can I take to manage this condition? Hypertension can be managed by making lifestyle changes and possibly by taking medicines. Your health care provider will help you make a plan to bring your blood pressure within a normal range. You may be referred for counseling on a healthy diet and physical activity. Nutrition  Eat a diet that is high in fiber and potassium, and low in salt (sodium), added sugar, and fat. An example eating plan is called the DASH diet. DASH stands for Dietary Approaches to Stop Hypertension. To eat this way: Eat plenty of fresh fruits and vegetables. Try to fill  one-half of your plate at each meal with fruits and vegetables. Eat whole grains, such as whole-wheat pasta, brown rice, or whole-grain bread. Fill about one-fourth of your plate with whole grains. Eat low-fat dairy products. Avoid fatty cuts of meat, processed or cured meats, and poultry with skin. Fill about one-fourth  of your plate with lean proteins such as fish, chicken without skin, beans, eggs, and tofu. Avoid pre-made and processed foods. These tend to be higher in sodium, added sugar, and fat. Reduce your daily sodium intake. Many people with hypertension should eat less than 1,500 mg of sodium a day. Lifestyle  Work with your health care provider to maintain a healthy body weight or to lose weight. Ask what an ideal weight is for you. Get at least 30 minutes of exercise that causes your heart to beat faster (aerobic exercise) most days of the week. Activities may include walking, swimming, or biking. Include exercise to strengthen your muscles (resistance exercise), such as weight lifting, as part of your weekly exercise routine. Try to do these types of exercises for 30 minutes at least 3 days a week. Do not use any products that contain nicotine or tobacco. These products include cigarettes, chewing tobacco, and vaping devices, such as e-cigarettes. If you need help quitting, ask your health care provider. Control any long-term (chronic) conditions you have, such as high cholesterol or diabetes. Identify your sources of stress and find ways to manage stress. This may include meditation, deep breathing, or making time for fun activities. Alcohol use Do not drink alcohol if: Your health care provider tells you not to drink. You are pregnant, may be pregnant, or are planning to become pregnant. If you drink alcohol: Limit how much you have to: 0-1 drink a day for women. 0-2 drinks a day for men. Know how much alcohol is in your drink. In the [place] one drink equals one 12 oz bottle of beer (355 mL), one 5 oz glass of wine (148 mL), or one 1 oz glass of hard liquor (44 mL). Medicines Your health care provider may prescribe medicine if lifestyle changes are not enough to get your blood pressure under control and if: Your systolic blood pressure is 130 or higher. Your diastolic blood pressure is 80 or  higher. Take medicines only as told by your health care provider. Follow the directions carefully. Blood pressure medicines must be taken as told by your health care provider. The medicine does not work as well when you skip doses. Skipping doses also puts you at risk for problems. Monitoring Before you monitor your blood pressure: Do not smoke, drink caffeinated beverages, or exercise within 30 minutes before taking a measurement. Use the bathroom and empty your bladder (urinate). Sit quietly for at least 5 minutes before taking measurements. Monitor your blood pressure at home as told by your health care provider. To do this: Sit with your back straight and supported. Place your feet flat on the floor. Do not cross your legs. Support your arm on a flat surface, such as a table. Make sure your upper arm is at heart level. Each time you measure, take two or three readings one minute apart and record the results. You may also need to have your blood pressure checked regularly by your health care provider. General information Talk with your health care provider about your diet, exercise habits, and other lifestyle factors that may be contributing to hypertension. Review all the medicines you take with your health care provider because  there may be side effects or interactions. Keep all follow-up visits. Your health care provider can help you create and adjust your plan for managing your high blood pressure. Where to find more information National Heart, Lung, and Blood Institute: popsteam.is American Heart Association: www.heart.org Contact a health care provider if: You think you are having a reaction to medicines you have taken. You have repeated (recurrent) headaches. You feel dizzy. You have swelling in your ankles. You have trouble with your vision. Get help right away if: You develop a severe headache or confusion. You have unusual weakness or numbness, or you feel faint. You  have severe pain in your chest or abdomen. You vomit repeatedly. You have trouble breathing. These symptoms may be an emergency. Get help right away. Call 911. Do not wait to see if the symptoms will go away. Do not drive yourself to the hospital. Summary Hypertension is when the force of blood pumping through your arteries is too strong. If this condition is not controlled, it may put you at risk for serious complications. Your personal target blood pressure may vary depending on your medical conditions, your age, and other factors. For most people, a normal blood pressure is less than 120/80. Hypertension is managed by lifestyle changes, medicines, or both. Lifestyle changes to help manage hypertension include losing weight, eating a healthy, low-sodium diet, exercising more, stopping smoking, and limiting alcohol. This information is not intended to replace advice given to you by your health care provider. Make sure you discuss any questions you have with your health care provider. Document Revised: 02/19/2021 Document Reviewed: 02/19/2021 Elsevier Patient Education  2024 Elsevier Inc.   DASH Eating Plan DASH stands for Dietary Approaches to Stop Hypertension. The DASH eating plan is a healthy eating plan that has been shown to: Lower high blood pressure (hypertension). Reduce your risk for type 2 diabetes, heart disease, and stroke. Help with weight loss. What are tips for following this plan? Reading food labels Check food labels for the amount of salt (sodium) per serving. Choose foods with less than 5 percent of the Daily Value (DV) of sodium. In general, foods with less than 300 milligrams (mg) of sodium per serving fit into this eating plan. To find whole grains, look for the word whole as the first word in the ingredient list. Shopping Buy products labeled as low-sodium or no salt added. Buy fresh foods. Avoid canned foods and pre-made or frozen meals. Cooking Try not to  add salt when you cook. Use salt-free seasonings or herbs instead of table salt or sea salt. Check with your health care provider or pharmacist before using salt substitutes. Do not fry foods. Cook foods in healthy ways, such as baking, boiling, grilling, roasting, or broiling. Cook using oils that are good for your heart. These include olive, canola, avocado, soybean, and sunflower oil. Meal planning  Eat a balanced diet. This should include: 4 or more servings of fruits and 4 or more servings of vegetables each day. Try to fill half of your plate with fruits and vegetables. 6-8 servings of whole grains each day. 6 or less servings of lean meat, poultry, or fish each day. 1 oz is 1 serving. A 3 oz (85 g) serving of meat is about the same size as the palm of your hand. One egg is 1 oz (28 g). 2-3 servings of low-fat dairy each day. One serving is 1 cup (237 mL). 1 serving of nuts, seeds, or beans 5 times each week.  2-3 servings of heart-healthy fats. Healthy fats called omega-3 fatty acids are found in foods such as walnuts, flaxseeds, fortified milks, and eggs. These fats are also found in cold-water  fish, such as sardines, salmon, and mackerel. Limit how much you eat of: Canned or prepackaged foods. Food that is high in trans fat, such as fried foods. Food that is high in saturated fat, such as fatty meat. Desserts and other sweets, sugary drinks, and other foods with added sugar. Full-fat dairy products. Do not salt foods before eating. Do not eat more than 4 egg yolks a week. Try to eat at least 2 vegetarian meals a week. Eat more home-cooked food and less restaurant, buffet, and fast food. Lifestyle When eating at a restaurant, ask if your food can be made with less salt or no salt. If you drink alcohol: Limit how much you have to: 0-1 drink a day if you are female. 0-2 drinks a day if you are female. Know how much alcohol is in your drink. In the [place] one drink is one 12 oz bottle of  beer (355 mL), one 5 oz glass of wine (148 mL), or one 1 oz glass of hard liquor (44 mL). General information Avoid eating more than 2,300 mg of salt a day. If you have hypertension, you may need to reduce your sodium intake to 1,500 mg a day. Work with your provider to stay at a healthy body weight or lose weight. Ask what the best weight range is for you. On most days of the week, get at least 30 minutes of exercise that causes your heart to beat faster. This may include walking, swimming, or biking. Work with your provider or dietitian to adjust your eating plan to meet your specific calorie needs. What foods should I eat? Fruits All fresh, dried, or frozen fruit. Canned fruits that are in their natural juice and do not have sugar added to them. Vegetables Fresh or frozen vegetables that are raw, steamed, roasted, or grilled. Low-sodium or reduced-sodium tomato and vegetable juice. Low-sodium or reduced-sodium tomato sauce and tomato paste. Low-sodium or reduced-sodium canned vegetables. Grains Whole-grain or whole-wheat bread. Whole-grain or whole-wheat pasta. Brown rice. [place]il Madeira. Bulgur. Whole-grain and low-sodium cereals. Pita bread. Low-fat, low-sodium crackers. Whole-wheat flour tortillas. Meats and other proteins Skinless chicken or turkey. Ground chicken or turkey. Pork with fat trimmed off. Fish and seafood. Egg whites. Dried beans, peas, or lentils. Unsalted nuts, nut butters, and seeds. Unsalted canned beans. Lean cuts of beef with fat trimmed off. Low-sodium, lean precooked or cured meat, such as sausages or meat loaves. Dairy Low-fat (1%) or fat-free (skim) milk. Reduced-fat, low-fat, or fat-free cheeses. Nonfat, low-sodium ricotta or cottage cheese. Low-fat or nonfat yogurt. Low-fat, low-sodium cheese. Fats and oils Soft margarine without trans fats. Vegetable oil. Reduced-fat, low-fat, or light mayonnaise and salad dressings (reduced-sodium). Canola, safflower, olive,  avocado, soybean, and sunflower oils. Avocado. Seasonings and condiments Herbs. Spices. Seasoning mixes without salt. Other foods Unsalted popcorn and pretzels. Fat-free sweets. The items listed above may not be all the foods and drinks you can have. Talk to a dietitian to learn more. What foods should I avoid? Fruits Canned fruit in a light or heavy syrup. Fried fruit. Fruit in cream or butter sauce. Vegetables Creamed or fried vegetables. Vegetables in a cheese sauce. Regular canned vegetables that are not marked as low-sodium or reduced-sodium. Regular canned tomato sauce and paste that are not marked as low-sodium or reduced-sodium. Regular tomato and vegetable juices  that are not marked as low-sodium or reduced-sodium. Dene. Olives. Grains Baked goods made with fat, such as croissants, muffins, or some breads. Dry pasta or rice meal packs. Meats and other proteins Fatty cuts of meat. Ribs. Fried meat. [place]. Bologna, salami, and other precooked or cured meats, such as sausages or meat loaves, that are not lean and low in sodium. Fat from the back of a pig (fatback). Bratwurst. Salted nuts and seeds. Canned beans with added salt. Canned or smoked fish. Whole eggs or egg yolks. Chicken or turkey with skin. Dairy Whole or 2% milk, cream, and half-and-half. Whole or full-fat cream cheese. Whole-fat or sweetened yogurt. Full-fat cheese. Nondairy creamers. Whipped toppings. Processed cheese and cheese spreads. Fats and oils Butter. Stick margarine. Lard. Shortening. Ghee. Bacon fat. Tropical oils, such as coconut, palm kernel, or palm oil. Seasonings and condiments Onion salt, garlic salt, seasoned salt, table salt, and sea salt. Worcestershire sauce. Tartar sauce. Barbecue sauce. Teriyaki sauce. Soy sauce, including reduced-sodium soy sauce. Steak sauce. Canned and packaged gravies. Fish sauce. Oyster sauce. Cocktail sauce. Store-bought horseradish. Ketchup. Mustard. Meat flavorings and  tenderizers. Bouillon cubes. Hot sauces. Pre-made or packaged marinades. Pre-made or packaged taco seasonings. Relishes. Regular salad dressings. Other foods Salted popcorn and pretzels. The items listed above may not be all the foods and drinks you should avoid. Talk to a dietitian to learn more. Where to find more information National Heart, Lung, and Blood Institute (NHLBI): buffalodrycleaner.gl American Heart Association (AHA): heart.org Academy of Nutrition and Dietetics: eatright.org National Kidney Foundation (NKF): kidney.org This information is not intended to replace advice given to you by your health care provider. Make sure you discuss any questions you have with your health care provider. Document Revised: 06/24/2022 Document Reviewed: 06/24/2022 Elsevier Patient Education  2024 [employer].   Fall Prevention in the Home, Adult Falls can cause injuries and affect people of all ages. There are many simple things that you can do to make your home safe and to help prevent falls. If you need it, ask for help making these changes. What actions can I take to prevent falls? General information Use good lighting in all rooms. Make sure to: Replace any light bulbs that burn out. Turn on lights if it is dark and use night-lights. Keep items that you use often in easy-to-reach places. Lower the shelves around your home if needed. Move furniture so that there are clear paths around it. Do not keep throw rugs or other things on the floor that can make you trip. If any of your floors are uneven, fix them. Add color or contrast paint or tape to clearly mark and help you see: Grab bars or handrails. First and last steps of staircases. Where the edge of each step is. If you use a ladder or stepladder: Make sure that it is fully opened. Do not climb a closed ladder. Make sure the sides of the ladder are locked in place. Have someone hold the ladder while you use it. Know where your pets are  as you move through your home. What can I do in the bathroom?     Keep the floor dry. Clean up any water  that is on the floor right away. Remove soap buildup in the bathtub or shower. Buildup makes bathtubs and showers slippery. Use non-skid mats or decals on the floor of the bathtub or shower. Attach bath mats securely with double-sided, non-slip rug tape. If you need to sit down while you are in the  shower, use a non-slip stool. Install grab bars by the toilet and in the bathtub and shower. Do not use towel bars as grab bars. What can I do in the bedroom? Make sure that you have a light by your bed that is easy to reach. Do not use any sheets or blankets on your bed that hang to the floor. Have a firm bench or chair with side arms that you can use for support when you get dressed. What can I do in the kitchen? Clean up any spills right away. If you need to reach something above you, use a sturdy step stool that has a grab bar. Keep electrical cables out of the way. Do not use floor polish or wax that makes floors slippery. What can I do with my stairs? Do not leave anything on the stairs. Make sure that you have a light switch at the top and the bottom of the stairs. Have them installed if you do not have them. Make sure that there are handrails on both sides of the stairs. Fix handrails that are broken or loose. Make sure that handrails are as long as the staircases. Install non-slip stair treads on all stairs in your home if they do not have carpet. Avoid having throw rugs at the top or bottom of stairs, or secure the rugs with carpet tape to prevent them from moving. Choose a carpet design that does not hide the edge of steps on the stairs. Make sure that carpet is firmly attached to the stairs. Fix any carpet that is loose or worn. What can I do on the outside of my home? Use bright outdoor lighting. Repair the edges of walkways and driveways and fix any cracks. Clear paths of  anything that can make you trip, such as tools or rocks. Add color or contrast paint or tape to clearly mark and help you see high doorway thresholds. Trim any bushes or trees on the main path into your home. Check that handrails are securely fastened and in good repair. Both sides of all steps should have handrails. Install guardrails along the edges of any raised decks or porches. Have leaves, snow, and ice cleared regularly. Use sand, salt, or ice melt on walkways during winter months if you live where there is ice and snow. In the garage, clean up any spills right away, including grease or oil spills. What other actions can I take? Review your medicines with your health care provider. Some medicines can make you confused or feel dizzy. This can increase your chance of falling. Wear closed-toe shoes that fit well and support your feet. Wear shoes that have rubber soles and low heels. Use a cane, walker, scooter, or crutches that help you move around if needed. Talk with your provider about other ways that you can decrease your risk of falls. This may include seeing a physical therapist to learn to do exercises to improve movement and strength. Where to find more information Centers for Disease Control and Prevention, STEAD[employer]: tonerpromos.no [employer] on Aging: baseringtones.pl National Institute on Aging: baseringtones.pl Contact a health care provider if: You are afraid of falling at home. You feel weak, drowsy, or dizzy at home. You fall at home. Get help right away if you: Lose consciousness or have trouble moving after a fall. Have a fall that causes a head injury. These symptoms may be an emergency. Get help right away. Call 911. Do not wait to see if the symptoms will go away. Do  not drive yourself to the hospital. This information is not intended to replace advice given to you by your health care provider. Make sure you discuss any questions you have with your health care  provider. Document Revised: 02/08/2022 Document Reviewed: 02/08/2022 Elsevier Patient Education  2024 Elsevier Inc.   Prediabetes: What to Know Prediabetes is when your blood sugar, also called glucose, is at a higher level than normal but not high enough for you to be diagnosed with type 2 diabetes (type 2 diabetes mellitus). Having prediabetes puts you at risk for getting type 2 diabetes. By making some healthy changes, you may be able to prevent or delay getting type 2 diabetes. This is important because type 2 diabetes can lead to serious problems. Some of these include: Heart disease. Stroke. Blindness. Kidney disease. Depression. Poor blood flow in the feet and legs. In very bad cases, this could lead to having a leg removed by surgery (amputation). What are the causes? The exact cause of prediabetes isn't known. It may result from insulin resistance. Insulin resistance happens when cells in the body don't respond properly to insulin that the body makes. This can cause too much sugar to build up in the blood. High blood sugar, also called hyperglycemia, can develop. What increases the risk? Having a family member with type 2 diabetes. Being older than 64 years of age. Having had a temporary form of diabetes during a pregnancy. This is called gestational diabetes. Having had polycystic ovary syndrome (PCOS). Being overweight or obese. Being inactive and not getting much exercise. Having a history of heart disease. This may include problems with cholesterol levels, high levels of blood fats, or high blood pressure. What are the signs or symptoms? You may have no symptoms. If you do have symptoms, they may include: Increased hunger. Increased thirst. Needing to pee more often. Changes in how you see, like blurry vision. Feeling tired. How is this diagnosed? Prediabetes can be diagnosed with blood tests that check your blood sugar. One or more of these tests may be done: A fasting  blood glucose (FBG) test. You won't be allowed to eat (you will fast) for at least 8 hours before a blood sample is taken. An A1C blood test, also called a hemoglobin A1C test. This test shows information about blood sugar levels over the past 2?3 months. An oral glucose tolerance test (OGTT). This test measures your blood sugar at two points in time: After you haven't eaten for a while. This is your baseline level. Two hours after you drink a beverage that has sugar in it. You may be diagnosed with prediabetes if: Your FBG is 100?125 mg/dL (4.3-3.0 mmol/L). Your A1C level is 5.7?6.4% (39-46 mmol/mol). Your OGTT result is 140?199 mg/dL (2.1-88 mmol/L). These blood tests may need to be done again to be sure of the diagnosis. How is this treated? Treatment may include making changes to your diet and lifestyle. These changes can help lower your blood sugar and keep you from getting type 2 diabetes. In some cases, medicine may be given to help lower your risk. Follow these instructions at home: Eating and drinking  Eat and drink as told. Follow a healthy meal plan. This includes eating lean proteins, whole grains, legumes, fresh fruits and vegetables, low-fat dairy products, and healthy fats. Meet with an expert in healthy eating called a dietitian. This person can help create a healthy eating plan that's right for you. Lifestyle Do moderate-intensity exercise. Do this for at least 30 minutes  a day on 5 or more days each week, or as told by your health care provider. A mix of activities may be best. Good choices include brisk walking, swimming, biking, and weight lifting. Try to lose weight if your provider says it's OK. Losing 5-7% of your body weight can help reverse insulin resistance. Do not drink alcohol if: Your provider tells you not to drink. You're pregnant, may be pregnant, or plan to become pregnant. If you drink alcohol: Limit how much you have to: 0-1 drink a day if you're  female. 0-2 drinks a day if you're female. Know how much alcohol is in your drink. In the [place] one drink is one 12 oz bottle of beer (355 mL), one 5 oz glass of wine (148 mL), or one 1 oz glass of hard liquor (44 mL). General instructions Take medicines only as told. You may be given medicines that help lower the risk of type 2 diabetes. Do not smoke, vape, or use nicotine or tobacco. Where to find more information American Diabetes Association: diabetes.org/about-diabetes/prediabetes Academy of Nutrition and Dietetics: eatright.org American Heart Association: Go to thisjobs.cz. Click the search icon. Type prediabetes in the search box. Contact a health care provider if: You have any of these symptoms: Increased hunger. Peeing more often than usual. Increased thirst. Feeling tired. Changes in how you see, like blurry vision. Feeling like you may throw up. Throwing up. Get help right away if: You have shortness of breath. You feel confused. This information is not intended to replace advice given to you by your health care provider. Make sure you discuss any questions you have with your health care provider. Document Revised: 01/09/2023 Document Reviewed: 01/09/2023 Elsevier Patient Education  2024 Elsevier Inc.   Chronic Migraine Headache A migraine is a type of headache that is usually stronger and more sudden than other headaches. This type of headache feels like an intense pulsing or throbbing pain that is usually felt on one side of the head. It may also cause other symptoms, such as nausea, vomiting, and sensitivity to light and noise. A migraine is called a chronic migraine if it happens at least 15 days in a month for more than 3 months. Talk with your health care provider about what things may bring on (trigger) your migraines. What are the causes? The exact cause is not known. However, a migraine may be caused when nerves in the brain become irritated and release chemicals  that cause blood vessels to become inflamed. This inflammation causes pain. Migraines may be triggered or caused by: Smoking. Medicines, such as birth control pills or some blood pressure medicines. Foods or drinks that contain nitrates, glutamate, aspartame, MSG, or tyramine. Certain foods or drinks, such as aged cheeses, chocolate, alcohol, or caffeine . Other triggers may include: Menstruation. Emotional stress. Getting too much or too little sleep. Tiredness (fatigue). Bright lights or loud noises. Certain smells. Weather changes and high altitude. What increases the risk? The following factors may make you more likely to have chronic migraines: Having migraines or a family history of migraines. Having a mental health condition, such as depression or anxiety. Taking a lot of pain medicine. Having sleep problems. Having heart disease, diabetes, or obesity. What are the signs or symptoms? Symptoms of a migraine vary for each person. The pain may: Be pulsing or throbbing. Happen on one side of the head. In some cases, the pain may be on both sides of the head or around the head or neck. Be so  bad that it prevents you from doing daily activities. Get worse with physical activity. Cause nausea, vomiting, or both. Get worse around bright lights, loud noises, or smells. Cause dizziness. A sign that your migraines are becoming chronic is when you start to have more and more migraine episodes. How is this diagnosed? Chronic migraines are often diagnosed based on: Your symptoms and medical history. A physical exam. You may also have tests, including: A CT scan or an MRI of your brain. These tests can help to rule out other causes of your headaches. Taking fluid from the spine (lumbar puncture) to examine it (cerebrospinal fluid analysis, or CSF analysis). Blood tests. How is this treated? Chronic migraines are treated with medicines that: Lessen pain and nausea. Prevent  migraines. Treatment may also include: Acupuncture. Lifestyle changes, such as changes to your diet or sleep schedule. Learning ways to control your body and breathing (biofeedback) and relaxation training. Talk therapy to help you know and deal with negative thoughts (cognitive behavioral therapy). Using a device that provides electrical stimulation to your nerves, which can relieve pain (neuromodulation therapy). Injection of medicine into the muscles of the face or head. Surgery, if the other treatments do not work. Follow these instructions at home: Medicines Take over-the-counter and prescription medicines only as told by your provider. Ask your provider if the medicine prescribed to you requires you to avoid driving or using machinery. Lifestyle  Do not drink alcohol. Do not use any products that contain nicotine or tobacco. These products include cigarettes, chewing tobacco, and vaping devices, such as e-cigarettes. If you need help quitting, ask your provider. Get 7-9 hours of sleep every night, or the amount of sleep recommended by your provider. Find ways to manage stress, such as meditation, deep breathing, or yoga. Maintain a healthy weight. If you need help losing weight, ask your provider. Exercise regularly. Aim for 150 minutes of moderate-intensity exercise, such as walking, biking, or yoga, or 75 minutes of vigorous exercise each week. Vigorous exercise includes running, circuit training, and swimming. General instructions Keep a journal to find out what triggers your migraines so you can avoid those things. For example, write down: What you eat and drink. How much sleep you get. Any change to your diet or medicines. If you have a migraine headache: Lie down in a dark, quiet room. Try placing a cool towel over your head. Keep lights dim if bright lights bother you or make your migraine worse. Where to find more information Coalition for Headache and Migraine Patients  (CHAMP): headachemigraine.org American Migraine Foundation: americanmigrainefoundation.org National Headache Foundation: headaches.org Contact a health care provider if: Your pain does not get better even with medicine. Your migraines keep coming back even with medicine. Get help right away if: Your migraine becomes severe and medicine does not help. You have a fever or stiff neck. You have vision loss. Your muscles feel weak or like you cannot control them. You lose your balance often or have trouble walking. You feel like you may faint or you faint. You start having sudden and unexpected, severe headaches. You have a seizure. This information is not intended to replace advice given to you by your health care provider. Make sure you discuss any questions you have with your health care provider. Document Revised: 02/01/2022 Document Reviewed: 02/01/2022 Elsevier Patient Education  2024 [employer].   Advance Directive  Advance directives are legal papers that state your wishes about health care decisions. They let your wishes be known to  family, friends, and health care providers if you become unable to speak for yourself.  You should write these papers out over time rather than all at once. They can be changed and updated at any time. The types of advance directives include: Medical power of attorney (POA). Living wills. Do not resuscitate (DNR) or do not attempt resuscitation (DNAR) orders. What are a health care proxy and medical POA? A health care proxy is also called a health care agent. It's a person you choose to make medical decisions for you when you can't make them for yourself. In most cases, a proxy is a trusted friend or family member. A medical POA is legal paperwork that names your proxy. It may need to be: Signed. Notarized. Dated. Copied. Witnessed. Added to your medical record. You may also want to choose someone to handle your money if you can't do so. This  is called a durable POA for finances. It's separate from a medical POA. You may choose your health care proxy or someone else to act as your agent in money matters. If you don't have a proxy, or if the proxy may not be acting in your best interest, a court may choose a guardian to act on your behalf. What is a living will? A living will is legal paperwork that states your wishes about medical care. Providers should keep a copy of it in your medical record. You may want to give a copy to family members or friends. You can also keep a card in your wallet to let loved ones know you have a living will and where they can find it. A living will may be used if: You're very sick with something that will end your life. You become disabled. You can't make decisions or speak for yourself. Your living will should include whether: To use or not use life support equipment. This may include machines to filter your blood or to help you breathe. You want a DNR or DNAR order. This tells providers not to use CPR if your heart or breathing stops. To use or not use tube feeding. You want to be given foods and fluids. You want a type of comfort care called palliative care. This may be given when the goal for treatment becomes comfort rather than a cure. You want to donate your organs and tissues. A living will doesn't say what to do with your money and property if you pass away. What is a DNR or DNAR? A DNR or DNAR order is a request not to have CPR. If you don't have one of these orders, a provider will try to help you if your heart stops or you stop breathing.  If you plan to have surgery, talk with your provider about your DNR or DNAR order. What happens if I don't have an advance directive? Each state has its own laws about advance directives. Some states assign family decision makers to act on your behalf if you don't have an advance directive.  Check with your provider, attorney, or state representative about  the laws in your state. Where to find more information Each state has its own laws about advance directives. You can look up these laws at: https://rodriguez-phillips.com/ This information is not intended to replace advice given to you by your health care provider. Make sure you discuss any questions you have with your health care provider. Document Revised: 10/25/2022 Document Reviewed: 10/25/2022 Elsevier Patient Education  2024 [employer].

## 2024-06-18 NOTE — Telephone Encounter (Signed)
" ° °  I submitted the tried and failed meds and was still denied-will attempt to resubmit.   Pharmacy Patient Advocate Encounter   Received notification from CoverMyMeds that prior authorization for Em[employer]  is required/requested.   Insurance verification completed.   The patient is insured through [employer].   Per test claim: PA required; PA started via CoverMyMeds. KEY [id number] . Waiting for clinical questions to populate. "

## 2024-06-25 ENCOUNTER — Telehealth: Payer: Self-pay | Admitting: Pharmacist

## 2024-06-25 NOTE — Telephone Encounter (Signed)
 Noted

## 2024-06-25 NOTE — Telephone Encounter (Signed)
 Appeal has been submitted. Will advise when response is received, please be advised that most companies may take 30 days to make a decision. Appeal letter and supporting documentation have been faxed to [phone] on 06/25/2024 @4 :38 pm.  Thank you, Devere Pandy, PharmD Clinical Pharmacist  [place]  Direct Dial: [phone]

## 2024-06-27 ENCOUNTER — Other Ambulatory Visit (HOSPITAL_COMMUNITY): Payer: Self-pay

## 2024-06-27 NOTE — Telephone Encounter (Signed)
 Insurance has approved the appeal for Emgality , full letter can be found under the media tab.    Patient notified.

## 2024-07-06 ENCOUNTER — Other Ambulatory Visit: Payer: Self-pay

## 2024-07-06 NOTE — Patient Instructions (Addendum)
 Visit Information  Rachael Brewer was given information about Medicaid Managed Care team care coordination services as a part of their [place] [employer] benefit.   If you would like to schedule transportation through your [place] [employer] plan, please call the following number at least 2 days in advance of your appointment: [phone].   There is no limit to the number of trips during the year between medical appointments, healthcare facilities, or pharmacies. Transportation must be scheduled at least 2 business days before but not more than thirty 30 days before of your appointment.  Call the Behavioral Health Crisis Line at [phone], at any time, 24 hours a day, 7 days a week. If you are in danger or need immediate medical attention call 911.   Please see education materials related to HTN, Depression, Anxiety, Advance Directives provided as print materials.  The patient verbalized understanding of instructions, educational materials, and care plan provided today and agreed to receive a mailed copy of patient instructions, educational materials, and care plan.   Telephone follow up appointment with Managed Medicaid care management team member scheduled for: 08/03/2024 at 1:00 pm  Rachael Duos, MSN, RN [place]  [facility], [facility] RN Care Manager Direct Dial: [phone] Fax: [phone]'  Following is a copy of your plan of care:   Goals Addressed             This Visit's Progress    [facility] RN Care Plan - HTN   On track    Problems:  Chronic Disease Management support and education needs related to HTN  Goal: Over the next 90 days the Patient will attend all scheduled medical appointments: Patient will att end all appointments as evidenced by chart review        continue to work with RN Care Manager and/or Social Worker to address care management and care coordination needs related to HTN as evidenced by adherence to care  management team scheduled appointments     take all medications exactly as prescribed and will call provider for medication related questions as evidenced by patient report    verbalize basic understanding of HTN disease process and self health management plan as evidenced by taking medications as prescribed, promptly reporting concerns to provider, check BP at least 3x week and record, bring to PCP appointments, lifestyle modifications including diet and exercise, stress management (continue to see therapist, see family therapist, consider psychiatrist for med management)  Interventions:   Hypertension Interventions: Last practice recorded BP readings:  BP Readings from Last 3 Encounters:  05/30/24 (!) 157/79  05/01/24 (!) 153/73  01/26/24 119/79   Most recent eGFR/CrCl:  Lab Results  Component Value Date   EGFR 65 05/01/2024    No components found for: CRCL  Provided education to patient re: stroke prevention, s/s of heart attack and stroke Reviewed medications with patient and discussed importance of compliance Counseled on the importance of exercise goals with target of 150 minutes per week Discussed plans with patient for ongoing care management follow up and provided patient with direct contact information for care management team Advised patient, providing education and rationale, to monitor blood pressure daily and record, calling PCP for findings outside established parameters Provided education on prescribed diet hearty healthy/low carb (pre-DM) Discussed complications of poorly controlled blood pressure such as heart disease, stroke, circulatory complications, vision complications, kidney impairment, sexual dysfunction Screening for signs and symptoms of depression related to chronic disease state  Assessed social determinant of health barriers  Patient Self-Care Activities:  Attend all scheduled provider appointments Call pharmacy for medication refills 3-7 days in  advance of running out of medications Call provider office for new concerns or questions  Take medications as prescribed   check blood pressure 3 times per week - Reminded to check and record learn about high blood pressure - resending by Mail keep a blood pressure log take blood pressure log to all doctor appointments call doctor for signs and symptoms of high blood pressure keep all doctor appointments begin an exercise program report new symptoms to your doctor eat more whole grains, fruits and vegetables, lean meats and healthy fats limit salt intake Stress management - discuss techniques with therapist Stretching daily to help manage migraines, call Neuro if no improvement with Emgality  - Will start Emgality  (required PA)  Plan:  Telephone follow up appointment with care management team member scheduled for:  08/03/2024 at 1:00 pm The patient has been provided with contact information for the care management team and has been advised to call with any health related questions or concerns.             Managing Anxiety, Adult After being diagnosed with anxiety, you may be relieved to know why you have felt or behaved a certain way. You may also feel overwhelmed about the treatment ahead and what it will mean for your life. With care and support, you can manage your anxiety. How to manage lifestyle changes Understanding the difference between stress and anxiety Although stress can play a role in anxiety, it is not the same as anxiety. Stress is your body's reaction to life changes and events, both good and bad. Stress is often caused by something external, such as a deadline, test, or competition. It normally goes away after the event has ended and will last just a few hours. But, stress can be ongoing and can lead to more than just stress. Anxiety is caused by something internal, such as imagining a terrible outcome or worrying that something will go wrong that will greatly upset  you. Anxiety often does not go away even after the event is over, and it can become a long-term (chronic) worry. Lowering stress and anxiety Talk with your health care provider or a counselor to learn more about lowering anxiety and stress. They may suggest tension-reduction techniques, such as: Music. Spend time creating or listening to music that you enjoy and that inspires you. Mindfulness-based meditation. Practice being aware of your normal breaths while not trying to control your breathing. It can be done while sitting or walking. Centering prayer. Focus on a word, phrase, or sacred image that means something to you and brings you peace. Deep breathing. Expand your stomach and inhale slowly through your nose. Hold your breath for 3-5 seconds. Then breathe out slowly, letting your stomach muscles relax. Self-talk. Learn to notice and spot thought patterns that lead to anxiety reactions. Change those patterns to thoughts that feel peaceful. Muscle relaxation. Take time to tense muscles and then relax them. Choose a tension-reduction technique that fits your lifestyle and personality. These techniques take time and practice. Set aside 5-15 minutes a day to do them. Specialized therapists can offer counseling and training in these techniques. The training to help with anxiety may be covered by some insurance plans. Other things you can do to manage stress and anxiety include: Keeping a stress diary. This can help you learn what triggers your reaction and then learn ways to manage your response. Thinking about  how you react to certain situations. You may not be able to control everything, but you can control your response. Making time for activities that help you relax and not feeling guilty about spending your time in this way. Doing visual imagery. This involves imagining or creating mental pictures to help you relax. Practicing yoga. Through yoga poses, you can lower tension and  relax.  Medicines Medicines for anxiety include: Antidepressant medicines. These are usually prescribed for long-term daily control. Anti-anxiety medicines. These may be added in severe cases, especially when panic attacks occur. When used together, medicines, psychotherapy, and tension-reduction techniques may be the most effective treatment. Relationships Relationships can play a big part in helping you recover. Spend more time connecting with trusted friends and family members. Think about going to couples counseling if you have a partner, taking family education classes, or going to family therapy. Therapy can help you and others better understand your anxiety. How to recognize changes in your anxiety Everyone responds differently to treatment for anxiety. Recovery from anxiety happens when symptoms lessen and stop interfering with your daily life at home or work. This may mean that you will start to: Have better concentration and focus. Worry will interfere less in your daily thinking. Sleep better. Be less irritable. Have more energy. Have improved memory. Try to recognize when your condition is getting worse. Contact your provider if your symptoms interfere with home or work and you feel like your condition is not improving. Follow these instructions at home: Activity Exercise. Adults should: Exercise for at least 150 minutes each week. The exercise should increase your heart rate and make you sweat (moderate-intensity exercise). Do strengthening exercises at least twice a week. Get the right amount and quality of sleep. Most adults need 7-9 hours of sleep each night. Lifestyle  Eat a healthy diet that includes plenty of vegetables, fruits, whole grains, low-fat dairy products, and lean protein. Do not eat a lot of foods that are high in fats, added sugars, or salt (sodium). Make choices that simplify your life. Do not use any products that contain nicotine or tobacco. These  products include cigarettes, chewing tobacco, and vaping devices, such as e-cigarettes. If you need help quitting, ask your provider. Avoid caffeine , alcohol, and certain over-the-counter cold medicines. These may make you feel worse. Ask your pharmacist which medicines to avoid. General instructions Take over-the-counter and prescription medicines only as told by your provider. Keep all follow-up visits. This is to make sure you are managing your anxiety well or if you need more support. Where to find support You can get help and support from: Self-help groups. Online and [employer]. A trusted spiritual leader. Couples counseling. Family education classes. Family therapy. Where to find more information You may find that joining a support group helps you deal with your anxiety. The following sources can help you find counselors or support groups near you: Mental Health America: mentalhealthamerica.net Anxiety and Depression Association of America (ADAA): adaa.org The First American on Mental Illness (NAMI): nami.org Contact a health care provider if: You have a hard time staying focused or finishing tasks. You spend many hours a day feeling worried about everyday life. You are very tired because you cannot stop worrying. You start to have headaches or often feel tense. You have chronic nausea or diarrhea. Get help right away if: Your heart feels like it is racing. You have shortness of breath. You have thoughts of hurting yourself or others. Get help right away if you feel like  you may hurt yourself or others, or have thoughts about taking your own life. Go to your nearest emergency room or: Call 911. Call the National Suicide Prevention Lifeline at (905)695-6220 or 988. This is open 24 hours a day. Text the Crisis Text Line at (256)281-6518. This information is not intended to replace advice given to you by your health care provider. Make sure you discuss any questions you have  with your health care provider. Document Revised: 03/16/2022 Document Reviewed: 09/28/2020 Elsevier Patient Education  2024 Elsevier Inc.    Managing Depression, Adult Depression is a mental health condition that affects your thoughts, feelings, and actions. Being diagnosed with depression can bring you relief if you did not know why you have felt or behaved a certain way. It could also leave you feeling overwhelmed. Finding ways to manage your symptoms can help you feel more positive about your future. How to manage lifestyle changes Being depressed is difficult. Depression can increase the level of everyday stress. Stress can make depression symptoms worse. You may believe your symptoms cannot be managed or will never improve. However, there are many things you can try to help manage your symptoms. There is hope. Managing stress  Stress is your body's reaction to life changes and events, both good and bad. Stress can add to your feelings of depression. Learning to manage your stress can help lessen your feelings of depression. Try some of the following approaches to reducing your stress (stress reduction techniques): Listen to music that you enjoy and that inspires you. Try using a meditation app or take a meditation class. Develop a practice that helps you connect with your spiritual self. Walk in nature, pray, or go to a place of worship. Practice deep breathing. To do this, inhale slowly through your nose. Pause at the top of your inhale for a few seconds and then exhale slowly, letting yourself relax. Repeat this three or four times. Practice yoga to help relax and work your muscles. Choose a stress reduction technique that works for you. These techniques take time and practice to develop. Set aside 5-15 minutes a day to do them. Therapists can offer training in these techniques. Do these things to help manage stress: Keep a journal. Know your limits. Set healthy boundaries for yourself  and others, such as saying no when you think something is too much. Pay attention to how you react to certain situations. You may not be able to control everything, but you can change your reaction. Add humor to your life by watching funny movies or shows. Make time for activities that you enjoy and that relax you. Spend less time using electronics, especially at night before bed. The light from screens can make your brain think it is time to get up rather than go to bed.  Medicines Medicines, such as antidepressants, are often a part of treatment for depression. Talk with your pharmacist or health care provider about all the medicines, supplements, and herbal products that you take, their possible side effects, and what medicines and other products are safe to take together. Make sure to report any side effects you may have to your health care provider. Relationships Your health care provider may suggest family therapy, couples therapy, or individual therapy as part of your treatment. How to recognize changes Everyone responds differently to treatment for depression. As you recover from depression, you may start to: Have more interest in doing activities. Feel more hopeful. Have more energy. Eat a more regular amount of  food. Have better mental focus. It is important to recognize if your depression is not getting better or is getting worse. The symptoms you had in the beginning may return, such as: Feeling tired. Eating too much or too little. Sleeping too much or too little. Feeling restless, agitated, or hopeless. Trouble focusing or making decisions. Having unexplained aches and pains. Feeling irritable, angry, or aggressive. If you or your family members notice these symptoms coming back, let your health care provider know right away. Follow these instructions at home: Activity Try to get some form of exercise each day, such as walking. Try yoga, mindfulness, or other stress  reduction techniques. Participate in group activities if you are able. Lifestyle Get enough sleep. Cut down on or stop using caffeine , tobacco, alcohol, and any other harmful substances. Eat a healthy diet that includes plenty of vegetables, fruits, whole grains, low-fat dairy products, and lean protein. Limit foods that are high in solid fats, added sugar, or salt (sodium). General instructions Take over-the-counter and prescription medicines only as told by your health care provider. Keep all follow-up visits. It is important for your health care provider to check on your mood, behavior, and medicines. Your health care provider may need to make changes to your treatment. Where to find support Talking to others  Friends and family members can be sources of support and guidance. Talk to trusted friends or family members about your condition. Explain your symptoms and let them know that you are working with a health care provider to treat your depression. Tell friends and family how they can help. Finances Find mental health providers that fit with your financial situation. Talk with your health care provider if you are worried about access to food, housing, or medicine. Call your insurance company to learn about your co-pays and prescription plan. Where to find more information You can find support in your area from: Anxiety and Depression Association of America (ADAA): adaa.org Mental Health America: mentalhealthamerica.net The First American on Mental Illness: nami.org Contact a health care provider if: You stop taking your antidepressant medicines, and you have any of these symptoms: Nausea. Headache. Light-headedness. Chills and body aches. Not being able to sleep (insomnia). You or your friends and family think your depression is getting worse. Get help right away if: You have thoughts of hurting yourself or others. Get help right away if you feel like you may hurt yourself or  others, or have thoughts about taking your own life. Go to your nearest emergency room or: Call 911. Call the National Suicide Prevention Lifeline at (985) 858-3544 or 988. This is open 24 hours a day. Text the Crisis Text Line at 8450408188. This information is not intended to replace advice given to you by your health care provider. Make sure you discuss any questions you have with your health care provider. Document Revised: 10/13/2021 Document Reviewed: 10/13/2021 Elsevier Patient Education  2024 Elsevier Inc.    Managing Your Hypertension Hypertension, also called high blood pressure, is when the force of the blood pressing against the walls of the arteries is too strong. Arteries are blood vessels that carry blood from your heart throughout your body. Hypertension forces the heart to work harder to pump blood and may cause the arteries to become narrow or stiff. Understanding blood pressure readings A blood pressure reading includes a higher number over a lower number: The first, or top, number is called the systolic pressure. It is a measure of the pressure in your arteries as your heart  beats. The second, or bottom number, is called the diastolic pressure. It is a measure of the pressure in your arteries as the heart relaxes. For most people, a normal blood pressure is below 120/80. Your personal target blood pressure may vary depending on your medical conditions, your age, and other factors. Blood pressure is classified into four stages. Based on your blood pressure reading, your health care provider may use the following stages to determine what type of treatment you need, if any. Systolic pressure and diastolic pressure are measured in a unit called millimeters of mercury (mmHg). Normal Systolic pressure: below 120. Diastolic pressure: below 80. Elevated Systolic pressure: 120-129. Diastolic pressure: below 80. Hypertension stage 1 Systolic pressure: 130-139. Diastolic pressure:  80-89. Hypertension stage 2 Systolic pressure: 140 or above. Diastolic pressure: 90 or above. How can this condition affect me? Managing your hypertension is very important. Over time, hypertension can damage the arteries and decrease blood flow to parts of the body, including the brain, heart, and kidneys. Having untreated or uncontrolled hypertension can lead to: A heart attack. A stroke. A weakened blood vessel (aneurysm). Heart failure. Kidney damage. Eye damage. Memory and concentration problems. Vascular dementia. What actions can I take to manage this condition? Hypertension can be managed by making lifestyle changes and possibly by taking medicines. Your health care provider will help you make a plan to bring your blood pressure within a normal range. You may be referred for counseling on a healthy diet and physical activity. Nutrition  Eat a diet that is high in fiber and potassium, and low in salt (sodium), added sugar, and fat. An example eating plan is called the DASH diet. DASH stands for Dietary Approaches to Stop Hypertension. To eat this way: Eat plenty of fresh fruits and vegetables. Try to fill one-half of your plate at each meal with fruits and vegetables. Eat whole grains, such as whole-wheat pasta, brown rice, or whole-grain bread. Fill about one-fourth of your plate with whole grains. Eat low-fat dairy products. Avoid fatty cuts of meat, processed or cured meats, and poultry with skin. Fill about one-fourth of your plate with lean proteins such as fish, chicken without skin, beans, eggs, and tofu. Avoid pre-made and processed foods. These tend to be higher in sodium, added sugar, and fat. Reduce your daily sodium intake. Many people with hypertension should eat less than 1,500 mg of sodium a day. Lifestyle  Work with your health care provider to maintain a healthy body weight or to lose weight. Ask what an ideal weight is for you. Get at least 30 minutes of exercise  that causes your heart to beat faster (aerobic exercise) most days of the week. Activities may include walking, swimming, or biking. Include exercise to strengthen your muscles (resistance exercise), such as weight lifting, as part of your weekly exercise routine. Try to do these types of exercises for 30 minutes at least 3 days a week. Do not use any products that contain nicotine or tobacco. These products include cigarettes, chewing tobacco, and vaping devices, such as e-cigarettes. If you need help quitting, ask your health care provider. Control any long-term (chronic) conditions you have, such as high cholesterol or diabetes. Identify your sources of stress and find ways to manage stress. This may include meditation, deep breathing, or making time for fun activities. Alcohol use Do not drink alcohol if: Your health care provider tells you not to drink. You are pregnant, may be pregnant, or are planning to become pregnant. If you  drink alcohol: Limit how much you have to: 0-1 drink a day for women. 0-2 drinks a day for men. Know how much alcohol is in your drink. In the [place] one drink equals one 12 oz bottle of beer (355 mL), one 5 oz glass of wine (148 mL), or one 1 oz glass of hard liquor (44 mL). Medicines Your health care provider may prescribe medicine if lifestyle changes are not enough to get your blood pressure under control and if: Your systolic blood pressure is 130 or higher. Your diastolic blood pressure is 80 or higher. Take medicines only as told by your health care provider. Follow the directions carefully. Blood pressure medicines must be taken as told by your health care provider. The medicine does not work as well when you skip doses. Skipping doses also puts you at risk for problems. Monitoring Before you monitor your blood pressure: Do not smoke, drink caffeinated beverages, or exercise within 30 minutes before taking a measurement. Use the bathroom and empty your  bladder (urinate). Sit quietly for at least 5 minutes before taking measurements. Monitor your blood pressure at home as told by your health care provider. To do this: Sit with your back straight and supported. Place your feet flat on the floor. Do not cross your legs. Support your arm on a flat surface, such as a table. Make sure your upper arm is at heart level. Each time you measure, take two or three readings one minute apart and record the results. You may also need to have your blood pressure checked regularly by your health care provider. General information Talk with your health care provider about your diet, exercise habits, and other lifestyle factors that may be contributing to hypertension. Review all the medicines you take with your health care provider because there may be side effects or interactions. Keep all follow-up visits. Your health care provider can help you create and adjust your plan for managing your high blood pressure. Where to find more information National Heart, Lung, and Blood Institute: popsteam.is American Heart Association: www.heart.org Contact a health care provider if: You think you are having a reaction to medicines you have taken. You have repeated (recurrent) headaches. You feel dizzy. You have swelling in your ankles. You have trouble with your vision. Get help right away if: You develop a severe headache or confusion. You have unusual weakness or numbness, or you feel faint. You have severe pain in your chest or abdomen. You vomit repeatedly. You have trouble breathing. These symptoms may be an emergency. Get help right away. Call 911. Do not wait to see if the symptoms will go away. Do not drive yourself to the hospital. Summary Hypertension is when the force of blood pumping through your arteries is too strong. If this condition is not controlled, it may put you at risk for serious complications. Your personal target blood pressure  may vary depending on your medical conditions, your age, and other factors. For most people, a normal blood pressure is less than 120/80. Hypertension is managed by lifestyle changes, medicines, or both. Lifestyle changes to help manage hypertension include losing weight, eating a healthy, low-sodium diet, exercising more, stopping smoking, and limiting alcohol. This information is not intended to replace advice given to you by your health care provider. Make sure you discuss any questions you have with your health care provider. Document Revised: 02/19/2021 Document Reviewed: 02/19/2021 Elsevier Patient Education  2024 [employer].    Advance Directive  Advance directives are  legal papers that state your wishes about health care decisions. They let your wishes be known to family, friends, and health care providers if you become unable to speak for yourself.  You should write these papers out over time rather than all at once. They can be changed and updated at any time. The types of advance directives include: Medical power of attorney (POA). Living wills. Do not resuscitate (DNR) or do not attempt resuscitation (DNAR) orders. What are a health care proxy and medical POA? A health care proxy is also called a health care agent. It's a person you choose to make medical decisions for you when you can't make them for yourself. In most cases, a proxy is a trusted friend or family member. A medical POA is legal paperwork that names your proxy. It may need to be: Signed. Notarized. Dated. Copied. Witnessed. Added to your medical record. You may also want to choose someone to handle your money if you can't do so. This is called a durable POA for finances. It's separate from a medical POA. You may choose your health care proxy or someone else to act as your agent in money matters. If you don't have a proxy, or if the proxy may not be acting in your best interest, a court may choose a guardian to  act on your behalf. What is a living will? A living will is legal paperwork that states your wishes about medical care. Providers should keep a copy of it in your medical record. You may want to give a copy to family members or friends. You can also keep a card in your wallet to let loved ones know you have a living will and where they can find it. A living will may be used if: You're very sick with something that will end your life. You become disabled. You can't make decisions or speak for yourself. Your living will should include whether: To use or not use life support equipment. This may include machines to filter your blood or to help you breathe. You want a DNR or DNAR order. This tells providers not to use CPR if your heart or breathing stops. To use or not use tube feeding. You want to be given foods and fluids. You want a type of comfort care called palliative care. This may be given when the goal for treatment becomes comfort rather than a cure. You want to donate your organs and tissues. A living will doesn't say what to do with your money and property if you pass away. What is a DNR or DNAR? A DNR or DNAR order is a request not to have CPR. If you don't have one of these orders, a provider will try to help you if your heart stops or you stop breathing.  If you plan to have surgery, talk with your provider about your DNR or DNAR order. What happens if I don't have an advance directive? Each state has its own laws about advance directives. Some states assign family decision makers to act on your behalf if you don't have an advance directive.  Check with your provider, attorney, or state representative about the laws in your state. Where to find more information Each state has its own laws about advance directives. You can look up these laws at: https://rodriguez-phillips.com/ This information is not intended to replace advice given to you by your health care provider. Make sure you discuss any questions  you have with your health care provider. Document Revised:  10/25/2022 Document Reviewed: 10/25/2022 Elsevier Patient Education  2024 [employer].

## 2024-07-06 NOTE — Patient Outreach (Signed)
 Complex Care Management   Visit Note  07/06/2024  Name:  Rachael Brewer MRN: [id number] DOB: 09/11/1960  Situation: Referral received for Complex Care Management related to HTN, Depression I obtained verbal consent from Patient.  Visit completed with Patient  on the phone  Background:   Past Medical History:  Diagnosis Date   Anxiety    Arthritis    Complication of anesthesia    CAUSES MIGRAINE   Depression    Diabetes (HCC)    Edema of both lower extremities    GERD (gastroesophageal reflux disease)    Headache    Menetrier disease    Migraines    Pre-diabetes    Sleep apnea    CPAP    Assessment: Patient Reported Symptoms:  Cognitive Cognitive Status: No symptoms reported, Struggling with memory recall (unchanged) Cognitive/Intellectual Conditions Management [RPT]: None reported or documented in medical history or problem list      Neurological Neurological Review of Symptoms: Headaches, Numbness Neurological Comment: made aware [facility]  approved and will call pharmacy to pick up  HEENT HEENT Symptoms Reported: No symptoms reported      Cardiovascular Cardiovascular Symptoms Reported: Swelling in legs or feet Does patient have uncontrolled Hypertension?: No Cardiovascular Management Strategies: Medication therapy, Routine screening Weight: 235 lb (106.6 kg) Cardiovascular Comment: extra torsemide  today (total 40 mg) fro swelling in legs, usually needs about 1x week with relief, not checking BP, checks weekly weight  Respiratory Respiratory Symptoms Reported: No symptoms reported Respiratory Management Strategies: CPAP, Routine screening, Medication therapy  Endocrine Endocrine Symptoms Reported: No symptoms reported Is patient diabetic?: No    Gastrointestinal Gastrointestinal Symptoms Reported: Not assessed      Genitourinary Genitourinary Symptoms Reported: Not assessed    Integumentary Integumentary Symptoms Reported: Not assessed    Musculoskeletal  Musculoskelatal Symptoms Reviewed: Back pain, Difficulty walking, Limited mobility Additional Musculoskeletal Details: npot needing cane recently, still back and hip pain, will try to use cane for walks, no falls Musculoskeletal Management Strategies: Routine screening, Medical device Falls in the past year?: Yes Number of falls in past year: 2 or more Was there an injury with Fall?: No Fall Risk Category Calculator: 2 Patient Fall Risk Level: Moderate Fall Risk Patient at Risk for Falls Due to: History of fall(s), Impaired mobility Fall risk Follow up: Falls evaluation completed, Falls prevention discussed  Psychosocial Psychosocial Symptoms Reported: Depression - if selected complete PHQ 2-9, Anxiety - if selected complete GAD Additional Psychological Details: no longer seeing own counselor regularly, getting scheduled with family couselor through son's counselor, would like in person counseling and continues to use AI counseling - discussed warnings using AI for counseling, encouraged and agreed to look around and find in person counselor, declined LCSW services Behavioral Management Strategies: Counseling, Medication therapy Major Change/Loss/Stressor/Fears (CP): Medical condition, self, Medical condition, family      07/06/2024    PHQ2-9 Depression Screening   Little interest or pleasure in doing things Nearly every day  Feeling down, depressed, or hopeless Nearly every day  PHQ-2 - Total Score 6  Trouble falling or staying asleep, or sleeping too much More than half the days  Feeling tired or having little energy Nearly every day  Poor appetite or overeating  Nearly every day  Feeling bad about yourself - or that you are a failure or have let yourself or your family down More than half the days  Trouble concentrating on things, such as reading the newspaper or watching television Nearly every day  Moving or  speaking so slowly that other people could have noticed.  Or the opposite -  being so fidgety or restless that you have been moving around a lot more than usual Not at all  Thoughts that you would be better off dead, or hurting yourself in some way Not at all  PHQ2-9 Total Score 19  If you checked off any problems, how difficult have these problems made it for you to do your work, take care of things at home, or get along with other people Very difficult  Depression Interventions/Treatment Currently on Treatment, Medication, Counseling (encouraged and patient agreed to re-engage with her counselor, will call today)    Today's Vitals   07/06/24 1113  Weight: 235 lb (106.6 kg)      Medications Reviewed Today     Reviewed by Devra Lands, RN (Registered Nurse) on 07/06/24 at 1112  Med List Status: <None>   Medication Order Taking? Sig Documenting Provider Last Dose Status Informant  albuterol  (VENTOLIN  HFA) 108 (90 Base) MCG/ACT inhaler [id number] Yes INHALE 1-2 PUFFS BY MOUTH EVERY 6 HOURS AS NEEDED FOR WHEEZE OR SHORTNESS OF BREATHINHALE 1-2 PUFFS BY MOUTH EVERY 6 HOURS AS NEEDED FOR WHEEZE OR SHORTNESS OF BREATH Bacigalupo, Angela M, MD  Active   aspirin -acetaminophen -caffeine  (EXCEDRIN  MIGRAINE) 250-250-65 MG tablet [id number] Yes Take 2 tablets by mouth every 8 (eight) hours as needed for migraine.  [provider]  Active Self           Med Note MARVIS LELA LOISE [place] Apr 27, 2018  8:30 AM)    Atogepant  (QULIPTA ) 60 MG TABS [id number]  Take 1 tablet (60 mg total) by mouth daily.  Patient not taking: Reported on 07/06/2024   Millikan, Megan, NP  Active   Azelastine  HCl 137 MCG/SPRAY SOLN [id number] Yes Place 1 spray into both nostrils at bedtime.  Patient taking differently: Place 1 spray into both nostrils at bedtime. As needed   [provider]  Active Self           Med Note MARVIS LELA LOISE [place] Apr 27, 2018  8:30 AM)    baclofen  (LIORESAL ) 10 MG tablet [id number] Yes TAKE 1 TABLET BY MOUTH THREE TIMES A DAY AS NEEDED FOR MUSCLE SPASM  Myrla Jon HERO, MD  Active   buPROPion  (WELLBUTRIN  XL) 150 MG 24 hr tablet [id number] Yes TAKE 1 TABLET BY MOUTH EVERY DAY Bacigalupo, Angela M, MD  Active   buPROPion  (WELLBUTRIN  XL) 300 MG 24 hr tablet [id number] Yes TAKE 1 TABLET BY MOUTH EVERY DAY Bacigalupo, Angela M, MD  Active   clobetasol  cream (TEMOVATE ) 0.05 % [id number] Yes Apply 1 Application topically 2 (two) times daily.  Patient taking differently: Apply 1 Application topically 2 (two) times daily. As needed for L neck rash   Myrla Jon HERO, MD  Active   cyanocobalamin  (VITAMIN B12) 1000 MCG tablet [id number] Yes Take 1,000 mcg by mouth daily. [provider]  Active   fluticasone  (FLONASE ) 50 MCG/ACT nasal spray [id number] Yes Place 2 sprays into both nostrils daily.  Patient taking differently: Place 2 sprays into both nostrils daily. As needed   Emilio Marseille T, FNP  Active   gabapentin  (NEURONTIN ) 600 MG tablet [id number] Yes Take 1 tablet (600 mg total) by mouth at bedtime. Millikan, Megan, NP  Active   Galcanezumab -gnlm (EMGALITY ) 120 MG/ML SOAJ [id number]  Inject 120 mg into the skin every 30 (thirty) days.  Patient not taking: Reported on 07/06/2024   Sherryl,  Megan, NP  Active   Melatonin 10 MG CAPS [id number] Yes Take 20 mg by mouth at bedtime.  Patient taking differently: Take 20 mg by mouth at bedtime. Takes as needed,   [provider]  Active   meloxicam (MOBIC) 15 MG tablet [id number] Yes Take 1 tablet by mouth daily. [provider]  Active   Multiple Vitamin (MULTIVITAMIN WITH MINERALS) TABS tablet [id number] Yes Take 1 tablet by mouth daily. [provider]  Active Self  mupirocin  ointment (BACTROBAN ) 2 % [id number] Yes Apply 1 Application topically 2 (two) times daily.  Patient taking differently: Apply 1 Application topically 2 (two) times daily. As needed for nose   Myrla Jon HERO, MD  Active   omeprazole  (PRILOSEC) 40 MG capsule [id number] Yes TAKE 1 CAPSULE (40 MG  TOTAL) BY MOUTH DAILY. Myrla Jon HERO, MD  Active   rizatriptan  (MAXALT ) 10 MG tablet [id number] Yes Take 10 mg by mouth every 2 (two) hours as needed for migraine (max 2 tablets/24 hrs.).  [provider]  Active Self           Med Note MARVIS LELA LOISE [place] Apr 27, 2018  8:30 AM)    sertraline  (ZOLOFT ) 100 MG tablet [id number] Yes Take 1 tablet (100 mg total) by mouth daily. Bacigalupo, Angela M, MD  Active   torsemide  (DEMADEX ) 20 MG tablet [id number] Yes TAKE 2 TABLETS (40 MG TOTAL) BY MOUTH DAILY.  Patient taking differently: Take 40 mg by mouth daily. Taking 1 tablet daily - 20 mg, will take 40 for swelling   Ostwalt, Janna, PA-C  Active             Recommendation:   PCP Follow-up Continue Current Plan of Care Contact counselor to schedule appointment  Follow Up Plan:   Telephone follow-up in 1 month  [name], MSN, RN [facility]  [facility], [facility] RN Care Manager Direct Dial: [phone] Fax: [phone]

## 2024-08-03 ENCOUNTER — Telehealth

## 2024-10-29 ENCOUNTER — Ambulatory Visit: Admitting: Family Medicine

## 2025-04-11 ENCOUNTER — Ambulatory Visit: Admitting: Adult Health
